# Patient Record
Sex: Female | Born: 1955
Health system: Southern US, Community
[De-identification: ages and names within clinical notes are randomized; demographics above are authoritative.]

## PROBLEM LIST (undated history)

## (undated) DIAGNOSIS — Z87442 Personal history of urinary calculi: Secondary | ICD-10-CM

## (undated) DIAGNOSIS — M3500 Sicca syndrome, unspecified: Secondary | ICD-10-CM

## (undated) DIAGNOSIS — E063 Autoimmune thyroiditis: Secondary | ICD-10-CM

## (undated) DIAGNOSIS — N200 Calculus of kidney: Secondary | ICD-10-CM

## (undated) DIAGNOSIS — M797 Fibromyalgia: Secondary | ICD-10-CM

## (undated) DIAGNOSIS — M069 Rheumatoid arthritis, unspecified: Secondary | ICD-10-CM

## (undated) DIAGNOSIS — IMO0002 Reserved for concepts with insufficient information to code with codable children: Secondary | ICD-10-CM

## (undated) DIAGNOSIS — G43909 Migraine, unspecified, not intractable, without status migrainosus: Secondary | ICD-10-CM

## (undated) DIAGNOSIS — U071 COVID-19: Secondary | ICD-10-CM

## (undated) DIAGNOSIS — E039 Hypothyroidism, unspecified: Secondary | ICD-10-CM

## (undated) DIAGNOSIS — K219 Gastro-esophageal reflux disease without esophagitis: Secondary | ICD-10-CM

## (undated) DIAGNOSIS — M329 Systemic lupus erythematosus, unspecified: Secondary | ICD-10-CM

## (undated) DIAGNOSIS — I729 Aneurysm of unspecified site: Secondary | ICD-10-CM

## (undated) DIAGNOSIS — R002 Palpitations: Secondary | ICD-10-CM

## (undated) DIAGNOSIS — R630 Anorexia: Secondary | ICD-10-CM

## (undated) DIAGNOSIS — G459 Transient cerebral ischemic attack, unspecified: Secondary | ICD-10-CM

## (undated) HISTORY — DX: Anorexia: R63.0

## (undated) HISTORY — DX: Aneurysm of unspecified site: I72.9

## (undated) HISTORY — PX: CHOLECYSTECTOMY: SHX55

## (undated) HISTORY — DX: Sjogren syndrome, unspecified: M35.00

## (undated) HISTORY — PX: ABDOMINAL HYSTERECTOMY: SHX81

## (undated) HISTORY — DX: COVID-19: U07.1

## (undated) HISTORY — DX: Fibromyalgia: M79.7

## (undated) HISTORY — DX: Reserved for concepts with insufficient information to code with codable children: IMO0002

## (undated) HISTORY — DX: Migraine, unspecified, not intractable, without status migrainosus: G43.909

## (undated) HISTORY — DX: Hypothyroidism, unspecified: E03.9

## (undated) HISTORY — DX: Autoimmune thyroiditis: E06.3

## (undated) HISTORY — DX: Rheumatoid arthritis, unspecified: M06.9

## (undated) HISTORY — DX: Systemic lupus erythematosus, unspecified: M32.9

## (undated) HISTORY — PX: OTHER SURGICAL HISTORY: SHX169

## (undated) HISTORY — DX: Calculus of kidney: N20.0

---

## 1988-06-03 HISTORY — PX: CERVICAL FUSION: SHX112

## 1999-09-04 ENCOUNTER — Encounter: Payer: Self-pay | Admitting: Family Medicine

## 1999-09-04 ENCOUNTER — Encounter: Admission: RE | Admit: 1999-09-04 | Discharge: 1999-09-04 | Payer: Self-pay | Admitting: Family Medicine

## 2000-05-02 ENCOUNTER — Encounter (INDEPENDENT_AMBULATORY_CARE_PROVIDER_SITE_OTHER): Payer: Self-pay

## 2000-05-02 ENCOUNTER — Other Ambulatory Visit: Admission: RE | Admit: 2000-05-02 | Discharge: 2000-05-02 | Payer: Self-pay | Admitting: Obstetrics & Gynecology

## 2000-09-24 ENCOUNTER — Encounter: Admission: RE | Admit: 2000-09-24 | Discharge: 2000-09-24 | Payer: Self-pay | Admitting: Family Medicine

## 2000-09-24 ENCOUNTER — Encounter: Payer: Self-pay | Admitting: Family Medicine

## 2000-11-05 ENCOUNTER — Other Ambulatory Visit: Admission: RE | Admit: 2000-11-05 | Discharge: 2000-11-05 | Payer: Self-pay | Admitting: Obstetrics & Gynecology

## 2000-11-05 ENCOUNTER — Encounter (INDEPENDENT_AMBULATORY_CARE_PROVIDER_SITE_OTHER): Payer: Self-pay

## 2000-12-24 ENCOUNTER — Encounter (INDEPENDENT_AMBULATORY_CARE_PROVIDER_SITE_OTHER): Payer: Self-pay

## 2000-12-24 ENCOUNTER — Observation Stay (HOSPITAL_COMMUNITY): Admission: RE | Admit: 2000-12-24 | Discharge: 2000-12-25 | Payer: Self-pay | Admitting: Obstetrics & Gynecology

## 2001-08-14 ENCOUNTER — Encounter: Admission: RE | Admit: 2001-08-14 | Discharge: 2001-08-14 | Payer: Self-pay | Admitting: Family Medicine

## 2001-08-14 ENCOUNTER — Encounter: Payer: Self-pay | Admitting: Family Medicine

## 2001-09-14 ENCOUNTER — Encounter: Admission: RE | Admit: 2001-09-14 | Discharge: 2001-10-28 | Payer: Self-pay | Admitting: Orthopedic Surgery

## 2002-03-30 ENCOUNTER — Encounter: Admission: RE | Admit: 2002-03-30 | Discharge: 2002-03-30 | Payer: Self-pay | Admitting: Neurology

## 2002-03-30 ENCOUNTER — Encounter: Payer: Self-pay | Admitting: Neurology

## 2002-06-03 HISTORY — PX: CERVICAL FUSION: SHX112

## 2002-06-03 HISTORY — PX: LUMBAR MICRODISCECTOMY: SHX99

## 2002-11-16 ENCOUNTER — Encounter: Payer: Self-pay | Admitting: Specialist

## 2002-11-18 ENCOUNTER — Encounter: Payer: Self-pay | Admitting: Specialist

## 2002-11-18 ENCOUNTER — Ambulatory Visit (HOSPITAL_COMMUNITY): Admission: RE | Admit: 2002-11-18 | Discharge: 2002-11-19 | Payer: Self-pay | Admitting: Specialist

## 2003-01-25 ENCOUNTER — Encounter: Payer: Self-pay | Admitting: Specialist

## 2003-01-25 ENCOUNTER — Ambulatory Visit (HOSPITAL_COMMUNITY): Admission: RE | Admit: 2003-01-25 | Discharge: 2003-01-26 | Payer: Self-pay | Admitting: Specialist

## 2003-08-16 ENCOUNTER — Encounter: Admission: RE | Admit: 2003-08-16 | Discharge: 2003-08-16 | Payer: Self-pay | Admitting: Specialist

## 2003-08-21 ENCOUNTER — Encounter: Admission: RE | Admit: 2003-08-21 | Discharge: 2003-08-21 | Payer: Self-pay | Admitting: Specialist

## 2003-08-29 ENCOUNTER — Encounter: Admission: RE | Admit: 2003-08-29 | Discharge: 2003-08-29 | Payer: Self-pay | Admitting: Rheumatology

## 2003-09-15 ENCOUNTER — Encounter (INDEPENDENT_AMBULATORY_CARE_PROVIDER_SITE_OTHER): Payer: Self-pay | Admitting: *Deleted

## 2003-09-15 ENCOUNTER — Ambulatory Visit (HOSPITAL_COMMUNITY): Admission: RE | Admit: 2003-09-15 | Discharge: 2003-09-15 | Payer: Self-pay | Admitting: Endocrinology

## 2003-11-25 ENCOUNTER — Encounter: Admission: RE | Admit: 2003-11-25 | Discharge: 2003-11-25 | Payer: Self-pay | Admitting: Neurology

## 2004-04-03 ENCOUNTER — Encounter: Admission: RE | Admit: 2004-04-03 | Discharge: 2004-04-03 | Payer: Self-pay | Admitting: Endocrinology

## 2004-05-09 ENCOUNTER — Ambulatory Visit (HOSPITAL_COMMUNITY): Admission: RE | Admit: 2004-05-09 | Discharge: 2004-05-09 | Payer: Self-pay | Admitting: Endocrinology

## 2004-06-14 ENCOUNTER — Encounter: Admission: RE | Admit: 2004-06-14 | Discharge: 2004-06-14 | Payer: Self-pay | Admitting: Otolaryngology

## 2004-07-09 ENCOUNTER — Ambulatory Visit (HOSPITAL_COMMUNITY): Admission: RE | Admit: 2004-07-09 | Discharge: 2004-07-09 | Payer: Self-pay | Admitting: *Deleted

## 2004-07-09 ENCOUNTER — Encounter: Payer: Self-pay | Admitting: Family Medicine

## 2004-07-16 ENCOUNTER — Encounter: Admission: RE | Admit: 2004-07-16 | Discharge: 2004-10-14 | Payer: Self-pay | Admitting: Family Medicine

## 2004-10-10 ENCOUNTER — Ambulatory Visit (HOSPITAL_COMMUNITY): Admission: RE | Admit: 2004-10-10 | Discharge: 2004-10-10 | Payer: Self-pay | Admitting: Neurology

## 2004-11-28 ENCOUNTER — Encounter: Admission: RE | Admit: 2004-11-28 | Discharge: 2004-11-28 | Payer: Self-pay | Admitting: Family Medicine

## 2005-02-11 ENCOUNTER — Encounter: Admission: RE | Admit: 2005-02-11 | Discharge: 2005-02-11 | Payer: Self-pay | Admitting: *Deleted

## 2005-11-11 ENCOUNTER — Encounter: Admission: RE | Admit: 2005-11-11 | Discharge: 2005-11-11 | Payer: Self-pay | Admitting: Family Medicine

## 2006-12-18 ENCOUNTER — Encounter (INDEPENDENT_AMBULATORY_CARE_PROVIDER_SITE_OTHER): Payer: Self-pay | Admitting: Family Medicine

## 2006-12-26 ENCOUNTER — Telehealth (INDEPENDENT_AMBULATORY_CARE_PROVIDER_SITE_OTHER): Payer: Self-pay | Admitting: *Deleted

## 2007-09-17 ENCOUNTER — Encounter: Admission: RE | Admit: 2007-09-17 | Discharge: 2007-09-17 | Payer: Self-pay | Admitting: Endocrinology

## 2008-05-02 ENCOUNTER — Ambulatory Visit: Payer: Self-pay | Admitting: Family Medicine

## 2008-05-02 DIAGNOSIS — R630 Anorexia: Secondary | ICD-10-CM

## 2008-05-02 DIAGNOSIS — R1310 Dysphagia, unspecified: Secondary | ICD-10-CM | POA: Insufficient documentation

## 2008-05-02 DIAGNOSIS — E063 Autoimmune thyroiditis: Secondary | ICD-10-CM | POA: Insufficient documentation

## 2008-05-02 DIAGNOSIS — E039 Hypothyroidism, unspecified: Secondary | ICD-10-CM

## 2008-05-02 DIAGNOSIS — R109 Unspecified abdominal pain: Secondary | ICD-10-CM | POA: Insufficient documentation

## 2008-05-02 LAB — CONVERTED CEMR LAB
Albumin: 4.2 g/dL (ref 3.5–5.2)
Alkaline Phosphatase: 46 units/L (ref 39–117)
Bilirubin, Direct: 0.2 mg/dL (ref 0.0–0.3)
Glucose, Bld: 93 mg/dL (ref 70–99)
Potassium: 3.8 meq/L (ref 3.5–5.1)
TSH: 0.55 microintl units/mL (ref 0.35–5.50)
Total Protein: 7.3 g/dL (ref 6.0–8.3)

## 2008-05-03 ENCOUNTER — Encounter (INDEPENDENT_AMBULATORY_CARE_PROVIDER_SITE_OTHER): Payer: Self-pay | Admitting: *Deleted

## 2008-05-04 ENCOUNTER — Encounter (INDEPENDENT_AMBULATORY_CARE_PROVIDER_SITE_OTHER): Payer: Self-pay | Admitting: *Deleted

## 2008-12-22 ENCOUNTER — Encounter: Admission: RE | Admit: 2008-12-22 | Discharge: 2008-12-22 | Payer: Self-pay | Admitting: Neurology

## 2009-09-06 ENCOUNTER — Inpatient Hospital Stay (HOSPITAL_COMMUNITY): Admission: EM | Admit: 2009-09-06 | Discharge: 2009-09-19 | Payer: Self-pay | Admitting: Emergency Medicine

## 2009-10-06 ENCOUNTER — Encounter: Payer: Self-pay | Admitting: Family Medicine

## 2009-12-01 ENCOUNTER — Encounter: Admission: RE | Admit: 2009-12-01 | Discharge: 2009-12-01 | Payer: Self-pay | Admitting: Neurology

## 2010-06-24 ENCOUNTER — Encounter: Payer: Self-pay | Admitting: *Deleted

## 2010-07-03 NOTE — Letter (Signed)
Summary: [facility]   Imported By: Lanelle Bal 11/04/2009 08:57:07  _____________________________________________________________________  External Attachment:    Type:   Image     Comment:   External Document

## 2010-08-21 LAB — BASIC METABOLIC PANEL
BUN: 12 mg/dL (ref 6–23)
BUN: 14 mg/dL (ref 6–23)
BUN: 16 mg/dL (ref 6–23)
CO2: 28 mEq/L (ref 19–32)
CO2: 29 mEq/L (ref 19–32)
Chloride: 100 mEq/L (ref 96–112)
Creatinine, Ser: 0.55 mg/dL (ref 0.4–1.2)
Creatinine, Ser: 0.58 mg/dL (ref 0.4–1.2)
Creatinine, Ser: 0.62 mg/dL (ref 0.4–1.2)
GFR calc Af Amer: >60 mL/min (ref >60)
GFR calc Af Amer: >60 mL/min (ref >60)
GFR calc non Af Amer: >60 mL/min (ref >60)
GFR calc non Af Amer: >60 mL/min (ref >60)
Glucose, Bld: 103 mg/dL — ABNORMAL HIGH (ref 70–99)
Glucose, Bld: 105 mg/dL — ABNORMAL HIGH (ref 70–99)
Potassium: 3.8 mEq/L (ref 3.5–5.1)
Sodium: 136 mEq/L (ref 135–145)

## 2010-08-21 LAB — GLUCOSE, CAPILLARY
Glucose-Capillary: 110 mg/dL — ABNORMAL HIGH (ref 70–99)
Glucose-Capillary: 110 mg/dL — ABNORMAL HIGH (ref 70–99)
Glucose-Capillary: 116 mg/dL — ABNORMAL HIGH (ref 70–99)
Glucose-Capillary: 120 mg/dL — ABNORMAL HIGH (ref 70–99)
Glucose-Capillary: 121 mg/dL — ABNORMAL HIGH (ref 70–99)

## 2010-08-21 LAB — PHOSPHORUS
Phosphorus: 4.1 mg/dL (ref 2.3–4.6)
Phosphorus: 4.2 mg/dL (ref 2.3–4.6)

## 2010-08-21 LAB — COMPREHENSIVE METABOLIC PANEL
AST: 92 U/L — ABNORMAL HIGH (ref 0–37)
Albumin: 2.5 g/dL — ABNORMAL LOW (ref 3.5–5.2)
GFR calc Af Amer: >60 mL/min (ref >60)
GFR calc non Af Amer: >60 mL/min (ref >60)
Sodium: 135 mEq/L (ref 135–145)

## 2010-08-21 LAB — MAGNESIUM
Magnesium: 2.2 mg/dL (ref 1.5–2.5)
Magnesium: 2.3 mg/dL (ref 1.5–2.5)

## 2010-08-21 LAB — PREALBUMIN: Prealbumin: 14.7 mg/dL — ABNORMAL LOW (ref 18.0–45.0)

## 2010-08-22 LAB — GLUCOSE, CAPILLARY
Glucose-Capillary: 103 mg/dL — ABNORMAL HIGH (ref 70–99)
Glucose-Capillary: 120 mg/dL — ABNORMAL HIGH (ref 70–99)
Glucose-Capillary: 121 mg/dL — ABNORMAL HIGH (ref 70–99)
Glucose-Capillary: 123 mg/dL — ABNORMAL HIGH (ref 70–99)
Glucose-Capillary: 127 mg/dL — ABNORMAL HIGH (ref 70–99)
Glucose-Capillary: 131 mg/dL — ABNORMAL HIGH (ref 70–99)
Glucose-Capillary: 133 mg/dL — ABNORMAL HIGH (ref 70–99)
Glucose-Capillary: 135 mg/dL — ABNORMAL HIGH (ref 70–99)
Glucose-Capillary: 136 mg/dL — ABNORMAL HIGH (ref 70–99)
Glucose-Capillary: 147 mg/dL — ABNORMAL HIGH (ref 70–99)
Glucose-Capillary: 148 mg/dL — ABNORMAL HIGH (ref 70–99)
Glucose-Capillary: 153 mg/dL — ABNORMAL HIGH (ref 70–99)
Glucose-Capillary: 159 mg/dL — ABNORMAL HIGH (ref 70–99)
Glucose-Capillary: 167 mg/dL — ABNORMAL HIGH (ref 70–99)
Glucose-Capillary: 171 mg/dL — ABNORMAL HIGH (ref 70–99)
Glucose-Capillary: 174 mg/dL — ABNORMAL HIGH (ref 70–99)

## 2010-08-22 LAB — URINALYSIS, ROUTINE W REFLEX MICROSCOPIC
Glucose, UA: 100 mg/dL — AB
Specific Gravity, Urine: 1.027 (ref 1.005–1.030)
Urobilinogen, UA: 0.2 mg/dL (ref 0.0–1.0)
pH: 6 (ref 5.0–8.0)

## 2010-08-22 LAB — CBC
HCT: 32.7 % — ABNORMAL LOW (ref 36.0–46.0)
MCHC: 35 g/dL (ref 30.0–36.0)
MCV: 97.6 fL (ref 78.0–100.0)
Platelets: 153 10*3/uL (ref 150–400)
Platelets: 177 10*3/uL (ref 150–400)
Platelets: 202 10*3/uL (ref 150–400)
RDW: 12.8 % (ref 11.5–15.5)
RDW: 13 % (ref 11.5–15.5)
WBC: 10.5 10*3/uL (ref 4.0–10.5)

## 2010-08-22 LAB — TRIGLYCERIDES: Triglycerides: 83 mg/dL (ref <150)

## 2010-08-22 LAB — BASIC METABOLIC PANEL
CO2: 29 mEq/L (ref 19–32)
Calcium: 8.4 mg/dL (ref 8.4–10.5)
Calcium: 8.6 mg/dL (ref 8.4–10.5)
Chloride: 108 mEq/L (ref 96–112)
Creatinine, Ser: 0.64 mg/dL (ref 0.4–1.2)
Creatinine, Ser: 0.77 mg/dL (ref 0.4–1.2)
GFR calc Af Amer: >60 mL/min (ref >60)
GFR calc Af Amer: >60 mL/min (ref >60)
GFR calc non Af Amer: >60 mL/min (ref >60)
GFR calc non Af Amer: >60 mL/min (ref >60)
GFR calc non Af Amer: >60 mL/min (ref >60)
Glucose, Bld: 158 mg/dL — ABNORMAL HIGH (ref 70–99)
Potassium: 3.1 mEq/L — ABNORMAL LOW (ref 3.5–5.1)
Sodium: 140 mEq/L (ref 135–145)
Sodium: 143 mEq/L (ref 135–145)

## 2010-08-22 LAB — COMPREHENSIVE METABOLIC PANEL
ALT: 11 U/L (ref 0–35)
ALT: 30 U/L (ref 0–35)
Albumin: 2.4 g/dL — ABNORMAL LOW (ref 3.5–5.2)
Albumin: 2.6 g/dL — ABNORMAL LOW (ref 3.5–5.2)
Alkaline Phosphatase: 31 U/L — ABNORMAL LOW (ref 39–117)
Alkaline Phosphatase: 41 U/L (ref 39–117)
BUN: 3 mg/dL — ABNORMAL LOW (ref 6–23)
BUN: 9 mg/dL (ref 6–23)
CO2: 29 mEq/L (ref 19–32)
Calcium: 8.2 mg/dL — ABNORMAL LOW (ref 8.4–10.5)
Calcium: 8.4 mg/dL (ref 8.4–10.5)
GFR calc Af Amer: >60 mL/min (ref >60)
GFR calc non Af Amer: >60 mL/min (ref >60)
Glucose, Bld: 112 mg/dL — ABNORMAL HIGH (ref 70–99)
Glucose, Bld: 127 mg/dL — ABNORMAL HIGH (ref 70–99)
Potassium: 3.1 mEq/L — ABNORMAL LOW (ref 3.5–5.1)
Potassium: 3.2 mEq/L — ABNORMAL LOW (ref 3.5–5.1)
Sodium: 134 mEq/L — ABNORMAL LOW (ref 135–145)
Sodium: 139 mEq/L (ref 135–145)
Sodium: 145 mEq/L (ref 135–145)
Total Protein: 5 g/dL — ABNORMAL LOW (ref 6.0–8.3)
Total Protein: 5.2 g/dL — ABNORMAL LOW (ref 6.0–8.3)
Total Protein: 5.9 g/dL — ABNORMAL LOW (ref 6.0–8.3)

## 2010-08-22 LAB — DIFFERENTIAL
Basophils Relative: 0 % (ref 0–1)
Basophils Relative: 0 % (ref 0–1)
Eosinophils Absolute: 0 10*3/uL (ref 0.0–0.7)
Lymphs Abs: 0.6 10*3/uL — ABNORMAL LOW (ref 0.7–4.0)
Lymphs Abs: 1 10*3/uL (ref 0.7–4.0)
Monocytes Absolute: 0.7 10*3/uL (ref 0.1–1.0)
Monocytes Relative: 8 % (ref 3–12)
Neutro Abs: 5.8 10*3/uL (ref 1.7–7.7)
Neutro Abs: 9.7 10*3/uL — ABNORMAL HIGH (ref 1.7–7.7)
Neutrophils Relative %: 74 % (ref 43–77)
Neutrophils Relative %: 92 % — ABNORMAL HIGH (ref 43–77)

## 2010-08-22 LAB — POCT I-STAT, CHEM 8
BUN: 11 mg/dL (ref 6–23)
Chloride: 105 mEq/L (ref 96–112)
Creatinine, Ser: 0.7 mg/dL (ref 0.4–1.2)
Glucose, Bld: 143 mg/dL — ABNORMAL HIGH (ref 70–99)
Potassium: 3.8 mEq/L (ref 3.5–5.1)
Sodium: 139 mEq/L (ref 135–145)

## 2010-08-22 LAB — CHOLESTEROL, TOTAL: Cholesterol: 105 mg/dL (ref 0–200)

## 2010-08-22 LAB — PHOSPHORUS
Phosphorus: 1.4 mg/dL — ABNORMAL LOW (ref 2.3–4.6)
Phosphorus: 3.7 mg/dL (ref 2.3–4.6)
Phosphorus: 3.9 mg/dL (ref 2.3–4.6)

## 2010-08-22 LAB — PREALBUMIN: Prealbumin: 7.9 mg/dL — ABNORMAL LOW (ref 18.0–45.0)

## 2010-08-22 LAB — URINE MICROSCOPIC-ADD ON

## 2010-08-22 LAB — MAGNESIUM: Magnesium: 2.4 mg/dL (ref 1.5–2.5)

## 2010-08-22 LAB — TSH: TSH: 2.533 u[IU]/mL (ref 0.350–4.500)

## 2010-08-22 LAB — HEMOGLOBIN A1C
Hgb A1c MFr Bld: 5.9 % (ref 4.6–6.1)
Mean Plasma Glucose: 123 mg/dL

## 2010-10-19 NOTE — Op Note (Signed)
Becky Boyd, Becky Boyd                           ACCOUNT NO.:  [id number]   MEDICAL RECORD NO.:  [id number]                   PATIENT TYPE:  OIB   LOCATION:  2550                                 FACILITY:  [facility]   PHYSICIAN:  Kerrin Champagne, M.D.                DATE OF BIRTH:  Jul 24, 1955   DATE OF PROCEDURE:  01/25/2003  DATE OF DISCHARGE:                                 OPERATIVE REPORT   PREOPERATIVE DIAGNOSIS:  Herniated nucleus pulposus C4-5 above previous C5-6-  C6-7 fusion.   POSTOPERATIVE DIAGNOSIS:  C4-5 herniated nucleus pulposus with degenerative  disk changes above the previous two level solid fusion C5 to C7.   OPERATION PERFORMED:  Anterior cervical diskectomy and fusion at C4-5  utilizing right iliac crest bone graft harvested through a separate  incision.  Internal fixation at the C4-5 level using a 22 mm Depuy locking  plate with 13 mm screws.   SURGEON:  Kerrin Champagne, M.D.   ASSISTANT:  Ronnell Guadalajara, M.D.   ANESTHESIA:  General orotracheal anesthesia supplemented with local  infiltration with Marcaine 0.5% with 1:200,000 epinephrine.   ANESTHESIA:  1. Judie Petit, M.D.  2. Zenon Mayo, MD.   ESTIMATED BLOOD LOSS:  15mL.   DRAINS:  TLS drain, left neck __________.   INDICATIONS FOR PROCEDURE:  The patient is a 55 year old female who has been  followed now for well over a year and a half following injury to both her  neck and lower back.  She has undergone conservative management for both  areas utilizing pain management, has had persistent discomfort and  disfunction since the time of her initial injuries in the early part of  2003.  The patient has undergone conservative management with therapy and  epidural  steroids, selective nerve root blocks in regard to her neck.  MRI  studies have demonstrated disk protrusion at C4-5 above the previous C4-5,  C5-6 fusion levels.  Because of persistent pain and discomfort, findings of  central  disk protrusion with canal narrowing at this segment, it is felt  that surgical intervention is the best method to try to diminish her  discomfort and improve function.   INTRAOPERATIVE FINDINGS:  The patient was found to have central protrusion  of the disk at the C4-5 level.  The procedure proceeded uneventfully.   DESCRIPTION OF PROCEDURE:  After adequate general anesthesia, the patient in  beach chair position, the arms at the sides, well padded.  TED hose on both  lower extremities to prevent DVT, standard preoperative antibiotics.  Bump  under the right buttocks,  all pressure points well padded.  The neck in  slight extension with a [place] horseshoe padded.  Five pounds cervical  halter traction.  The patient had standard prep with DuraPrep solution, the  anterior neck and right iliac crest draped in the usual manner.  Iodine and  Vi-drapes were  used over the right iliac crest and anterior neck region.  The incision left neck in the old previous incision scar through the skin  and subcutaneous layers approximately 3.5 to 4 cm in length through the skin  and subcutaneous layers down to the platysma layer.  This then incised in  line with the skin incision and spread.  Blunt dissection then used to  develop the interval between the trachea and esophagus medially and the  carotid sheath laterally.  Internal jugular vein noted as well as carotid  artery vessels noted.  These were carefully protected.  The longus colli  muscle identified laterally and then incised using a 15 blade scalpel.  Key  elevator then used to elevate the periosteum and prevertebral fascial layers  over the anterior aspect of the cervical spine at the expected C4-5 level  above the previous two-level fusion.  Bleeders controlled using bipolar  electrocautery.  The disk space felt to be present and a spinal needle  inserted with the sheath intact only allowing a centimeter needle to  protrude and be placed  within the disk space.  Intraoperative lateral  radiograph demonstrated the needle present at the C4-5 level.  Hand held  Clowards and [employer] then used for careful retraction.  The spinal needle  then removed under direct observation using loop magnification and head  light.  15 blade scalpel used to incise the disk and excise a portion of the  disk over the anterior aspect of the cervical spine at the C4-5 level for  its continued identification.  Key elevator was then used to elevate the  longus colli muscle on both sides and a McCullough retractor inserted  beneath the medial border of the longus colli muscle.  A 14 mm screw post  inserted into the vertebral body of both C4 and C5.  Distraction obtained  across this disk space.  A 15 blade scalpel used to incise the disk space  and the disk was then excised using pituitary rongeurs.  Curettage performed  of the end plates removing the cartilaginous end plates over the inferior  aspect of C4, superior aspect of C5.  Disk was felt to be degenerated and  noted to be protruding posteriorly.  Operating room microscope draped  sterilely, brought into the field.  Under direct observation, the posterior  aspect of the disk was then excised using pituitary rongeurs as well as  microcurets.  The 1 and 2 mm Kerrisons used to remove posterior lip  osteophytes over the posterior aspect of the C4-5 level decompressing the  spinal canal quite nicely.  Posterior longitudinal ligament was left intact  as disk was noted to be protruding beneath the annular fibers but not  beneath the posterior longitudinal ligament.  The foramina were then  decompressed using a 2 mm Kerrison excising disk material on the right side  found to be present and extruding into the foramen on the right side.  This  was relieved.  Irrigation was then performed.  A high speed bur was used to remove the anterior lip osteophytes and also further debride the end plates,  both the  inferior aspects of C4 and superior aspects of C5.  The height of  the intervertebral disk space measured using a sounder, 7 mm was chosen.  Separate incision of the right iliac crest was used for obtaining bone  graft.  Incision approximately 2 inches in length through the skin and  subcutaneous layers after infiltration of Marcaine carried directly to  the  anterolateral aspect of the iliac crest superficially.  An incision carried  down to the bone and then subperiosteal dissection carried medial and  lateral.  A retractor used to retract medial structures and also lateral  structures and a dual oscillating saw set at 7mm then used to make the cut  into the iliac crest.  This was divided across the space using a quarter  inch osteotome.  Bone wax applied to bleeding cancellous bone surfaces to  obtain hemostasis.  Irrigation performed.  Bone graft carefully tapered to  dimensions of the intervertebral disk space.  The depth of the  intervertebral disk space was measured at 15 mm using a Cloward depth gauge.  The depth of the bone graft was at 12 mm.  Graft carefully keyed anteriorly  and then impacted into place with the distractor in place.  The distraction  then removed.  Screw posts removed.  Bone wax applied to the bleeding screw  post holes.  Five pound cervical halter traction released.  A 22 mm DePuy  locking plate was then placed over the anterior aspect of the cervical spine  following careful debridement of the anterior lip osteophytes and smoothing  the anterior cervical spine in this region to accept plate.  Soft tissue  retraction performed using hand held Cloward's to ensure that soft tissue  was carefully protected both medial and lateral.  Plate then carefully  pinned to the anterior aspect of the cervical spine using the temporary  retaining pins.  Drill holes then placed using a 14 mm drill and 13 mm  screws placed, first the right superior one, then the right lower one,  then  the two left screws both superiorly and inferiorly.  Intraoperative lateral  radiograph demonstrated placement of screws in good position and alignment.  Bone graft without evidence of retropulsion.  The radiographs noted to be  slightly oblique as the facets were overlapping bilaterally.  Screws were  felt to be in excellent position and alignment with no evidence of  impingement on the spinal canal.  Irrigation was then performed.   Careful inspection of the esophagus demonstrated no abnormalities.  A very  minimal amount of oozing from soft tissue from previous scar and previous  surgeries.  A 7 French TLS drain was then placed in the depth of the  incision exiting inferior to the anterior incision scar.  This was sewn in  place with a 4-0 nylon suture.  Carefully, the platysma layer reapproximated  with interrupted 3-0 Vicryl sutures in inverted fashion.  Deep subcutaneous layers approximated with interrupted 3-0 Vicryl suture and skin closed with  a running subcu stitch of 4-0 Vicryl.  Tincture of benzoin and Steri-Strips  applied.  4 x 4's fixed to the skin with Hypafix tape, TLS drain charged to  a red top tube.  Right iliac crest bone graft harvest site was then closed  using irrigation first then bone wax applied to bleeding cancellous bone  surfaces, Gelfoam.  The fascial layers overlying the iliac crest  reapproximated with interrupted #1 Vicryl sutures, more superficial layers  with interrupted 2-0 and 3-0 Vicryl suture and the skin closed with running  subcutaneous stitch of 4-0 Vicryl.  Tincture of benzoin and Steri-Strips  applied to the area and 4 x 4 fixed to the skin with Hypafix tape.  Philadelphia collar then applied to the neck.  The patient was then reacted  and extubated and returned to the recovery room in satisfactory condition.  Sponge and instrument counts were correct.                                                Kerrin Champagne, M.D.    Myra Rude   D:  01/25/2003  T:  01/25/2003  Job:  [id number]

## 2010-10-19 NOTE — Op Note (Signed)
Becky Boyd, Becky Boyd                           ACCOUNT NO.:  [id number]   MEDICAL RECORD NO.:  [id number]                   PATIENT TYPE:  OIB   LOCATION:  2869                                 FACILITY:  [facility]   PHYSICIAN:  Kerrin Champagne, M.D.                DATE OF BIRTH:  02-07-1956   DATE OF PROCEDURE:  11/18/2002  DATE OF DISCHARGE:                                 OPERATIVE REPORT   PREOPERATIVE DIAGNOSIS:  Central disk herniation at L5-S1 with extension to  the left side.   POSTOPERATIVE DIAGNOSIS:  Central disk herniation at L5-S1 with extension to  the left side.   PROCEDURE:  Bilateral L5-S1 microdiskectomy utilizing the operating room  microscope.   SURGEON:  Kerrin Champagne, M.D.   ASSISTANT:  Wende Neighbors, P.A.   ANESTHESIA:  GOT by Dr. Sondra Come.   ESTIMATED BLOOD LOSS:  Less than 10 mL.   DRAINS:  None.   BRIEF CLINICAL HISTORY:  The patient is a 55 year old female who sustained  an injury to her back well over a year ago and she reports persistent back  pain since that time with radiation into her left lower extremity.  The  patient has undergone selective nerve root blocks and conservative  management and apparently with worsening back pain.  She has been in pain  management.  An MRI shows a very small disk protrusion at L5-S1 without  significant nerve root compression. The most recent study indicates a slight  increase in protrusion with left S1 nerve root compression.  She is brought  to the operating room to undergo L5-S1 microdiskectomy on the left side with  possible diskectomy on the right side.   INTRAOPERATIVE FINDINGS:  Disk protrusion central and leftward L5-S1  affecting primarily the left S1 nerve root.   DESCRIPTION OF PROCEDURE:  After adequate general anesthesia the patient was  placed in the knee/chest position on an Andrews frame, standard preoperative  antibiotics, standard prep with Duraprep solution, draped in the usual  manner and  an iodine Vi-Drape was used.  A spinal needle was placed at the  expected L5-S1 level.  The upper spine needle was noted at the upper end of  S1 and the lower end of L5.  An incision was made varied towards the upper  spinal needle in the midline approximately an inch and a half in length  through the skin and subcutaneous layer down to the lumbodorsal fascia. This  was incised on both sides of the spinous processes of L5 and S1 and placed  on the spinous process of S1.  An intraoperative lateral radiograph  demonstrated the clamp on the spinous process of S1.  A Cobb was then used  to elevate the paralumbar muscles off the posterior aspect of the  interlaminar space at the L5-S1 level.  A McCullough retractor was then  inserted on the right side.  A small Leksell was used to remove a small  portion of the inferior aspect of the lamina on the right side and left side  at L5.  A 3 mm Kerrison was then used to carefully detach the inferior  attachment of the ligamentum flavum at the L5 level.  This was done  bilaterally and on the medial aspect of the facet and the lamina was  similarly detached off the superior aspect of the lamina of S1 using 3 mm  Kerrison.  A foraminotomy was performed over the S1 nerve root on both  sides.  The thecal sac was mobilized on the right side and then on the left  side.  Bleeders were controlled using bipolar electrocautery.   The disk was incised on the right side as it shows a very large portion of  the disk protrusion to be present here.  This was incised on the right side  initially longitudinally and in a sagittal direction using a 15 blade  scalpel.  A straight up biting down biting pituitary was used to excise the  disk on the right side.  The left side was examined and found to still be  protruding here as well.  An incision was made on this side longitudinally  and a sagittal plane and then disk material removed from here.  A  subligamentous disk  protrusion was noted to be present central and leftward.  This was excised using the Person nerve hook and pituitary rongeurs used to  debride the disk of loose fragment of disk material present felt to be at  risk of further rupture.  Following debridement of the disk an irrigation  was performed.  Careful exploration using a high step neuro probe  demonstrated no evidence of neural foraminal narrowing of either S1 nerve  root or L5 nerve root. The posterior aspect of the disk space demonstrated  no further compression of the anterior aspect of the thecal sac.  Following  further irrigation and hemostasis using Gelfoam Thrombin soaked excess  Gelfoam was removed.  It was removed from the spinal canal in its entirely  on both the right and left side.  Re-exploration of the right disk after  excision on the left side demonstrated a small amount of disk material  remaining and this was removed.  Following this the thecal sac was allowed  to return to its normal position. A small portion of Gelfoam was placed over  the posterior aspect of the laminotomy defect on the right side and left  side.  The paralumbar muscles were allowed to fall back into place. The  lumbodorsal fascia was approximated in the midline with interrupted 0 Vicryl  sutures.  The deep subcutaneous layer was approximated with interrupted 0  Vicryl sutures and the more superficial layers with interrupted 2-0 Vicryl  suture and the skin closed with a running subcutaneous stitch of 4-0 Vicryl.  Tincture of Benzoin and Steri-Strips were applied.  A __________ dressing  was applied to the skin.  The patient was then returned to the supine  position, reactivated, extubated and returned to the recovery room in  satisfactory condition.   INTRAOPERATIVE FINDINGS:  1. Central and left sided disk protrusion at L5 and S1.  2. Left S1 nerve root compression.  Kerrin Champagne, M.D.     JEN/MEDQ  D:  11/18/2002  T:  11/20/2002  Job:  [id number]

## 2010-10-19 NOTE — Op Note (Signed)
[facility]  Patient:    Becky Boyd, Becky Boyd                          MRN: [id number] Proc. Date: 12/24/00 Adm. Date:  12/24/00 Attending:  Genia Del, M.D.                           Operative Report  DATE OF BIRTH:                [birth year]  PREOPERATIVE DIAGNOSES:       1. Menometrorrhagia associated with uterine                                  myoma, refractory to medical treatment.                               2. Chronic right pelvic pain.  POSTOPERATIVE DIAGNOSES:      1. Menometrorrhagia associated with uterine                                  myoma, refractory to medical treatment.                               2. Chronic right pelvic pain.                               3. Mild pelvic endometriosis.  OPERATION:                    Laparoscopy-assisted vaginal hysterectomy plus                               right salpingo-oophorectomy.  SURGEON:                      Genia Del, M.D.  ASSISTANT:                    Cordelia Pen A. Rosalio Macadamia, M.D.  ANESTHESIOLOGIST:             Ellison Hughs., M.D.  DESCRIPTION OF PROCEDURE:     Under general anesthesia with endotracheal intubation, the patient is in the lithotomy position.  He is prepped with Betadine in the abdominal, suprapubic, vulva, and vaginal areas.  The bladder catheter is inserted and the patient is draped as usual.  The vaginal exam reveals an anteverted uterus, corresponding to about [redacted] weeks gestation, mobile.  No adnexal mass.  The speculum is inserted.  The tenaculum and uterine cannula is applied.  We then removed the speculum.  Abdominally, an infraumbilical incision was made with the scalpel over 10 mm under the umbilicus.  We then entered the Veress needle while raising the abdominal wall.  Security tests are done and the pneumoperitoneum is created with 2.5 L of CO2, reaching a pressure of 15 mmHg.  We removed the Veress needle.  We then inserted the  trocars with the laparoscope.  We inspected the abdominopelvic cavity.  The uterus is enlarged, but regular in shape.  Only intramural myomas are present.  The size corresponds to about [redacted] weeks gestation.  Both tubes present evidence of previous tubal sterilization.  The right ovary is normal in shape and appearance.  The left ovary is normal in shape and appearance with a very small ________ cyst, less than 2 cm.  Pelvic endometriosis is present with one lesion seen on the right of anterior ______.  This measured about 1-2 cm and is deep with some retraction of the peritoneum.  A second incision was made into the right iliac area and left iliac area with the scalpel over 5 mm.  The 5 mm trocars are inserted on each side under direct vision.  We then inserted an atraumatic clamp in the tripolar.  We visualized the ureters on each side which are in normal anatomic location.  We started on the left side, keeping the ovary on that side.  We cauterized and cut extensively the round ligament, the tube, the utero-ovarian ligament, and go down close to the uterus on the broad ligament.  We stopped before the uterine artery.  We then switched the instruments and go on the right side where we cauterized and cut the round ligament, then the infundibulopelvic ligament, and then reached the broad ligaments, and go down close to the uterus, stopping before the uterine artery on that side as well.  We then opened the anterior peritoneum and reclined the bladder downwards. Hemostasis is adequate.  We therefore removed the instruments, leave in the trocars in place, and cover.  We changed the patients position to proceed with vaginal time.  Vaginally, the weighted speculum was inserted.  The cervix was grasped with two [place] tenaculums.  We then infiltrate with Xylocaine 1% with epinephrine all around the cervix.  We made a circular incision with the scalpel around the cervix at the junction of the  vagina.  We then pushed the vagina down to cervix with the 4 x 4 gauze anteriorly and posteriorly. The posterior peritoneum is opened and the long weighted speculum is inserted. Anteriorly, the peritoneum was already open.  We clamped with curved Heaney and cut with Mayo scissors and sutured with Vicryl 0.  The cardinal ligaments on each side with the uterosacral ligaments.  We then proceeded upward on each side, clamping, cutting, and suturing the uterine arteries.  We continued until we reached the point where the hysterectomy had stopped laparoscopically.  We removed the uterus with the right ovary and tube in one piece.  It is sent to pathology.  The pedicles are verified and hemostasis is good.  We then proceeded with a locked running suture with Vicryl 0 on the posterior vaginal wall to complete hemostasis.  Suspension is then achieved by a suture with Vicryl 0, taking the anterior vaginal vault with the peritoneum, the uterosacral ligament and the peritoneum with posterior vaginal vault on the left side and same way on the right side.  We then closed with X-stitches with Vicryl 0 the vaginal vault from anterior to posterior, and the two uterosacral ligaments are attached together in the midline.  We then secured our suspensory sutures on each side.  Hemostasis is adequate.  We removed the vaginal instruments and go back with laparoscopic time to confirm good hemostasis.  We recreated the peritoneum, inserted the Nezhat and irrigated and suctioned the abdominopelvic cavity.  Hemostasis was completed on the right side with the bipolar on a very small bleeder at the level of the peritoneum. Hemostasis is good.  We ruptured the small left ovarian cyst.  Only clear fluid is present at this level.  No endometriosis is seen.  We therefore removed the instruments, evacuated the CO2.  We closed the two iliac incisions with Monocryl 4-0 at the skin.  We closed the infraumbilical incision  with Vicryl 0 at the aponeurosis, and then closed the skin with Monocryl 4-0. Steri-Strips are then applied.  The count of instruments and gauzes is complete x 2.  The estimated blood loss  was 200 cc.  No complication occurred and the patient was transferred to the recovery room in good condition. DD:  12/24/00 TD:  12/25/00 Job: [id number]

## 2010-10-25 ENCOUNTER — Ambulatory Visit (INDEPENDENT_AMBULATORY_CARE_PROVIDER_SITE_OTHER): Payer: PRIVATE HEALTH INSURANCE | Admitting: Family Medicine

## 2010-10-25 VITALS — BP 100/70 | HR 76 | Temp 97.8°F | Wt 129.0 lb

## 2010-10-25 DIAGNOSIS — G459 Transient cerebral ischemic attack, unspecified: Secondary | ICD-10-CM | POA: Insufficient documentation

## 2010-10-25 NOTE — Patient Instructions (Signed)
We'll notify you of your lab results and your imaging appts Schedule an appt w/ Dr Vela Prose for next week PROMISE that if you have any other symptoms- i don't care how insignificant they seem- you will go to the ER Hang in there!

## 2010-10-25 NOTE — Progress Notes (Signed)
  Subjective:    Patient ID: Becky Boyd, female    DOB: Feb 10, 1956, 55 y.o.   MRN: [id number]  HPI Last night pt developed pain in her L shoulder while in the car.  Pain travelled up into L neck, ear, jaw, tongue and was unable to speak.  Pt reports she felt her face was distorted.  Episode lasted 10-15 seconds before easing off and resolved.  Pain in neck lasted 2 hrs.  Also had pain in L rib in mid-axillary line that was present for a few hrs and completely resolved.  Today has had a 'few twinges of neck pain' but 'nothing like last night'.  On Baclofen per neuro, on estrogen per GYN (Becky Boyd).  Not a smoker.  Sees Dr Becky Boyd.  Will also have perioral cyanosis, blurry vision, dizziness (vertigo) w/ turning head/position changes.   Review of Systems For ROS see HPI     Objective:   Physical Exam  Constitutional: She is oriented to person, place, and time. She appears well-developed and well-nourished. No distress.  HENT:  Head: Normocephalic and atraumatic.  Eyes: Conjunctivae and EOM are normal. Pupils are equal, round, and reactive to light.  Neck: Normal range of motion. Neck supple.  Cardiovascular: Normal rate, regular rhythm, normal heart sounds and intact distal pulses.   Pulmonary/Chest: Effort normal and breath sounds normal. No respiratory distress. She has no wheezes.  Abdominal: Soft. Bowel sounds are normal. She exhibits no distension. There is no tenderness. There is no rebound.  Neurological: She is alert and oriented to person, place, and time. She has normal reflexes. No cranial nerve deficit. Coordination normal.       Strength 4/5 on L, 5/5 on R          Assessment & Plan:

## 2010-10-26 ENCOUNTER — Encounter: Payer: Self-pay | Admitting: *Deleted

## 2010-10-26 ENCOUNTER — Ambulatory Visit
Admission: RE | Admit: 2010-10-26 | Discharge: 2010-10-26 | Disposition: A | Discharge status: Home / Self Care | Payer: PRIVATE HEALTH INSURANCE | Source: Ambulatory Visit | Attending: Family Medicine | Admitting: Family Medicine

## 2010-10-26 ENCOUNTER — Telehealth: Payer: Self-pay

## 2010-10-26 DIAGNOSIS — G459 Transient cerebral ischemic attack, unspecified: Secondary | ICD-10-CM

## 2010-10-26 LAB — CBC WITH DIFFERENTIAL/PLATELET
Basophils Absolute: 0 10*3/uL (ref 0.0–0.1)
Eosinophils Absolute: 0.3 10*3/uL (ref 0.0–0.7)
Lymphocytes Relative: 26.7 % (ref 12.0–46.0)
MCHC: 33.8 g/dL (ref 30.0–36.0)
Monocytes Relative: 3.1 % (ref 3.0–12.0)
Neutrophils Relative %: 65.2 % (ref 43.0–77.0)
RBC: 3.65 Mil/uL — ABNORMAL LOW (ref 3.87–5.11)
RDW: 13.4 % (ref 11.5–14.6)

## 2010-10-26 MED ORDER — GADOBENATE DIMEGLUMINE 529 MG/ML IV SOLN
12.0000 mL | Freq: Once | INTRAVENOUS | Status: AC | PRN
  Administered 2010-10-26: 12 mL via INTRAVENOUS

## 2010-10-26 NOTE — Telephone Encounter (Signed)
Pt is scheduled for 3:00 pm today would like clarification on MR Angiogram of neck w/o contrast says this test is usually done w/ contrast just wanted to clarify since pt would be getting contrast for other test anyway.

## 2010-10-26 NOTE — Telephone Encounter (Signed)
Test already done without contrast as per order in system if any further testing is needed it will need to be repeated.

## 2010-10-26 NOTE — Telephone Encounter (Signed)
Spoke w/ radiologist yesterday and explained that i was looking to r/o CVA/TIA and ordered what he told me.  If i heard him incorrectly and it should be performed w/ contrast i can change the order.  But that is what the study is for.

## 2010-10-31 LAB — BASIC METABOLIC PANEL
CO2: 28 mEq/L (ref 19–32)
Calcium: 9.6 mg/dL (ref 8.4–10.5)
Potassium: 4.2 mEq/L (ref 3.5–5.1)
Sodium: 146 mEq/L — ABNORMAL HIGH (ref 135–145)

## 2010-11-04 ENCOUNTER — Encounter: Payer: Self-pay | Admitting: Family Medicine

## 2010-11-04 NOTE — Assessment & Plan Note (Addendum)
Pt's sxs sound consistent w/ TIA.  Stressed importance of seeking medical attention if she has similar sxs.  Will proceed w/ MRI/MRA as pt's neurologist is not available and she will follow up w/ him when he returns next week.  EKG shows no arrhythmia.  Neuro exam still shows small deficits in strength and sensation on L.  Reviewed supportive care and red flags that should prompt return.  Pt expressed understanding and is in agreement w/ plan.  Total time spent w/ pt, contacting neuro office, and speaking w/ radiology regarding which tests to order, >45 minutes

## 2010-11-06 ENCOUNTER — Ambulatory Visit (HOSPITAL_COMMUNITY): Payer: PRIVATE HEALTH INSURANCE | Attending: Family Medicine | Admitting: Radiology

## 2010-11-06 DIAGNOSIS — R4701 Aphasia: Secondary | ICD-10-CM | POA: Insufficient documentation

## 2010-11-06 DIAGNOSIS — R209 Unspecified disturbances of skin sensation: Secondary | ICD-10-CM | POA: Insufficient documentation

## 2010-11-06 DIAGNOSIS — I059 Rheumatic mitral valve disease, unspecified: Secondary | ICD-10-CM | POA: Insufficient documentation

## 2010-11-06 DIAGNOSIS — G459 Transient cerebral ischemic attack, unspecified: Secondary | ICD-10-CM | POA: Insufficient documentation

## 2010-11-06 DIAGNOSIS — R42 Dizziness and giddiness: Secondary | ICD-10-CM | POA: Insufficient documentation

## 2010-11-06 DIAGNOSIS — I079 Rheumatic tricuspid valve disease, unspecified: Secondary | ICD-10-CM | POA: Insufficient documentation

## 2010-11-07 ENCOUNTER — Encounter: Payer: Self-pay | Admitting: *Deleted

## 2010-11-08 ENCOUNTER — Ambulatory Visit (INDEPENDENT_AMBULATORY_CARE_PROVIDER_SITE_OTHER): Payer: PRIVATE HEALTH INSURANCE | Admitting: Family Medicine

## 2010-11-08 DIAGNOSIS — M2550 Pain in unspecified joint: Secondary | ICD-10-CM | POA: Insufficient documentation

## 2010-11-08 DIAGNOSIS — Z9882 Breast implant status: Secondary | ICD-10-CM

## 2010-11-08 DIAGNOSIS — R5381 Other malaise: Secondary | ICD-10-CM

## 2010-11-08 DIAGNOSIS — Z978 Presence of other specified devices: Secondary | ICD-10-CM

## 2010-11-08 DIAGNOSIS — R5383 Other fatigue: Secondary | ICD-10-CM

## 2010-11-08 NOTE — Assessment & Plan Note (Signed)
See joint pains above.

## 2010-11-08 NOTE — Patient Instructions (Signed)
Please schedule your complete physical in the next 2-3 months We'll notify you of your lab results I'm so glad you did not have a TIA Call with any questions or concerns Hang in there!

## 2010-11-08 NOTE — Assessment & Plan Note (Addendum)
Pt has seen neuro and rheum w/ multiple workups and no results.  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  One site recommends checking a heavy metal panel to assess exposure.  Will order but cautioned pt that i am not sure if this will provide any answers.  Pt aware.  Will follow.  Total time spent w/ pt, 28 minutes.

## 2010-11-08 NOTE — Progress Notes (Signed)
  Subjective:    Patient ID: Becky Boyd, female    DOB: [birth year], 55 y.o.   MRN: [id number]  HPI TIA- pt followed up w/ neuro and was told that she did not have an event.  This pleased pt but family felt he was dismissive.  Pt aware that tests all came back and the results are good but feels 'there's just something wrong w/ me.  You must think i'm crazy b/c all these tests say i'm fine but i don't feel fine.  i hurt all over, i'm exhausted- this is not normal'.  Has seen neuro, rheum- no answers.  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  Not sure what to do next.   Review of Systems For ROS see HPI     Objective:   Physical Exam  Constitutional: She is oriented to person, place, and time. She appears well-developed and well-nourished. No distress.  HENT:  Head: Normocephalic and atraumatic.  Eyes: Conjunctivae and EOM are normal. Pupils are equal, round, and reactive to light.  Neurological: She is alert and oriented to person, place, and time. No cranial nerve deficit. Coordination normal.  Skin: Skin is warm and dry.  Psychiatric: She has a normal mood and affect. Her behavior is normal. Thought content normal.       Upset when talking about her sxs and lack of answers- mood and affect appropriate          Assessment & Plan:

## 2010-11-10 LAB — HEAVY METALS, BLOOD
Lead: <2 ug/dL (ref <10)
Mercury, B: <4 mcg/L (ref <=10)

## 2010-11-12 ENCOUNTER — Encounter: Payer: Self-pay | Admitting: *Deleted

## 2010-12-14 NOTE — Progress Notes (Signed)
Was done at outside lab and reviewed by Dr. Beverely Low on 11/09/10.

## 2011-01-25 ENCOUNTER — Other Ambulatory Visit: Payer: Self-pay | Admitting: Family Medicine

## 2011-01-25 ENCOUNTER — Encounter: Payer: Self-pay | Admitting: Family Medicine

## 2011-01-25 ENCOUNTER — Ambulatory Visit (INDEPENDENT_AMBULATORY_CARE_PROVIDER_SITE_OTHER): Payer: PRIVATE HEALTH INSURANCE | Admitting: Family Medicine

## 2011-01-25 DIAGNOSIS — M2550 Pain in unspecified joint: Secondary | ICD-10-CM

## 2011-01-25 DIAGNOSIS — R413 Other amnesia: Secondary | ICD-10-CM | POA: Insufficient documentation

## 2011-01-25 DIAGNOSIS — Z0000 Encounter for general adult medical examination without abnormal findings: Secondary | ICD-10-CM

## 2011-01-25 DIAGNOSIS — R5381 Other malaise: Secondary | ICD-10-CM

## 2011-01-25 DIAGNOSIS — R5383 Other fatigue: Secondary | ICD-10-CM

## 2011-01-25 DIAGNOSIS — E039 Hypothyroidism, unspecified: Secondary | ICD-10-CM

## 2011-01-25 DIAGNOSIS — R319 Hematuria, unspecified: Secondary | ICD-10-CM

## 2011-01-25 LAB — CBC WITH DIFFERENTIAL/PLATELET
Basophils Relative: 0 % (ref 0–1)
Eosinophils Absolute: 0.1 10*3/uL (ref 0.0–0.7)
HCT: 37.2 % (ref 36.0–46.0)
Hemoglobin: 12.2 g/dL (ref 12.0–15.0)
MCH: 31.4 pg (ref 26.0–34.0)
MCHC: 32.8 g/dL (ref 30.0–36.0)
Monocytes Absolute: 0.4 10*3/uL (ref 0.1–1.0)
Monocytes Relative: 7 % (ref 3–12)

## 2011-01-25 LAB — BASIC METABOLIC PANEL
BUN: 14 mg/dL (ref 6–23)
Calcium: 9.9 mg/dL (ref 8.4–10.5)
Creat: 0.9 mg/dL (ref 0.50–1.10)
Glucose, Bld: 97 mg/dL (ref 70–99)

## 2011-01-25 LAB — POCT URINALYSIS DIPSTICK
Bilirubin, UA: NEGATIVE
Ketones, UA: NEGATIVE
Nitrite, UA: NEGATIVE
Protein, UA: NEGATIVE
pH, UA: 6

## 2011-01-25 LAB — HEPATIC FUNCTION PANEL
AST: 24 U/L (ref 0–37)
Albumin: 4 g/dL (ref 3.5–5.2)
Total Bilirubin: 0.4 mg/dL (ref 0.3–1.2)

## 2011-01-25 LAB — LIPID PANEL
Cholesterol: 183 mg/dL (ref 0–200)
HDL: 58 mg/dL (ref >39)
Total CHOL/HDL Ratio: 3.2 Ratio

## 2011-01-25 NOTE — Patient Instructions (Signed)
We'll notify you of your lab results Someone will call you with your Neuro appt Please call Dr Otelia Sergeant and have someone evaluate your shoulder and back pain Have Dr Dierdre Forth explain your labs to you- Sjogren's is 10x upper limit and you're having Raynaud's (blue lips and fingers) HANG IN THERE!!!

## 2011-01-25 NOTE — Progress Notes (Signed)
  Subjective:    Patient ID: Becky Boyd, female    DOB: 15-Feb-1956, 55 y.o.   MRN: [id number]  HPI CPE- GYN [place], UTD on colonoscopy  Joint pain- has labs from rheum showing mildly elevated ESR, CRP, CCP but Sjogren's is 10x upper limit of normal.  Wants me to review labs and give my opinion.  Fatigue- 'tired like i'm 55 yrs old and there's no sense to it'.  Ongoing problem.  Feels this is contributing to poor memory.  R back pain, shoulder pain, hip pain- has seen Dr Otelia Sergeant previously.  Stopped taking NSAIDs after labs in May.    Raynauds- bilaterally, will also have bluish discoloration of lips.  Review of Systems Patient reports no vision/ hearing changes, adenopathy,fever, weight change,  persistant/recurrent hoarseness , swallowing issues, chest pain, palpitations, edema, persistant/recurrent cough, hemoptysis, dyspnea (rest/exertional/paroxysmal nocturnal), gastrointestinal bleeding (melena, rectal bleeding), abdominal pain, significant heartburn, bowel changes, GU symptoms (dysuria, hematuria, incontinence), Gyn symptoms (abnormal  bleeding, pain),  syncope, focal weakness, abnormal bruising or bleeding, anxiety, or depression.     Objective:   Physical Exam  General Appearance:    Alert, cooperative, no distress, appears stated age  Head:    Normocephalic, without obvious abnormality, atraumatic  Eyes:    PERRL, conjunctiva/corneas clear, EOM's intact, fundi    benign, both eyes  Ears:    Normal TM's and external ear canals, both ears  Nose:   Nares normal, septum midline, mucosa normal, no drainage    or sinus tenderness  Throat:   Lips, mucosa, and tongue normal; teeth and gums normal  Neck:   Supple, symmetrical, trachea midline, no adenopathy;    Thyroid: no enlargement/tenderness/nodules  Back:     Symmetric, no curvature, ROM normal, no CVA tenderness  Lungs:     Clear to auscultation bilaterally, respirations unlabored  Chest Wall:    No tenderness or deformity   Heart:    Regular rate and rhythm, S1 and S2 normal, no murmur, rub   or gallop  Breast Exam:    Deferred to GYN  Abdomen:     Soft, non-tender, bowel sounds active all four quadrants,    no masses, no organomegaly  Genitalia:    Deferred to GYN  Rectal:    Extremities:   Extremities normal, atraumatic, no cyanosis or edema.  + TTP over multiple trigger points  Pulses:   2+ and symmetric all extremities  Skin:   Skin color, texture, turgor normal, no rashes or lesions  Lymph nodes:   Cervical, supraclavicular, and axillary nodes normal  Neurologic:   CNII-XII intact, normal strength, sensation and reflexes    throughout          Assessment & Plan:

## 2011-01-26 LAB — TSH: TSH: 0.969 u[IU]/mL (ref 0.350–4.500)

## 2011-01-27 LAB — VITAMIN D 1,25 DIHYDROXY
Vitamin D 1, 25 (OH)2 Total: 57 pg/mL (ref 18–72)
Vitamin D2 1, 25 (OH)2: <8 pg/mL

## 2011-01-28 ENCOUNTER — Telehealth: Payer: Self-pay

## 2011-01-28 MED ORDER — CIPROFLOXACIN HCL 500 MG PO TABS: 500.0000 mg | ORAL_TABLET | Freq: Two times a day (BID) | ORAL | Status: AC

## 2011-01-28 NOTE — Telephone Encounter (Signed)
Pt aware and verbalized understanding. Rx sent and labs mailed, per pt request

## 2011-01-28 NOTE — Telephone Encounter (Signed)
Left message for pt to call back  °

## 2011-01-29 ENCOUNTER — Ambulatory Visit: Payer: PRIVATE HEALTH INSURANCE | Admitting: Neurology

## 2011-01-29 LAB — URINE CULTURE

## 2011-01-31 ENCOUNTER — Encounter: Payer: Self-pay | Admitting: Family Medicine

## 2011-01-31 NOTE — Assessment & Plan Note (Signed)
Check labs.  Adjust meds prn  

## 2011-01-31 NOTE — Assessment & Plan Note (Signed)
Despite pt's multiple complaints her PE was normal.  UTD on health maintenance.  Check labs.  Anticipatory guidance provided.

## 2011-01-31 NOTE — Assessment & Plan Note (Signed)
Pt has had recent imaging studies that show nonspecific changes in white matter.  Has neurologist.  Will defer tx to him.

## 2011-01-31 NOTE — Assessment & Plan Note (Signed)
Check labs to r/o metabolic cause.  Pt may have underlying rheumatologic process that is contributing given recent lab results.  She is to call rheum and discuss.  Will follow and assist as able.

## 2011-01-31 NOTE — Assessment & Plan Note (Signed)
Pt may have underlying rheumatologic process.  Is to f/u w/ Rheum.  Also has orthopedic doctor and is due for f/u w/ him as well.  Encouraged her to take NSAIDs prn for pain relief.

## 2011-02-07 ENCOUNTER — Ambulatory Visit: Payer: PRIVATE HEALTH INSURANCE | Admitting: Neurology

## 2011-02-12 ENCOUNTER — Ambulatory Visit: Payer: PRIVATE HEALTH INSURANCE | Admitting: Neurology

## 2011-02-13 ENCOUNTER — Other Ambulatory Visit (INDEPENDENT_AMBULATORY_CARE_PROVIDER_SITE_OTHER): Payer: PRIVATE HEALTH INSURANCE

## 2011-02-13 ENCOUNTER — Other Ambulatory Visit: Payer: PRIVATE HEALTH INSURANCE

## 2011-02-13 DIAGNOSIS — R319 Hematuria, unspecified: Secondary | ICD-10-CM

## 2011-02-13 LAB — POCT URINALYSIS DIPSTICK
Bilirubin, UA: NEGATIVE
Leukocytes, UA: NEGATIVE
Nitrite, UA: NEGATIVE
pH, UA: 5

## 2011-02-13 NOTE — Progress Notes (Signed)
Labs only

## 2011-02-15 ENCOUNTER — Telehealth: Payer: Self-pay | Admitting: *Deleted

## 2011-02-15 NOTE — Telephone Encounter (Signed)
Pt just wanted to say thanks to Dr Beverely Low for her concern about her labs from Dr Phillips Hay. Pt states that she spoke with physician about labs and he readdress them with her and said to thank Dr Beverely Low for her concern and that she is a good physician. Pt just wanted to let you know that she has since been Dx in the mild stages of Lupus and Sjogrens and again wanted to thank you.

## 2011-02-16 NOTE — Telephone Encounter (Signed)
Appreciate the kind words.  Glad she has found some answers

## 2011-02-28 ENCOUNTER — Ambulatory Visit: Payer: PRIVATE HEALTH INSURANCE | Admitting: Neurology

## 2011-03-07 ENCOUNTER — Ambulatory Visit (INDEPENDENT_AMBULATORY_CARE_PROVIDER_SITE_OTHER): Payer: PRIVATE HEALTH INSURANCE | Admitting: Neurology

## 2011-03-07 ENCOUNTER — Encounter: Payer: Self-pay | Admitting: Neurology

## 2011-03-07 DIAGNOSIS — R413 Other amnesia: Secondary | ICD-10-CM

## 2011-03-07 NOTE — Patient Instructions (Signed)
Your MRI has been scheduled for Monday, October 15th at 5:00pm.  Please arrive to [facility], First floor admitting by 4:45pm.  [phone]  We will call you with your appointment for the memory loss testing at [facility] [address]. [place], [place]  [phone]

## 2011-03-07 NOTE — Progress Notes (Signed)
Dear Dr. Beverely Low,  Thank you for having me see Becky Boyd in consultation today at [facility] for her problem with memory.  As you may recall, she is a 55 y.o. year old female with a history of possible Sjogren's disease and/or lupus with a multiyear complaint of difficulty with memory.  She says that she has noted that her memory problems are getting worse.  She will frequently forget conversations she had the day before.  Her daughter says the frequently repeats stories.  She has to use a calendar to remember appointments.  She frequently puts things away and can't find them.  She will repeat the same questions after a couple of hours.  She has had word finding difficulties as well, such that this causes pauses in sentences.  She has gotten lost on occasion in her car in familiar places.  She denies changes in her bladder or bowel habits.  She denies changes in her gait.  She denies hallucinations or delusions.  She does get intermittently irritable but denies depression.  She also has had a tremor for several months.  It is mainly right sided.  She also complains of "cramping" of one of the toes of her foot.  She also had a spell in June 2012 when she had the onset of pain in the left side of her neck and shoulder as well as difficulty speaking that lasted minutes.  An MRI brain revealed non specific white matter disease and MRA head revealed mild irregularity of the bilateral internal carotid arteries.  There was equivocal narrowing of the bilateral proximal ICAs as well on MRA neck.  Two previous MRAs of the head have shown a possible 2-16mm sessile left cavernous carotid aneurysm as well as right ICA infundibulum at the origin of the PCOM.  Interestingly she has also been diagnosed with Lupus and Sjogren's based on serology although it appears from rheumatology's note that this is by no means certain.  She has recently been placed on hydroxychloroquine.  Past Medical History  Diagnosis  Date  . Migraines   . Anorexia   . Dysphagia   . Hashimoto thyroiditis   . Hypothyroidism   . Lupus possible diagnosis  . Fibromyalgia   . Sjogren's disease possible diagnosis  . Aneurysm  equivocal evidence of aneurysm on MRI    Past Surgical History:  Bilateral L5-S1 microdiscectomy as well as fusion of C5-C7 followed by fusion and instrumentation of C4-C5.  Social History:  No tobacco, no EtOH.  Family History:  Previous history of dementia in mother, although also a question of Parkinson's in her mother.   ROS:  13 systems were reviewed and are notable for diffuse body pain, "in her bones".  All other review of systems are unremarkable.   Examination:  Filed Vitals:   03/07/11 1537  BP: 110/70  Pulse: 76  Weight: 131 lb (59.421 kg)     In general, well appearing woman in no acute distress.  Cardiovascular: The patient has a regular rate and rhythm and no carotid bruits.  Fundoscopy:  Disks are flat. Vessel caliber within normal limits.  Mental status:   MMSE 29/30 with 1 point lost for 3 word recall.  Cranial Nerves: Pupils are equally round and reactive to light. Visual fields full to confrontation. Extraocular movements are intact without nystagmus. Saccades are normal.  Facial sensation and muscles of mastication are intact. Muscles of facial expression are symmetric. Hearing intact to bilateral finger rub. Tongue protrusion, uvula, palate midline.  Shoulder shrug intact.  No significant axial rigidity.  Motor:  The patient has normal tone.  She has an obvious bilateral right sided predominant resting tremor, that is coarse and also has a postural component. With no pronator drift.  She has is bradykinetic bilaterally with finger tapping, worse on the right.  5/5 muscle strength bilaterally.  Brisk reflexes throughout, but toes down.  Coordination:  Normal finger to nose, except for terminal tremor.  No dysdiadokinesia.  Sensation is intact to temperature and  vibration.  Gait reveals reduced arm swing.  Station is normal.  Romberg is negative.  MRI Brain was reviewed and it reveals some moderate non-specific white matter changes.  No stigmata of a parkinson's plus syndrome.  Also temporal lobes seem relatively intact.   Impression: I am concerned that given the syndrome of resting tremor with bradykinesia and subtle memory difficulties that we are seeing Ms. Nichelson at the initial stages of an unknown neurodegenerative process.  I am suspicious for one of the Parkinson's plus syndromes such as Lewy Body Dementia.  Given her history of Hashimoto's thyroiditis I guess it is possible this represents a steroid responsive auto-immune encephalopathy but I think this is much less likely given the onset of Hashimoto's encephalopathy is usually much quicker.  We must also consider her provisional diagnosis of Lupus and Sjogren's supported by markers of auto-immunity tested by rheumatology.  Given that it is possible this represents Lupus cerebritis but again this would be an unusual presentation.   Recommendations:  I am going to get memory testing to better quantify her memory dysfunction.  When I see her back I will consider functional brain imaging as well particularly if her memory function is confirmed on more extensive testing.  We will see the patient back in about 6 weeks after her memory testing.  Thank you for having [place] see Becky Boyd in consultation.  Feel free to contact me with any questions.  Lupita Raider Modesto Charon, MD [facility], [place] [address] [place], [place] [address] Phone: [phone] Fax: [phone].

## 2011-03-11 ENCOUNTER — Telehealth: Payer: Self-pay

## 2011-03-11 NOTE — Telephone Encounter (Signed)
Message copied by Lelon Huh on Mon Mar 11, 2011  3:31 PM ------      Message from: Milas Gain      Created: Thu Mar 07, 2011  5:27 PM       please cancel Ms. Feild' MRI brain.  My mistake, she just had one in June.  Please let patient know that one will be sufficient.

## 2011-03-11 NOTE — Telephone Encounter (Signed)
Informed pt that she does not need the MRI we scheduled and it was cancelled.

## 2011-03-18 ENCOUNTER — Other Ambulatory Visit (HOSPITAL_COMMUNITY): Payer: PRIVATE HEALTH INSURANCE

## 2011-03-22 ENCOUNTER — Ambulatory Visit (INDEPENDENT_AMBULATORY_CARE_PROVIDER_SITE_OTHER): Payer: PRIVATE HEALTH INSURANCE | Admitting: Family Medicine

## 2011-03-22 ENCOUNTER — Encounter: Payer: Self-pay | Admitting: Family Medicine

## 2011-03-22 VITALS — BP 110/68 | HR 80 | Temp 98.1°F | Ht 62.75 in | Wt 132.4 lb

## 2011-03-22 DIAGNOSIS — M549 Dorsalgia, unspecified: Secondary | ICD-10-CM

## 2011-03-22 MED ORDER — HYDROCODONE-ACETAMINOPHEN 5-500 MG PO TABS: 1.0000 | ORAL_TABLET | ORAL | Status: DC | PRN

## 2011-03-22 NOTE — Progress Notes (Signed)
  Subjective:    Patient ID: Becky Boyd, female    DOB: 11-21-1955, 55 y.o.   MRN: [id number]  HPI Back pain- 'hurts like a son of a bitch'.  sxs started 4 months ago intermittently.  10 days ago developed pain in R shoulder blade.  Unable to lie down, unable to get comfortable.  Most tender over the muscles and soft tissues.  Will take Vicodin prn for severe pain- would temporarily ease pain.  Also has baclofen available w/out relief.  Using heating pad.  No increased activity, injury.   Review of Systems For ROS see HPI     Objective:   Physical Exam  Vitals reviewed. Constitutional: She is oriented to person, place, and time. She appears well-developed and well-nourished.       Obviously uncomfortable  HENT:  Head: Normocephalic and atraumatic.  Musculoskeletal: She exhibits tenderness (over multiple trigger points in neck, back, upper arms, legs).  Lymphadenopathy:    She has no cervical adenopathy.  Neurological: She is alert and oriented to person, place, and time. She has normal reflexes. No cranial nerve deficit. Coordination normal.          Assessment & Plan:

## 2011-03-22 NOTE — Patient Instructions (Signed)
This is all soft tissue/trigger point pain Call Dr Dierdre Forth and see if his office does trigger point injxns- this would most effective pain relief Take the Vicodin as needed- don't take any additional tylenol You can continue the Aleve (up to 4 tabs daily) Use the Baclofen as needed Continue the heating pad Hang in there!!!

## 2011-04-06 NOTE — Assessment & Plan Note (Signed)
Pt's pain seems to be soft tissue in nature and not bony or joint related.  Multiple trigger points are painful to palpation.  ? Fibro.  Would benefit from trigger point injxns although I don't do these.  Encouraged her to contact Rheum or Neuro for possible tx.  Reviewed supportive care and red flags that should prompt return.  Pt expressed understanding and is in agreement w/ plan.

## 2011-04-24 ENCOUNTER — Ambulatory Visit: Payer: PRIVATE HEALTH INSURANCE | Admitting: Neurology

## 2011-05-24 ENCOUNTER — Encounter: Payer: Self-pay | Admitting: Neurology

## 2011-05-24 ENCOUNTER — Ambulatory Visit (INDEPENDENT_AMBULATORY_CARE_PROVIDER_SITE_OTHER): Payer: PRIVATE HEALTH INSURANCE | Admitting: Neurology

## 2011-05-24 VITALS — BP 110/70 | HR 88 | Wt 129.0 lb

## 2011-05-24 DIAGNOSIS — R413 Other amnesia: Secondary | ICD-10-CM

## 2011-05-24 MED ORDER — CITALOPRAM HYDROBROMIDE 10 MG PO TABS: 10.0000 mg | ORAL_TABLET | Freq: Every day | ORAL | Status: DC

## 2011-05-26 NOTE — Progress Notes (Signed)
Dear Dr. Beverely Low,  I saw  Becky Boyd back in [facility] for her problem with memory loss.  As you may recall, she is a 55 y.o. year old female with a history of Hashimoto's thyroiditis and possible Sjogren's or lupus who presents with at least 1 year history of memory loss.  At her first visit, I was worried for an underlying neurodegenerative condition, given the postural tremor with some resting components and mild bradykinesia. I ordered NP testing that revealed largely normal cognition.  However, there was concern that her mood and anxiety may be playing a role in her cognition.  She does say that she has been more worried about her husband Becky Boyd -- who I also see - over the last year or so.  She denies other stresses other than money.  She doesn't endorse depression.  Medical history, social history, and family history were reviewed and have not changed since the last clinic visit.  Current Outpatient Prescriptions on File Prior to Visit  Medication Sig Dispense Refill  . baclofen (LIORESAL) 10 MG tablet Take 10 mg by mouth 3 (three) times daily.        Marland Kitchen estradiol (ESTRACE) 1 MG tablet Take 1 mg by mouth daily.        Marland Kitchen HYDROcodone-acetaminophen (VICODIN) 5-500 MG per tablet Take 1 tablet by mouth every 4 (four) hours as needed.  30 tablet  0  . hydroxychloroquine (PLAQUENIL) 200 MG tablet Take 200 mg by mouth daily.        Marland Kitchen levothyroxine (SYNTHROID, LEVOTHROID) 75 MCG tablet Take 75 mcg by mouth daily.          No Known Allergies  ROS:  13 systems were reviewed and are notable for fatigue and joint pain..  All other review of systems are unremarkable.  Impression:  Subjective memory problems, that are worse than her testing reflects.  I suspect that anxiety is playing somewhat of a role.  I have offered her citalopram to see if this helps her anxiety which may improve her memory.  As for my initial concern about her having an underlying neurodegenerative condition, we will  continue to follow her clinically.   We will see the patient back in 3 months.  I spent more 50% of this 25 minute appointment counseling the patient.   Lupita Raider Modesto Charon, MD [facility], [place]

## 2011-06-18 LAB — HM DEXA SCAN: HM DEXA SCAN: NORMAL

## 2011-08-02 ENCOUNTER — Telehealth: Payer: Self-pay | Admitting: Family Medicine

## 2011-08-02 MED ORDER — AZITHROMYCIN 250 MG PO TABS: ORAL_TABLET | ORAL | Status: AC

## 2011-08-02 NOTE — Telephone Encounter (Signed)
Please advise 

## 2011-08-02 NOTE — Telephone Encounter (Signed)
Please call in Zpack for pt based on exposure to whooping cough

## 2011-08-02 NOTE — Telephone Encounter (Signed)
Spoke to pt to advise results/instructions. Pt understood. Sent rx for zpack to [facility].

## 2011-08-02 NOTE — Telephone Encounter (Signed)
Patient called the office asking to speak with Dr. Beverely Low. She stated that last week her daughter was diagnosed with whopping cough and is currently taking antibiotics. She, daughter, got a it from a child that she was taking care off for more than 4 hrs a day and who was also diagnosed with it. Mrs.Mowrer mentioned that she was around her daughter last week (before she knew about it) and is now worried that she might have it. Wants to know if she can be tested for it. Best number to reach Mrs. Sermons is [phone].JB

## 2011-08-22 ENCOUNTER — Ambulatory Visit: Payer: PRIVATE HEALTH INSURANCE | Admitting: Neurology

## 2011-09-03 ENCOUNTER — Ambulatory Visit: Payer: PRIVATE HEALTH INSURANCE | Admitting: Neurology

## 2011-11-21 ENCOUNTER — Ambulatory Visit
Admission: RE | Admit: 2011-11-21 | Discharge: 2011-11-21 | Disposition: A | Discharge status: Home / Self Care | Payer: PRIVATE HEALTH INSURANCE | Source: Ambulatory Visit | Attending: Neurology | Admitting: Neurology

## 2011-11-21 ENCOUNTER — Other Ambulatory Visit: Payer: Self-pay | Admitting: Neurology

## 2011-11-21 DIAGNOSIS — M545 Low back pain: Secondary | ICD-10-CM

## 2012-03-15 ENCOUNTER — Emergency Department (HOSPITAL_BASED_OUTPATIENT_CLINIC_OR_DEPARTMENT_OTHER)
Admission: EM | Admit: 2012-03-15 | Discharge: 2012-03-15 | Disposition: A | Discharge status: Home / Self Care | Payer: No Typology Code available for payment source | Attending: Emergency Medicine | Admitting: Emergency Medicine

## 2012-03-15 ENCOUNTER — Encounter (HOSPITAL_BASED_OUTPATIENT_CLINIC_OR_DEPARTMENT_OTHER): Payer: Self-pay | Admitting: *Deleted

## 2012-03-15 DIAGNOSIS — T2000XA Burn of unspecified degree of head, face, and neck, unspecified site, initial encounter: Secondary | ICD-10-CM

## 2012-03-15 DIAGNOSIS — X131XXA Other contact with steam and other hot vapors, initial encounter: Secondary | ICD-10-CM | POA: Insufficient documentation

## 2012-03-15 DIAGNOSIS — E039 Hypothyroidism, unspecified: Secondary | ICD-10-CM | POA: Insufficient documentation

## 2012-03-15 DIAGNOSIS — T2010XA Burn of first degree of head, face, and neck, unspecified site, initial encounter: Secondary | ICD-10-CM | POA: Insufficient documentation

## 2012-03-15 DIAGNOSIS — Y93G3 Activity, cooking and baking: Secondary | ICD-10-CM | POA: Insufficient documentation

## 2012-03-15 DIAGNOSIS — X12XXXA Contact with other hot fluids, initial encounter: Secondary | ICD-10-CM | POA: Insufficient documentation

## 2012-03-15 DIAGNOSIS — T23219A Burn of second degree of unspecified thumb (nail), initial encounter: Secondary | ICD-10-CM | POA: Insufficient documentation

## 2012-03-15 DIAGNOSIS — IMO0001 Reserved for inherently not codable concepts without codable children: Secondary | ICD-10-CM | POA: Insufficient documentation

## 2012-03-15 MED ORDER — OXYCODONE-ACETAMINOPHEN 5-325 MG PO TABS: 1.0000 | ORAL_TABLET | Freq: Four times a day (QID) | ORAL | Status: DC | PRN

## 2012-03-15 MED ORDER — SODIUM CHLORIDE 0.9 % IV SOLN
INTRAVENOUS | Status: DC
  Administered 2012-03-15: 15:00:00 via INTRAVENOUS

## 2012-03-15 MED ORDER — BACITRACIN ZINC 500 UNIT/GM EX OINT
1.0000 "application " | TOPICAL_OINTMENT | Freq: Once | CUTANEOUS | Status: AC
  Administered 2012-03-15: 1 via TOPICAL
  Filled 2012-03-15: qty 15

## 2012-03-15 MED ORDER — LIDOCAINE 4 % EX CREA: TOPICAL_CREAM | Freq: Once | CUTANEOUS | Status: DC

## 2012-03-15 MED ORDER — LIDOCAINE 4 % EX CREA
TOPICAL_CREAM | CUTANEOUS | Status: AC
  Filled 2012-03-15: qty 5

## 2012-03-15 MED ORDER — MORPHINE SULFATE 4 MG/ML IJ SOLN: 4.0000 mg | Freq: Once | INTRAMUSCULAR | Status: DC

## 2012-03-15 MED ORDER — MORPHINE SULFATE 4 MG/ML IJ SOLN
4.0000 mg | INTRAMUSCULAR | Status: AC | PRN
  Administered 2012-03-15 (×3): 4 mg via INTRAVENOUS
  Filled 2012-03-15 (×3): qty 1

## 2012-03-15 MED ORDER — KETOROLAC TROMETHAMINE 30 MG/ML IJ SOLN
INTRAMUSCULAR | Status: AC
  Administered 2012-03-15: 30 mg
  Filled 2012-03-15: qty 1
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
## 2012-03-16 ENCOUNTER — Emergency Department (HOSPITAL_BASED_OUTPATIENT_CLINIC_OR_DEPARTMENT_OTHER)
Admission: EM | Admit: 2012-03-16 | Discharge: 2012-03-16 | Disposition: A | Discharge status: Home / Self Care | Payer: No Typology Code available for payment source

## 2012-06-17 LAB — HM PAP SMEAR: HM PAP: NORMAL

## 2012-09-29 ENCOUNTER — Encounter: Payer: Self-pay | Admitting: Lab

## 2012-09-30 ENCOUNTER — Ambulatory Visit (INDEPENDENT_AMBULATORY_CARE_PROVIDER_SITE_OTHER): Payer: Medicare Other | Admitting: Family Medicine

## 2012-09-30 ENCOUNTER — Encounter: Payer: Self-pay | Admitting: Family Medicine

## 2012-09-30 VITALS — BP 120/80 | HR 74 | Temp 98.2°F | Ht 62.5 in | Wt 131.8 lb

## 2012-09-30 DIAGNOSIS — M533 Sacrococcygeal disorders, not elsewhere classified: Secondary | ICD-10-CM

## 2012-09-30 DIAGNOSIS — M545 Low back pain: Secondary | ICD-10-CM

## 2012-09-30 MED ORDER — PREDNISONE 10 MG PO TABS: ORAL_TABLET | ORAL | Status: DC

## 2012-09-30 NOTE — Assessment & Plan Note (Signed)
New.  Given pt's hx of rheumatologic issues question whether she has sacroiliitis.  Start pred taper.  Pt to call rheum for evaluation.  If rheum not convinced sxs are rheumatologic, will refer to ortho.  Reviewed supportive care and red flags that should prompt return.  Pt expressed understanding and is in agreement w/ plan.

## 2012-09-30 NOTE — Progress Notes (Signed)
  Subjective:    Patient ID: Becky Boyd, female    DOB: [birth year], 57 y.o.   MRN: [id number]  HPI Chronic back pain- deteriorated.  Pt reports that in last few weeks she has had much more severe pain and physical limitations.  Unable to bend forward to lift toilet seat.  Difficulty getting out of bed.  Hx of surgery on L5/S1.  Pain will start right over tailbone and radiate into buttocks and upper thighs bilaterally, R>L.  No leg weakness or numbness.  Has not seen rheum recently.  Hx of kidney stones.  No hematuria.  Still on 5 mg prednisone daily.  On methotrexate.  Taking mobic daily w/ some relief.   Review of Systems For ROS see HPI     Objective:   Physical Exam  Vitals reviewed. Constitutional: She is oriented to person, place, and time. She appears well-developed and well-nourished. No distress.  Musculoskeletal:  + SLR on R, negative on L + TTP over SI joints Pain w/ forward flexion>extension  Neurological: She is alert and oriented to person, place, and time. She has normal reflexes. Coordination normal.          Assessment & Plan:

## 2012-09-30 NOTE — Patient Instructions (Addendum)
Call Dr Dierdre Forth and get an appt Start the increased Prednisone dose to help w/ pain and inflammation (take w/ food) If Dr Dierdre Forth feels that the pain is not rheumatologic (sacroileitis), we'll get you set up w/ ortho Call with any questions or concerns Hang in there!

## 2013-05-11 ENCOUNTER — Other Ambulatory Visit: Payer: Self-pay | Admitting: Neurology

## 2013-05-11 DIAGNOSIS — R413 Other amnesia: Secondary | ICD-10-CM

## 2013-05-24 ENCOUNTER — Other Ambulatory Visit: Payer: Medicare Other

## 2013-06-04 ENCOUNTER — Other Ambulatory Visit: Payer: Medicare Other

## 2013-06-04 ENCOUNTER — Inpatient Hospital Stay: Admission: RE | Admit: 2013-06-04 | Payer: Medicare Other | Source: Ambulatory Visit

## 2013-06-15 ENCOUNTER — Ambulatory Visit
Admission: RE | Admit: 2013-06-15 | Discharge: 2013-06-15 | Disposition: A | Discharge status: Home / Self Care | Payer: Medicare Other | Source: Ambulatory Visit | Attending: Neurology | Admitting: Neurology

## 2013-06-15 DIAGNOSIS — R413 Other amnesia: Secondary | ICD-10-CM

## 2013-06-15 DIAGNOSIS — R519 Headache, unspecified: Secondary | ICD-10-CM

## 2013-06-15 DIAGNOSIS — R51 Headache: Principal | ICD-10-CM

## 2013-06-17 ENCOUNTER — Encounter: Payer: Self-pay | Admitting: Family Medicine

## 2013-06-17 ENCOUNTER — Ambulatory Visit (INDEPENDENT_AMBULATORY_CARE_PROVIDER_SITE_OTHER): Payer: Medicare Other | Admitting: Family Medicine

## 2013-06-17 VITALS — BP 116/74 | HR 75 | Temp 98.2°F | Resp 16 | Wt 133.4 lb

## 2013-06-17 DIAGNOSIS — M3500 Sicca syndrome, unspecified: Secondary | ICD-10-CM | POA: Insufficient documentation

## 2013-06-17 DIAGNOSIS — H538 Other visual disturbances: Secondary | ICD-10-CM

## 2013-06-17 DIAGNOSIS — R9089 Other abnormal findings on diagnostic imaging of central nervous system: Secondary | ICD-10-CM

## 2013-06-17 DIAGNOSIS — R350 Frequency of micturition: Secondary | ICD-10-CM

## 2013-06-17 DIAGNOSIS — R9082 White matter disease, unspecified: Secondary | ICD-10-CM | POA: Insufficient documentation

## 2013-06-17 DIAGNOSIS — E039 Hypothyroidism, unspecified: Secondary | ICD-10-CM

## 2013-06-17 DIAGNOSIS — R319 Hematuria, unspecified: Secondary | ICD-10-CM

## 2013-06-17 DIAGNOSIS — R930 Abnormal findings on diagnostic imaging of skull and head, not elsewhere classified: Secondary | ICD-10-CM

## 2013-06-17 DIAGNOSIS — R413 Other amnesia: Secondary | ICD-10-CM

## 2013-06-17 LAB — POCT URINALYSIS DIPSTICK
Bilirubin, UA: NEGATIVE
GLUCOSE UA: NEGATIVE
Ketones, UA: NEGATIVE
Leukocytes, UA: NEGATIVE
NITRITE UA: NEGATIVE
PH UA: 5
Protein, UA: NEGATIVE
Spec Grav, UA: >=1.03
UROBILINOGEN UA: 0.2

## 2013-06-17 MED ORDER — CEPHALEXIN 500 MG PO CAPS
500.0000 mg | ORAL_CAPSULE | Freq: Two times a day (BID) | ORAL | Status: AC
Start: 2013-06-17 — End: 2013-06-27

## 2013-06-17 NOTE — Patient Instructions (Signed)
Schedule your complete physical in 6 months We'll notify you of your lab results and make any changes if needed We'll call you with your neuro appt Start the keflex for the UTI Drink plenty of fluids Hang in there!!!

## 2013-06-17 NOTE — Progress Notes (Signed)
   Subjective:    Patient ID: Becky Boyd, female    DOB: [birth year], 58 y.o.   MRN: [id number]  HPI Abnormal MRI- pt has multiple areas on MRI that are now concerning for possible MS.  # of white matter spots has increased in last 2 yrs.  Pt denies weakness, numbness, falls.  Will have intermittent tremors of R hand.  + blurry vision.  Currently following w/ her HA specialist.  Husband would like her to have a 2nd opinion  Hypothyroid- following w/ Dr Talmage Nap  Lupus like syndrome- following w/ Dr Dierdre Forth.  On Methotrexate and Prednisone.  Has tested + for ankylosing spondylitis and Sjogren's.  Flank pain- pt w/ hx of kidney stones.  Feels like she may have another.  Has pain meds available.  Following w/ urology.   Review of Systems For ROS see HPI     Objective:   Physical Exam  Vitals reviewed. Constitutional: She is oriented to person, place, and time. She appears well-developed and well-nourished. No distress.  HENT:  Head: Normocephalic and atraumatic.  Eyes: Conjunctivae and EOM are normal. Pupils are equal, round, and reactive to light.  Neck: Normal range of motion. Neck supple. No thyromegaly present.  Cardiovascular: Normal rate, regular rhythm, normal heart sounds and intact distal pulses.   No murmur heard. Pulmonary/Chest: Effort normal and breath sounds normal. No respiratory distress.  Abdominal: Soft. She exhibits no distension. There is tenderness (R CVA tenderness).  Musculoskeletal: She exhibits no edema.  Lymphadenopathy:    She has no cervical adenopathy.  Neurological: She is alert and oriented to person, place, and time.  Skin: Skin is warm and dry.  Psychiatric: She has a normal mood and affect. Her behavior is normal.          Assessment & Plan:

## 2013-06-17 NOTE — Progress Notes (Signed)
Pre visit review using our clinic review tool, if applicable. No additional management support is needed unless otherwise documented below in the visit note. 

## 2013-06-18 ENCOUNTER — Encounter: Payer: Self-pay | Admitting: General Practice

## 2013-06-18 ENCOUNTER — Other Ambulatory Visit: Payer: Self-pay | Admitting: General Practice

## 2013-06-18 DIAGNOSIS — E785 Hyperlipidemia, unspecified: Secondary | ICD-10-CM

## 2013-06-18 LAB — LDL CHOLESTEROL, DIRECT: Direct LDL: 123.9 mg/dL

## 2013-06-18 LAB — LIPID PANEL
Cholesterol: 232 mg/dL — ABNORMAL HIGH (ref 0–200)
HDL: 66.4 mg/dL (ref >39.00)
Total CHOL/HDL Ratio: 3
Triglycerides: 261 mg/dL — ABNORMAL HIGH (ref 0.0–149.0)
VLDL: 52.2 mg/dL — ABNORMAL HIGH (ref 0.0–40.0)

## 2013-06-18 LAB — HEPATIC FUNCTION PANEL
ALK PHOS: 66 U/L (ref 39–117)
ALT: 17 U/L (ref 0–35)
AST: 20 U/L (ref 0–37)
Albumin: 3.9 g/dL (ref 3.5–5.2)
BILIRUBIN DIRECT: 0 mg/dL (ref 0.0–0.3)
TOTAL PROTEIN: 7.2 g/dL (ref 6.0–8.3)
Total Bilirubin: 0.5 mg/dL (ref 0.3–1.2)

## 2013-06-18 LAB — BASIC METABOLIC PANEL
BUN: 17 mg/dL (ref 6–23)
CO2: 27 mEq/L (ref 19–32)
CREATININE: 0.9 mg/dL (ref 0.4–1.2)
Calcium: 9.4 mg/dL (ref 8.4–10.5)
Chloride: 103 mEq/L (ref 96–112)
GFR: 66.3 mL/min (ref >60.00)
Glucose, Bld: 79 mg/dL (ref 70–99)
Potassium: 3.5 mEq/L (ref 3.5–5.1)
Sodium: 139 mEq/L (ref 135–145)

## 2013-06-18 LAB — TSH: TSH: 1.71 u[IU]/mL (ref 0.35–5.50)

## 2013-06-18 MED ORDER — ATORVASTATIN CALCIUM 20 MG PO TABS: 20.0000 mg | ORAL_TABLET | Freq: Every day | ORAL | Status: DC

## 2013-06-19 LAB — URINE CULTURE
Colony Count: NO GROWTH
Organism ID, Bacteria: NO GROWTH

## 2013-06-20 NOTE — Assessment & Plan Note (Signed)
Following w/ Dr Talmage Nap

## 2013-06-20 NOTE — Assessment & Plan Note (Signed)
Following w/ rheum.  On chronic prednisone.

## 2013-06-20 NOTE — Assessment & Plan Note (Signed)
Husband reports this is worsening.  Pt did not bring this up nor care to discuss.  Refer to neuro for complete evaluation and tx.

## 2013-06-20 NOTE — Assessment & Plan Note (Signed)
New.  Check labs to r/o metabolic abnormalities or hyperlipidemia causing visual changes.  Abnormal MRI is suspicious for MS which could also cause blurry vision.  Refer to neuro.  Will follow.

## 2013-06-20 NOTE — Assessment & Plan Note (Signed)
New.  Suspicious for MS.  Will refer to neuro for complete evaluation and tx.  Pt expressed understanding and is in agreement w/ plan.

## 2013-06-22 ENCOUNTER — Other Ambulatory Visit: Payer: Self-pay | Admitting: Neurology

## 2013-06-22 DIAGNOSIS — R251 Tremor, unspecified: Secondary | ICD-10-CM

## 2013-06-25 ENCOUNTER — Encounter: Payer: Self-pay | Admitting: Family Medicine

## 2013-07-01 ENCOUNTER — Ambulatory Visit
Admission: RE | Admit: 2013-07-01 | Discharge: 2013-07-01 | Disposition: A | Discharge status: Home / Self Care | Payer: Medicare Other | Source: Ambulatory Visit | Attending: Neurology | Admitting: Neurology

## 2013-07-01 DIAGNOSIS — R251 Tremor, unspecified: Secondary | ICD-10-CM

## 2013-07-01 MED ORDER — GADOBENATE DIMEGLUMINE 529 MG/ML IV SOLN
12.0000 mL | Freq: Once | INTRAVENOUS | Status: AC | PRN
  Administered 2013-07-01: 12 mL via INTRAVENOUS

## 2013-07-19 ENCOUNTER — Encounter: Payer: Self-pay | Admitting: Neurology

## 2013-07-19 ENCOUNTER — Ambulatory Visit (INDEPENDENT_AMBULATORY_CARE_PROVIDER_SITE_OTHER): Payer: Medicare Other | Admitting: Neurology

## 2013-07-19 VITALS — BP 110/70 | HR 70 | Temp 97.6°F | Resp 16 | Ht 63.0 in | Wt 135.3 lb

## 2013-07-19 DIAGNOSIS — R930 Abnormal findings on diagnostic imaging of skull and head, not elsewhere classified: Secondary | ICD-10-CM

## 2013-07-19 DIAGNOSIS — F458 Other somatoform disorders: Secondary | ICD-10-CM

## 2013-07-19 DIAGNOSIS — F444 Conversion disorder with motor symptom or deficit: Secondary | ICD-10-CM

## 2013-07-19 DIAGNOSIS — R9089 Other abnormal findings on diagnostic imaging of central nervous system: Secondary | ICD-10-CM

## 2013-07-19 NOTE — Patient Instructions (Signed)
I agree with Dr.Lewit that further testing such as a spinal tap should be performed.  Possibilities do include multiple sclerosis or related to Sjogren's or non-specific.

## 2013-07-19 NOTE — Progress Notes (Addendum)
NEUROLOGY CONSULTATION NOTE  Becky Boyd MRN: [id number] DOB: 1956-03-06  Referring provider: Dr. Beverely Low Primary care provider: Dr. Beverely Low  Reason for consult:  Second opinion regarding MRI findings and possibility of MS.  HISTORY OF PRESENT ILLNESS: Becky Boyd is a 58 year old right-handed woman with history of migraines, hypothyroidism, fibromyalgia, memory problems, kidney stones, Sjogrens, and chronic back pain who presents for evaluation of possible MS.  Records and images were personally reviewed where available.    She has had several issues over the years. She saw Dr. Modesto Charon in 2012 for memory problems.  At that time, she reported forgetting recent conversations, will often repeat stories, misplaces items, repeat questions and needs calendar to remember appointments.  She also noted word-finding difficulties and on occasion would get disoriented while driving in familiar places.  She had also complained of tremor of the right hand, which is chronic and stable.  On exam, a right greater than left resting tremor was noted, and she exhibited some bradykinesia.  Gait revealed reduced arm swing but no shuffling or unsteadiness.  She underwent neuropsych testing in December 2012 which did not reveal any cognitive impairment and her deficits were suspected to be related to anxiety and stress.  She had an MRI and MRA of the head performed on 10/26/10, after developing left sided neck and shoulder pain with difficulty speaking, which revealed nonspecific white matter disease and mild irregular bilateral internal carotid arteries.  There was also narrowing of the proximal ICA bilaterally, found on MRA of the neck.  She had a repeat MRI and MRA of the brain performed on 06/15/13 to reassess prior white matter findings as well as new pulsatile tinnitus.  MRI of brain without contrast revealed progressed signal hyperintensities, mostly perpendicular to the ventricles.  MRI of brain with contrast  performed on 07/01/13 did not reveal any abnormal enhancement.  MRA of the head did not reveal any significant stenosis and no aneurysms.  Carotid doppler did not reveal any significant stenosis.  She sees Dr. Clarisse Gouge from the [facility] for chronic pain.  He recommended an LP for fluid analysis.  She reportedly had an LP performed in 2006 for abnormal MRI, which did not reveal any CSF evidence for MS.  She denies prior episodes of hemi-sensory loss, unilateral weakness, gait instability or transient visual loss or eye pain.  She did endorse longstanding history of some episodes of blurred vision associated with positive visual symptoms such as lights or floaters.  The most prominent episode lasted 20 minutes. She has longstanding history of migraines and Sjogren's disease.  There is no known family history of neurological disease.  06/17/13 Direct LDL 123, TSH 1.71.  PAST MEDICAL HISTORY: Past Medical History  Diagnosis Date  . Migraines   . Anorexia   . Dysphagia   . Hashimoto thyroiditis   . Hypothyroidism   . Lupus   . Fibromyalgia   . Sjogren's disease   . Aneurysm     PAST SURGICAL HISTORY: Past Surgical History  Procedure Laterality Date  . Cholecystectomy    . Abdominal hysterectomy    . Kidney stone    . Cervical rods    . L5-s1 surgery    . Cervical fusion  2004    C4-C5 with Instrumentation  . Cervical fusion  2004    C4-C5  . Lumbar microdiscectomy  2004    Bilateral L5-S1    MEDICATIONS: Current Outpatient Prescriptions on File Prior to Visit  Medication Sig Dispense Refill  . atorvastatin (LIPITOR) 20 MG tablet Take 1 tablet (20 mg total) by mouth daily.  30 tablet  6  . baclofen (LIORESAL) 10 MG tablet Take 10 mg by mouth 3 (three) times daily.        Marland Kitchen estradiol (ESTRACE) 1 MG tablet Take 1 mg by mouth daily.        Marland Kitchen HYDROcodone-acetaminophen (NORCO/VICODIN) 5-325 MG per tablet Take 1 tablet by mouth every 6 (six) hours as needed for  moderate pain.      Marland Kitchen levothyroxine (SYNTHROID, LEVOTHROID) 75 MCG tablet Take 75 mcg by mouth daily.        . meloxicam (MOBIC) 15 MG tablet Take 15 mg by mouth daily as needed for pain.      . methotrexate 2.5 MG tablet Take 2.5 mg by mouth once a week. Take 8 tablets once per week (at the same time).      . predniSONE (DELTASONE) 5 MG tablet Take 5 mg by mouth daily.       No current facility-administered medications on file prior to visit.    ALLERGIES: Allergies  Allergen Reactions  . Plaquenil [Hydroxychloroquine Sulfate]     Skin feels like bee stings    FAMILY HISTORY: Family History  Problem Relation Age of Onset  . Diabetes Sister   . Breast cancer Paternal Aunt     SOCIAL HISTORY: History   Social History  . Marital Status: Married    Spouse Name: N/A    Number of Children: N/A  . Years of Education: N/A   Occupational History  . Not on file.   Social History Main Topics  . Smoking status: Never Smoker   . Smokeless tobacco: Never Used  . Alcohol Use: Not on file  . Drug Use: Not on file  . Sexual Activity: Not on file   Other Topics Concern  . Not on file   Social History Narrative  . No narrative on file    REVIEW OF SYSTEMS: Constitutional: No fevers, chills, or sweats, no generalized fatigue, change in appetite Eyes: No visual changes, double vision, eye pain Ear, nose and throat: No hearing loss, ear pain, nasal congestion, sore throat Cardiovascular: No chest pain, palpitations Respiratory:  No shortness of breath at rest or with exertion, wheezes GastrointestinaI: No nausea, vomiting, diarrhea, abdominal pain, fecal incontinence Genitourinary:  No dysuria, urinary retention or frequency Musculoskeletal:  No neck pain, back pain Integumentary: No rash, pruritus, skin lesions Neurological: as above Psychiatric: No depression, insomnia, anxiety Endocrine: No palpitations, fatigue, diaphoresis, mood swings, change in appetite, change in  weight, increased thirst Hematologic/Lymphatic:  No anemia, purpura, petechiae. Allergic/Immunologic: no itchy/runny eyes, nasal congestion, recent allergic reactions, rashes  PHYSICAL EXAM: Filed Vitals:   07/19/13 1017  BP: 110/70  Pulse: 70  Temp: 97.6 F (36.4 C)  Resp: 16   General: No acute distress Head:  Normocephalic/atraumatic Neck: supple, no paraspinal tenderness, full range of motion Back: No paraspinal tenderness Heart: regular rate and rhythm Lungs: Clear to auscultation bilaterally. Vascular: No carotid bruits. Neurological Exam: Mental status: alert and oriented to person, place, and time, speech fluent and not dysarthric, language intact. Cranial nerves: CN I: not tested CN II: pupils equal, round and reactive to light, visual fields intact, fundi unremarkable. CN III, IV, VI:  full range of motion, no nystagmus, no ptosis CN V: facial sensation intact CN VII: upper and lower face symmetric CN VIII: hearing intact CN IX, X: gag intact,  uvula midline CN XI: sternocleidomastoid and trapezius muscles intact CN XII: tongue midline Bulk & Tone: normal, no fasciculations. Motor: 5/5 throughout.  No rigidity.  No bradykinesia. Sensation: pinprick and vibration intact. Deep Tendon Reflexes: 2+ throughout, toes equivocal Finger to nose testing: right postural non-physiologic tremor noted.  No dysmetria Heel to shin: no dysmetria Gait: normal stance and stride.  Able to turn, walk on toes, heels and in tandem. Romberg negative.  IMPRESSION: 1.  Abnormal MRI of brain.  Findings are progressed compared to prior study from two years ago.  Also, some of the lesions are perpendicular to the lateral ventricle and corpus callosum, which is traditionally found in multiple sclerosis.  However, there does not appear to be any clinical history of attacks or flare-ups (except for the episodes of visual disturbance, which is less likely optic neuritis and may be a migraine aura).   Findings may also be due to vasculitis such as Sjogren's disease as well.  Given the progression of white matter hyperintensities, as well as the pattern of the signal abnormalities, I would agree with Dr. Clarisse Gouge to pursue further workup such as a lumbar puncture.  2.  Tremor.  It was reported by Dr. Modesto Charon that Ms. Ahsan may exhibit findings of possible Parkinson's disease.  On my evaluation, the tremor is mixed, of varying amplitude and does not worsen when preoccupied as would be expected with physiologic tremor (in fact, it improves when distracted).  I don't appreciate other symptoms that would correlate with Parkinson's disease.  I think this is a non-physiologic or psychogenic tremor.  45 minutes spent with patient, over 50% spent reviewing the scans with the patient, discussing differentials and counseling.  Thank you for allowing me to take part in the care of this patient.  Shon Millet, DO  CC:  Neena Rhymes, MD

## 2013-08-10 ENCOUNTER — Other Ambulatory Visit: Payer: Medicare Other

## 2013-08-30 ENCOUNTER — Telehealth: Payer: Self-pay

## 2013-08-30 NOTE — Telephone Encounter (Addendum)
Patient called because she was informed by her daughter that she should.  Patient states that for the past 6 weeks she has been experiencing intermittent chest pain.  She describes the pain as "hard cramps" that radiates to her neck, throat, jaw and "even my tongue."  Accompanying symptoms include sob and anxiety, due to not knowing what's going on.  She denies arm pain, diaphoresis, n/v, or abdominal pain.  States that at the onset of pain, pain ranges from 5-8 out of 10 on the 0-10 pain scale. Pain is relieved with rest and time. Patient states that she does not take any medication to relieve pain.  She denies pain at the time of phone call and has not had any symptoms today.  Patient was advised to go to Urgent Care due to the description of her symptoms.  Patient stated that she did not want to do Urgent Care at this time as she was not experiencing any symptoms.  She shared at this time that her last episode of chest pain was 4 days ago.  However, she agreed to go to Urgent Care or the ED if the symptoms return.    Appointment scheduled with Dr. Beverely Low for 08/31/13 @ 0945 am.

## 2013-08-31 ENCOUNTER — Ambulatory Visit (INDEPENDENT_AMBULATORY_CARE_PROVIDER_SITE_OTHER): Payer: Medicare Other | Admitting: Family Medicine

## 2013-08-31 ENCOUNTER — Encounter: Payer: Self-pay | Admitting: Family Medicine

## 2013-08-31 VITALS — BP 110/74 | HR 93 | Temp 98.2°F | Resp 16 | Wt 134.1 lb

## 2013-08-31 DIAGNOSIS — R0982 Postnasal drip: Secondary | ICD-10-CM | POA: Insufficient documentation

## 2013-08-31 DIAGNOSIS — R079 Chest pain, unspecified: Secondary | ICD-10-CM | POA: Insufficient documentation

## 2013-08-31 MED ORDER — OMEPRAZOLE 40 MG PO CPDR: 40.0000 mg | DELAYED_RELEASE_CAPSULE | Freq: Every day | ORAL | Status: DC

## 2013-08-31 NOTE — Progress Notes (Signed)
   Subjective:    Patient ID: Becky Boyd, female    DOB: Oct 31, 1955, 58 y.o.   MRN: [id number]  HPI Chest pain- intermittent, upper substernal, radiating up into jaw bilaterally and into ear.  Pain described as a severe cramp.  sxs started ~6 weeks ago and has had 5 episodes.  Typically last minutes but 1 episode lasted 60-90 minutes.  Has sensation of being not able to take deep breath, 'it hurts'.  Took vitals during long episode and BP, pulse, and O2 were normal.  No nausea, diaphoresis.  Episodes not related to exertion.  No new meds or changes.  Completely asymptomatic today.  Cough- pt reports that she keeps cough drops in her purse, by her bed, everywhere.  'not a big deal'.  Not productive.  Denies PND.  Hx of seasonal allergies.  Not taking meds.   Review of Systems For ROS see HPI     Objective:   Physical Exam  Constitutional: She is oriented to person, place, and time. She appears well-developed and well-nourished. No distress.  HENT:  Head: Normocephalic and atraumatic.  Right Ear: Tympanic membrane normal.  Left Ear: Tympanic membrane normal.  Nose: Mucosal edema and rhinorrhea present. Right sinus exhibits no maxillary sinus tenderness and no frontal sinus tenderness. Left sinus exhibits no maxillary sinus tenderness and no frontal sinus tenderness.  Mouth/Throat: Mucous membranes are normal. Posterior oropharyngeal erythema (w/ PND) present.  Eyes: Conjunctivae and EOM are normal. Pupils are equal, round, and reactive to light.  Neck: Normal range of motion. Neck supple.  Cardiovascular: Normal rate, regular rhythm, normal heart sounds and intact distal pulses.   Pulmonary/Chest: Effort normal and breath sounds normal. No respiratory distress. She has no wheezes. She has no rales. She exhibits no tenderness.  Lymphadenopathy:    She has no cervical adenopathy.  Neurological: She is alert and oriented to person, place, and time.  Skin: Skin is warm and dry. No erythema.    Psychiatric: She has a normal mood and affect. Her behavior is normal.          Assessment & Plan:

## 2013-08-31 NOTE — Patient Instructions (Signed)
Follow up as needed If you have another episode, please call or go to the ER Start the Omeprazole daily for possible reflux Start Claritin or Zyrtec daily to decrease your postnasal drip and improve your cough We'll call you with your cardiology appt to rule out bad things Call with any questions or concerns Hang in there!!!

## 2013-08-31 NOTE — Assessment & Plan Note (Signed)
New.  Suspect this is cause of pt's ongoing cough.  Start daily OTC antihistamine.  If no improvement, will get CXR.  Pt expressed understanding and is in agreement w/ plan.

## 2013-08-31 NOTE — Progress Notes (Signed)
Pre visit review using our clinic review tool, if applicable. No additional management support is needed unless otherwise documented below in the visit note. 

## 2013-08-31 NOTE — Assessment & Plan Note (Signed)
New.  Atypical.  Difficult b/c pt is completely asymptomatic today.  Differential includes esophageal spasm, musculoskeletal issue, pulmonary, cardiac, or autoimmune as pt has been told she has lupus (although this has been debated).  EKG shows mild T wave changes in V1-2.  Start PPI to r/o esophageal spasm/reflux and refer to cards.  Reviewed supportive care and red flags that should prompt return.  Pt expressed understanding and is in agreement w/ plan.

## 2013-09-10 ENCOUNTER — Other Ambulatory Visit: Payer: Self-pay | Admitting: Neurology

## 2013-09-10 DIAGNOSIS — G43909 Migraine, unspecified, not intractable, without status migrainosus: Secondary | ICD-10-CM

## 2013-09-14 ENCOUNTER — Ambulatory Visit
Admission: RE | Admit: 2013-09-14 | Discharge: 2013-09-14 | Disposition: A | Discharge status: Home / Self Care | Payer: Medicare Other | Source: Ambulatory Visit | Attending: Neurology | Admitting: Neurology

## 2013-09-14 VITALS — BP 121/76 | HR 58

## 2013-09-14 DIAGNOSIS — M545 Low back pain, unspecified: Secondary | ICD-10-CM

## 2013-09-14 DIAGNOSIS — G43909 Migraine, unspecified, not intractable, without status migrainosus: Secondary | ICD-10-CM

## 2013-09-14 DIAGNOSIS — R9089 Other abnormal findings on diagnostic imaging of central nervous system: Secondary | ICD-10-CM

## 2013-09-14 DIAGNOSIS — R413 Other amnesia: Secondary | ICD-10-CM

## 2013-09-14 DIAGNOSIS — H538 Other visual disturbances: Secondary | ICD-10-CM

## 2013-09-14 LAB — GRAM STAIN
GRAM STAIN: NONE SEEN
Gram Stain: NONE SEEN

## 2013-09-14 MED ORDER — DIAZEPAM 5 MG PO TABS
10.0000 mg | ORAL_TABLET | Freq: Once | ORAL | Status: AC
  Administered 2013-09-14: 10 mg via ORAL

## 2013-09-14 NOTE — Progress Notes (Signed)
One tiger-topped tube of blood drawn from left hand for LP labs; site unremarkable.  First two attempts left and right AC spaces with tiny bruises.

## 2013-09-14 NOTE — Discharge Instructions (Signed)

## 2013-09-15 LAB — INDIA INK CSF: India Ink CSF: NONE SEEN

## 2013-09-16 LAB — CSF PANEL 1
Glucose, CSF: 56 mg/dL (ref 43–76)
RBC COUNT CSF: 0 uL
SYPHILIS VDRL QUANT CSF: NONREACTIVE
Total Protein, CSF: 43 mg/dL (ref 15–45)
Tube #: 3
WBC, CSF: 1 cu mm (ref 0–5)

## 2013-09-16 LAB — ANGIOTENSIN CONVERTING ENZYME, CSF: ACE, CSF: 8 U/L (ref <=15)

## 2013-09-19 LAB — MULTIPLE SCLEROSIS PANEL 2
ALBUMIN CSF MSPROF: 17 mg/dL (ref <35)
Albumin Index: 4.8 (ref <9.0)
Albumin: 3560 mg/dL — ABNORMAL LOW (ref 3700–5410)
IGG TOTAL: 944 mg/dL (ref 694–1618)
IGM TOTAL: 67 mg/dL (ref 48–271)
IgA CSF: 0.17 mg/dL (ref 0.15–0.60)
IgA MSPROF: <0.001 mg/dL (ref <0.010)
IgA Total: 117 mg/dL (ref 81–463)
IgG Total CSF: 2.4 mg/dL (ref 0.5–6.1)
IgG-Index: 0.53 (ref <0.70)

## 2013-10-01 ENCOUNTER — Ambulatory Visit: Payer: Medicare Other | Admitting: Cardiology

## 2013-10-09 ENCOUNTER — Emergency Department (HOSPITAL_BASED_OUTPATIENT_CLINIC_OR_DEPARTMENT_OTHER): Payer: Medicare Other

## 2013-10-09 ENCOUNTER — Emergency Department (HOSPITAL_BASED_OUTPATIENT_CLINIC_OR_DEPARTMENT_OTHER)
Admission: EM | Admit: 2013-10-09 | Discharge: 2013-10-10 | Disposition: A | Discharge status: Home / Self Care | Payer: Medicare Other | Attending: Emergency Medicine | Admitting: Emergency Medicine

## 2013-10-09 ENCOUNTER — Encounter (HOSPITAL_BASED_OUTPATIENT_CLINIC_OR_DEPARTMENT_OTHER): Payer: Self-pay | Admitting: Emergency Medicine

## 2013-10-09 DIAGNOSIS — E063 Autoimmune thyroiditis: Secondary | ICD-10-CM | POA: Insufficient documentation

## 2013-10-09 DIAGNOSIS — Y9389 Activity, other specified: Secondary | ICD-10-CM | POA: Insufficient documentation

## 2013-10-09 DIAGNOSIS — IMO0001 Reserved for inherently not codable concepts without codable children: Secondary | ICD-10-CM | POA: Insufficient documentation

## 2013-10-09 DIAGNOSIS — S79929A Unspecified injury of unspecified thigh, initial encounter: Principal | ICD-10-CM

## 2013-10-09 DIAGNOSIS — W010XXA Fall on same level from slipping, tripping and stumbling without subsequent striking against object, initial encounter: Secondary | ICD-10-CM | POA: Insufficient documentation

## 2013-10-09 DIAGNOSIS — Z791 Long term (current) use of non-steroidal anti-inflammatories (NSAID): Secondary | ICD-10-CM | POA: Insufficient documentation

## 2013-10-09 DIAGNOSIS — IMO0002 Reserved for concepts with insufficient information to code with codable children: Secondary | ICD-10-CM | POA: Insufficient documentation

## 2013-10-09 DIAGNOSIS — M7918 Myalgia, other site: Secondary | ICD-10-CM

## 2013-10-09 DIAGNOSIS — Y9289 Other specified places as the place of occurrence of the external cause: Secondary | ICD-10-CM | POA: Insufficient documentation

## 2013-10-09 DIAGNOSIS — S4980XA Other specified injuries of shoulder and upper arm, unspecified arm, initial encounter: Secondary | ICD-10-CM | POA: Insufficient documentation

## 2013-10-09 DIAGNOSIS — W19XXXA Unspecified fall, initial encounter: Secondary | ICD-10-CM

## 2013-10-09 DIAGNOSIS — G43909 Migraine, unspecified, not intractable, without status migrainosus: Secondary | ICD-10-CM | POA: Insufficient documentation

## 2013-10-09 DIAGNOSIS — M329 Systemic lupus erythematosus, unspecified: Secondary | ICD-10-CM | POA: Insufficient documentation

## 2013-10-09 DIAGNOSIS — S79919A Unspecified injury of unspecified hip, initial encounter: Secondary | ICD-10-CM | POA: Insufficient documentation

## 2013-10-09 DIAGNOSIS — Z8679 Personal history of other diseases of the circulatory system: Secondary | ICD-10-CM | POA: Insufficient documentation

## 2013-10-09 DIAGNOSIS — S46909A Unspecified injury of unspecified muscle, fascia and tendon at shoulder and upper arm level, unspecified arm, initial encounter: Secondary | ICD-10-CM | POA: Insufficient documentation

## 2013-10-09 DIAGNOSIS — E039 Hypothyroidism, unspecified: Secondary | ICD-10-CM | POA: Insufficient documentation

## 2013-10-09 DIAGNOSIS — Z79899 Other long term (current) drug therapy: Secondary | ICD-10-CM | POA: Insufficient documentation

## 2013-10-09 MED ORDER — HYDROCODONE-ACETAMINOPHEN 5-325 MG PO TABS
1.0000 | ORAL_TABLET | Freq: Once | ORAL | Status: AC
  Administered 2013-10-09: 1 via ORAL
  Filled 2013-10-09: qty 1

## 2013-10-09 NOTE — ED Provider Notes (Signed)
CSN: [id number]     Arrival date & time 10/09/13  2039 History   First MD Initiated Contact with Patient 10/09/13 2224     Chief Complaint  Patient presents with  . Fall     (Consider location/radiation/quality/duration/timing/severity/associated sxs/prior Treatment) Patient is a 58 y.o. female presenting with fall. The history is provided by the patient. No language interpreter was used.  Fall This is a new problem. The current episode started in the past 7 days. Pertinent negatives include no abdominal pain, chest pain, chills, fever or headaches. Associated symptoms comments: She has right sided pain since slipping on steps and falling 3 days ago. She landed on her right hip and on outstretched right arm. She denies head injury. There was no dizziness or chest pain prior to the fall. She has been ambulatory since the fall and reports she came in tonight because she did not feel she was getting better. No nausea, vomiting..    Past Medical History  Diagnosis Date  . Migraines   . Anorexia   . Dysphagia   . Hashimoto thyroiditis   . Hypothyroidism   . Lupus   . Fibromyalgia   . Sjogren's disease   . Aneurysm    Past Surgical History  Procedure Laterality Date  . Cholecystectomy    . Abdominal hysterectomy    . Kidney stone    . Cervical rods    . L5-s1 surgery    . Cervical fusion  2004    C4-C5 with Instrumentation  . Cervical fusion  2004    C4-C5  . Lumbar microdiscectomy  2004    Bilateral L5-S1   Family History  Problem Relation Age of Onset  . Diabetes Sister   . Breast cancer Paternal Aunt    History  Substance Use Topics  . Smoking status: Never Smoker   . Smokeless tobacco: Never Used  . Alcohol Use: Not on file   OB History   Grav Para Term Preterm Abortions TAB SAB Ect Mult Living                 Review of Systems  Constitutional: Negative for fever and chills.  Respiratory: Negative.  Negative for shortness of breath.   Cardiovascular: Negative.   Negative for chest pain.  Gastrointestinal: Negative.  Negative for abdominal pain.  Musculoskeletal:       See HPI.  Skin: Negative.  Negative for wound.  Neurological: Negative.  Negative for headaches.      Allergies  Plaquenil  Home Medications   Prior to Admission medications   Medication Sig Start Date End Date Taking? Authorizing Provider  atorvastatin (LIPITOR) 20 MG tablet Take 1 tablet (20 mg total) by mouth daily. 06/18/13   Sheliah Hatch, MD  baclofen (LIORESAL) 10 MG tablet Take 10 mg by mouth 3 (three) times daily.      Historical Provider, MD  estradiol (ESTRACE) 1 MG tablet Take 1 mg by mouth daily.      Historical Provider, MD  HYDROcodone-acetaminophen (NORCO/VICODIN) 5-325 MG per tablet Take 1 tablet by mouth every 6 (six) hours as needed for moderate pain.    Historical Provider, MD  levothyroxine (SYNTHROID, LEVOTHROID) 75 MCG tablet Take 75 mcg by mouth daily.      Historical Provider, MD  meloxicam (MOBIC) 15 MG tablet Take 15 mg by mouth daily as needed for pain.    Historical Provider, MD  methotrexate 2.5 MG tablet Take 2.5 mg by mouth once a week. Take 8  tablets once per week (at the same time).    Historical Provider, MD  omeprazole (PRILOSEC) 40 MG capsule Take 1 capsule (40 mg total) by mouth daily. 08/31/13   Sheliah Hatch, MD  predniSONE (DELTASONE) 5 MG tablet Take 5 mg by mouth daily.    Historical Provider, MD   BP 110/72  Pulse 81  Temp(Src) 98.3 F (36.8 C) (Oral)  Resp 18  SpO2 97% Physical Exam  Constitutional: She appears well-developed and well-nourished. No distress.  HENT:  Head: Atraumatic.  Neck: Normal range of motion.  Cardiovascular: Normal rate.   No murmur heard. Pulmonary/Chest: Effort normal. She has no wheezes. She has no rales. She exhibits no tenderness.  Abdominal: There is no tenderness. There is no rebound and no guarding.  Musculoskeletal: Normal range of motion.  Right hip: tender laterally without  palpable bony deformity. Lower lumbar tenderness on right without swelling or discoloration. No midline spinal tenderness, including cervical spine. Right shoulder has full ROM, with painful abduction. No swelling, deformities. No point clavicular or scapular tenderness. Grips 5/5.     ED Course  Procedures (including critical care time) Labs Review Labs Reviewed - No data to display  Imaging Review Dg Lumbar Spine Complete  10/09/2013   CLINICAL DATA:  Fall.  EXAM: LUMBAR SPINE - COMPLETE 4+ VIEW  COMPARISON:  11/21/2011  FINDINGS: No acute fracture or traumatic subluxation. Grade 1 L4-5 anterolisthesis associated with mild facet degenerative changes. Facet spurring also present at L5-S1. Cholecystectomy changes.  IMPRESSION: 1. No acute osseous injury. 2. Lower lumbar facet osteoarthritis with chronic, mild L4-5 anterolisthesis.   Electronically Signed   By: Tiburcio Pea M.D.   On: 10/09/2013 23:47   Dg Shoulder Right  10/09/2013   CLINICAL DATA:  Fall with shoulder pain  EXAM: RIGHT SHOULDER - 2+ VIEW  COMPARISON:  None.  FINDINGS: No acute fracture. Located acromioclavicular and glenohumeral joints. Amorphous density associated with the rotator cuff is most consistent with calcific tendinitis. No significant joint narrowing.  IMPRESSION: No acute osseous injury.  Calcific tendinitis of the rotator cuff.   Electronically Signed   By: Tiburcio Pea M.D.   On: 10/09/2013 23:36   Dg Hip Complete Right  10/09/2013   CLINICAL DATA:  Fall.  EXAM: RIGHT HIP - COMPLETE 2+ VIEW  COMPARISON:  11/11/2005 right hip radiograph  FINDINGS: There is no evidence of hip fracture or dislocation. There is no evidence of arthropathy or other focal bone abnormality.  IMPRESSION: Negative.   Electronically Signed   By: Tiburcio Pea M.D.   On: 10/09/2013 23:37     EKG Interpretation None      MDM   Final diagnoses:  None    1. Fall 2. Musculoskeletal pain  X-rays negative for fracture injuries. She  is getting mild relief with pain medication. She appears stable, NAD    Edrie Ehrich A Tayvion Lauder, PA-C 10/10/13 0000

## 2013-10-09 NOTE — ED Notes (Signed)
Pt reports she fell down 2 - 3 steps landing on cement floor and down has right neck shoulder arm and hip pain. Ambulated with steady gait

## 2013-10-10 MED ORDER — HYDROCODONE-ACETAMINOPHEN 5-325 MG PO TABS: 1.0000 | ORAL_TABLET | Freq: Once | ORAL | Status: DC

## 2013-10-10 NOTE — Discharge Instructions (Signed)
Cryotherapy °Cryotherapy means treatment with cold. Ice or gel packs can be used to reduce both pain and swelling. Ice is the most helpful within the first 24 to 48 hours after an injury or flareup from overusing a muscle or joint. Sprains, strains, spasms, burning pain, shooting pain, and aches can all be eased with ice. Ice can also be used when recovering from surgery. Ice is effective, has very few side effects, and is safe for most people to use. °PRECAUTIONS  °Ice is not a safe treatment option for people with: °· Raynaud's phenomenon. This is a condition affecting small blood vessels in the extremities. Exposure to cold may cause your problems to return. °· Cold hypersensitivity. There are many forms of cold hypersensitivity, including: °· Cold urticaria. Red, itchy hives appear on the skin when the tissues begin to warm after being iced. °· Cold erythema. This is a red, itchy rash caused by exposure to cold. °· Cold hemoglobinuria. Red blood cells break down when the tissues begin to warm after being iced. The hemoglobin that carry oxygen are passed into the urine because they cannot combine with blood proteins fast enough. °· Numbness or altered sensitivity in the area being iced. °If you have any of the following conditions, do not use ice until you have discussed cryotherapy with your caregiver: °· Heart conditions, such as arrhythmia, angina, or chronic heart disease. °· High blood pressure. °· Healing wounds or open skin in the area being iced. °· Current infections. °· Rheumatoid arthritis. °· Poor circulation. °· Diabetes. °Ice slows the blood flow in the region it is applied. This is beneficial when trying to stop inflamed tissues from spreading irritating chemicals to surrounding tissues. However, if you expose your skin to cold temperatures for too long or without the proper protection, you can damage your skin or nerves. Watch for signs of skin damage due to cold. °HOME CARE INSTRUCTIONS °Follow  these tips to use ice and cold packs safely. °· Place a dry or damp towel between the ice and skin. A damp towel will cool the skin more quickly, so you may need to shorten the time that the ice is used. °· For a more rapid response, add gentle compression to the ice. °· Ice for no more than 10 to 20 minutes at a time. The bonier the area you are icing, the less time it will take to get the benefits of ice. °· Check your skin after 5 minutes to make sure there are no signs of a poor response to cold or skin damage. °· Rest 20 minutes or more in between uses. °· Once your skin is numb, you can end your treatment. You can test numbness by very lightly touching your skin. The touch should be so light that you do not see the skin dimple from the pressure of your fingertip. When using ice, most people will feel these normal sensations in this order: cold, burning, aching, and numbness. °· Do not use ice on someone who cannot communicate their responses to pain, such as small children or people with dementia. °HOW TO MAKE AN ICE PACK °Ice packs are the most common way to use ice therapy. Other methods include ice massage, ice baths, and cryo-sprays. Muscle creams that cause a cold, tingly feeling do not offer the same benefits that ice offers and should not be used as a substitute unless recommended by your caregiver. °To make an ice pack, do one of the following: °· Place crushed ice or   a bag of frozen vegetables in a sealable plastic bag. Squeeze out the excess air. Place this bag inside another plastic bag. Slide the bag into a pillowcase or place a damp towel between your skin and the bag.  Mix 3 parts water with 1 part rubbing alcohol. Freeze the mixture in a sealable plastic bag. When you remove the mixture from the freezer, it will be slushy. Squeeze out the excess air. Place this bag inside another plastic bag. Slide the bag into a pillowcase or place a damp towel between your skin and the bag. SEEK MEDICAL  CARE IF:  You develop white spots on your skin. This may give the skin a blotchy (mottled) appearance.  Your skin turns blue or pale.  Your skin becomes waxy or hard.  Your swelling gets worse. MAKE SURE YOU:   Understand these instructions.  Will watch your condition.  Will get help right away if you are not doing well or get worse. Document Released: 01/14/2011 Document Revised: 08/12/2011 Document Reviewed: 01/14/2011 [facility] Patient Information 2014 [place], [place]. Myalgia, Adult Myalgia is the medical term for muscle pain. It is a symptom of many things. Nearly everyone at some time in their life has this. The most common cause for muscle pain is overuse or straining and more so when you are not in shape. Injuries and muscle bruises cause myalgias. Muscle pain without a history of injury can also be caused by a virus. It frequently comes along with the flu. Myalgia not caused by muscle strain can be present in a large number of infectious diseases. Some autoimmune diseases like lupus and fibromyalgia can cause muscle pain. Myalgia may be mild, or severe. SYMPTOMS  The symptoms of myalgia are simply muscle pain. Most of the time this is short lived and the pain goes away without treatment. DIAGNOSIS  Myalgia is diagnosed by your caregiver by taking your history. This means you tell him when the problems began, what they are, and what has been happening. If this has not been a long term problem, your caregiver may want to watch for a while to see what will happen. If it has been long term, they may want to do additional testing. TREATMENT  The treatment depends on what the underlying cause of the muscle pain is. Often anti-inflammatory medications will help. HOME CARE INSTRUCTIONS  If the pain in your muscles came from overuse, slow down your activities until the problems go away.  Myalgia from overuse of a muscle can be treated with alternating hot and cold packs on the muscle  affected or with cold for the first couple days. If either heat or cold seems to make things worse, stop their use.  Apply ice to the sore area for 15-20 minutes, 03-04 times per day, while awake for the first 2 days of muscle soreness, or as directed. Put the ice in a plastic bag and place a towel between the bag of ice and your skin.  Only take over-the-counter or prescription medicines for pain, discomfort, or fever as directed by your caregiver.  Regular gentle exercise may help if you are not active.  Stretching before strenuous exercise can help lower the risk of myalgia. It is normal when beginning an exercise regimen to feel some muscle pain after exercising. Muscles that have not been used frequently will be sore at first. If the pain is extreme, this may mean injury to a muscle. SEEK MEDICAL CARE IF:  You have an increase in muscle pain  that is not relieved with medication.  You begin to run a temperature.  You develop nausea and vomiting.  You develop a stiff and painful neck.  You develop a rash.  You develop muscle pain after a tick bite.  You have continued muscle pain while working out even after you are in good condition. SEEK IMMEDIATE MEDICAL CARE IF: Any of your problems are getting worse and medications are not helping. MAKE SURE YOU:   Understand these instructions.  Will watch your condition.  Will get help right away if you are not doing well or get worse. Document Released: 04/11/2006 Document Revised: 08/12/2011 Document Reviewed: 07/01/2006 [facility] Patient Information 2014 [place], [place].

## 2013-10-10 NOTE — ED Provider Notes (Signed)
Medical screening examination/treatment/procedure(s) were performed by non-physician practitioner and as supervising physician I was immediately available for consultation/collaboration.   EKG Interpretation None        Quantavia Frith B. Bernette Mayers, MD 10/10/13 [phone]

## 2013-10-11 MED ORDER — HYDROCODONE-ACETAMINOPHEN 5-325 MG PO TABS: 1.0000 | ORAL_TABLET | Freq: Four times a day (QID) | ORAL | Status: DC | PRN

## 2013-10-11 NOTE — ED Notes (Signed)
Patient called to state that she received a prescription from [place] on 10/09/13 for vicodin for pain.  States [facility] would not fill prescription due to incorrect directions. Confirmed with [facility] pharmacy that they kept the old prescription.  Chart reviewed by Dr. Loretha Stapler.  Rewrote prescription.  Patient came in and picked up there prescription.

## 2013-10-11 NOTE — ED Provider Notes (Signed)
Initial [facility] prescription printed with inaccurate frequency.  I have printed a new prescription.  Pharmacy has kept old prescription.    Candyce Churn III, MD 10/11/13 [phone]

## 2013-10-21 ENCOUNTER — Encounter: Payer: Self-pay | Admitting: Family Medicine

## 2013-10-21 ENCOUNTER — Ambulatory Visit (INDEPENDENT_AMBULATORY_CARE_PROVIDER_SITE_OTHER): Payer: Medicare Other | Admitting: Family Medicine

## 2013-10-21 VITALS — BP 126/78 | HR 95 | Temp 98.2°F | Resp 16 | Wt 138.0 lb

## 2013-10-21 DIAGNOSIS — M25519 Pain in unspecified shoulder: Secondary | ICD-10-CM

## 2013-10-21 NOTE — Progress Notes (Signed)
Pre visit review using our clinic review tool, if applicable. No additional management support is needed unless otherwise documented below in the visit note. 

## 2013-10-21 NOTE — Patient Instructions (Signed)
Follow up as needed We'll call you with your ortho appt Continue the Hydrocodone, Robaxin and Meloxicam Alternate heat/ice for pain relief Call with any questions or concerns Happy [facility] Day!!!

## 2013-10-21 NOTE — Progress Notes (Signed)
   Subjective:    Patient ID: Becky Boyd, female    DOB: 03-31-1956, 58 y.o.   MRN: [id number]  HPI R Shoulder pain- 'i don't know if it's my neck, my shoulder, or my back'.   2 weeks ago pt fell and jammed arm into ground- had xrays at that time and no evidence of fx.  Has hx of 2 ruptured discs in neck which manifested as shoulder pain.  Unable to raise arm or pull it across body.  Pain radiates down top/front of arm and also down the back.  Pt has hydrocodone, robaxin and mobic w/ some relief.   Review of Systems For ROS see HPI     Objective:   Physical Exam  Vitals reviewed. Constitutional: She is oriented to person, place, and time. She appears well-developed and well-nourished. No distress.  Neck: Normal range of motion. Neck supple.  Musculoskeletal: She exhibits no edema.  R shoulder w/ very limited ROM due to pain + impingement signs Decreased internal and external rotation Decreased forward flexion and abduction  Neurological: She is alert and oriented to person, place, and time. She has normal reflexes. No cranial nerve deficit.  Skin: Skin is warm and dry.          Assessment & Plan:

## 2013-10-26 NOTE — Assessment & Plan Note (Signed)
New.  Pt w/ pain and limited ROM at R shoulder.  Doesn't appear to be related to her neck.  Pt w/ pain meds, NSAIDs, and muscle relaxers already available.  Refer to ortho for evaluation and tx.  Pt expressed understanding and is in agreement w/ plan.

## 2013-12-25 ENCOUNTER — Encounter (HOSPITAL_BASED_OUTPATIENT_CLINIC_OR_DEPARTMENT_OTHER): Payer: Self-pay | Admitting: Emergency Medicine

## 2013-12-25 ENCOUNTER — Emergency Department (HOSPITAL_BASED_OUTPATIENT_CLINIC_OR_DEPARTMENT_OTHER)
Admission: EM | Admit: 2013-12-25 | Discharge: 2013-12-25 | Disposition: A | Discharge status: Home / Self Care | Payer: Medicare Other | Attending: Emergency Medicine | Admitting: Emergency Medicine

## 2013-12-25 DIAGNOSIS — L509 Urticaria, unspecified: Secondary | ICD-10-CM | POA: Diagnosis not present

## 2013-12-25 DIAGNOSIS — Z79899 Other long term (current) drug therapy: Secondary | ICD-10-CM | POA: Diagnosis not present

## 2013-12-25 DIAGNOSIS — Z8739 Personal history of other diseases of the musculoskeletal system and connective tissue: Secondary | ICD-10-CM | POA: Diagnosis not present

## 2013-12-25 DIAGNOSIS — Z8679 Personal history of other diseases of the circulatory system: Secondary | ICD-10-CM | POA: Diagnosis not present

## 2013-12-25 DIAGNOSIS — G43909 Migraine, unspecified, not intractable, without status migrainosus: Secondary | ICD-10-CM | POA: Diagnosis not present

## 2013-12-25 DIAGNOSIS — R21 Rash and other nonspecific skin eruption: Secondary | ICD-10-CM | POA: Insufficient documentation

## 2013-12-25 DIAGNOSIS — Z791 Long term (current) use of non-steroidal anti-inflammatories (NSAID): Secondary | ICD-10-CM | POA: Diagnosis not present

## 2013-12-25 DIAGNOSIS — E039 Hypothyroidism, unspecified: Secondary | ICD-10-CM | POA: Insufficient documentation

## 2013-12-25 MED ORDER — EPINEPHRINE HCL 1 MG/ML IJ SOLN
INTRAMUSCULAR | Status: AC
  Administered 2013-12-25: 0.3 mg
  Filled 2013-12-25: qty 1

## 2013-12-25 MED ORDER — DIPHENHYDRAMINE HCL 25 MG PO TABS: 25.0000 mg | ORAL_TABLET | Freq: Four times a day (QID) | ORAL | Status: DC

## 2013-12-25 MED ORDER — PREDNISONE 50 MG PO TABS
60.0000 mg | ORAL_TABLET | Freq: Once | ORAL | Status: AC
  Administered 2013-12-25: 60 mg via ORAL
  Filled 2013-12-25 (×2): qty 1

## 2013-12-25 MED ORDER — PREDNISONE 20 MG PO TABS: 60.0000 mg | ORAL_TABLET | Freq: Every day | ORAL | Status: DC

## 2013-12-25 MED ORDER — HYDROXYZINE HCL 25 MG PO TABS
25.0000 mg | ORAL_TABLET | Freq: Once | ORAL | Status: AC
  Administered 2013-12-25: 25 mg via ORAL
  Filled 2013-12-25: qty 1

## 2013-12-25 MED ORDER — FAMOTIDINE 20 MG PO TABS: 20.0000 mg | ORAL_TABLET | Freq: Two times a day (BID) | ORAL | Status: DC

## 2013-12-25 MED ORDER — PREDNISONE 10 MG PO TABS
ORAL_TABLET | ORAL | Status: AC
  Filled 2013-12-25: qty 1

## 2013-12-25 MED ORDER — EPINEPHRINE 0.3 MG/0.3ML IJ SOAJ
0.3000 mg | Freq: Once | INTRAMUSCULAR | Status: DC
  Filled 2013-12-25: qty 0.6

## 2013-12-25 NOTE — ED Notes (Signed)
Pt reports hives that started Wednesday-reports that she just got back from the beach today.  No respiratory distress.

## 2013-12-25 NOTE — ED Provider Notes (Signed)
Medical screening examination/treatment/procedure(s) were performed by non-physician practitioner and as supervising physician I was immediately available for consultation/collaboration.     Geoffery Lyons, MD 12/25/13 2144

## 2013-12-25 NOTE — ED Notes (Signed)
Pt reports rash on extremities chest and back that is reddened and raised and itching is very pronounced. Pt states benadryl, zyrtec, cortizone cream and benadryl cream were ineffective for relief of icthing

## 2013-12-25 NOTE — ED Provider Notes (Signed)
CSN: [id number]     Arrival date & time 12/25/13  1845 History   First MD Initiated Contact with Patient 12/25/13 1859     Chief Complaint  Patient presents with  . Urticaria     (Consider location/radiation/quality/duration/timing/severity/associated sxs/prior Treatment) Patient is a 58 y.o. female presenting with urticaria. The history is provided by the patient. No language interpreter was used.  Urticaria This is a new problem. The current episode started in the past 7 days. The problem occurs constantly. Associated symptoms include a rash. Pertinent negatives include no fever. Associated symptoms comments: Generalized itching and rash that started while at the beach 3 days ago. She has taken Benadryl, Zyrtec without relief. No SOB or difficulty swallowing..    Past Medical History  Diagnosis Date  . Migraines   . Anorexia   . Dysphagia   . Hashimoto thyroiditis   . Hypothyroidism   . Lupus   . Fibromyalgia   . Sjogren's disease   . Aneurysm    Past Surgical History  Procedure Laterality Date  . Cholecystectomy    . Abdominal hysterectomy    . Kidney stone    . Cervical rods    . L5-s1 surgery    . Cervical fusion  2004    C4-C5 with Instrumentation  . Cervical fusion  2004    C4-C5  . Lumbar microdiscectomy  2004    Bilateral L5-S1   Family History  Problem Relation Age of Onset  . Diabetes Sister   . Breast cancer Paternal Aunt    History  Substance Use Topics  . Smoking status: Never Smoker   . Smokeless tobacco: Never Used  . Alcohol Use: Not on file   OB History   Grav Para Term Preterm Abortions TAB SAB Ect Mult Living                 Review of Systems  Constitutional: Negative for fever.  Respiratory: Negative for shortness of breath.   Skin: Positive for rash.      Allergies  Plaquenil  Home Medications   Prior to Admission medications   Medication Sig Start Date End Date Taking? Authorizing Provider  atorvastatin (LIPITOR) 20 MG  tablet Take 1 tablet (20 mg total) by mouth daily. 06/18/13   Sheliah Hatch, MD  baclofen (LIORESAL) 10 MG tablet Take 10 mg by mouth 3 (three) times daily.      Historical Provider, MD  HYDROcodone-acetaminophen (NORCO/VICODIN) 5-325 MG per tablet Take 1 tablet by mouth every 6 (six) hours as needed for moderate pain.    Historical Provider, MD  HYDROcodone-acetaminophen (NORCO/VICODIN) 5-325 MG per tablet Take 1-2 tablets by mouth every 6 (six) hours as needed. 10/11/13   Candyce Churn III, MD  levothyroxine (SYNTHROID, LEVOTHROID) 75 MCG tablet Take 75 mcg by mouth daily.      Historical Provider, MD  meloxicam (MOBIC) 15 MG tablet Take 15 mg by mouth daily as needed for pain.    Historical Provider, MD  methotrexate 2.5 MG tablet Take 2.5 mg by mouth once a week. Take 8 tablets once per week (at the same time).    Historical Provider, MD  predniSONE (DELTASONE) 5 MG tablet Take 5 mg by mouth daily.    Historical Provider, MD   BP 121/82  Pulse 91  Temp(Src) 98.2 F (36.8 C) (Oral)  Resp 18  Ht 5\' 3"  (1.6 m)  Wt 135 lb (61.236 kg)  BMI 23.92 kg/m2  SpO2 100% Physical Exam  Constitutional: She is oriented to person, place, and time. She appears well-developed and well-nourished. No distress.  HENT:  Head: Normocephalic.  Pulmonary/Chest: Effort normal.  Neurological: She is alert and oriented to person, place, and time.  Skin: Skin is warm and dry.  Maculopapular rash in generalized distribution with severe itching.   Psychiatric: She has a normal mood and affect.    ED Course  Procedures (including critical care time) Labs Review Labs Reviewed - No data to display  Imaging Review No results found.   EKG Interpretation None      MDM   Final diagnoses:  None    1. Urticaria  Symptoms improved with Epi, benadryl and Prednisone. Will continue medications (except Epi) for three additional days. Rash is in the distribution of sun exposed areas leaving the  possibility the her reaction is to the sunscreen used just prior to onset of rash. Discussed this with patient and husband.     Arnoldo Hooker, PA-C 12/25/13 2029

## 2013-12-25 NOTE — Discharge Instructions (Signed)

## 2014-02-24 ENCOUNTER — Other Ambulatory Visit: Payer: Self-pay | Admitting: Family Medicine

## 2014-02-24 NOTE — Telephone Encounter (Signed)
Med filled.  

## 2014-02-28 ENCOUNTER — Other Ambulatory Visit: Payer: Self-pay | Admitting: Family Medicine

## 2014-02-28 NOTE — Telephone Encounter (Signed)
Med filled.  

## 2014-03-18 ENCOUNTER — Other Ambulatory Visit: Payer: Self-pay

## 2014-04-01 ENCOUNTER — Encounter: Payer: Self-pay | Admitting: Medical

## 2014-04-01 ENCOUNTER — Ambulatory Visit (INDEPENDENT_AMBULATORY_CARE_PROVIDER_SITE_OTHER): Payer: Medicare Other | Admitting: Medical

## 2014-04-01 VITALS — BP 118/80 | HR 88 | Temp 98.4°F | Ht 62.0 in | Wt 140.6 lb

## 2014-04-01 DIAGNOSIS — Z23 Encounter for immunization: Secondary | ICD-10-CM

## 2014-04-01 DIAGNOSIS — E2839 Other primary ovarian failure: Secondary | ICD-10-CM

## 2014-04-01 DIAGNOSIS — Z1231 Encounter for screening mammogram for malignant neoplasm of breast: Secondary | ICD-10-CM

## 2014-04-01 DIAGNOSIS — E785 Hyperlipidemia, unspecified: Secondary | ICD-10-CM

## 2014-04-01 MED ORDER — TETANUS-DIPHTH-ACELL PERTUSSIS 5-2.5-18.5 LF-MCG/0.5 IM SUSP: 0.5000 mL | Freq: Once | INTRAMUSCULAR | Status: DC

## 2014-04-01 MED ORDER — ZOSTER VACCINE LIVE 19400 UNT/0.65ML ~~LOC~~ SOLR: 0.6500 mL | Freq: Once | SUBCUTANEOUS | Status: DC

## 2014-04-01 NOTE — Patient Instructions (Addendum)
Preventive Care for Adults A healthy lifestyle and preventive care can promote health and wellness. Preventive health guidelines for women include the following key practices. A routine yearly physical is a good way to check with your health care provider about your health and preventive screening. It is a chance to share any concerns and updates on your health and to receive a thorough exam. Visit your dentist for a routine exam and preventive care every 6 months. Brush your teeth twice a day and floss once a day. Good oral hygiene prevents tooth decay and gum disease. The frequency of eye exams is based on your age, health, family medical history, use of contact lenses, and other factors. Follow your health care provider's recommendations for frequency of eye exams. Eat a healthy diet. Foods like vegetables, fruits, whole grains, low-fat dairy products, and lean protein foods contain the nutrients you need without too many calories. Decrease your intake of foods high in solid fats, added sugars, and salt. Eat the right amount of calories for you.Get information about a proper diet from your health care provider, if necessary. Regular physical exercise is one of the most important things you can do for your health. Most adults should get at least 150 minutes of moderate-intensity exercise (any activity that increases your heart rate and causes you to sweat) each week. In addition, most adults need muscle-strengthening exercises on 2 or more days a week. Maintain a healthy weight. The body mass index (BMI) is a screening tool to identify possible weight problems. It provides an estimate of body fat based on height and weight. Your health care provider can find your BMI and can help you achieve or maintain a healthy weight.For adults 20 years and older: A BMI below 18.5 is considered underweight. A BMI of 18.5 to 24.9 is normal. A BMI of 25 to 29.9 is considered overweight. A BMI of 30 and above is  considered obese. Maintain normal blood lipids and cholesterol levels by exercising and minimizing your intake of saturated fat. Eat a balanced diet with plenty of fruit and vegetables. Blood tests for lipids and cholesterol should begin at age 90 and be repeated every 5 years. If your lipid or cholesterol levels are high, you are over 50, or you are at high risk for heart disease, you may need your cholesterol levels checked more frequently.Ongoing high lipid and cholesterol levels should be treated with medicines if diet and exercise are not working. If you smoke, find out from your health care provider how to quit. If you do not use tobacco, do not start. Lung cancer screening is recommended for adults aged 69-80 years who are at high risk for developing lung cancer because of a history of smoking. A yearly low-dose CT scan of the lungs is recommended for people who have at least a 30-pack-year history of smoking and are a current smoker or have quit within the past 15 years. A pack year of smoking is smoking an average of 1 pack of cigarettes a day for 1 year (for example: 1 pack a day for 30 years or 2 packs a day for 15 years). Yearly screening should continue until the smoker has stopped smoking for at least 15 years. Yearly screening should be stopped for people who develop a health problem that would prevent them from having lung cancer treatment. If you are pregnant, do not drink alcohol. If you are breastfeeding, be very cautious about drinking alcohol. If you are not pregnant and choose to  drink alcohol, do not have more than 1 drink per day. One drink is considered to be 12 ounces (355 mL) of beer, 5 ounces (148 mL) of wine, or 1.5 ounces (44 mL) of liquor. Avoid use of street drugs. Do not share needles with anyone. Ask for help if you need support or instructions about stopping the use of drugs. High blood pressure causes heart disease and increases the risk of stroke. Your blood pressure  should be checked at least every 1 to 2 years. Ongoing high blood pressure should be treated with medicines if weight loss and exercise do not work. If you are 92-8 years old, ask your health care provider if you should take aspirin to prevent strokes. Diabetes screening involves taking a blood sample to check your fasting blood sugar level. This should be done once every 3 years, after age 37, if you are within normal weight and without risk factors for diabetes. Testing should be considered at a younger age or be carried out more frequently if you are overweight and have at least 1 risk factor for diabetes. Breast cancer screening is essential preventive care for women. You should practice "breast self-awareness." This means understanding the normal appearance and feel of your breasts and may include breast self-examination. Any changes detected, no matter how small, should be reported to a health care provider. Women in their 37s and 30s should have a clinical breast exam (CBE) by a health care provider as part of a regular health exam every 1 to 3 years. After age 7, women should have a CBE every year. Starting at age 75, women should consider having a mammogram (breast X-ray test) every year. Women who have a family history of breast cancer should talk to their health care provider about genetic screening. Women at a high risk of breast cancer should talk to their health care providers about having an MRI and a mammogram every year. Breast cancer gene (BRCA)-related cancer risk assessment is recommended for women who have family members with BRCA-related cancers. BRCA-related cancers include breast, ovarian, tubal, and peritoneal cancers. Having family members with these cancers may be associated with an increased risk for harmful changes (mutations) in the breast cancer genes BRCA1 and BRCA2. Results of the assessment will determine the need for genetic counseling and BRCA1 and BRCA2 testing. Routine  pelvic exams to screen for cancer are no longer recommended for nonpregnant women who are considered low risk for cancer of the pelvic organs (ovaries, uterus, and vagina) and who do not have symptoms. Ask your health care provider if a screening pelvic exam is right for you. If you have had past treatment for cervical cancer or a condition that could lead to cancer, you need Pap tests and screening for cancer for at least 20 years after your treatment. If Pap tests have been discontinued, your risk factors (such as having a new sexual partner) need to be reassessed to determine if screening should be resumed. Some women have medical problems that increase the chance of getting cervical cancer. In these cases, your health care provider may recommend more frequent screening and Pap tests. The HPV test is an additional test that may be used for cervical cancer screening. The HPV test looks for the virus that can cause the cell changes on the cervix. The cells collected during the Pap test can be tested for HPV. The HPV test could be used to screen women aged 14 years and older, and should be used in women  of any age who have unclear Pap test results. After the age of 70, women should have HPV testing at the same frequency as a Pap test. Colorectal cancer can be detected and often prevented. Most routine colorectal cancer screening begins at the age of 37 years and continues through age 87 years. However, your health care provider may recommend screening at an earlier age if you have risk factors for colon cancer. On a yearly basis, your health care provider may provide home test kits to check for hidden blood in the stool. Use of a small camera at the end of a tube, to directly examine the colon (sigmoidoscopy or colonoscopy), can detect the earliest forms of colorectal cancer. Talk to your health care provider about this at age 77, when routine screening begins. Direct exam of the colon should be repeated every  5-10 years through age 42 years, unless early forms of pre-cancerous polyps or small growths are found. People who are at an increased risk for hepatitis B should be screened for this virus. You are considered at high risk for hepatitis B if: You were born in a country where hepatitis B occurs often. Talk with your health care provider about which countries are considered high risk. Your parents were born in a high-risk country and you have not received a shot to protect against hepatitis B (hepatitis B vaccine). You have HIV or AIDS. You use needles to inject street drugs. You live with, or have sex with, someone who has hepatitis B. You get hemodialysis treatment. You take certain medicines for conditions like cancer, organ transplantation, and autoimmune conditions. Hepatitis C blood testing is recommended for all people born from [birth year] through 1965 and any individual with known risks for hepatitis C. Practice safe sex. Use condoms and avoid high-risk sexual practices to reduce the spread of sexually transmitted infections (STIs). STIs include gonorrhea, chlamydia, syphilis, trichomonas, herpes, HPV, and human immunodeficiency virus (HIV). Herpes, HIV, and HPV are viral illnesses that have no cure. They can result in disability, cancer, and death. You should be screened for sexually transmitted illnesses (STIs) including gonorrhea and chlamydia if: You are sexually active and are younger than 24 years. You are older than 24 years and your health care provider tells you that you are at risk for this type of infection. Your sexual activity has changed since you were last screened and you are at an increased risk for chlamydia or gonorrhea. Ask your health care provider if you are at risk. If you are at risk of being infected with HIV, it is recommended that you take a prescription medicine daily to prevent HIV infection. This is called preexposure prophylaxis (PrEP). You are considered at risk  if: You are a heterosexual woman, are sexually active, and are at increased risk for HIV infection. You take drugs by injection. You are sexually active with a partner who has HIV. Talk with your health care provider about whether you are at high risk of being infected with HIV. If you choose to begin PrEP, you should first be tested for HIV. You should then be tested every 3 months for as long as you are taking PrEP. Osteoporosis is a disease in which the bones lose minerals and strength with aging. This can result in serious bone fractures or breaks. The risk of osteoporosis can be identified using a bone density scan. Women ages 58 years and over and women at risk for fractures or osteoporosis should discuss screening with their health care  providers. Ask your health care provider whether you should take a calcium supplement or vitamin D to reduce the rate of osteoporosis. Menopause can be associated with physical symptoms and risks. Hormone replacement therapy is available to decrease symptoms and risks. You should talk to your health care provider about whether hormone replacement therapy is right for you. Use sunscreen. Apply sunscreen liberally and repeatedly throughout the day. You should seek shade when your shadow is shorter than you. Protect yourself by wearing long sleeves, pants, a wide-brimmed hat, and sunglasses year round, whenever you are outdoors. Once a month, do a whole body skin exam, using a mirror to look at the skin on your back. Tell your health care provider of new moles, moles that have irregular borders, moles that are larger than a pencil eraser, or moles that have changed in shape or color. Stay current with required vaccines (immunizations). Influenza vaccine. All adults should be immunized every year. Tetanus, diphtheria, and acellular pertussis (Td, Tdap) vaccine. Pregnant women should receive 1 dose of Tdap vaccine during each pregnancy. The dose should be obtained  regardless of the length of time since the last dose. Immunization is preferred during the 27th-36th week of gestation. An adult who has not previously received Tdap or who does not know her vaccine status should receive 1 dose of Tdap. This initial dose should be followed by tetanus and diphtheria toxoids (Td) booster doses every 10 years. Adults with an unknown or incomplete history of completing a 3-dose immunization series with Td-containing vaccines should begin or complete a primary immunization series including a Tdap dose. Adults should receive a Td booster every 10 years. Varicella vaccine. An adult without evidence of immunity to varicella should receive 2 doses or a second dose if she has previously received 1 dose. Pregnant females who do not have evidence of immunity should receive the first dose after pregnancy. This first dose should be obtained before leaving the health care facility. The second dose should be obtained 4-8 weeks after the first dose. Human papillomavirus (HPV) vaccine. Females aged 13-26 years who have not received the vaccine previously should obtain the 3-dose series. The vaccine is not recommended for use in pregnant females. However, pregnancy testing is not needed before receiving a dose. If a female is found to be pregnant after receiving a dose, no treatment is needed. In that case, the remaining doses should be delayed until after the pregnancy. Immunization is recommended for any person with an immunocompromised condition through the age of 45 years if she did not get any or all doses earlier. During the 3-dose series, the second dose should be obtained 4-8 weeks after the first dose. The third dose should be obtained 24 weeks after the first dose and 16 weeks after the second dose. Zoster vaccine. One dose is recommended for adults aged 99 years or older unless certain conditions are present. Measles, mumps, and rubella (MMR) vaccine. Adults born before [birth year] generally  are considered immune to measles and mumps. Adults born in [birth year] or later should have 1 or more doses of MMR vaccine unless there is a contraindication to the vaccine or there is laboratory evidence of immunity to each of the three diseases. A routine second dose of MMR vaccine should be obtained at least 28 days after the first dose for students attending postsecondary schools, health care workers, or international travelers. People who received inactivated measles vaccine or an unknown type of measles vaccine during 1963-1967 should receive 2 doses of  MMR vaccine. People who received inactivated mumps vaccine or an unknown type of mumps vaccine before 1979 and are at high risk for mumps infection should consider immunization with 2 doses of MMR vaccine. For females of childbearing age, rubella immunity should be determined. If there is no evidence of immunity, females who are not pregnant should be vaccinated. If there is no evidence of immunity, females who are pregnant should delay immunization until after pregnancy. Unvaccinated health care workers born before [birth year] who lack laboratory evidence of measles, mumps, or rubella immunity or laboratory confirmation of disease should consider measles and mumps immunization with 2 doses of MMR vaccine or rubella immunization with 1 dose of MMR vaccine. Pneumococcal 13-valent conjugate (PCV13) vaccine. When indicated, a person who is uncertain of her immunization history and has no record of immunization should receive the PCV13 vaccine. An adult aged 38 years or older who has certain medical conditions and has not been previously immunized should receive 1 dose of PCV13 vaccine. This PCV13 should be followed with a dose of pneumococcal polysaccharide (PPSV23) vaccine. The PPSV23 vaccine dose should be obtained at least 8 weeks after the dose of PCV13 vaccine. An adult aged 1 years or older who has certain medical conditions and previously received 1 or more doses of  PPSV23 vaccine should receive 1 dose of PCV13. The PCV13 vaccine dose should be obtained 1 or more years after the last PPSV23 vaccine dose. Pneumococcal polysaccharide (PPSV23) vaccine. When PCV13 is also indicated, PCV13 should be obtained first. All adults aged 83 years and older should be immunized. An adult younger than age 32 years who has certain medical conditions should be immunized. Any person who resides in a nursing home or long-term care facility should be immunized. An adult smoker should be immunized. People with an immunocompromised condition and certain other conditions should receive both PCV13 and PPSV23 vaccines. People with human immunodeficiency virus (HIV) infection should be immunized as soon as possible after diagnosis. Immunization during chemotherapy or radiation therapy should be avoided. Routine use of PPSV23 vaccine is not recommended for American Indians, [place]atives, or people younger than 65 years unless there are medical conditions that require PPSV23 vaccine. When indicated, people who have unknown immunization and have no record of immunization should receive PPSV23 vaccine. One-time revaccination 5 years after the first dose of PPSV23 is recommended for people aged 19-64 years who have chronic kidney failure, nephrotic syndrome, asplenia, or immunocompromised conditions. People who received 1-2 doses of PPSV23 before age 60 years should receive another dose of PPSV23 vaccine at age 70 years or later if at least 5 years have passed since the previous dose. Doses of PPSV23 are not needed for people immunized with PPSV23 at or after age 44 years. Meningococcal vaccine. Adults with asplenia or persistent complement component deficiencies should receive 2 doses of quadrivalent meningococcal conjugate (MenACWY-D) vaccine. The doses should be obtained at least 2 months apart. Microbiologists working with certain meningococcal bacteria, [place] recruits, people at risk during an  outbreak, and people who travel to or live in countries with a high rate of meningitis should be immunized. A first-year college student up through age 81 years who is living in a residence hall should receive a dose if she did not receive a dose on or after her 16th birthday. Adults who have certain high-risk conditions should receive one or more doses of vaccine. Hepatitis A vaccine. Adults who wish to be protected from this disease, have certain high-risk conditions, work  with hepatitis A-infected animals, work in hepatitis A research labs, or travel to or work in countries with a high rate of hepatitis A should be immunized. Adults who were previously unvaccinated and who anticipate close contact with an international adoptee during the first 60 days after arrival in the [place] from a country with a high rate of hepatitis A should be immunized. Hepatitis B vaccine. Adults who wish to be protected from this disease, have certain high-risk conditions, may be exposed to blood or other infectious body fluids, are household contacts or sex partners of hepatitis B positive people, are clients or workers in certain care facilities, or travel to or work in countries with a high rate of hepatitis B should be immunized. Haemophilus influenzae type b (Hib) vaccine. A previously unvaccinated person with asplenia or sickle cell disease or having a scheduled splenectomy should receive 1 dose of Hib vaccine. Regardless of previous immunization, a recipient of a hematopoietic stem cell transplant should receive a 3-dose series 6-12 months after her successful transplant. Hib vaccine is not recommended for adults with HIV infection. Preventive Services / Frequency Ages 32 to 30 years Blood pressure check.** / Every 1 to 2 years. Lipid and cholesterol check.** / Every 5 years beginning at age 35. Clinical breast exam.** / Every 3 years for women in their 68s and 77s. BRCA-related cancer risk assessment.** / For  women who have family members with a BRCA-related cancer (breast, ovarian, tubal, or peritoneal cancers). Pap test.** / Every 2 years from ages 80 through 6. Every 3 years starting at age 9 through age 40 or 77 with a history of 3 consecutive normal Pap tests. HPV screening.** / Every 3 years from ages 21 through ages 74 to 12 with a history of 3 consecutive normal Pap tests. Hepatitis C blood test.** / For any individual with known risks for hepatitis C. Skin self-exam. / Monthly. Influenza vaccine. / Every year. Tetanus, diphtheria, and acellular pertussis (Tdap, Td) vaccine.** / Consult your health care provider. Pregnant women should receive 1 dose of Tdap vaccine during each pregnancy. 1 dose of Td every 10 years. Varicella vaccine.** / Consult your health care provider. Pregnant females who do not have evidence of immunity should receive the first dose after pregnancy. HPV vaccine. / 3 doses over 6 months, if 61 and younger. The vaccine is not recommended for use in pregnant females. However, pregnancy testing is not needed before receiving a dose. Measles, mumps, rubella (MMR) vaccine.** / You need at least 1 dose of MMR if you were born in 1957 or later. You may also need a 2nd dose. For females of childbearing age, rubella immunity should be determined. If there is no evidence of immunity, females who are not pregnant should be vaccinated. If there is no evidence of immunity, females who are pregnant should delay immunization until after pregnancy. Pneumococcal 13-valent conjugate (PCV13) vaccine.** / Consult your health care provider. Pneumococcal polysaccharide (PPSV23) vaccine.** / 1 to 2 doses if you smoke cigarettes or if you have certain conditions. Meningococcal vaccine.** / 1 dose if you are age 32 to 74 years and a Market researcher living in a residence hall, or have one of several medical conditions, you need to get vaccinated against meningococcal disease. You may also  need additional booster doses. Hepatitis A vaccine.** / Consult your health care provider. Hepatitis B vaccine.** / Consult your health care provider. Haemophilus influenzae type b (Hib) vaccine.** / Consult your health care provider. Ages 61  to 64 years Blood pressure check.** / Every 1 to 2 years. Lipid and cholesterol check.** / Every 5 years beginning at age 45 years. Lung cancer screening. / Every year if you are aged 16-80 years and have a 30-pack-year history of smoking and currently smoke or have quit within the past 15 years. Yearly screening is stopped once you have quit smoking for at least 15 years or develop a health problem that would prevent you from having lung cancer treatment. Clinical breast exam.** / Every year after age 49 years. BRCA-related cancer risk assessment.** / For women who have family members with a BRCA-related cancer (breast, ovarian, tubal, or peritoneal cancers). Mammogram.** / Every year beginning at age 76 years and continuing for as long as you are in good health. Consult with your health care provider. Pap test.** / Every 3 years starting at age 94 years through age 61 or 80 years with a history of 3 consecutive normal Pap tests. HPV screening.** / Every 3 years from ages 6 years through ages 20 to 43 years with a history of 3 consecutive normal Pap tests. Fecal occult blood test (FOBT) of stool. / Every year beginning at age 44 years and continuing until age 91 years. You may not need to do this test if you get a colonoscopy every 10 years. Flexible sigmoidoscopy or colonoscopy.** / Every 5 years for a flexible sigmoidoscopy or every 10 years for a colonoscopy beginning at age 39 years and continuing until age 13 years. Hepatitis C blood test.** / For all people born from [birth year] through 1967 and any individual with known risks for hepatitis C. Skin self-exam. / Monthly. Influenza vaccine. / Every year. Tetanus, diphtheria, and acellular pertussis (Tdap/Td)  vaccine.** / Consult your health care provider. Pregnant women should receive 1 dose of Tdap vaccine during each pregnancy. 1 dose of Td every 10 years. Varicella vaccine.** / Consult your health care provider. Pregnant females who do not have evidence of immunity should receive the first dose after pregnancy. Zoster vaccine.** / 1 dose for adults aged 76 years or older. Measles, mumps, rubella (MMR) vaccine.** / You need at least 1 dose of MMR if you were born in 1957 or later. You may also need a 2nd dose. For females of childbearing age, rubella immunity should be determined. If there is no evidence of immunity, females who are not pregnant should be vaccinated. If there is no evidence of immunity, females who are pregnant should delay immunization until after pregnancy. Pneumococcal 13-valent conjugate (PCV13) vaccine.** / Consult your health care provider. Pneumococcal polysaccharide (PPSV23) vaccine.** / 1 to 2 doses if you smoke cigarettes or if you have certain conditions. Meningococcal vaccine.** / Consult your health care provider. Hepatitis A vaccine.** / Consult your health care provider. Hepatitis B vaccine.** / Consult your health care provider. Haemophilus influenzae type b (Hib) vaccine.** / Consult your health care provider. Ages 52 years and over Blood pressure check.** / Every 1 to 2 years. Lipid and cholesterol check.** / Every 5 years beginning at age 82 years. Lung cancer screening. / Every year if you are aged 38-80 years and have a 30-pack-year history of smoking and currently smoke or have quit within the past 15 years. Yearly screening is stopped once you have quit smoking for at least 15 years or develop a health problem that would prevent you from having lung cancer treatment. Clinical breast exam.** / Every year after age 13 years. BRCA-related cancer risk assessment.** / For women who  have family members with a BRCA-related cancer (breast, ovarian, tubal, or peritoneal  cancers). Mammogram.** / Every year beginning at age 61 years and continuing for as long as you are in good health. Consult with your health care provider. Pap test.** / Every 3 years starting at age 54 years through age 26 or 42 years with 3 consecutive normal Pap tests. Testing can be stopped between 65 and 70 years with 3 consecutive normal Pap tests and no abnormal Pap or HPV tests in the past 10 years. HPV screening.** / Every 3 years from ages 43 years through ages 76 or 78 years with a history of 3 consecutive normal Pap tests. Testing can be stopped between 65 and 70 years with 3 consecutive normal Pap tests and no abnormal Pap or HPV tests in the past 10 years. Fecal occult blood test (FOBT) of stool. / Every year beginning at age 78 years and continuing until age 56 years. You may not need to do this test if you get a colonoscopy every 10 years. Flexible sigmoidoscopy or colonoscopy.** / Every 5 years for a flexible sigmoidoscopy or every 10 years for a colonoscopy beginning at age 36 years and continuing until age 39 years. Hepatitis C blood test.** / For all people born from [birth year] through 1967 and any individual with known risks for hepatitis C. Osteoporosis screening.** / A one-time screening for women ages 57 years and over and women at risk for fractures or osteoporosis. Skin self-exam. / Monthly. Influenza vaccine. / Every year. Tetanus, diphtheria, and acellular pertussis (Tdap/Td) vaccine.** / 1 dose of Td every 10 years. Varicella vaccine.** / Consult your health care provider. Zoster vaccine.** / 1 dose for adults aged 27 years or older. Pneumococcal 13-valent conjugate (PCV13) vaccine.** / Consult your health care provider. Pneumococcal polysaccharide (PPSV23) vaccine.** / 1 dose for all adults aged 81 years and older. Meningococcal vaccine.** / Consult your health care provider. Hepatitis A vaccine.** / Consult your health care provider. Hepatitis B vaccine.** / Consult your  health care provider. Haemophilus influenzae type b (Hib) vaccine.** / Consult your health care provider. ** Family history and personal history of risk and conditions may change your health care provider's recommendations. Document Released: 07/16/2001 Document Revised: 10/04/2013 Document Reviewed: 10/15/2010 [facility] Patient Information 2015 [place], [place]. This information is not intended to replace advice given to you by your health care provider. Make sure you discuss any questions you have with your health care provider.   Bring your child to the dentist every 6 months for professional cleanings and exams.  If your child is involved in contact sports, make sure he or she wears a properly fitted mouth guard.  Ask your dentist about dental sealants. This is a coating painted onto the molars to prevent decay.   Encourage a healthy diet. Help your child limit sugary drinks and foods.  Make sure your child has an early orthodontic evaluation. Your child should have an evaluation by about age 37. Many orthodontic problems can be treated early, preventing the need for full fixed braces in the future.  Talk to your adolescent about the risks of oral piercings and smoking. Help your child avoid these risks.  Contact your dental caregiver if your child has pain or other problems in the mouth. ADULTS  Brush at least 2 times per day and after meals if possible. Brush for at least 2 minutes.  Use a fluoride toothpaste or drink fluoridated water.  Floss daily.  Keep retainers, dentures, or other  mouth devices clean and sanitized. Soak or brush them as directed.  Eat a healthy diet. Limit sugary drinks and foods.  See your dentist every 6 months.  Follow up with your dentist as directed.  Always see your dentist at the first sign of tooth or gum pain.  Avoid smoking.  Limit alcohol. ELDERLY OR THOSE WITH A CHRONIC HEALTH CONDITION Follow the adult guidelines above, and in  addition:  Manage chronic conditions, such as diabetes. Certain conditions can increase your risk of gum disease.  If you experience dry mouth from medicines, ask your caregiver about treatment options. Try drinking more water and chewing sugarless gum. Avoid alcohol.  Visit your dentist prior to cancer treatment to take care of any problems. SEEK IMMEDIATE DENTAL CARE IF:  You develop pain, bleeding, or soreness in the gum, tooth, jaw, or mouth area.  A permanent tooth becomes loose or separated from the gum socket.  You experience a blow or injury to the mouth or jaw area. Document Released: 09/14/2010 Document Revised: 08/12/2011 Document Reviewed: 09/14/2010 [facility] Patient Information 2015 [place], [place]. This information is not intended to replace advice given to you by your health care provider. Make sure you discuss any questions you have with your health care provider.  Follow up as regularly scheduled with pcp. We will contact you on mammogram and dexascan. You have copies of zostavax and tdap order. Do to time constraints mini-mental declined. Can do testing later. You can come in for fasting lipid panel in 1-2 weeks. Call and be put on lab schedule 2 days in advance.

## 2014-04-01 NOTE — Progress Notes (Signed)
Subjective:    Patient ID: Becky Boyd, female    DOB: 17-Dec-1955, 58 y.o.   MRN: [id number]  HPI  Pt in for awv and physical.  Pt had not had mammogram done possible before 2005. She can't remember when that was done.   Pt last papsmear was yr before last.Hysterectomy year before last. Saw gyn for that.  Pt states last colonsocpy in 2010. Due in in 2020.  In 2013 had last dexascan. She may have outstanding order.  Pt never smoked.  Pt last lipid panel was January 2015. Her levels were high. Pt is on lipitor. Sometimes eats fried foods. Does not eat out a lot. Pt does not exercise much. Pt is on atorvastatin 20 mg q day. No diffuse myalgias reported.  Pt does take flu-vaccine. Given today.  Pt  late on tetanus vaccine(printed and given to pt)  Pt late vaccine on zostavax.(printed and she will check with rheumatolgist regarding this vaccine and methotrexate)  Ekg done in 2015.           Review of Systems    see medicare smartset Objective:   Physical Exam See medicare smart set       Assessment & Plan:   Subjective:    Becky Boyd is a 58 y.o. female who presents for Medicare Annual/Subsequent preventive examination.  Preventive Screening-Counseling & Management  Tobacco History  Smoking status  . Never Smoker   Smokeless tobacco  . Never Used     Problems Prior to Visit 1. No problems other than hyperlipidemia.  Current Problems (verified) Patient Active Problem List   Diagnosis Date Noted  . Pain in joint, shoulder region 10/21/2013  . Chest pain 08/31/2013  . Post-nasal drip 08/31/2013  . Abnormal brain MRI 06/17/2013  . Sjogren's syndrome 06/17/2013  . Blurry vision 06/17/2013  . Back pain, lumbosacral 09/30/2012  . Trigger point with back pain 03/22/2011  . General medical examination 01/25/2011  . Memory loss 01/25/2011  . Joint pain 11/08/2010  . Fatigue 11/08/2010  . HYPOTHYROIDISM 05/02/2008  . HASHIMOTO'S THYROIDITIS  05/02/2008  . ANOREXIA 05/02/2008  . DYSPHAGIA UNSPECIFIED 05/02/2008  . ABDOMINAL PAIN 05/02/2008    Medications Prior to Visit Current Outpatient Prescriptions on File Prior to Visit  Medication Sig Dispense Refill  . atorvastatin (LIPITOR) 20 MG tablet TAKE 1 TABLET BY MOUTH DAILY.  30 tablet  6  . baclofen (LIORESAL) 10 MG tablet Take 10 mg by mouth 3 (three) times daily.        Marland Kitchen HYDROcodone-acetaminophen (NORCO/VICODIN) 5-325 MG per tablet Take 1-2 tablets by mouth every 6 (six) hours as needed.  20 tablet  0  . levothyroxine (SYNTHROID, LEVOTHROID) 75 MCG tablet Take 75 mcg by mouth daily.        . meloxicam (MOBIC) 15 MG tablet Take 15 mg by mouth daily as needed for pain.      . methotrexate 2.5 MG tablet Take 2.5 mg by mouth once a week. Take 8 tablets once per week (at the same time).      . predniSONE (DELTASONE) 20 MG tablet Take 3 tablets (60 mg total) by mouth daily with breakfast.  9 tablet  0  . predniSONE (DELTASONE) 5 MG tablet Take 5 mg by mouth daily.      . diphenhydrAMINE (BENADRYL) 25 MG tablet Take 1 tablet (25 mg total) by mouth every 6 (six) hours.  20 tablet  0  . famotidine (PEPCID) 20 MG tablet Take 1 tablet (20 mg  total) by mouth 2 (two) times daily.  6 tablet  0  . HYDROcodone-acetaminophen (NORCO/VICODIN) 5-325 MG per tablet Take 1 tablet by mouth every 6 (six) hours as needed for moderate pain.       No current facility-administered medications on file prior to visit.    Current Medications (verified) Current Outpatient Prescriptions  Medication Sig Dispense Refill  . atorvastatin (LIPITOR) 20 MG tablet TAKE 1 TABLET BY MOUTH DAILY.  30 tablet  6  . baclofen (LIORESAL) 10 MG tablet Take 10 mg by mouth 3 (three) times daily.        Marland Kitchen HYDROcodone-acetaminophen (NORCO/VICODIN) 5-325 MG per tablet Take 1-2 tablets by mouth every 6 (six) hours as needed.  20 tablet  0  . levothyroxine (SYNTHROID, LEVOTHROID) 75 MCG tablet Take 75 mcg by mouth daily.          . meloxicam (MOBIC) 15 MG tablet Take 15 mg by mouth daily as needed for pain.      . methotrexate 2.5 MG tablet Take 2.5 mg by mouth once a week. Take 8 tablets once per week (at the same time).      . predniSONE (DELTASONE) 20 MG tablet Take 3 tablets (60 mg total) by mouth daily with breakfast.  9 tablet  0  . predniSONE (DELTASONE) 5 MG tablet Take 5 mg by mouth daily.      . diphenhydrAMINE (BENADRYL) 25 MG tablet Take 1 tablet (25 mg total) by mouth every 6 (six) hours.  20 tablet  0  . famotidine (PEPCID) 20 MG tablet Take 1 tablet (20 mg total) by mouth 2 (two) times daily.  6 tablet  0  . HYDROcodone-acetaminophen (NORCO/VICODIN) 5-325 MG per tablet Take 1 tablet by mouth every 6 (six) hours as needed for moderate pain.       No current facility-administered medications for this visit.     Allergies (verified) Plaquenil   PAST HISTORY  Family History Family History  Problem Relation Age of Onset  . Diabetes Sister   . Breast cancer Paternal Aunt     Social History History  Substance Use Topics  . Smoking status: Never Smoker   . Smokeless tobacco: Never Used  . Alcohol Use: Not on file     Are there smokers in your home (other than you)? No  Risk Factors Current exercise habits: Pt does not exercise.  Dietary issues discussed: none  Cardiac risk factors: hyperlipidemia, non  smoker, no htn, no stroke, no MI. no diabetic. Marland Kitchen  Depression Screen (Note: if answer to either of the following is "Yes", a more complete depression screening is indicated)   Over the past two weeks, have you felt down, depressed or hopeless? No  Over the past two weeks, have you felt little interest or pleasure in doing things? No  Have you lost interest or pleasure in daily life? No  Do you often feel hopeless? No  Do you cry easily over simple problems? No  Activities of Daily Living In your present state of health, do you have any difficulty performing the following activities?:   Driving? No Managing money?  No Feeding yourself? No Getting from bed to chair?No problems. Climbing a flight of stairs? No Preparing food and eating?: No Bathing or showering? No Getting dressed: No Getting to the toilet? No Using the toilet:No Moving around from place to place: No In the past year have you fallen or had a near fall?:Yes   Are you sexually active?  Rare.  Do you have more than one partner?  No  Hearing Difficulties: No Do you often ask people to speak up or repeat themselves? No Do you experience ringing or noises in your ears? Occasional rare ringing. Do you have difficulty understanding soft or whispered voices? No   Do you feel that you have a problem with memory? Yes. But on more in depth discussion not seem to have dementia. Due to time constraints did not due mini-mental. Pt will monitor herself and may do testing in near future. Note how she did on cognitive testing  Do you often misplace items? Yes. Sometimes daily.  Do you feel safe at home?  Yes  Cognitive Testing  Alert? Yes  Normal Appearance?Yes  Oriented to person? Yes  Place? Yes   Time? Yes  Recall of three objects?  Yes  Can perform simple calculations? Yes  Displays appropriate judgment?Yes  Can read the correct time from a watch face?Yes   Advanced Directives have been discussed with the patient? Yes  List the Names of Other Physician/Practitioners you currently use: 1.  Dr. Talmage Nap enocrinologist. Dr. Dierdre Forth rheumatolgist. Dr. Clarisse Gouge- Neurologist.  Indicate any recent Medical Services you may have received from other than [facility] providers in the past year (date may be approximate).  Immunization History  Administered Date(s) Administered  . Influenza,inj,Quad PF,36+ Mos 04/22/2013    Screening Tests Health Maintenance  Topic Date Due  . Tetanus/tdap  12/23/1972  . Mammogram  12/24/2003  . Zostavax  12/23/2013  . Influenza Vaccine  01/01/2014  . Pap Smear  06/18/2015  .  Colonoscopy  06/17/2018    All answers were reviewed with the patient and necessary referrals were made:  Esperanza Richters, PA-C   04/01/2014   History reviewed:  She  has a past medical history of Migraines; Anorexia; Dysphagia; Hashimoto thyroiditis; Hypothyroidism; Lupus; Fibromyalgia; Sjogren's disease; and Aneurysm. She  does not have any pertinent problems on file. She  has past surgical history that includes Cholecystectomy; Abdominal hysterectomy; kidney stone; cervical rods; l5-s1 surgery; Cervical fusion (2004); Cervical fusion (2004); and Lumbar microdiscectomy (2004). Her family history includes Breast cancer in her paternal aunt; Diabetes in her sister. She  reports that she has never smoked. She has never used smokeless tobacco. Her alcohol and drug histories are not on file. She has a current medication list which includes the following prescription(s): atorvastatin, baclofen, hydrocodone-acetaminophen, levothyroxine, meloxicam, methotrexate, prednisone, prednisone, diphenhydramine, famotidine, and hydrocodone-acetaminophen. Current Outpatient Prescriptions on File Prior to Visit  Medication Sig Dispense Refill  . atorvastatin (LIPITOR) 20 MG tablet TAKE 1 TABLET BY MOUTH DAILY.  30 tablet  6  . baclofen (LIORESAL) 10 MG tablet Take 10 mg by mouth 3 (three) times daily.        Marland Kitchen HYDROcodone-acetaminophen (NORCO/VICODIN) 5-325 MG per tablet Take 1-2 tablets by mouth every 6 (six) hours as needed.  20 tablet  0  . levothyroxine (SYNTHROID, LEVOTHROID) 75 MCG tablet Take 75 mcg by mouth daily.        . meloxicam (MOBIC) 15 MG tablet Take 15 mg by mouth daily as needed for pain.      . methotrexate 2.5 MG tablet Take 2.5 mg by mouth once a week. Take 8 tablets once per week (at the same time).      . predniSONE (DELTASONE) 20 MG tablet Take 3 tablets (60 mg total) by mouth daily with breakfast.  9 tablet  0  . predniSONE (DELTASONE) 5 MG tablet Take 5 mg by mouth  daily.      .  diphenhydrAMINE (BENADRYL) 25 MG tablet Take 1 tablet (25 mg total) by mouth every 6 (six) hours.  20 tablet  0  . famotidine (PEPCID) 20 MG tablet Take 1 tablet (20 mg total) by mouth 2 (two) times daily.  6 tablet  0  . HYDROcodone-acetaminophen (NORCO/VICODIN) 5-325 MG per tablet Take 1 tablet by mouth every 6 (six) hours as needed for moderate pain.       No current facility-administered medications on file prior to visit.   She is allergic to plaquenil.  Review of Systems A comprehensive review of systems was negative.    Objective:    Vision by Snellen chart: right eye:20/30, left eye:20/30  Body mass index is 25.71 kg/(m^2). BP 118/80  Pulse 88  Temp(Src) 98.4 F (36.9 C) (Oral)  Ht 5\' 2"  (1.575 m)  Wt 140 lb 9.6 oz (63.776 kg)  BMI 25.71 kg/m2  SpO2 99%  General   Mental Status- Alert.  Orientation-Oriented x3. Build and Nutrition Well Nourished and Well Developed.  Skin General: Normal.  Color- Normal color. Moisture- Dry.Temperature warm. Lesions: No suspicious lesions  Head, Eyes, Ears, Nose, Thoat Ears-Normal. Auditory Canal-Bilateral-Normal. Tympanic Membrane- Bilateral-Normal. Eyes Fundi- Bilateral-Normal. Pupil- Bilateral- Direct reaction to light normal. Nose & Sinuses- Normal. Nostril- Bilateral-Normal.  Neck Neck- No Bruits or Masses. Thyroid- Normal. No thyromegaly or nodules.   Chest and Lung Exam  Percussion: Quality and Intensity:-Percussion normal. Percussion of chest reveals- No Dullness. Palpation of the chest reveals- Non-tender. Auscultation: Breath sounds-Normal. Adventitious  Sounds:No adventitious    Cardiovascular Inspection: No Heaves. Auscultation: Heart Sounds- Normal sinus rhythm without murmur or gallop, S1 WNL and S2 WNL.  Abdomen Inspection:- Inspection Normal. Inspection of abdomen reveals- No Hernias. Palpation/Percussion: Palpation and Percussion of the abdomen reveal- Non Tender and No Palpable masses. Liver:  Other Characteristics- No Hepatmegaly Spleen:Other Characteristics- No Splenomegaly. Auscultation: Auscultation of the abdomen reveals-Bowel sounds normal and No Abdominal bruits.     Neurologic Mental Status- Normal Cranial Nerves- Normal Bilaterally, Motor- Normal. Strength:5/5 normal muscle strength- All Muscles. General Assessment of Reflexes- Right Knee- 2+. Left Knee- 2+. Coordination- Normal. Gait- Normal. Meningeal Signs- None.  Musculoskeletal Global Assessment General- Joints show full range of motion without obvious deformity and Normal muscle mass. Strength 5/5 in upper and lower extremities.  Lymphatic General lymphatics Description-No Generalized lymphadenopathy.       Assessment:     This is a routine wellness  examination for this patient . I reviewed all health maintenance protocols including mammography, colonoscopy, bone density Needed referrals were placed. Age and diagnosis  appropriate screening labs were ordered. Her immunization history was reviewed and appropriate vaccinations were ordered. Her current medications and allergies were reviewed and needed refills of her chronic medications were ordered. The plan for yearly health maintenance was discussed all orders and referrals were made as appropriate.    Plan:    During the course of the visit the patient was educated and counseled about appropriate screening and preventive services including:    Influenza vaccine  Screening mammography  Bone densitometry screening Zostavax, and tdap order printed and given to pt. Future lipid panel to be done. Diet review for nutrition referral? Yes __x__  Not Indicated ____   Patient Instructions (the written plan) was given to the patient.  Medicare Attestation I have personally reviewed: The patient's medical and social history Their use of alcohol, tobacco or illicit drugs Their current medications and supplements The patient's functional  ability  including ADLs,fall risks, home safety risks, cognitive, and hearing and visual impairment Diet and physical activities Evidence for depression or mood disorders  The patient's weight, height, BMI, and visual acuity have been recorded in the chart.  I have made referrals, counseling, and provided education to the patient based on review of the above and I have provided the patient with a written personalized care plan for preventive services.    Note regarding billing purposes pt tells me she has had medicare for years so i will charge [phone].   Esperanza Richters, PA-C   04/01/2014

## 2014-04-01 NOTE — Progress Notes (Signed)
Pre visit review using our clinic review tool, if applicable. No additional management support is needed unless otherwise documented below in the visit note. 

## 2014-04-11 ENCOUNTER — Telehealth: Payer: Self-pay

## 2014-04-11 NOTE — Telephone Encounter (Signed)
Left a message for call back.  

## 2014-04-11 NOTE — Telephone Encounter (Signed)
Office notes from Dr. Dierdre Forth states patient has been having episodes of dyspnea and jaw pain.  He asked that patient call her PCP ASAP for he was concerned that it could be patient's heart.  Called patient to triage her.  Pt states that she has occasional jaw pain and shortness of breath.  Accompanying symptoms includes chest tightness and neck pain that radiates to jaw and sometimes to her ear.  She described the sensation as "lock jaw similar to what happens when you take a shot of tequila" or "cramp with electricity."  Symptoms last a few minutes and are relieved with baby ASA .  Last episode was last night.  She denied symptoms at the time of call.    Advice: Appointment tomorrow (04/12/14) with Dr. Beverely Low at 11 am.  She was also advised to go to ER if symptoms worsen or fail to improve.  Pt stated understanding and agreed.

## 2014-04-11 NOTE — Telephone Encounter (Signed)
Agree w/ advice given- particularly if she has a repeat episode she needs to go to ER

## 2014-04-12 ENCOUNTER — Ambulatory Visit: Payer: Medicare Other | Admitting: Family Medicine

## 2014-04-13 ENCOUNTER — Ambulatory Visit (INDEPENDENT_AMBULATORY_CARE_PROVIDER_SITE_OTHER): Payer: Medicare Other | Admitting: Family Medicine

## 2014-04-13 ENCOUNTER — Encounter: Payer: Self-pay | Admitting: Family Medicine

## 2014-04-13 VITALS — BP 122/80 | HR 81 | Temp 98.0°F | Resp 16 | Wt 142.4 lb

## 2014-04-13 DIAGNOSIS — R0789 Other chest pain: Secondary | ICD-10-CM

## 2014-04-13 DIAGNOSIS — R6884 Jaw pain: Secondary | ICD-10-CM

## 2014-04-13 DIAGNOSIS — R0602 Shortness of breath: Secondary | ICD-10-CM

## 2014-04-13 DIAGNOSIS — E039 Hypothyroidism, unspecified: Secondary | ICD-10-CM

## 2014-04-13 LAB — BASIC METABOLIC PANEL
BUN: 16 mg/dL (ref 6–23)
CHLORIDE: 107 meq/L (ref 96–112)
CO2: 24 mEq/L (ref 19–32)
Calcium: 9.6 mg/dL (ref 8.4–10.5)
Creatinine, Ser: 0.9 mg/dL (ref 0.4–1.2)
GFR: 65.29 mL/min (ref >60.00)
GLUCOSE: 99 mg/dL (ref 70–99)
POTASSIUM: 3.7 meq/L (ref 3.5–5.1)
Sodium: 142 mEq/L (ref 135–145)

## 2014-04-13 LAB — CBC WITH DIFFERENTIAL/PLATELET
Basophils Absolute: 0 10*3/uL (ref 0.0–0.1)
Basophils Relative: 0.5 % (ref 0.0–3.0)
Eosinophils Absolute: 0.2 10*3/uL (ref 0.0–0.7)
Eosinophils Relative: 3.6 % (ref 0.0–5.0)
HCT: 37.5 % (ref 36.0–46.0)
HEMOGLOBIN: 12.4 g/dL (ref 12.0–15.0)
LYMPHS ABS: 1.8 10*3/uL (ref 0.7–4.0)
Lymphocytes Relative: 27.8 % (ref 12.0–46.0)
MCHC: 33.2 g/dL (ref 30.0–36.0)
MCV: 97.7 fl (ref 78.0–100.0)
MONOS PCT: 5.7 % (ref 3.0–12.0)
Monocytes Absolute: 0.4 10*3/uL (ref 0.1–1.0)
NEUTROS ABS: 4 10*3/uL (ref 1.4–7.7)
Neutrophils Relative %: 62.4 % (ref 43.0–77.0)
Platelets: 206 10*3/uL (ref 150.0–400.0)
RBC: 3.83 Mil/uL — ABNORMAL LOW (ref 3.87–5.11)
RDW: 14.5 % (ref 11.5–15.5)
WBC: 6.4 10*3/uL (ref 4.0–10.5)

## 2014-04-13 LAB — TSH: TSH: 0.45 u[IU]/mL (ref 0.35–4.50)

## 2014-04-13 LAB — LIPID PANEL
CHOL/HDL RATIO: 3
Cholesterol: 167 mg/dL (ref 0–200)
HDL: 57.4 mg/dL (ref >39.00)
LDL CALC: 82 mg/dL (ref 0–99)
NONHDL: 109.6
TRIGLYCERIDES: 137 mg/dL (ref 0.0–149.0)
VLDL: 27.4 mg/dL (ref 0.0–40.0)

## 2014-04-13 LAB — HEPATIC FUNCTION PANEL
ALBUMIN: 3.5 g/dL (ref 3.5–5.2)
ALT: 29 U/L (ref 0–35)
AST: 22 U/L (ref 0–37)
Alkaline Phosphatase: 86 U/L (ref 39–117)
BILIRUBIN DIRECT: 0 mg/dL (ref 0.0–0.3)
Total Bilirubin: 0.4 mg/dL (ref 0.2–1.2)
Total Protein: 7.2 g/dL (ref 6.0–8.3)

## 2014-04-13 NOTE — Progress Notes (Signed)
Pre visit review using our clinic review tool, if applicable. No additional management support is needed unless otherwise documented below in the visit note. 

## 2014-04-13 NOTE — Assessment & Plan Note (Signed)
Recurrent problem for pt, described as a chest heaviness w/ associated jaw pain.  EKG is unchanged from previous but stressed that this is just a snapshot in time and that she really needs to see cardiology for complete evaluation including stress test.  Despite medical advice, pt is refusing cardiology appt.  Will get Troponin since last episode was 2 nights ago.  Encouraged daily ASA.  Check labs to risk stratify.  Will follow.

## 2014-04-13 NOTE — Patient Instructions (Signed)
If you continue to have symptoms, PLEASE reconsider seeing the cardiologist We'll notify you of your lab results and make any changes if needed Call with any questions or concerns- particularly if symptoms change/worsen Happy Holidays!!!

## 2014-04-13 NOTE — Assessment & Plan Note (Signed)
New.  Asymptomatic today.  Concern that this is possibly cardiac related.  Pt refusing cardiac w/u.  May be related to pt's fibromyalgia and rheumatologic issues but it is impossible to be sure.  Resolved w/ ASA.  Will follow.

## 2014-04-13 NOTE — Progress Notes (Signed)
   Subjective:    Patient ID: Becky Boyd, female    DOB: 1955-09-07, 58 y.o.   MRN: [id number]  HPI Jaw pain w/ SOB- 'awhile'.  Pt had similar sxs in March and EKG is unchanged.  Pt was referred to Cards at that time but she didn't go, 'b/c i'm fine.  No CP.  Pt will have pain radiating up her neck, 'it can be both sides', and then has sensation of 'jaw locking up'.  Will have associated chest heaviness, 'like you can't get a good deep breath'.  Episodes are intermittent- can happen a few days in a week and then not again for months.  No dizziness.  Episodes can last minutes to hrs.  No N/V/D.  'it's just uncomfortable'.  Last episode improved w/ baby ASA.  Pt isn't interested in seeing cardiology- 'they'll want to run a lot of tests, charge me out the ass, to tell me nothing's wrong.  One of [place] will get richer for it and it's not me'.  Last episode 2 days ago.   Review of Systems For ROS see HPI     Objective:   Physical Exam  Constitutional: She is oriented to person, place, and time. She appears well-developed and well-nourished. No distress.  HENT:  Head: Normocephalic and atraumatic.  Eyes: Conjunctivae and EOM are normal. Pupils are equal, round, and reactive to light.  Neck: Normal range of motion. Neck supple. No thyromegaly present.  No carotid bruits bilaterally  Cardiovascular: Normal rate, regular rhythm, normal heart sounds and intact distal pulses.   No murmur heard. Pulmonary/Chest: Effort normal and breath sounds normal. No respiratory distress.  Abdominal: Soft. She exhibits no distension. There is no tenderness.  Musculoskeletal: She exhibits no edema.  Lymphadenopathy:    She has no cervical adenopathy.  Neurological: She is alert and oriented to person, place, and time.  Skin: Skin is warm and dry.  Psychiatric: She has a normal mood and affect. Her behavior is normal.          Assessment & Plan:

## 2014-04-14 LAB — TROPONIN I: Troponin I: 0.01 ng/mL (ref <0.06)

## 2014-04-19 ENCOUNTER — Ambulatory Visit (HOSPITAL_COMMUNITY): Payer: Medicare Other

## 2014-04-19 ENCOUNTER — Other Ambulatory Visit (HOSPITAL_COMMUNITY): Payer: Medicare Other

## 2014-07-08 ENCOUNTER — Telehealth: Payer: Self-pay | Admitting: Family Medicine

## 2014-07-08 DIAGNOSIS — R6884 Jaw pain: Secondary | ICD-10-CM

## 2014-07-08 DIAGNOSIS — R079 Chest pain, unspecified: Secondary | ICD-10-CM

## 2014-07-08 NOTE — Telephone Encounter (Signed)
Caller name: Morrie Sheldon from [facility], Dr. Dierdre Forth Relation to pt: Call back number: [phone] Pharmacy:  Reason for call:   Patient was in office yesterday and told staff that she had been having chest pain with jaw pain for the past three months. Morrie Sheldon at [facility] thinks that patient would be a good candidate for medical stress testing and hoping that we can arrange referral to cardiology

## 2014-07-08 NOTE — Telephone Encounter (Signed)
Spoke with patient. She is agreeable to Cardiology referral.  Referral placed.

## 2014-07-08 NOTE — Telephone Encounter (Signed)
Called to follow up with patient.  Left a message for call back.   Pt last saw Dr. Beverely Low on 04/13/14 for similar symptoms.  During this appointment, it was strongly recommended that patient be seen by Cardiologist.  Pt refused despite medical advice.  Called Morrie Sheldon at [facility] to make her aware of the same.  She stated understanding and agreed with plan of following up with patient.

## 2014-08-15 ENCOUNTER — Ambulatory Visit (INDEPENDENT_AMBULATORY_CARE_PROVIDER_SITE_OTHER): Payer: Medicare Other | Admitting: Internal Medicine

## 2014-08-15 ENCOUNTER — Encounter: Payer: Self-pay | Admitting: Internal Medicine

## 2014-08-15 VITALS — BP 132/90 | HR 97 | Ht 62.0 in | Wt 147.4 lb

## 2014-08-15 DIAGNOSIS — R0789 Other chest pain: Secondary | ICD-10-CM

## 2014-08-15 NOTE — Progress Notes (Signed)
Cardiology Office Note   Date:  08/15/2014   ID:  Becky Boyd, DOB 06-25-1955, MRN [id number]  PCP:  Neena Rhymes, MD  Cardiologist:   Dietrich Pates, MD   Patient referred for evaluation of CP      History of Present Illness: Becky Boyd is a 59 y.o. female with a history of CP    She is followed by Dr Beverely Low  Has refused referrals in the past.    Patient is also seen by Dr Dierdre Forth in rheum  Hx of lupus    Sometimes feels like there is something sitting on chest  Can't get breath  Will get discomfort that  goes up neck to throat  Tight  Not associated with activity  Last spell was Saturday afternoon  Can occur every couple weeks or 2x per week  Can last min to hours.   Sometimes aspirin has helped neck pain   Has hot flashes. Patinet is not too active  Doesn't walk as much as she used to Now very little because of low back problems SOB with walking and talking  Tries to take big breath and cant  No wheezing    Can't lay flat.  Sleeps with 2 pillows  Chronic.   Trouble swallowing .   Dizzy Mld spells   No syncope     Has been on prednisone for 2 yr   Now on 5 per day      Current Outpatient Prescriptions  Medication Sig Dispense Refill  . Adalimumab (HUMIRA PEN Wolverine) Inject into the skin.    Marland Kitchen atorvastatin (LIPITOR) 20 MG tablet TAKE 1 TABLET BY MOUTH DAILY. 30 tablet 6  . baclofen (LIORESAL) 10 MG tablet Take 10 mg by mouth 3 (three) times daily.      Marland Kitchen HYDROcodone-acetaminophen (NORCO/VICODIN) 5-325 MG per tablet Take 1-2 tablets by mouth every 6 (six) hours as needed. 20 tablet 0  . levothyroxine (SYNTHROID, LEVOTHROID) 75 MCG tablet Take 75 mcg by mouth daily.      . meloxicam (MOBIC) 15 MG tablet Take 15 mg by mouth daily as needed for pain.    . methotrexate 2.5 MG tablet Take 2.5 mg by mouth once a week. Take 8 tablets once per week (at the same time).    . predniSONE (DELTASONE) 20 MG tablet Take 3 tablets (60 mg total) by mouth daily with breakfast. 9 tablet 0  .  Tdap (BOOSTRIX) 5-2.5-18.5 LF-MCG/0.5 injection Inject 0.5 mLs into the muscle once. 0.5 mL 0   No current facility-administered medications for this visit.    Allergies:   Plaquenil   Past Medical History  Diagnosis Date  . Migraines   . Anorexia   . Dysphagia   . Hashimoto thyroiditis   . Hypothyroidism   . Lupus   . Fibromyalgia   . Sjogren's disease   . Aneurysm     Past Surgical History  Procedure Laterality Date  . Cholecystectomy    . Abdominal hysterectomy    . Kidney stone    . Cervical rods    . L5-s1 surgery    . Cervical fusion  2004    C4-C5 with Instrumentation  . Cervical fusion  2004    C4-C5  . Lumbar microdiscectomy  2004    Bilateral L5-S1     Social History:  The patient  reports that she has never smoked. She has never used smokeless tobacco.   Family History:  The patient's family history includes Breast cancer in her  paternal aunt; Diabetes in her sister.    ROS:  Please see the history of present illness. All other systems are reviewed and  Negative to the above problem except as noted.    PHYSICAL EXAM: VS:  BP 132/90 mmHg  Pulse 97  Ht 5\' 2"  (1.575 m)  Wt 147 lb 6.4 oz (66.86 kg)  BMI 26.95 kg/m2  GEN: Well nourished, well developed, in no acute distress HEENT: normal Neck: no JVD, carotid bruits, or masses Cardiac: RRR; no murmurs, rubs, or gallops,no edema  Respiratory:  clear to auscultation bilaterally, normal work of breathing GI: soft, nontender, nondistended, + BS  No hepatomegaly  MS: no deformity Moving all extremities   Skin: warm and dry, no rash Neuro:  Strength and sensation are intact Psych: euthymic mood, full affect   EKG:  EKG is ordered today.  SR 97 bpm     Lipid Panel    Component Value Date/Time   CHOL 167 04/13/2014 1214   TRIG 137.0 04/13/2014 1214   HDL 57.40 04/13/2014 1214   CHOLHDL 3 04/13/2014 1214   VLDL 27.4 04/13/2014 1214   LDLCALC 82 04/13/2014 1214   LDLDIRECT 123.9 06/17/2013 1529       Wt Readings from Last 3 Encounters:  08/15/14 147 lb 6.4 oz (66.86 kg)  04/13/14 142 lb 6 oz (64.581 kg)  04/01/14 140 lb 9.6 oz (63.776 kg)      ASSESSMENT AND PLAN:  1  SOB  Concerning  I would set up for echo to evaluate LVEF  If normal would consider lexiscan myoview  If down would need to consider cath    2  HTN  BP mildly increased  Will need to be followed      Current medicines are reviewed at length with the patient today.  The patient does not have concerns regarding medicines.  The following changes have been made: No change in meds  Labs/ tests ordered today include:  Echo   No orders of the defined types were placed in this encounter.     Disposition:   FU is based on test results     Signed, 04/03/14, MD  08/15/2014 12:37 PM    [facility] [address] [place], [place], [place]  [place] Phone: [phone]; Fax: [phone]

## 2014-08-15 NOTE — Patient Instructions (Signed)

## 2014-08-18 ENCOUNTER — Other Ambulatory Visit (HOSPITAL_COMMUNITY): Payer: Medicare Other

## 2014-08-30 ENCOUNTER — Ambulatory Visit (HOSPITAL_COMMUNITY): Payer: Medicare Other | Attending: Cardiology | Admitting: Radiology

## 2014-08-30 DIAGNOSIS — R0789 Other chest pain: Secondary | ICD-10-CM

## 2014-08-30 NOTE — Progress Notes (Signed)
Echocardiogram performed.  

## 2014-09-05 ENCOUNTER — Telehealth: Payer: Self-pay | Admitting: Internal Medicine

## 2014-09-05 DIAGNOSIS — R0602 Shortness of breath: Secondary | ICD-10-CM

## 2014-09-05 NOTE — Telephone Encounter (Signed)
New Message ° ° ° ° ° ° °Pt calling to get echo results. Please call back and advise. °

## 2014-09-05 NOTE — Telephone Encounter (Signed)
Notes Recorded by Lendon Ka, RN on 09/05/2014 at 6:08 PM Patient made aware of echo result and that someone will call to schedule myoview. She is unable to walk on treadmill. I reviewed instructions with patient and will mail her a copy. Notes Recorded by Pricilla Riffle, MD on 09/03/2014 at 6:26 PM Echo shows normal LV and RV pumping function No signif valvular abnormalities Woould set up for myoview. Stress myoview if she thinks she can walk, otherwise lexiscan myoview Dx SOB  ORDER PLACED.  MESSAGE SENT TO PCC TO SCHEDULE.

## 2014-09-16 ENCOUNTER — Telehealth: Payer: Self-pay | Admitting: *Deleted

## 2014-09-16 NOTE — Telephone Encounter (Signed)
Stress test scheduled for 4/27--9:15 am Called patient to inform, left message for her to call back.

## 2014-09-20 NOTE — Telephone Encounter (Signed)
Spoke with patient. Informed of details of stress test. She thought she should cancel because according to her echo--there is nothing wrong with her heart. Reviewed normal echo results again with her and discussed that she is still SOB and so that is the reason for the nuclear test.  Pt verbalizes understanding.  Has not received mailed instructions yet, but reviewed again over the phone with her today.  She is appreciative for the call back and would like a copy of the echo mailed to her, which I will do.

## 2014-09-20 NOTE — Telephone Encounter (Signed)
New Message   Patient is returning phone call to nurse, please give a call back.  Thanks

## 2014-09-28 ENCOUNTER — Ambulatory Visit (HOSPITAL_COMMUNITY): Payer: Medicare Other | Attending: Internal Medicine | Admitting: Radiology

## 2014-09-28 DIAGNOSIS — R0609 Other forms of dyspnea: Secondary | ICD-10-CM | POA: Diagnosis not present

## 2014-09-28 DIAGNOSIS — R0602 Shortness of breath: Secondary | ICD-10-CM

## 2014-09-28 DIAGNOSIS — R079 Chest pain, unspecified: Secondary | ICD-10-CM | POA: Insufficient documentation

## 2014-09-28 MED ORDER — TECHNETIUM TC 99M SESTAMIBI GENERIC - CARDIOLITE
11.0000 | Freq: Once | INTRAVENOUS | Status: AC | PRN
  Administered 2014-09-28: 11 via INTRAVENOUS

## 2014-09-28 MED ORDER — REGADENOSON 0.4 MG/5ML IV SOLN
0.4000 mg | Freq: Once | INTRAVENOUS | Status: AC
  Administered 2014-09-28: 0.4 mg via INTRAVENOUS

## 2014-09-28 MED ORDER — TECHNETIUM TC 99M SESTAMIBI GENERIC - CARDIOLITE
33.0000 | Freq: Once | INTRAVENOUS | Status: AC | PRN
  Administered 2014-09-28: 33 via INTRAVENOUS

## 2014-09-28 NOTE — Progress Notes (Signed)
[facility] SITE 3 NUCLEAR MED [address] [place], [place] [address] [phone]    Cardiology Nuclear Med Study  Becky Boyd is a 59 y.o. female     MRN : [id number]     DOB: 1955-06-18  Procedure Date: 09/28/2014  Nuclear Med Background Indication for Stress Test:  Evaluation for Ischemia History:  No known CAD Cardiac Risk Factors: N/A  Symptoms:  Chest Pain, DOE and SOB   Nuclear Pre-Procedure Caffeine/Decaff Intake:  None NPO After: 7:00pm   Lungs:  clear O2 Sat: 98% on room air. IV 0.9% NS with Angio Cath:  22g  IV Site: R Hand  IV Started by:  Bonnita Levan, RN  Chest Size (in):  36 Cup Size: B  Height: 5\' 2"  (1.575 m)  Weight:  146 lb (66.225 kg)  BMI:  Body mass index is 26.7 kg/(m^2). Tech Comments:  N/A    Nuclear Med Study 1 or 2 day study: 1 day  Stress Test Type:  Lexiscan  Reading MD: N/A  Order Authorizing Provider:  , MD  Resting Radionuclide: Technetium 97m Sestamibi  Resting Radionuclide Dose: 11.0 mCi   Stress Radionuclide:  Technetium 3m Sestamibi  Stress Radionuclide Dose: 33.0 mCi           Stress Protocol Rest HR: 73 Stress HR: 114  Rest BP: 126/85 Stress BP: 121/91  Exercise Time (min): n/a METS: n/a   Predicted Max HR: 160 bpm % Max HR: 71.25 bpm Rate Pressure Product: 84m   Dose of Adenosine (mg):  n/a Dose of Lexiscan: 0.4 mg  Dose of Atropine (mg): n/a Dose of Dobutamine: n/a mcg/kg/min (at max HR)  Stress Test Technologist: [id number], BS-ES  Nuclear Technologist:  Nelson Chimes, CNMT     Rest Procedure:  Myocardial perfusion imaging was performed at rest 45 minutes following the intravenous administration of Technetium 39m Sestamibi. Rest ECG: Normal sinus rhythm. Normal EKG.  Stress Procedure:  The patient received IV Lexiscan 0.4 mg over 15-seconds.  Technetium 25m Sestamibi injected at 30-seconds.  Quantitative spect images were obtained after a 45 minute delay.  During the infusion of Lexiscan the  patient complained of lightheaded/dizziness, increased HR, SOB, weakness, nausea and tingling in hands and feet.  These symptoms slowly began to resolve in recovery.  Stress ECG: No significant ST segment change suggestive of ischemia.  QPS Raw Data Images:  Normal; no motion artifact; normal heart/lung ratio. Stress Images:  Mild decreased activity in the lateral wall near the apex Rest Images:  Mild decreased activity in the lateral wall near the apex Subtraction (SDS):  No evidence of ischemia. Transient Ischemic Dilatation (Normal <1.22):  0.98 Lung/Heart Ratio (Normal <0.45):  0.46  Quantitative Gated Spect Images QGS EDV:  63 ml QGS ESV:  20 ml  Impression Exercise Capacity:  Lexiscan with no exercise. BP Response:  Normal blood pressure response. Clinical Symptoms:  Shortness of breath and nausea ECG Impression:  No significant ST segment change suggestive of ischemia. Comparison with Prior Nuclear Study: No images to compare  Overall Impression:  There is no significant abnormality. The quantitative analysis raises the question of slight decreased activity in the lateral wall near the apex. There is no obvious finding on the visual images. Wall motion is normal. I doubt that there is any significant abnormality. There is no definite scar or ischemia. This is a low risk scan.  LV Ejection Fraction: 69%.  LV Wall Motion:  There is excellent wall motion  Willa Rough, MD

## 2014-11-01 ENCOUNTER — Ambulatory Visit
Admission: RE | Admit: 2014-11-01 | Discharge: 2014-11-01 | Disposition: A | Discharge status: Home / Self Care | Payer: Medicare Other | Source: Ambulatory Visit | Attending: Internal Medicine | Admitting: Internal Medicine

## 2014-11-01 ENCOUNTER — Other Ambulatory Visit: Payer: Self-pay | Admitting: Internal Medicine

## 2014-11-01 DIAGNOSIS — R079 Chest pain, unspecified: Secondary | ICD-10-CM

## 2014-11-01 DIAGNOSIS — R0602 Shortness of breath: Secondary | ICD-10-CM

## 2014-11-01 MED ORDER — IOHEXOL 350 MG/ML SOLN
80.0000 mL | Freq: Once | INTRAVENOUS | Status: AC | PRN
Start: 2014-11-01 — End: 2014-11-01
  Administered 2014-11-01: 80 mL via INTRAVENOUS

## 2014-11-09 ENCOUNTER — Ambulatory Visit (HOSPITAL_BASED_OUTPATIENT_CLINIC_OR_DEPARTMENT_OTHER)
Admission: RE | Admit: 2014-11-09 | Discharge: 2014-11-09 | Disposition: A | Discharge status: Home / Self Care | Payer: Medicare Other | Source: Ambulatory Visit | Attending: Family Medicine | Admitting: Family Medicine

## 2014-11-09 ENCOUNTER — Ambulatory Visit (INDEPENDENT_AMBULATORY_CARE_PROVIDER_SITE_OTHER): Payer: Medicare Other | Admitting: Family Medicine

## 2014-11-09 ENCOUNTER — Encounter: Payer: Self-pay | Admitting: Family Medicine

## 2014-11-09 VITALS — BP 124/80 | HR 93 | Temp 98.1°F | Resp 16 | Wt 146.1 lb

## 2014-11-09 DIAGNOSIS — M4316 Spondylolisthesis, lumbar region: Secondary | ICD-10-CM | POA: Diagnosis not present

## 2014-11-09 DIAGNOSIS — R0602 Shortness of breath: Secondary | ICD-10-CM | POA: Diagnosis not present

## 2014-11-09 DIAGNOSIS — M5416 Radiculopathy, lumbar region: Secondary | ICD-10-CM | POA: Insufficient documentation

## 2014-11-09 MED ORDER — PREDNISONE 10 MG PO TABS: ORAL_TABLET | ORAL | Status: DC

## 2014-11-09 NOTE — Progress Notes (Signed)
Pre visit review using our clinic review tool, if applicable. No additional management support is needed unless otherwise documented below in the visit note. 

## 2014-11-09 NOTE — Progress Notes (Signed)
   Subjective:    Patient ID: Becky Boyd, female    DOB: [birth year], 59 y.o.   MRN: [id number]  HPI Leg pain- pt got sandwiched between 2 large dogs on Sunday, twisted awkwardly and fell into side of chair.  Pain was instant and severe.  Pain was initially in L buttock but now across lower back and into L hip.  No weakness or numbness of leg.  No bowel or bladder incontinence.  Taking muscle relaxer, mobic, and 3 tylenol and will lie down.  If pt attempts to walk or stand, pain returns instantly.  Currently on 5mg  prednisone.  Does not have ortho.  SOB- sxs started over 6 months ago.  Pt had normal cardiac w/u after sharing sxs w/ Rheum.  Had normal CT ang.  sxs have been getting progressively worse.  Pt states she is unable to walk or exercise as she wants to.  Reports sensation of air trapping at level of midsternum and that she can't get a full breath.  Has not seen pulmonary.   Review of Systems For ROS see HPI     Objective:   Physical Exam  Constitutional: She is oriented to person, place, and time. She appears well-developed and well-nourished. She appears distressed (obviously uncomfortable).  HENT:  Head: Normocephalic and atraumatic.  Cardiovascular: Normal rate, regular rhythm, normal heart sounds and intact distal pulses.   Pulmonary/Chest: Effort normal and breath sounds normal. No respiratory distress. She has no wheezes. She has no rales.  Musculoskeletal: She exhibits tenderness (TTP over lumbar spine, L paraspinal muscle, L glute, L greater trochanteric bursa). She exhibits no edema.  Neurological: She is alert and oriented to person, place, and time.  Hyper-reflexic on L + SLR on L at 45 degrees  Skin: Skin is warm and dry. No erythema.  Vitals reviewed.         Assessment & Plan:

## 2014-11-09 NOTE — Patient Instructions (Signed)
Follow up as needed Go downstairs and get your xray done prior to leaving HOLD your current prednisone and START the taper as directed- take w/ food Continue your muscle relaxer Do NOT take the mobic while on the higher dose of prednisone HEAT! We'll call you with your pulmonary appt for the shortness of breath Call with any questions or concerns- particularly if pain is not improving after 7-10 days Hang in there!!!

## 2014-11-10 ENCOUNTER — Other Ambulatory Visit: Payer: Self-pay | Admitting: Family Medicine

## 2014-11-10 DIAGNOSIS — M5136 Other intervertebral disc degeneration, lumbar region: Secondary | ICD-10-CM

## 2014-11-10 DIAGNOSIS — M51369 Other intervertebral disc degeneration, lumbar region without mention of lumbar back pain or lower extremity pain: Secondary | ICD-10-CM

## 2014-11-10 DIAGNOSIS — M5416 Radiculopathy, lumbar region: Secondary | ICD-10-CM

## 2014-11-10 NOTE — Assessment & Plan Note (Signed)
New.  Pt had recent normal cardiac workup.  Continues to struggle w/ shortness of breath w/ minimal exertion.  Asymptomatic today in office.  Based on >6 month duration, will refer to pulmonary for complete evaluation and tx.  Pt expressed understanding and is in agreement w/ plan.

## 2014-11-10 NOTE — Assessment & Plan Note (Signed)
New.  Pt w/ severe pain after a twisting fall on Sunday.  Will get xrays to assess.  Start increased dose of prednisone and taper to relieve inflammation.  Continue pain meds, muscle relaxers prn.  Reviewed supportive care and red flags that should prompt return.  Pt expressed understanding and is in agreement w/ plan.

## 2014-12-19 ENCOUNTER — Other Ambulatory Visit: Payer: Self-pay | Admitting: Family Medicine

## 2014-12-20 NOTE — Telephone Encounter (Signed)
No refills without a cholesterol follow up.

## 2015-01-27 ENCOUNTER — Other Ambulatory Visit: Payer: Self-pay | Admitting: Family Medicine

## 2015-01-27 NOTE — Telephone Encounter (Signed)
Med filled, pt needs a cholesterol follow up.

## 2015-02-20 LAB — BASIC METABOLIC PANEL
BUN: 18 mg/dL (ref 4–21)
CREATININE: 0.9 mg/dL (ref <=1.1)
Glucose: 107 mg/dL
Potassium: 4 mmol/L (ref 3.4–5.3)
Sodium: 145 mmol/L (ref 137–147)

## 2015-02-20 LAB — CBC AND DIFFERENTIAL
HCT: 38 % (ref 36–46)
Hemoglobin: 12.7 g/dL (ref 12.0–16.0)
PLATELETS: 265 10*3/uL (ref 150–399)
WBC: 5.2 10*3/mL

## 2015-02-20 LAB — HEPATIC FUNCTION PANEL
ALT: 20 U/L (ref 7–35)
AST: 22 U/L (ref 13–35)
Alkaline Phosphatase: 107 U/L (ref 25–125)
Bilirubin, Total: 0.4 mg/dL

## 2015-03-02 ENCOUNTER — Ambulatory Visit (INDEPENDENT_AMBULATORY_CARE_PROVIDER_SITE_OTHER): Payer: Medicare Other | Admitting: Family Medicine

## 2015-03-02 ENCOUNTER — Encounter: Payer: Self-pay | Admitting: Family Medicine

## 2015-03-02 VITALS — BP 124/80 | HR 98 | Temp 98.6°F | Resp 16 | Wt 148.2 lb

## 2015-03-02 DIAGNOSIS — R319 Hematuria, unspecified: Secondary | ICD-10-CM

## 2015-03-02 LAB — POCT URINALYSIS DIPSTICK
BILIRUBIN UA: NEGATIVE
Glucose, UA: NEGATIVE
Ketones, UA: NEGATIVE
NITRITE UA: NEGATIVE
PH UA: 6
Protein, UA: NEGATIVE
Urobilinogen, UA: 0.2

## 2015-03-02 MED ORDER — CIPROFLOXACIN HCL 500 MG PO TABS: 500.0000 mg | ORAL_TABLET | Freq: Two times a day (BID) | ORAL | Status: DC

## 2015-03-02 NOTE — Patient Instructions (Signed)
Follow up as needed Start the Cipro twice daily- take w/ food Drink plenty of fluids We'll call you with your CT scan to assess for stone Call with any questions or concerns Hang in there!!!

## 2015-03-02 NOTE — Progress Notes (Signed)
   Subjective:    Patient ID: Becky Boyd, female    DOB: [birth year], 59 y.o.   MRN: [id number]  HPI Hematuria- pt had blood in urine at Rheumatology office and was sent here to repeat.  Denies dysuria.  'i'm assuming I have a kidney stone'.  Hx of similar.  Pt is reports bilateral kidney pain.  Pt has been 'peeing red blood' or 'real dark like the color of pepsi'.  Pt reports but sxs have been present for 'a couple of months'.  + urinary frequency.  + urgency.  Denies incomplete emptying.  No fevers.  Vomited x1 last week.  Uro- [facility] (last seen 6 yrs ago).   Review of Systems For ROS see HPI     Objective:   Physical Exam  Constitutional: She is oriented to person, place, and time. She appears well-developed and well-nourished. No distress.  HENT:  Head: Normocephalic and atraumatic.  Abdominal: Soft. She exhibits no distension. There is tenderness (L CVA tenderness, no suprapubic tenderness).  Musculoskeletal: She exhibits no edema.  Neurological: She is alert and oriented to person, place, and time.  Skin: Skin is warm and dry.  Psychiatric: She has a normal mood and affect. Her behavior is normal. Thought content normal.  Vitals reviewed.         Assessment & Plan:

## 2015-03-02 NOTE — Assessment & Plan Note (Signed)
New to provider, ongoing for pt.  She reports 'months' of sxs.  Admonished her for waiting this long with gross hematuria- pt acknowledges that this was not a good idea.  + frequency, urgency, hematuria.  Hx of renal stones requiring lithotripsy and surgical removal in the past.  Has not seen uro in over 6 yrs.  Start Cipro for probable UTI but check renal stone CT to assess for possible stone/hydronephrosis.  Reviewed supportive care and red flags that should prompt return.  Pt expressed understanding and is in agreement w/ plan.

## 2015-03-02 NOTE — Progress Notes (Signed)
Pre visit review using our clinic review tool, if applicable. No additional management support is needed unless otherwise documented below in the visit note. 

## 2015-03-03 ENCOUNTER — Encounter: Payer: Self-pay | Admitting: General Practice

## 2015-03-03 ENCOUNTER — Ambulatory Visit (HOSPITAL_BASED_OUTPATIENT_CLINIC_OR_DEPARTMENT_OTHER)
Admission: RE | Admit: 2015-03-03 | Discharge: 2015-03-03 | Disposition: A | Discharge status: Home / Self Care | Payer: Medicare Other | Source: Ambulatory Visit | Attending: Family Medicine | Admitting: Family Medicine

## 2015-03-03 DIAGNOSIS — N200 Calculus of kidney: Secondary | ICD-10-CM | POA: Diagnosis not present

## 2015-03-03 DIAGNOSIS — R109 Unspecified abdominal pain: Secondary | ICD-10-CM | POA: Insufficient documentation

## 2015-03-03 DIAGNOSIS — R319 Hematuria, unspecified: Secondary | ICD-10-CM

## 2015-03-03 LAB — URINE CULTURE
COLONY COUNT: NO GROWTH
Organism ID, Bacteria: NO GROWTH

## 2015-03-06 NOTE — Addendum Note (Signed)
Addended by: Neldon Labella on: 03/06/2015 05:05 PM   Modules accepted: Orders

## 2015-03-10 ENCOUNTER — Other Ambulatory Visit: Payer: Self-pay | Admitting: Family Medicine

## 2015-03-10 NOTE — Telephone Encounter (Signed)
Rx sent to the pharmacy by e-script.  Pt needs an appt for follow-up on cholesterol.//AB/CMA

## 2015-03-31 ENCOUNTER — Other Ambulatory Visit: Payer: Self-pay | Admitting: Endocrinology

## 2015-03-31 DIAGNOSIS — R1310 Dysphagia, unspecified: Secondary | ICD-10-CM

## 2015-04-13 ENCOUNTER — Ambulatory Visit
Admission: RE | Admit: 2015-04-13 | Discharge: 2015-04-13 | Disposition: A | Discharge status: Home / Self Care | Payer: Medicare Other | Source: Ambulatory Visit | Attending: Endocrinology | Admitting: Endocrinology

## 2015-04-13 DIAGNOSIS — R1310 Dysphagia, unspecified: Secondary | ICD-10-CM

## 2015-04-26 ENCOUNTER — Other Ambulatory Visit: Payer: Self-pay | Admitting: Family Medicine

## 2015-06-02 ENCOUNTER — Telehealth: Payer: Self-pay | Admitting: Family Medicine

## 2015-06-02 ENCOUNTER — Other Ambulatory Visit: Payer: Self-pay | Admitting: Family Medicine

## 2015-06-02 MED ORDER — ATORVASTATIN CALCIUM 20 MG PO TABS: 20.0000 mg | ORAL_TABLET | Freq: Every day | ORAL | Status: DC

## 2015-06-02 NOTE — Telephone Encounter (Signed)
Relation to JQ:ZESP Call back number:[phone] Pharmacy: [facility] # [address] [place] - [address] [phone] (Phone) [phone] (Fax)         Reason for call:  Pharmacy unable to print atorvastatin (LIPITOR) 20 MG tablet script requesting office to e scribe again.

## 2015-06-02 NOTE — Telephone Encounter (Signed)
Medication filled to pharmacy as requested.   

## 2015-08-01 ENCOUNTER — Telehealth: Payer: Self-pay | Admitting: Family Medicine

## 2015-08-01 NOTE — Telephone Encounter (Signed)
LM for pt to call and schedule flu shot or update records. °

## 2015-09-07 ENCOUNTER — Ambulatory Visit (INDEPENDENT_AMBULATORY_CARE_PROVIDER_SITE_OTHER): Payer: Medicare Other | Admitting: Family Medicine

## 2015-09-07 ENCOUNTER — Encounter: Payer: Self-pay | Admitting: Family Medicine

## 2015-09-07 VITALS — BP 132/82 | HR 90 | Temp 98.2°F | Resp 17 | Ht 63.0 in | Wt 142.4 lb

## 2015-09-07 DIAGNOSIS — M545 Low back pain, unspecified: Secondary | ICD-10-CM

## 2015-09-07 DIAGNOSIS — M5489 Other dorsalgia: Secondary | ICD-10-CM

## 2015-09-07 DIAGNOSIS — J0100 Acute maxillary sinusitis, unspecified: Secondary | ICD-10-CM | POA: Diagnosis not present

## 2015-09-07 MED ORDER — GUAIFENESIN-CODEINE 100-10 MG/5ML PO SYRP: 10.0000 mL | ORAL_SOLUTION | Freq: Three times a day (TID) | ORAL | Status: DC | PRN

## 2015-09-07 MED ORDER — BENZONATATE 200 MG PO CAPS: 200.0000 mg | ORAL_CAPSULE | Freq: Two times a day (BID) | ORAL | Status: DC | PRN

## 2015-09-07 MED ORDER — AMOXICILLIN 875 MG PO TABS: 875.0000 mg | ORAL_TABLET | Freq: Two times a day (BID) | ORAL | Status: DC

## 2015-09-07 NOTE — Patient Instructions (Addendum)
Follow up as needed Start the Amoxicillin twice daily Drink plenty of fluids Codeine cough syrup as needed- add Delsym as needed Use the cough pills as needed REST! Call with any questions or concerns- particularly if not improving We'll call you with your ortho appt Thanks for sticking with [place]! Hang in there!!!

## 2015-09-07 NOTE — Progress Notes (Signed)
Pre visit review using our clinic review tool, if applicable. No additional management support is needed unless otherwise documented below in the visit note. 

## 2015-09-07 NOTE — Assessment & Plan Note (Signed)
Ongoing issue for pt.  Referral placed to Dr Jillyn Hidden at pt's request

## 2015-09-07 NOTE — Assessment & Plan Note (Signed)
New.  Pt's sxs and PE consistent w/ infxn.  Start abx.  Cough syrup prn.  Reviewed supportive care and red flags that should prompt return.  Pt expressed understanding and is in agreement w/ plan.  

## 2015-09-07 NOTE — Progress Notes (Signed)
   Subjective:    Patient ID: Becky Boyd, female    DOB: November 04, 1955, 60 y.o.   MRN: [id number]  HPI URI- sxs started ~2 weeks ago w/ cough.  Pt reports she felt better on Saturday but sxs returned and worsened.  + hoarseness x2 days.  + sore throat.  SOB when coughing.  Cough is productive of white sputum.  + nasal congestion.  + N/V x1 last week.  Dizziness during coughing spells.  R ear pain.  + sinus pressure, pressure w/ leaning forward.  Using Robitussin w/ some relief.  Ibuprofen for back pain.  No recent sick contacts.  LBP- pt has hx of back surgery.  Reports ongoing pain.  Would like re-evaluation   Review of Systems For ROS see HPI     Objective:   Physical Exam  Constitutional: She appears well-developed and well-nourished. No distress.  HENT:  Head: Normocephalic and atraumatic.  Right Ear: Tympanic membrane normal.  Left Ear: Tympanic membrane normal.  Nose: Mucosal edema and rhinorrhea present. Right sinus exhibits maxillary sinus tenderness and frontal sinus tenderness. Left sinus exhibits maxillary sinus tenderness and frontal sinus tenderness.  Mouth/Throat: Uvula is midline and mucous membranes are normal. Posterior oropharyngeal erythema present. No oropharyngeal exudate.  Eyes: Conjunctivae and EOM are normal. Pupils are equal, round, and reactive to light.  Neck: Normal range of motion. Neck supple.  Cardiovascular: Normal rate, regular rhythm and normal heart sounds.   Pulmonary/Chest: Effort normal and breath sounds normal. No respiratory distress. She has no wheezes.  Lymphadenopathy:    She has no cervical adenopathy.  Vitals reviewed.         Assessment & Plan:

## 2015-10-31 ENCOUNTER — Telehealth: Payer: Self-pay

## 2015-10-31 NOTE — Telephone Encounter (Signed)
Patient is on the list for [employer] 2017 and may be a good candidate for an AWV in 2017.  

## 2015-11-01 NOTE — Telephone Encounter (Signed)
Please contact patient to set up AVW and let Stefannie know once they are scheduled.  °

## 2015-11-03 NOTE — Telephone Encounter (Signed)
LM for pt to return my call to schedule appt.

## 2015-11-13 NOTE — Telephone Encounter (Signed)
2nd attempt to reach pt to schedule AWV, LM for pt to return call.

## 2015-11-28 NOTE — Telephone Encounter (Signed)
Pt not responding to attempts to contact.

## 2016-01-01 ENCOUNTER — Encounter: Payer: Self-pay | Admitting: Family Medicine

## 2016-02-12 ENCOUNTER — Telehealth: Payer: Self-pay | Admitting: Family Medicine

## 2016-02-12 NOTE — Telephone Encounter (Signed)
Spoke with pt she was advised by myself and Dr. Beverely Low to go to ED NOW! Pt declined was very argumentative despite numerous attempts to get pt to be evaluated. Pt ended conversation with the statement "I will give it serius consideration"

## 2016-02-12 NOTE — Telephone Encounter (Signed)
If pt unable to close eye and having facial droop (inability to speak out of R side of mouth) she MUST go to the ER for evaluation as this may be a stroke rather than Bell's Palsy

## 2016-02-12 NOTE — Telephone Encounter (Signed)
[facility] [place] [facility] TELEPHONE ADVICE RECORD [facility]  Patient Name: Becky Boyd  DOB: 1956-03-13    Initial Comment Caller states c/o unable to taste food, talking out of the left side of her mouth, can't blink right eye and has to hold right eye closed to sleep and pain behind right ear. She is concerned she may have Bell's Palsy.   Nurse Assessment  Nurse: Laural Benes, RN, Dondra Spry Date/Time Lamount Cohen Time): 02/12/2016 10:27:52 AM  Confirm and document reason for call. If symptomatic, describe symptoms. You must click the next button to save text entered. ---[name] felt drool one month ago; (has Sjorgern's Disease) c/o unable to taste food, woke up with headache and talking out of the left side of her mouth, can't blink right eye and has to hold right eye closed to sleep and pain behind right ear.  Has the patient traveled out of the country within the last 30 days? ---No  Does the patient have any new or worsening symptoms? ---Yes  Will a triage be completed? ---Yes  Related visit to physician within the last 2 weeks? ---No  Does the PT have any chronic conditions? (i.e. diabetes, asthma, etc.) ---No  Is this a behavioral health or substance abuse call? ---No     Guidelines    Guideline Title Affirmed Question Affirmed Notes  Neurologic Deficit Bell's palsy suspected (i.e., weakness on only one side of the face, developing over hours to days, no other symptoms)    Final Disposition User   Go to ED Now (or PCP triage) Laural Benes, RN, Dondra Spry    Comments  NOTE triage outcome ED did not want to go to ED wants appt with Dr. Beverely Low 02-13-2016 145am appt w/Dr. Beverely Low r/o bells palsy   Referrals  REFERRED TO PCP OFFICE   Disagree/Comply: Disagree  Disagree/Comply Reason: Disagree with instructions

## 2016-02-12 NOTE — Telephone Encounter (Signed)
Please advise, pt refused ED and made appt with you tomorrow.

## 2016-02-13 ENCOUNTER — Ambulatory Visit
Admission: RE | Admit: 2016-02-13 | Discharge: 2016-02-13 | Disposition: A | Discharge status: Home / Self Care | Payer: Medicare Other | Source: Ambulatory Visit | Attending: Family Medicine | Admitting: Family Medicine

## 2016-02-13 ENCOUNTER — Ambulatory Visit (INDEPENDENT_AMBULATORY_CARE_PROVIDER_SITE_OTHER): Payer: Medicare Other | Admitting: Family Medicine

## 2016-02-13 ENCOUNTER — Encounter: Payer: Self-pay | Admitting: Family Medicine

## 2016-02-13 DIAGNOSIS — G510 Bell's palsy: Secondary | ICD-10-CM

## 2016-02-13 LAB — BASIC METABOLIC PANEL
BUN: 14 mg/dL (ref 6–23)
CALCIUM: 9.2 mg/dL (ref 8.4–10.5)
CO2: 30 meq/L (ref 19–32)
CREATININE: 0.9 mg/dL (ref 0.40–1.20)
Chloride: 107 mEq/L (ref 96–112)
GFR: 67.4 mL/min (ref >60.00)
GLUCOSE: 96 mg/dL (ref 70–99)
Potassium: 3.5 mEq/L (ref 3.5–5.1)
Sodium: 141 mEq/L (ref 135–145)

## 2016-02-13 MED ORDER — PREDNISONE 20 MG PO TABS: 60.0000 mg | ORAL_TABLET | Freq: Every day | ORAL | 0 refills | Status: DC

## 2016-02-13 MED ORDER — GADOBENATE DIMEGLUMINE 529 MG/ML IV SOLN
13.0000 mL | Freq: Once | INTRAVENOUS | Status: AC | PRN
  Administered 2016-02-13: 13 mL via INTRAVENOUS

## 2016-02-13 MED ORDER — VALACYCLOVIR HCL 1 G PO TABS: 1000.0000 mg | ORAL_TABLET | Freq: Three times a day (TID) | ORAL | 0 refills | Status: DC

## 2016-02-13 NOTE — Assessment & Plan Note (Signed)
New.  Pt's sxs and PE are consistent w/ Bells Palsy but given her complicated hx- Sjogren's, nonspecific white matter changes on previous MRI- it is necessary to get imaging to r/o other possible causes of facial nerve palsy.  Called radiology to ask which imaging would be most appropriate and MRI w/o and w/ contrast w/ IAC protocol was advised.  Orders entered.  Will start pt on Prednisone and Valtrex.  Pt to call her neurologist and advise him of the current situation.  Reviewed supportive care and red flags that should prompt return.  Pt expressed understanding and is in agreement w/ plan.

## 2016-02-13 NOTE — Progress Notes (Signed)
   Subjective:    Patient ID: Becky Boyd, female    DOB: 07-28-1955, 60 y.o.   MRN: [id number]  HPI Facial droop- sxs started 3 days ago.  Drooling out of R side of mouth.  R eye will not close completely- has to close w/ tissues at night.  + blurry vision.  Can't taste food, can't drink through a straw.  Since sxs started, sxs have remained the same- not better or worse.  Inside of L cheek feels numb.  No numbness or weakness of arms/legs.  No difficult w/ word finding or speaking   Review of Systems For ROS see HPI     Objective:   Physical Exam  Constitutional: She is oriented to person, place, and time. She appears well-developed and well-nourished. No distress.  HENT:  Head: Normocephalic and atraumatic.  Right Ear: External ear normal.  Left Ear: External ear normal.  TMs WNL bilaterally  Eyes: Conjunctivae and EOM are normal. Pupils are equal, round, and reactive to light. Right eye exhibits no discharge. Left eye exhibits no discharge.  Neurological: She is alert and oriented to person, place, and time. She has normal reflexes. A cranial nerve deficit is present. Coordination normal.  R hand tremor Minimal R forehead movement, able to raise eyebrow on L Unable to fully close R eye R facial droop- talking out of L side of mouth which is pulling upward No weakness of upper or lower extremities bilaterally  Skin: Skin is warm and dry. No rash noted. No erythema.  Psychiatric: She has a normal mood and affect. Her behavior is normal. Thought content normal.  Vitals reviewed.         Assessment & Plan:

## 2016-02-13 NOTE — Patient Instructions (Signed)
We will notify you of your MRI results Start the Prednisone- 3 tabs at the same time x7 days (take w/ food) Start the Valtrex- 1 tab 3x/day (at each meal) x7 days Use artificial tears to prevent your eye from drying Please call Dr Vela Prose and notify him of your situation Call with any questions or concerns Hang in there!!!

## 2016-02-13 NOTE — Progress Notes (Signed)
Pre visit review using our clinic review tool, if applicable. No additional management support is needed unless otherwise documented below in the visit note. 

## 2016-03-02 ENCOUNTER — Other Ambulatory Visit: Payer: Self-pay | Admitting: Family Medicine

## 2016-05-01 ENCOUNTER — Telehealth: Payer: Self-pay | Admitting: Family Medicine

## 2016-05-01 NOTE — Telephone Encounter (Signed)
Becky Boyd w/[facility] calling asking if atorvastation could be for a 90 day supply, caller states that pt would benefit if it was.

## 2016-05-01 NOTE — Telephone Encounter (Signed)
Called and left a message with pharmacy to inform that we cannot fill this for #90 as the pat has not been seen for cholesterol in over a year. Pt needs a cholesterol follow up.

## 2016-05-08 ENCOUNTER — Other Ambulatory Visit: Payer: Self-pay | Admitting: Neurology

## 2016-05-08 DIAGNOSIS — M545 Low back pain: Secondary | ICD-10-CM

## 2016-05-22 ENCOUNTER — Other Ambulatory Visit: Payer: Medicare Other

## 2016-05-29 ENCOUNTER — Ambulatory Visit
Admission: RE | Admit: 2016-05-29 | Discharge: 2016-05-29 | Disposition: A | Discharge status: Home / Self Care | Payer: Medicare Other | Source: Ambulatory Visit | Attending: Neurology | Admitting: Neurology

## 2016-05-29 DIAGNOSIS — M545 Low back pain: Secondary | ICD-10-CM

## 2016-05-29 MED ORDER — GADOBENATE DIMEGLUMINE 529 MG/ML IV SOLN
13.0000 mL | Freq: Once | INTRAVENOUS | Status: AC | PRN
  Administered 2016-05-29: 13 mL via INTRAVENOUS

## 2016-06-04 IMAGING — RF DG ESOPHAGUS
15 of 19 series · 19 of 24 positions shown · non-contrast
Comparison: Barium swallow of 06/14/2004

CLINICAL DATA: Dysphagia

EXAM:
ESOPHOGRAM/BARIUM SWALLOW
TECHNIQUE: Single contrast examination was performed using  thin barium.
FLUOROSCOPY TIME:  Radiation Exposure Index (as provided by the
fluoroscopic device): 41 deciGy per square cm
If the device does not provide the exposure index:
Fluoroscopy Time:  1 minutes 24 seconds
Number of Acquired Images:

[Series 1: run · 3 of 8 slices shown (1 of 15)]
[im 1/8]
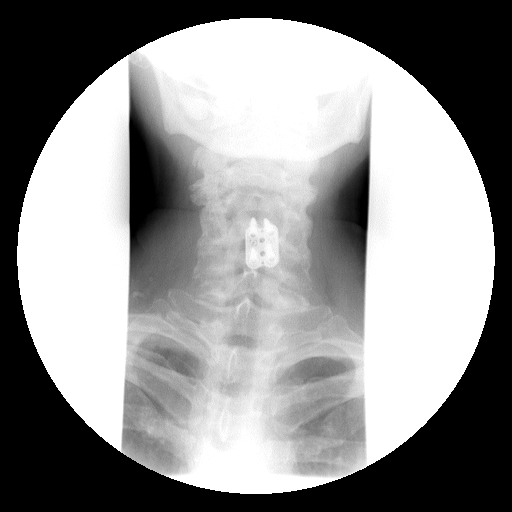
[im 3/8]
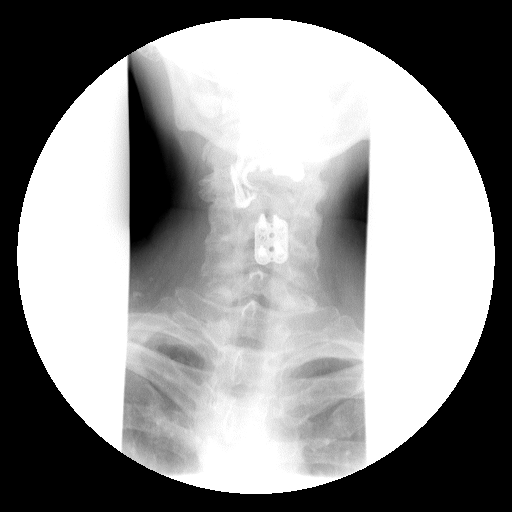
[im 8/8]
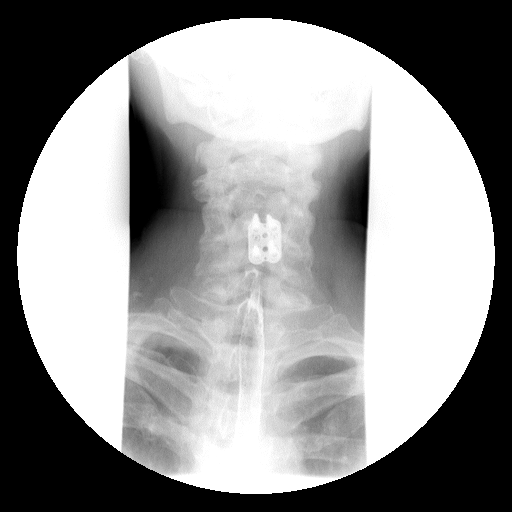

[Series 2: run · 3 of 6 slices shown (2 of 15)]
[im 1/6]
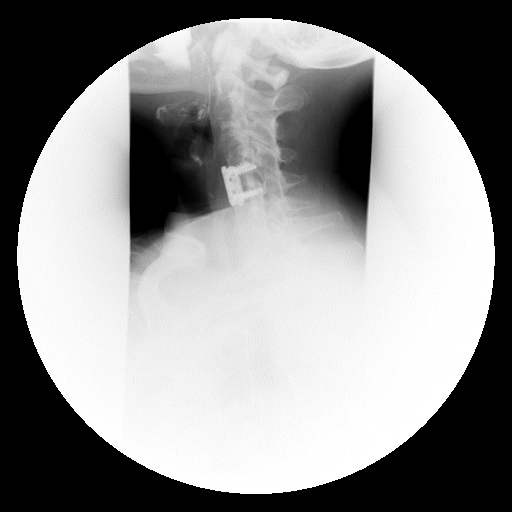
[im 3/6]
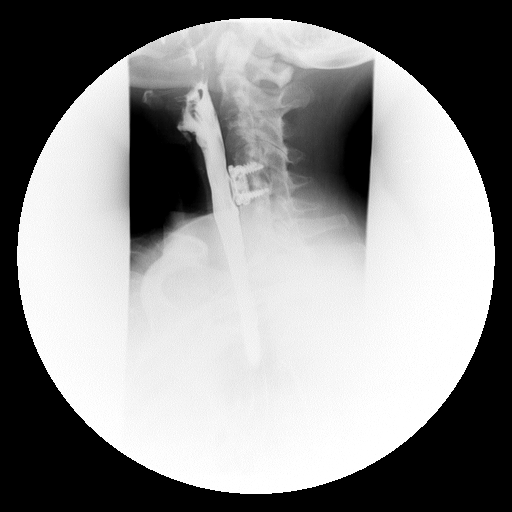
[im 6/6]
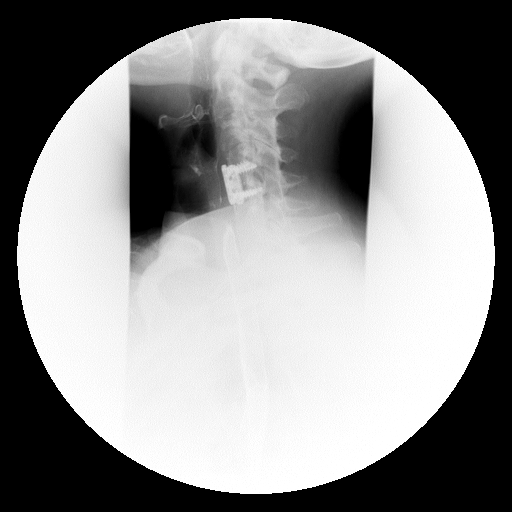

[Series 4: run · 1 of 1 slices shown (3 of 15)]
[im 1/1]
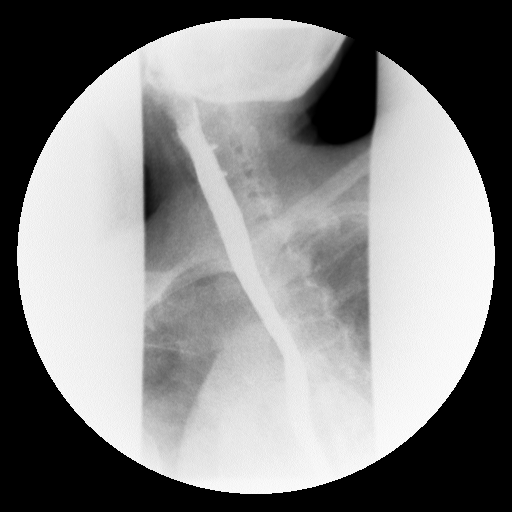

[Series 5: run · 1 of 1 slices shown (4 of 15)]
[im 1/1]
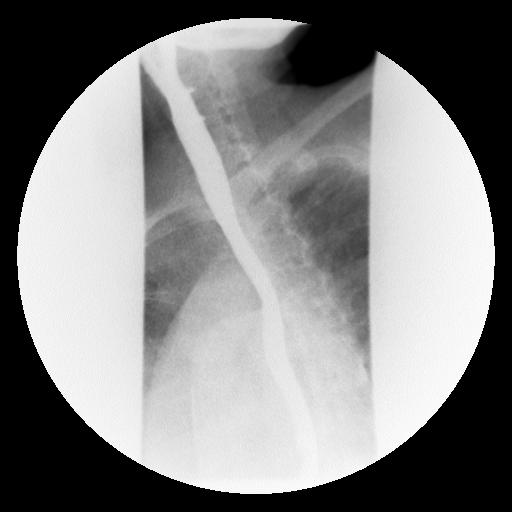

[Series 6: run · 1 of 1 slices shown (5 of 15)]
[im 1/1]
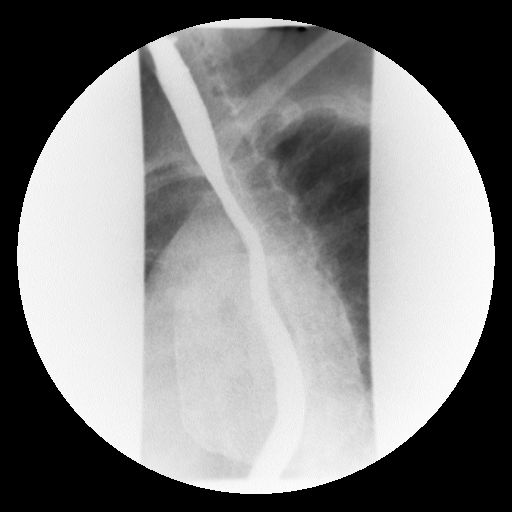

[Series 8: run · 1 of 1 slices shown (6 of 15)]
[im 1/1]
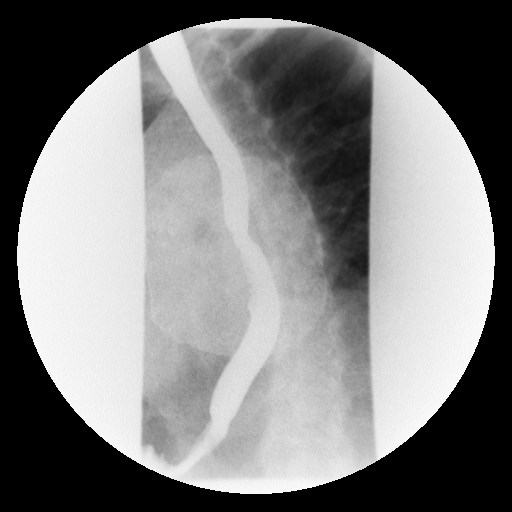

[Series 9: run · 1 of 1 slices shown (7 of 15)]
[im 1/1]
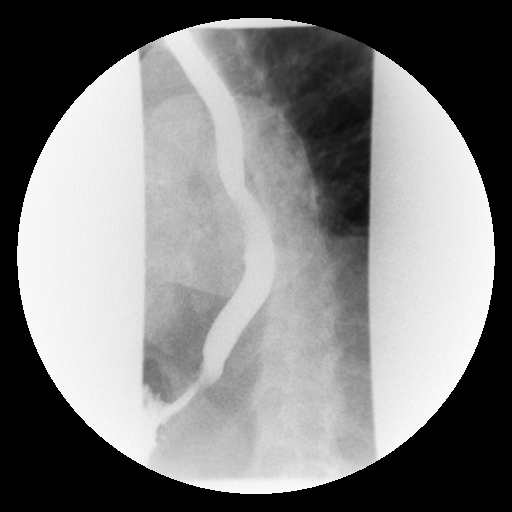

[Series 10: run · 1 of 1 slices shown (8 of 15)]
[im 1/1]
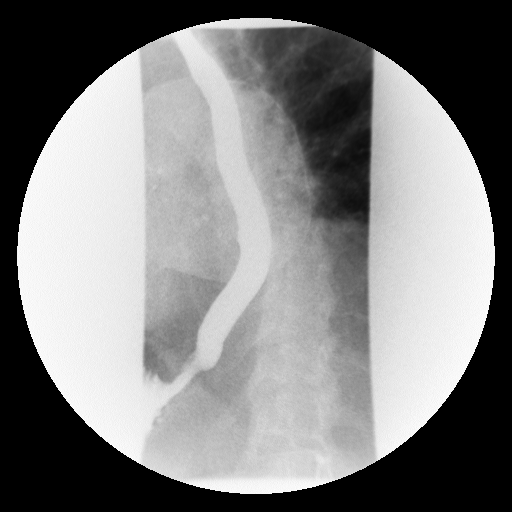

[Series 11: run · 1 of 1 slices shown (9 of 15)]
[im 1/1]
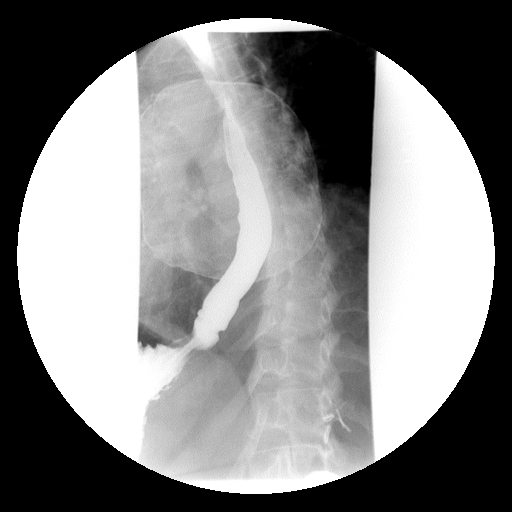

[Series 13: run · 1 of 1 slices shown (10 of 15)]
[im 1/1]
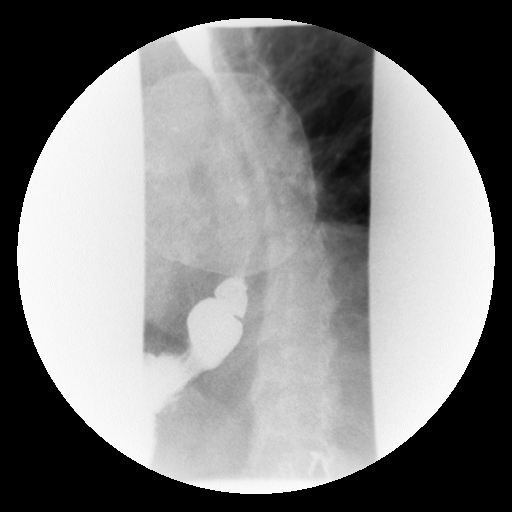

[Series 14: run · 1 of 1 slices shown (11 of 15)]
[im 1/1]
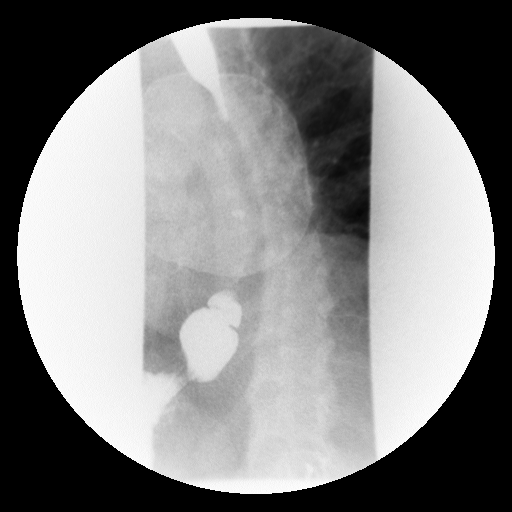

[Series 15: run · 1 of 1 slices shown (12 of 15)]
[im 1/1]
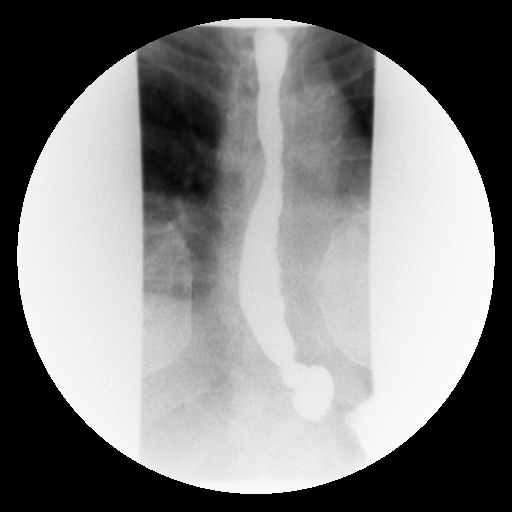

[Series 16: run · 1 of 1 slices shown (13 of 15)]
[im 1/1]
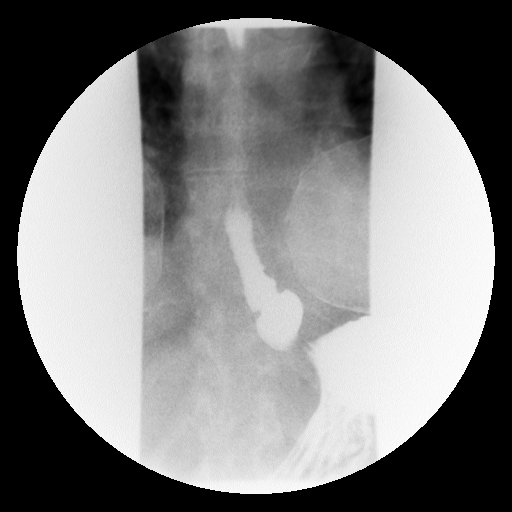

[Series 18: run · 1 of 1 slices shown (14 of 15)]
[im 1/1]
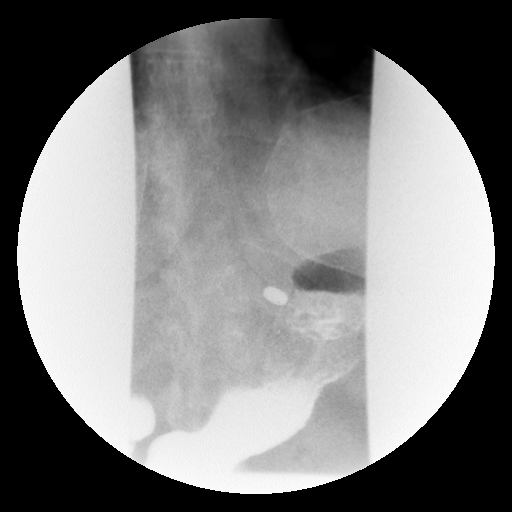

[Series 19: run · 1 of 1 slices shown (15 of 15)]
[im 1/1]
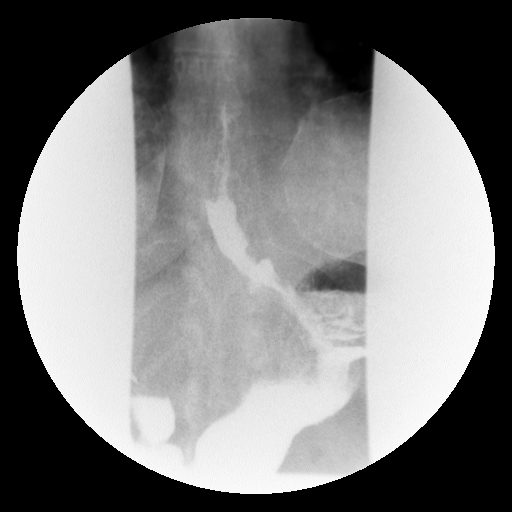

[19 of 24 positions shown; findings below may reference images not displayed]

FINDINGS: Initially rapid sequence spot films of the cervical esophagus were
performed. The swallowing mechanism is unremarkable. There are mild
tertiary contractions in the mid and distal esophagus. A small
hiatal hernia is present. Only faint gastroesophageal reflux could
be elicited with the water siphon maneuver. A barium pill was given
which temporarily delayed just above the hiatal hernia, but did pass
intact into the stomach after additional barium and water were
given.
IMPRESSION: 1. Small hiatal hernia with faint gastroesophageal reflux.
2. Barium pill lodges temporarily just above the hiatal hernia but
does pass into the stomach intact.
3. Mild tertiary contractions in the mid and distal esophagus.

## 2016-11-27 ENCOUNTER — Other Ambulatory Visit: Payer: Self-pay | Admitting: Family Medicine

## 2017-01-27 ENCOUNTER — Encounter: Payer: Self-pay | Admitting: Family Medicine

## 2017-01-27 ENCOUNTER — Telehealth: Payer: Self-pay | Admitting: *Deleted

## 2017-01-27 ENCOUNTER — Encounter: Payer: Self-pay | Admitting: General Practice

## 2017-01-27 ENCOUNTER — Ambulatory Visit (INDEPENDENT_AMBULATORY_CARE_PROVIDER_SITE_OTHER): Payer: Medicare Other | Admitting: Family Medicine

## 2017-01-27 VITALS — BP 130/80 | HR 76 | Temp 98.4°F | Resp 16 | Ht 63.0 in | Wt 148.0 lb

## 2017-01-27 DIAGNOSIS — E785 Hyperlipidemia, unspecified: Secondary | ICD-10-CM | POA: Diagnosis not present

## 2017-01-27 DIAGNOSIS — M542 Cervicalgia: Secondary | ICD-10-CM | POA: Diagnosis not present

## 2017-01-27 DIAGNOSIS — R3915 Urgency of urination: Secondary | ICD-10-CM | POA: Diagnosis not present

## 2017-01-27 DIAGNOSIS — Z23 Encounter for immunization: Secondary | ICD-10-CM

## 2017-01-27 DIAGNOSIS — R8299 Other abnormal findings in urine: Secondary | ICD-10-CM | POA: Diagnosis not present

## 2017-01-27 DIAGNOSIS — R82998 Other abnormal findings in urine: Secondary | ICD-10-CM

## 2017-01-27 LAB — POCT URINALYSIS DIPSTICK
Bilirubin, UA: NEGATIVE
RBC UA: NEGATIVE
Spec Grav, UA: 1.02 (ref 1.010–1.025)
Urobilinogen, UA: 0.2 E.U./dL
pH, UA: 5 (ref 5.0–8.0)

## 2017-01-27 LAB — LIPID PANEL
CHOLESTEROL: 167 mg/dL (ref 0–200)
HDL: 62.6 mg/dL (ref >39.00)
LDL Cholesterol: 82 mg/dL (ref 0–99)
NONHDL: 103.93
Total CHOL/HDL Ratio: 3
Triglycerides: 108 mg/dL (ref 0.0–149.0)
VLDL: 21.6 mg/dL (ref 0.0–40.0)

## 2017-01-27 LAB — HEPATIC FUNCTION PANEL
ALBUMIN: 4.2 g/dL (ref 3.5–5.2)
ALK PHOS: 95 U/L (ref 39–117)
ALT: 17 U/L (ref 0–35)
AST: 21 U/L (ref 0–37)
BILIRUBIN DIRECT: 0.1 mg/dL (ref 0.0–0.3)
TOTAL PROTEIN: 6.8 g/dL (ref 6.0–8.3)
Total Bilirubin: 0.6 mg/dL (ref 0.2–1.2)

## 2017-01-27 LAB — BASIC METABOLIC PANEL
BUN: 16 mg/dL (ref 6–23)
CALCIUM: 9.3 mg/dL (ref 8.4–10.5)
CHLORIDE: 107 meq/L (ref 96–112)
CO2: 26 meq/L (ref 19–32)
CREATININE: 0.87 mg/dL (ref 0.40–1.20)
GFR: 69.87 mL/min (ref >60.00)
GLUCOSE: 103 mg/dL — AB (ref 70–99)
Potassium: 4 mEq/L (ref 3.5–5.1)
SODIUM: 140 meq/L (ref 135–145)

## 2017-01-27 LAB — TSH: TSH: 0.35 u[IU]/mL (ref 0.35–4.50)

## 2017-01-27 MED ORDER — CEPHALEXIN 500 MG PO CAPS: 500.0000 mg | ORAL_CAPSULE | Freq: Two times a day (BID) | ORAL | 0 refills | Status: AC

## 2017-01-27 NOTE — Assessment & Plan Note (Signed)
Chronic problem.  Tolerating statin w/o difficulty.  Check labs.  Adjust meds prn  

## 2017-01-27 NOTE — Progress Notes (Signed)
Pre visit review using our clinic review tool, if applicable. No additional management support is needed unless otherwise documented below in the visit note. 

## 2017-01-27 NOTE — Telephone Encounter (Signed)
Left message for patient to call back.  Lab was not able to test CBC due to blood clotting before they could test it. Left patient a detailed message that we needed to redraw that tube - this can be done at whichever office is closest to her.

## 2017-01-27 NOTE — Patient Instructions (Signed)
Schedule your complete physical in 3-4 months and your Medicare Wellness Visit at the same time [facility] notify you of your lab results and make any changes if needed Start the Keflex as needed for UTI Drink plenty of fluids Call Dr Otelia Sergeant about the neck and head pain- that is an irritated nerve (which could be due to the hardware) When you are due for your Baclofen refill- let me know!!! Call with any questions or concerns Hang in there!!!

## 2017-01-27 NOTE — Progress Notes (Signed)
   Subjective:    Patient ID: Becky Boyd, female    DOB: September 06, 1955, 61 y.o.   MRN: [id number]  HPI Urinary frequency- pt reports 'constant' need to urinate.  sxs started 1-2 weeks ago.  Pt has hx of kidney stones.  No dysuria.  Tender spot on back of head- pt has area on R occiput that is intermittently TTP.  Pain will radiate up into head and down into neck.  sxs started a couple of months ago.  Nothing improves or worsens pain.  Hyperlipidemia- chronic problem.  On Lipitor 20mg  daily.  Has not been compliant w/ follow ups.  Well overdue for labs.  Denies abd pain, N/V.   Review of Systems For ROS see HPI     Objective:   Physical Exam  Constitutional: She is oriented to person, place, and time. She appears well-developed and well-nourished. No distress.  HENT:  Head: Normocephalic and atraumatic.  Eyes: Pupils are equal, round, and reactive to light. Conjunctivae and EOM are normal.  Neck: Normal range of motion. Neck supple. No thyromegaly present.  Cardiovascular: Normal rate, regular rhythm, normal heart sounds and intact distal pulses.   No murmur heard. Pulmonary/Chest: Effort normal and breath sounds normal. No respiratory distress.  Abdominal: Soft. She exhibits no distension. There is tenderness (suprapubic tenderness).  Musculoskeletal: She exhibits tenderness (TTP over R occiput at origin of trapezius). She exhibits no edema.  Lymphadenopathy:    She has no cervical adenopathy.  Neurological: She is alert and oriented to person, place, and time.  Skin: Skin is warm and dry.  Psychiatric: She has a normal mood and affect. Her behavior is normal.  Vitals reviewed.         Assessment & Plan:  Urinary urgency- pt's sxs and UA consistent w/ infxn.  Start abx while we await urine culture.  Pt expressed understanding and is in agreement w/ plan.   Neck pain- pt is having pain over R occiput/origin of Trap.  Suspect this is due to nerve irritation due to previous  surgeries.  Has pain meds, NSAIDs, and muscle relaxers available to use.  Since these are not effective, I encouraged her to call Dr for evaluation and tx.  Pt expressed understanding and is in agreement w/ plan.

## 2017-01-29 LAB — URINE CULTURE

## 2017-01-29 NOTE — Progress Notes (Signed)
Called pt and lmovm to return call.

## 2017-04-07 ENCOUNTER — Encounter: Payer: Self-pay | Admitting: Family Medicine

## 2017-04-07 MED ORDER — BACLOFEN 10 MG PO TABS: 10.0000 mg | ORAL_TABLET | Freq: Three times a day (TID) | ORAL | 3 refills | Status: DC

## 2017-05-09 ENCOUNTER — Other Ambulatory Visit: Payer: Self-pay | Admitting: Family Medicine

## 2017-05-28 ENCOUNTER — Encounter: Payer: Medicare Other | Admitting: Family Medicine

## 2017-06-04 NOTE — Progress Notes (Deleted)
Subjective:   Becky Boyd is a 62 y.o. female who presents for Medicare Annual (Subsequent) preventive examination.  Review of Systems:  No ROS.  Medicare Wellness Visit. Additional risk factors are reflected in the social history.    Sleep patterns: Home Safety/Smoke Alarms: Feels safe in home. Smoke alarms in place.  Living environment; residence and Firearm Safety:  Seat Belt Safety/Bike Helmet: Wears seat belt.   Female:   Pap-2014       Mammo-       Dexa scan-??06/18/2011         CCS-??Colonoscopy 2010?     Objective:     Vitals: There were no vitals taken for this visit.  There is no height or weight on file to calculate BMI.  No flowsheet data found.  Tobacco Social History   Tobacco Use  Smoking Status Never Smoker  Smokeless Tobacco Never Used     Counseling given: Not Answered     Past Medical History:  Diagnosis Date  . Aneurysm (HCC)   . Anorexia   . Dysphagia   . Fibromyalgia   . Hashimoto thyroiditis   . Hypothyroidism   . Lupus   . Migraines   . Sjogren's disease [facility])    Past Surgical History:  Procedure Laterality Date  . ABDOMINAL HYSTERECTOMY    . CERVICAL FUSION  2004   C4-C5 with Instrumentation  . CERVICAL FUSION  2004   C4-C5  . cervical rods    . CHOLECYSTECTOMY    . kidney stone    . l5-s1 surgery    . LUMBAR MICRODISCECTOMY  2004   Bilateral L5-S1   Family History  Problem Relation Age of Onset  . Diabetes Sister   . Breast cancer Paternal Aunt    Social History   Socioeconomic History  . Marital status: Married    Spouse name: Not on file  . Number of children: Not on file  . Years of education: Not on file  . Highest education level: Not on file  Social Needs  . Financial resource strain: Not on file  . Food insecurity - worry: Not on file  . Food insecurity - inability: Not on file  . Transportation needs - medical: Not on file  . Transportation needs - non-medical: Not on file  Occupational History    . Not on file  Tobacco Use  . Smoking status: Never Smoker  . Smokeless tobacco: Never Used  Substance and Sexual Activity  . Alcohol use: Not on file  . Drug use: Not on file  . Sexual activity: Not on file  Other Topics Concern  . Not on file  Social History Narrative  . Not on file    Outpatient Encounter Medications as of 06/05/2017  Medication Sig  . atorvastatin (LIPITOR) 20 MG tablet TAKE 1 TABLET BY MOUTH DAILY  . baclofen (LIORESAL) 10 MG tablet Take 1 tablet (10 mg total) 3 (three) times daily by mouth.  . DULoxetine (CYMBALTA) 60 MG capsule   . HYDROcodone-acetaminophen (NORCO/VICODIN) 5-325 MG per tablet Take 1-2 tablets by mouth every 6 (six) hours as needed.  Marland Kitchen levothyroxine (SYNTHROID, LEVOTHROID) 75 MCG tablet Take 75 mcg by mouth daily.    . meloxicam (MOBIC) 15 MG tablet Take 15 mg by mouth daily as needed for pain.  . predniSONE (DELTASONE) 5 MG tablet Take 5 mg by mouth daily with breakfast.  . sulfaSALAzine (AZULFIDINE) 500 MG tablet    No facility-administered encounter medications on file as of  06/05/2017.     Activities of Daily Living No flowsheet data found.  Patient Care Team: Sheliah Hatch, MD as PCP - General    Assessment:   This is a routine wellness examination for [place].  Exercise Activities and Dietary recommendations   Diet (meal preparation, eat out, water intake, caffeinated beverages, dairy products, fruits and vegetables):   Breakfast: Lunch:  Dinner:      Goals    None      Fall Risk Fall Risk  02/13/2016 04/01/2014 04/01/2014  Falls in the past year? No Yes Yes  Number falls in past yr: - 1 1  Comment - foot slipped on step. -  Injury with Fall? - No -  Comment - last 2-3 years can't remember falling. -  Risk for fall due to : - - Other (Comment)    Depression Screen PHQ 2/9 Scores 01/27/2017 02/13/2016 04/01/2014 04/01/2014  PHQ - 2 Score 0 0 0 0  PHQ- 9 Score 0 - - -     Cognitive Function         Immunization History  Administered Date(s) Administered  . Influenza, Seasonal, Injecte, Preservative Fre 07/04/2016  . Influenza,inj,Quad PF,6+ Mos 04/22/2013, 04/01/2014, 01/27/2017     Screening Tests Health Maintenance  Topic Date Due  . Hepatitis C Screening  12-30-1955  . HIV Screening  12/23/1968  . TETANUS/TDAP  12/23/1972  . MAMMOGRAM  12/24/2003  . COLONOSCOPY  06/17/2018  . INFLUENZA VACCINE  Completed        Plan:     I have personally reviewed and noted the following in the patient's chart:   . Medical and social history . Use of alcohol, tobacco or illicit drugs  . Current medications and supplements . Functional ability and status . Nutritional status . Physical activity . Advanced directives . List of other physicians . Hospitalizations, surgeries, and ER visits in previous 12 months . Vitals . Screenings to include cognitive, depression, and falls . Referrals and appointments  In addition, I have reviewed and discussed with patient certain preventive protocols, quality metrics, and best practice recommendations. A written personalized care plan for preventive services as well as general preventive health recommendations were provided to patient.     Alysia Penna, RN  06/04/2017

## 2017-06-05 ENCOUNTER — Encounter: Payer: Medicare Other | Admitting: Family Medicine

## 2017-06-05 ENCOUNTER — Ambulatory Visit: Payer: Medicare Other

## 2017-07-15 ENCOUNTER — Encounter: Payer: Self-pay | Admitting: Family Medicine

## 2017-07-16 ENCOUNTER — Other Ambulatory Visit: Payer: Self-pay | Admitting: Family Medicine

## 2017-07-16 DIAGNOSIS — G43809 Other migraine, not intractable, without status migrainosus: Secondary | ICD-10-CM

## 2017-10-07 NOTE — Progress Notes (Addendum)
Subjective:   Becky Boyd is a 62 y.o. female who presents for Medicare Annual (Subsequent) preventive examination.  Review of Systems:  No ROS.  Medicare Wellness Visit. Additional risk factors are reflected in the social history.  Cardiac Risk Factors include: family history of premature cardiovascular disease   Sleep patterns: Sleeps 5 hours, interrupted frequently.   Home Safety/Smoke Alarms: Feels safe in home. Smoke alarms in place.  Living environment; residence and Firearm Safety: Lives with husband in 2.5 story.  Seat Belt Safety/Bike Helmet: Wears seat belt.   Female:   Pap-2014       Mammo-Pt reports > 5 years. Declines further testing.        Dexa scan-Pt reports > 5 years. Pt reports normal.         CCS-Colonoscopy 07/09/2004, normal. Recall 10 years. Cologuard ordered.      Objective:     Vitals: BP 126/78 (BP Location: Left Arm, Patient Position: Sitting, Cuff Size: Normal)   Temp 98.1 F (36.7 C) (Temporal)   Resp 18   Ht 5\' 3"  (1.6 m)   Wt 149 lb 4 oz (67.7 kg)   SpO2 96%   BMI 26.44 kg/m   Body mass index is 26.44 kg/m.  Advanced Directives 10/08/2017  Does Patient Have a Medical Advance Directive? Yes  Type of 12/08/2017 of [place];Living will  Copy of Healthcare Power of Attorney in Chart? No - copy requested    Tobacco Social History   Tobacco Use  Smoking Status Never Smoker  Smokeless Tobacco Never Used     Counseling given: Not Answered    Past Medical History:  Diagnosis Date  . Aneurysm (HCC)   . Anorexia   . Dysphagia   . Fibromyalgia   . Hashimoto thyroiditis   . Hypothyroidism   . Lupus (HCC)   . Migraines   . Sjogren's disease [facility])    Past Surgical History:  Procedure Laterality Date  . ABDOMINAL HYSTERECTOMY    . CERVICAL FUSION  2004   C4-C5 with Instrumentation  . CERVICAL FUSION  2004   C4-C5  . cervical rods    . CHOLECYSTECTOMY    . kidney stone    . l5-s1 surgery    . LUMBAR  MICRODISCECTOMY  2004   Bilateral L5-S1   Family History  Problem Relation Age of Onset  . Diabetes Sister   . Breast cancer Paternal Aunt   . COPD Mother   . Heart disease Father    Social History   Socioeconomic History  . Marital status: Married    Spouse name: Not on file  . Number of children: Not on file  . Years of education: Not on file  . Highest education level: Not on file  Occupational History  . Not on file  Social Needs  . Financial resource strain: Not on file  . Food insecurity:    Worry: Not on file    Inability: Not on file  . Transportation needs:    Medical: Not on file    Non-medical: Not on file  Tobacco Use  . Smoking status: Never Smoker  . Smokeless tobacco: Never Used  Substance and Sexual Activity  . Alcohol use: Not Currently  . Drug use: Not Currently  . Sexual activity: Not on file  Lifestyle  . Physical activity:    Days per week: Not on file    Minutes per session: Not on file  . Stress: Not on file  Relationships  .  Social connections:    Talks on phone: Not on file    Gets together: Not on file    Attends religious service: Not on file    Active member of club or organization: Not on file    Attends meetings of clubs or organizations: Not on file    Relationship status: Not on file  Other Topics Concern  . Not on file  Social History Narrative  . Not on file    Outpatient Encounter Medications as of 10/08/2017  Medication Sig  . atorvastatin (LIPITOR) 20 MG tablet TAKE 1 TABLET BY MOUTH DAILY  . baclofen (LIORESAL) 10 MG tablet Take 1 tablet (10 mg total) 3 (three) times daily by mouth.  . DULoxetine (CYMBALTA) 60 MG capsule   . HYDROcodone-acetaminophen (NORCO/VICODIN) 5-325 MG per tablet Take 1-2 tablets by mouth every 6 (six) hours as needed.  Marland Kitchen levothyroxine (SYNTHROID, LEVOTHROID) 75 MCG tablet Take 75 mcg by mouth daily.    . meloxicam (MOBIC) 15 MG tablet Take 15 mg by mouth daily as needed for pain (takes daily).     . predniSONE (DELTASONE) 5 MG tablet Take 5 mg by mouth daily with breakfast.  . PREVIDENT 5000 DRY MOUTH 1.1 % GEL dental gel   . sulfaSALAzine (AZULFIDINE) 500 MG tablet 1,000 mg 2 (two) times daily.    No facility-administered encounter medications on file as of 10/08/2017.     Activities of Daily Living In your present state of health, do you have any difficulty performing the following activities: 10/08/2017  Hearing? N  Vision? N  Difficulty concentrating or making decisions? Y  Walking or climbing stairs? N  Comment pain in right hip and back  Dressing or bathing? N  Doing errands, shopping? N  Preparing Food and eating ? N  Using the Toilet? N  In the past six months, have you accidently leaked urine? N  Do you have problems with loss of bowel control? N  Managing your Medications? N  Managing your Finances? N  Housekeeping or managing your Housekeeping? N  Some recent data might be hidden    Patient Care Team: Sheliah Hatch, MD as PCP - General Dierdre Forth Rhona Leavens, MD as Consulting Physician (Rheumatology) Anson Fret, MD as Consulting Physician (Neurology)    Assessment:   This is a routine wellness examination for Becky Boyd.  Exercise Activities and Dietary recommendations Current Exercise Habits: The patient does not participate in regular exercise at present(maintains household), Exercise limited by: orthopedic condition(s)   Diet (meal preparation, eat out, water intake, caffeinated beverages, dairy products, fruits and vegetables): Drinks water, Pepsi and juices.   Breakfast: cereal Lunch: skips Dinner: protein and vegetables.    Pt reports weight gain d/t not being able to walk d/t back and hip pain.  Suggested water aerobics, pt has fear of water.   Goals    . Increase physical activity     Increase activity by walking more once back/hip pain is resolved.        Fall Risk Fall Risk  10/08/2017 02/13/2016 04/01/2014 04/01/2014  Falls in the past  year? No No Yes Yes  Number falls in past yr: - - 1 1  Comment - - foot slipped on step. -  Injury with Fall? - - No -  Comment - - last 2-3 years can't remember falling. -  Risk for fall due to : - - - Other (Comment)    Depression Screen PHQ 2/9 Scores 10/08/2017 01/27/2017 02/13/2016 04/01/2014  PHQ - 2 Score 0 0 0 0  PHQ- 9 Score - 0 - -     Cognitive Function MMSE - Mini Mental State Exam 10/08/2017  Orientation to time 4  Orientation to Place 5  Registration 3  Attention/ Calculation 5  Recall 3  Language- name 2 objects 2  Language- repeat 1  Language- follow 3 step command 3  Language- read & follow direction 1  Write a sentence 1  Copy design 1  Total score 29        Immunization History  Administered Date(s) Administered  . Influenza, Seasonal, Injecte, Preservative Fre 07/04/2016  . Influenza,inj,Quad PF,6+ Mos 04/22/2013, 04/01/2014, 01/27/2017    Screening Tests Health Maintenance  Topic Date Due  . MAMMOGRAM  10/09/2018 (Originally 12/24/2003)  . TETANUS/TDAP  10/09/2018 (Originally 12/23/1972)  . Hepatitis C Screening  10/09/2018 (Originally 1955/12/08)  . HIV Screening  10/09/2018 (Originally 12/23/1968)  . INFLUENZA VACCINE  01/01/2018  . COLONOSCOPY  06/17/2018        Plan:    Bring a copy of your living will and/or healthcare power of attorney to your next office visit.  Continue doing brain stimulating activities (puzzles, reading, adult coloring books, staying active) to keep memory sharp.   I have personally reviewed and noted the following in the patient's chart:   . Medical and social history . Use of alcohol, tobacco or illicit drugs  . Current medications and supplements . Functional ability and status . Nutritional status . Physical activity . Advanced directives . List of other physicians . Hospitalizations, surgeries, and ER visits in previous 12 months . Vitals . Screenings to include cognitive, depression, and  falls . Referrals and appointments  In addition, I have reviewed and discussed with patient certain preventive protocols, quality metrics, and best practice recommendations. A written personalized care plan for preventive services as well as general preventive health recommendations were provided to patient.     Alysia Penna, RN  10/08/2017  Reviewed documentation provided by RN and agree w/ above.  Neena Rhymes, MD

## 2017-10-08 ENCOUNTER — Ambulatory Visit (INDEPENDENT_AMBULATORY_CARE_PROVIDER_SITE_OTHER): Payer: Medicare Other | Admitting: Family Medicine

## 2017-10-08 ENCOUNTER — Ambulatory Visit (INDEPENDENT_AMBULATORY_CARE_PROVIDER_SITE_OTHER): Payer: Medicare Other

## 2017-10-08 ENCOUNTER — Encounter: Payer: Self-pay | Admitting: Family Medicine

## 2017-10-08 ENCOUNTER — Other Ambulatory Visit: Payer: Self-pay

## 2017-10-08 VITALS — BP 126/78 | Temp 98.1°F | Resp 18 | Ht 63.0 in | Wt 149.2 lb

## 2017-10-08 VITALS — BP 126/78 | HR 79 | Temp 98.1°F | Resp 18 | Ht 63.0 in | Wt 149.2 lb

## 2017-10-08 DIAGNOSIS — M3500 Sicca syndrome, unspecified: Secondary | ICD-10-CM | POA: Diagnosis not present

## 2017-10-08 DIAGNOSIS — Z0000 Encounter for general adult medical examination without abnormal findings: Secondary | ICD-10-CM | POA: Diagnosis not present

## 2017-10-08 DIAGNOSIS — Z1211 Encounter for screening for malignant neoplasm of colon: Secondary | ICD-10-CM | POA: Diagnosis not present

## 2017-10-08 DIAGNOSIS — E785 Hyperlipidemia, unspecified: Secondary | ICD-10-CM

## 2017-10-08 LAB — CBC WITH DIFFERENTIAL/PLATELET
BASOS PCT: 0.6 % (ref 0.0–3.0)
Basophils Absolute: 0 10*3/uL (ref 0.0–0.1)
EOS PCT: 1.1 % (ref 0.0–5.0)
Eosinophils Absolute: 0.1 10*3/uL (ref 0.0–0.7)
HEMATOCRIT: 38 % (ref 36.0–46.0)
Hemoglobin: 12.5 g/dL (ref 12.0–15.0)
LYMPHS PCT: 13.2 % (ref 12.0–46.0)
Lymphs Abs: 0.9 10*3/uL (ref 0.7–4.0)
MCHC: 33 g/dL (ref 30.0–36.0)
MCV: 98.8 fl (ref 78.0–100.0)
MONOS PCT: 4 % (ref 3.0–12.0)
Monocytes Absolute: 0.3 10*3/uL (ref 0.1–1.0)
NEUTROS ABS: 5.7 10*3/uL (ref 1.4–7.7)
Neutrophils Relative %: 81.1 % — ABNORMAL HIGH (ref 43.0–77.0)
PLATELETS: 239 10*3/uL (ref 150.0–400.0)
RBC: 3.85 Mil/uL — ABNORMAL LOW (ref 3.87–5.11)
RDW: 13.5 % (ref 11.5–15.5)
WBC: 7 10*3/uL (ref 4.0–10.5)

## 2017-10-08 LAB — LIPID PANEL
CHOL/HDL RATIO: 3
Cholesterol: 173 mg/dL (ref 0–200)
HDL: 62.2 mg/dL (ref >39.00)
LDL Cholesterol: 89 mg/dL (ref 0–99)
NonHDL: 110.42
Triglycerides: 107 mg/dL (ref 0.0–149.0)
VLDL: 21.4 mg/dL (ref 0.0–40.0)

## 2017-10-08 LAB — BASIC METABOLIC PANEL
BUN: 19 mg/dL (ref 6–23)
CALCIUM: 9.6 mg/dL (ref 8.4–10.5)
CO2: 25 mEq/L (ref 19–32)
Chloride: 104 mEq/L (ref 96–112)
Creatinine, Ser: 0.87 mg/dL (ref 0.40–1.20)
GFR: 69.72 mL/min (ref >60.00)
Glucose, Bld: 93 mg/dL (ref 70–99)
POTASSIUM: 3.7 meq/L (ref 3.5–5.1)
SODIUM: 140 meq/L (ref 135–145)

## 2017-10-08 LAB — TSH: TSH: 1.76 u[IU]/mL (ref 0.35–4.50)

## 2017-10-08 LAB — HEPATIC FUNCTION PANEL
ALT: 13 U/L (ref 0–35)
AST: 18 U/L (ref 0–37)
Albumin: 4.4 g/dL (ref 3.5–5.2)
Alkaline Phosphatase: 98 U/L (ref 39–117)
BILIRUBIN DIRECT: 0.1 mg/dL (ref 0.0–0.3)
BILIRUBIN TOTAL: 0.5 mg/dL (ref 0.2–1.2)
Total Protein: 7.2 g/dL (ref 6.0–8.3)

## 2017-10-08 NOTE — Patient Instructions (Addendum)

## 2017-10-08 NOTE — Assessment & Plan Note (Signed)
Chronic problem.  Tolerating statin w/o difficulty.  Check labs.  Adjust meds prn  

## 2017-10-08 NOTE — Assessment & Plan Note (Signed)
Following w/ Dr Beekman 

## 2017-10-08 NOTE — Progress Notes (Signed)
   Subjective:    Patient ID: Becky Boyd, female    DOB: 09-01-1955, 62 y.o.   MRN: [id number]  HPI CPE- due for colon cancer screen (cologuard ordered).  Due for mammo- pt declines (pt reports due to implants she has been told that breasts are 'too dense to see anything'- even w/ MRI)  UTD on immunizations.  appt w/ Dr Lucia Gaskins upcoming.  Review of Systems Patient reports no vision/ hearing changes, adenopathy,fever, weight change,  persistant/recurrent hoarseness, chest pain, palpitations, edema, persistant/recurrent cough, hemoptysis, dyspnea (rest/exertional/paroxysmal nocturnal), gastrointestinal bleeding (melena, rectal bleeding), abdominal pain, significant heartburn, bowel changes, GU symptoms (dysuria, hematuria, incontinence), Gyn symptoms (abnormal  bleeding, pain),  syncope, focal weakness, memory loss, numbness & tingling, skin/hair/nail changes, abnormal bruising or bleeding, anxiety, or depression.   + dysphagia- pt refuses referral    Objective:   Physical Exam General Appearance:    Alert, cooperative, no distress, appears stated age  Head:    Normocephalic, without obvious abnormality, atraumatic  Eyes:    PERRL, conjunctiva/corneas clear, EOM's intact, fundi    benign, both eyes  Ears:    Normal TM's and external ear canals, both ears  Nose:   Nares normal, septum midline, mucosa normal, no drainage    or sinus tenderness  Throat:   Lips, mucosa, and tongue normal; teeth and gums normal  Neck:   Supple, symmetrical, trachea midline, no adenopathy;    Thyroid: no enlargement/tenderness/nodules  Back:     Symmetric, no curvature, ROM normal, no CVA tenderness  Lungs:     Clear to auscultation bilaterally, respirations unlabored  Chest Wall:    No tenderness or deformity   Heart:    Regular rate and rhythm, S1 and S2 normal, no murmur, rub   or gallop  Breast Exam:    Deferred at pt's request  Abdomen:     Soft, non-tender, bowel sounds active all four quadrants,    no  masses, no organomegaly  Genitalia:    Deferred at pt's request  Rectal:    Extremities:   Extremities normal, atraumatic, no cyanosis or edema  Pulses:   2+ and symmetric all extremities  Skin:   Skin color, texture, turgor normal, no rashes or lesions  Lymph nodes:   Cervical, supraclavicular, and axillary nodes normal  Neurologic:   CNII-XII intact, normal strength, sensation and reflexes    throughout          Assessment & Plan:

## 2017-10-08 NOTE — Assessment & Plan Note (Signed)
Pt's PE WNL.  Due for colon cancer screen- declines colonoscopy, will complete cologuard.  Refusing mammo.  Check labs.  Anticipatory guidance provided.

## 2017-10-08 NOTE — Patient Instructions (Signed)
Follow up in 6 months to recheck cholesterol We'll notify you of your lab results and make any changes if needed Keep up the good work on healthy diet and regular exercise- you can do it!!! Complete the cologuard as directed Call with any questions or concerns Happy Mother's Day!!!

## 2017-10-09 ENCOUNTER — Other Ambulatory Visit: Payer: Self-pay | Admitting: Family Medicine

## 2017-10-09 ENCOUNTER — Encounter: Payer: Self-pay | Admitting: General Practice

## 2017-10-15 ENCOUNTER — Ambulatory Visit: Payer: Medicare Other | Admitting: Neurology

## 2017-10-15 ENCOUNTER — Encounter: Payer: Self-pay | Admitting: Neurology

## 2017-10-15 VITALS — BP 114/79 | HR 83 | Ht 63.0 in | Wt 148.0 lb

## 2017-10-15 DIAGNOSIS — G939 Disorder of brain, unspecified: Secondary | ICD-10-CM | POA: Diagnosis not present

## 2017-10-15 NOTE — Progress Notes (Signed)
[facility]    Provider:  Dr Lucia Gaskins Referring Provider: Sheliah Hatch, MD Primary Care Physician:  Sheliah Hatch, MD  CC:  Headaches  HPI:  Becky Boyd is a 62 y.o. female here as a referral from Dr. Beverely Low for headaches. But she says she is not sure why she is here. She used to see Dr. Vela Prose for headaches which are improved. Every few years he would send her for MRI of her brain bc of white matter changes, we reviewed the images of her brain together and discussed chronic white matter changes. . She has dizziness. She has pain in her legs. Her lower back hurts, she has 12-13/10 in pain, it hurts across the lower back and into the lower right buttocks. Pain is worsening. The calves in her legs really hurt. If she walk far she has to stop pr bend over and then continue. Her memory is poor but neuropsych testing in the past was unremarkable. She can't afford physical therapy. She has a lot of pain. However she declines any treatment or any testing, she says her headaches are not that bad or that often, she sees her doctor for pain and just wanted to ask me some questions. I encouraged her to return if symptoms worsen. She says she has a lot of chronic problems but nothing acute or new or needing attention per patient.   Reviewed notes, labs and imaging from outside physicians, which showed:    Reviewed MRi images and agree with the following: Progressive, moderate cerebral white matter and brainstem signal changes from 2015, nonspecific. This may reflect demyelinating disease, chronic small vessel ischemia, or vasculitis among others.   TSH, cbc, cmp normal.   LP MS panel was negative for MS 2015  Review of Systems: Patient complains of symptoms per HPI as well as the following symptoms: back pain, joint pain, headaches. Pertinent negatives and positives per HPI. All others negative.   Social History   Socioeconomic History  . Marital status: Married   Spouse name: Not on file  . Number of children: 2  . Years of education: Not on file  . Highest education level: High school graduate  Occupational History  . Not on file  Social Needs  . Financial resource strain: Not on file  . Food insecurity:    Worry: Not on file    Inability: Not on file  . Transportation needs:    Medical: Not on file    Non-medical: Not on file  Tobacco Use  . Smoking status: Never Smoker  . Smokeless tobacco: Never Used  Substance and Sexual Activity  . Alcohol use: Never    Frequency: Never  . Drug use: Never  . Sexual activity: Not on file  Lifestyle  . Physical activity:    Days per week: Not on file    Minutes per session: Not on file  . Stress: Not on file  Relationships  . Social connections:    Talks on phone: Not on file    Gets together: Not on file    Attends religious service: Not on file    Active member of club or organization: Not on file    Attends meetings of clubs or organizations: Not on file    Relationship status: Not on file  . Intimate partner violence:    Fear of current or ex partner: Not on file    Emotionally abused: Not on file    Physically abused: Not on file  Forced sexual activity: Not on file  Other Topics Concern  . Not on file  Social History Narrative   Lives at home with her husband   Right handed   Caffeine: occasional pepsi or mtn dew    Family History  Problem Relation Age of Onset  . Diabetes Sister   . Breast cancer Paternal Aunt   . COPD Mother   . Heart disease Father   . COPD Brother   . Aneurysm Paternal Aunt   . Diabetes Other     Past Medical History:  Diagnosis Date  . Aneurysm (HCC)   . Anorexia   . Dysphagia   . Fibromyalgia   . Fibromyalgia   . Hashimoto thyroiditis   . Hypothyroidism   . Kidney stones   . Lupus (HCC)   . Migraines   . Rheumatoid arthritis (HCC)   . Sjogren's disease [facility])     Past Surgical History:  Procedure Laterality Date  . ABDOMINAL  HYSTERECTOMY    . CERVICAL FUSION  2004   C4-C5 with Instrumentation  . CERVICAL FUSION  1990   C3-C4  . cervical rods    . CHOLECYSTECTOMY    . kidney stone    . l5-s1 surgery    . LUMBAR MICRODISCECTOMY  2004   Bilateral L5-S1    Current Outpatient Medications  Medication Sig Dispense Refill  . atorvastatin (LIPITOR) 20 MG tablet TAKE ONE TABLET BY MOUTH ONE TIME DAILY  90 tablet 1  . baclofen (LIORESAL) 10 MG tablet Take 1 tablet (10 mg total) 3 (three) times daily by mouth. 90 each 3  . DULoxetine (CYMBALTA) 60 MG capsule Take 60 mg by mouth daily.     Marland Kitchen HYDROcodone-acetaminophen (NORCO/VICODIN) 5-325 MG per tablet Take 1-2 tablets by mouth every 6 (six) hours as needed. (Patient taking differently: Take 1-2 tablets by mouth every 6 (six) hours as needed (generalized pain). ) 20 tablet 0  . levothyroxine (SYNTHROID, LEVOTHROID) 75 MCG tablet Take 75 mcg by mouth daily.      . meloxicam (MOBIC) 15 MG tablet Take 15 mg by mouth daily as needed for pain (takes daily).     . predniSONE (DELTASONE) 5 MG tablet Take 5 mg by mouth daily with breakfast.    . PREVIDENT 5000 DRY MOUTH 1.1 % GEL dental gel     . sulfaSALAzine (AZULFIDINE) 500 MG tablet 1,000 mg 2 (two) times daily.      No current facility-administered medications for this visit.     Allergies as of 10/15/2017 - Review Complete 10/15/2017  Allergen Reaction Noted  . Plaquenil [hydroxychloroquine sulfate]  09/30/2012    Vitals: BP 114/79 (BP Location: Right Arm, Patient Position: Sitting)   Pulse 83   Ht 5\' 3"  (1.6 m)   Wt 148 lb (67.1 kg)   BMI 26.22 kg/m  Last Weight:  Wt Readings from Last 1 Encounters:  10/15/17 148 lb (67.1 kg)   Last Height:   Ht Readings from Last 1 Encounters:  10/15/17 5\' 3"  (1.6 m)    Physical exam: Exam: Gen: NAD, conversant               CV: RRR, no MRG. No Carotid Bruits. No peripheral edema, warm, nontender Eyes: Conjunctivae clear without exudates or  hemorrhage  Neuro: Detailed Neurologic Exam  Speech:    Speech is normal; fluent and spontaneous with normal comprehension.  Cognition:    The patient is oriented to person, place, and time;  recent and remote memory intact;     language fluent;     normal attention, concentration,     fund of knowledge Cranial Nerves:    The pupils are equal, round, and reactive to light. Attempted fundoscopic exam could not visualize due to small pupils. . Visual fields are full to finger confrontation. Extraocular movements are intact. Trigeminal sensation is intact and the muscles of mastication are normal. The face is symmetric. The palate elevates in the midline. Hearing intact. Voice is normal. Shoulder shrug is normal. The tongue has normal motion without fasciculations.   Coordination:    Normal finger to nose   Gait:    Not ataxic  Motor Observation:    No asymmetry, no atrophy, and no involuntary movements noted. Tone:    Normal muscle tone.    Posture:    Posture is normal. normal erect    Strength:    Strength is symmetrical  in the upper and lower limbs.      Sensation: intact to LT     Reflex Exam:  DTR's:    Deep tendon reflexes in the upper and lower extremities are symmetrical bilaterally.   Toes:    The toes are equiv bilaterally.   Clonus:    Clonus is absent.      Assessment/Plan:  Patient is here or headaches which she says are improved. We reviewed the last mri of her brain which showed moderate cerebral white matter and brain stem changes, discussed causes, recommended repeating MRI brain and patient declines. Provided education and documentation for these changes. She had extensive workup and LP and MS was never diagnosed. She needs close management by pcp for vascular risk factors. Given her microvascular disease, asa 72 for stroke prevention.   Discussed: To prevent or relieve headaches, try the following: Cool Compress. Lie down and place a cool  compress on your head.  Avoid headache triggers. If certain foods or odors seem to have triggered your migraines in the past, avoid them. A headache diary might help you identify triggers.  Include physical activity in your daily routine. Try a daily walk or other moderate aerobic exercise.  Manage stress. Find healthy ways to cope with the stressors, such as delegating tasks on your to-do list.  Practice relaxation techniques. Try deep breathing, yoga, massage and visualization.  Eat regularly. Eating regularly scheduled meals and maintaining a healthy diet might help prevent headaches. Also, drink plenty of fluids.  Follow a regular sleep schedule. Sleep deprivation might contribute to headaches Consider biofeedback. With this mind-body technique, you learn to control certain bodily functions - such as muscle tension, heart rate and blood pressure - to prevent headaches or reduce headache pain.    Proceed to emergency room if you experience new or worsening symptoms or symptoms do not resolve, if you have new neurologic symptoms or if headache is severe, or for any concerning symptom.   Cc: Dr. Lindon Romp, MD  [facility] [address] [place], [place] [address]  Phone [phone] Fax [phone]

## 2017-10-17 ENCOUNTER — Encounter: Payer: Self-pay | Admitting: Neurology

## 2017-11-03 ENCOUNTER — Encounter: Payer: Self-pay | Admitting: Neurology

## 2017-11-21 ENCOUNTER — Ambulatory Visit: Payer: Self-pay | Admitting: Family Medicine

## 2017-11-21 ENCOUNTER — Encounter: Payer: Self-pay | Admitting: Family Medicine

## 2017-11-21 ENCOUNTER — Other Ambulatory Visit: Payer: Self-pay

## 2017-11-21 ENCOUNTER — Ambulatory Visit (INDEPENDENT_AMBULATORY_CARE_PROVIDER_SITE_OTHER): Payer: Medicare Other | Admitting: Family Medicine

## 2017-11-21 VITALS — BP 118/80 | HR 93 | Temp 98.6°F | Resp 16 | Ht 63.0 in | Wt 149.4 lb

## 2017-11-21 DIAGNOSIS — S51851A Open bite of right forearm, initial encounter: Secondary | ICD-10-CM

## 2017-11-21 DIAGNOSIS — W5501XA Bitten by cat, initial encounter: Secondary | ICD-10-CM

## 2017-11-21 MED ORDER — AMOXICILLIN-POT CLAVULANATE 875-125 MG PO TABS: 1.0000 | ORAL_TABLET | Freq: Two times a day (BID) | ORAL | 0 refills | Status: DC

## 2017-11-21 NOTE — Progress Notes (Signed)
Subjective  CC:  Chief Complaint  Patient presents with  . Animal Bite    Patient stated that she was helping her cat up the steps and he spooked and accidentally bit her, she stated it was her fault    HPI: Becky Boyd is a 62 y.o. female who presents to the office today to address the problems listed above in the chief complaint.  Pt has elderly cat that can no longer go up and down the stairs; was carrying him and got bit on right forearm. Bite was last night: now with redness, mild swelling and soreness; no fevers or pus. No hand pain. Cat's vaccinations are up to date.   Assessment  1. Cat bite, initial encounter      Plan   Cat bite, infected, right forearm:  Wound care and augmenting bid x 7d, advil, tylenol for soreness. F/u if not improving.   Follow up: Return if symptoms worsen or fail to improve.   No orders of the defined types were placed in this encounter.  Meds ordered this encounter  Medications  . amoxicillin-clavulanate (AUGMENTIN) 875-125 MG tablet    Sig: Take 1 tablet by mouth 2 (two) times daily.    Dispense:  14 tablet    Refill:  0     I reviewed the patients updated PMH, FH, and SocHx.    Patient Active Problem List   Diagnosis Date Noted  . Hyperlipidemia 01/27/2017  . Bell's palsy 02/13/2016  . Maxillary sinusitis, acute 09/07/2015  . Hematuria 03/02/2015  . Left lumbar radiculopathy 11/09/2014  . Shortness of breath 11/09/2014  . Jaw pain 04/13/2014  . Pain in joint, shoulder region 10/21/2013  . Chest pain 08/31/2013  . Post-nasal drip 08/31/2013  . Abnormal brain MRI 06/17/2013  . Sjogren's syndrome (HCC) 06/17/2013  . Blurry vision 06/17/2013  . Back pain, lumbosacral 09/30/2012  . Trigger point with back pain 03/22/2011  . General medical examination 01/25/2011  . Memory loss 01/25/2011  . Joint pain 11/08/2010  . Fatigue 11/08/2010  . HYPOTHYROIDISM 05/02/2008  . HASHIMOTO'S THYROIDITIS 05/02/2008  . ANOREXIA 05/02/2008  .  DYSPHAGIA UNSPECIFIED 05/02/2008  . ABDOMINAL PAIN 05/02/2008   Current Meds  Medication Sig  . atorvastatin (LIPITOR) 20 MG tablet TAKE ONE TABLET BY MOUTH ONE TIME DAILY   . baclofen (LIORESAL) 10 MG tablet Take 1 tablet (10 mg total) 3 (three) times daily by mouth.  . DULoxetine (CYMBALTA) 60 MG capsule Take 60 mg by mouth daily.   Marland Kitchen HYDROcodone-acetaminophen (NORCO/VICODIN) 5-325 MG per tablet Take 1-2 tablets by mouth every 6 (six) hours as needed. (Patient taking differently: Take 1-2 tablets by mouth every 6 (six) hours as needed (generalized pain). )  . levothyroxine (SYNTHROID, LEVOTHROID) 75 MCG tablet Take 75 mcg by mouth daily.    . meloxicam (MOBIC) 15 MG tablet Take 15 mg by mouth daily as needed for pain (takes daily).   . predniSONE (DELTASONE) 5 MG tablet Take 5 mg by mouth daily with breakfast.  . PREVIDENT 5000 DRY MOUTH 1.1 % GEL dental gel   . sulfaSALAzine (AZULFIDINE) 500 MG tablet 1,000 mg 2 (two) times daily.     Allergies: Patient is allergic to plaquenil [hydroxychloroquine sulfate]. Family History: Patient family history includes Aneurysm in her paternal aunt; Breast cancer in her paternal aunt; COPD in her brother and mother; Diabetes in her other and sister; Heart disease in her father. Social History:  Patient  reports that she has never smoked. She  has never used smokeless tobacco. She reports that she does not drink alcohol or use drugs.  Review of Systems: Constitutional: Negative for fever malaise or anorexia Cardiovascular: negative for chest pain Respiratory: negative for SOB or persistent cough Gastrointestinal: negative for abdominal pain  Objective  Vitals: BP 118/80   Pulse 93   Temp 98.6 F (37 C) (Oral)   Resp 16   Ht 5\' 3"  (1.6 m)   Wt 149 lb 6.4 oz (67.8 kg)   SpO2 96%   BMI 26.47 kg/m  General: no acute distress , A&Ox3 Skin:  Warm, no rashes Right forearm: cat bite, closed now w/o drainage, + erythema, ttp w/o fluctuance, w/o  heat, nl use of hand     Commons side effects, risks, benefits, and alternatives for medications and treatment plan prescribed today were discussed, and the patient expressed understanding of the given instructions. Patient is instructed to call or message via MyChart if he/she has any questions or concerns regarding our treatment plan. No barriers to understanding were identified. We discussed Red Flag symptoms and signs in detail. Patient expressed understanding regarding what to do in case of urgent or emergency type symptoms.   Medication list was reconciled, printed and provided to the patient in AVS. Patient instructions and summary information was reviewed with the patient as documented in the AVS. This note was prepared with assistance of Dragon voice recognition software. Occasional wrong-word or sound-a-like substitutions may have occurred due to the inherent limitations of voice recognition software

## 2017-11-21 NOTE — Telephone Encounter (Signed)
Pt calling to get appt for cat bite to the right wrist. Pt states she washed it and used hydrogen peroxide and neosporin. Site is "slightly" red with mild swelling. The bite occurred last night at 9:30 pm. Pt does not know when she had her last tetanus shot. Pt was carrying her pet cat upstairs and per the pt she did not have a good hold on the cat and the cat became afraid and then the cat bit her. The cat has difficulty walking up stairs.  Care advice given. Disposition is to see physician within 4 hours. Appt made for today with Dr Mardelle Matte (PCP has no availability) for today at 2:15 pm. Reason for Disposition . [1] Puncture wound (hole through the skin) from claws or teeth AND [2] cat  Answer Assessment - Initial Assessment Questions 1. ANIMAL: "What type of animal caused the bite?" "Is the injury from a bite or a claw?" If the animal is a dog or a cat, ask: "Was it a pet or a stray?" "Was it acting ill or behaving strangely?"     Cat-bite-pet-cat didn't want to be picked up and is afraid of heights and reacted 2. LOCATION: "Where is the bite located?"      Right wrist (the back of the wrist) 3. SIZE: "How big is the bite?" "What does it look like?"      1/2 to 3/4 inch- site looks slightly red and swollen 4. ONSET: "When did the bite happen?" (Minutes or hours ago)      Last night 9:30 pm  5. CIRCUMSTANCES: "Tell me how this happened."      40 year old cat with difficulty walking up stairs and pt lifted cat to carry upstars and cat bit her 6. TETANUS: "When was the last tetanus booster?" unknown 7. PREGNANCY: "Is there any chance you are pregnant?" "When was your last menstrual period?"     n/a  Protocols used: ANIMAL BITE-A-AH

## 2017-11-21 NOTE — Patient Instructions (Signed)
Please follow up if symptoms do not improve or as needed.   Animal Bite Animal bite wounds can get infected. It is important to get proper medical treatment. Ask your doctor if you need rabies treatment. Follow these instructions at home: Wound care  Follow instructions from your doctor about how to take care of your wound. Make sure you: ? Wash your hands with soap and water before you change your bandage (dressing). If you cannot use soap and water, use hand sanitizer. ? Change your bandage as told by your doctor. ? Leave stitches (sutures), skin glue, or skin tape (adhesive) strips in place. They may need to stay in place for 2 weeks or longer. If tape strips get loose and curl up, you may trim the loose edges. Do not remove tape strips completely unless your doctor says it is okay.  Check your wound every day for signs of infection. Watch for: ? Redness, swelling, or pain that gets worse. ? Fluid, blood, or pus. General instructions  Take or apply over-the-counter and prescription medicines only as told by your doctor.  If you were prescribed an antibiotic, take or apply it as told by your doctor. Do not stop using the antibiotic even if your condition improves.  Keep the injured area raised (elevated) above the level of your heart while you are sitting or lying down.  If directed, apply ice to the injured area. ? Put ice in a plastic bag. ? Place a towel between your skin and the bag. ? Leave the ice on for 20 minutes, 2-3 times per day.  Keep all follow-up visits as told by your doctor. This is important. Contact a doctor if:  You have redness, swelling, or pain that gets worse.  You have a general feeling of sickness (malaise).  You feel sick to your stomach (nauseous).  You throw up (vomit).  You have pain that does not get better. Get help right away if:  You have a red streak going away from your wound.  You have fluid, blood, or pus coming from your  wound.  You have a fever or chills.  You have trouble moving your injured area.  You have numbness or tingling anywhere on your body. This information is not intended to replace advice given to you by your health care provider. Make sure you discuss any questions you have with your health care provider. Document Released: 05/20/2005 Document Revised: 10/26/2015 Document Reviewed: 10/05/2014 Elsevier Interactive Patient Education  2018 [employer].

## 2017-11-21 NOTE — Telephone Encounter (Signed)
FYI: Patient has an appointment at 2:15pm today.

## 2017-12-24 LAB — COLOGUARD: Cologuard: NEGATIVE

## 2017-12-25 ENCOUNTER — Other Ambulatory Visit: Payer: Self-pay

## 2017-12-25 DIAGNOSIS — Z1211 Encounter for screening for malignant neoplasm of colon: Secondary | ICD-10-CM

## 2018-03-12 ENCOUNTER — Other Ambulatory Visit: Payer: Self-pay | Admitting: Family Medicine

## 2018-03-13 NOTE — Telephone Encounter (Signed)
Last Filled: 04/07/17 #90, 3 Last OV: 10/08/17 Next appt has not been scheduled yet.

## 2018-03-30 ENCOUNTER — Encounter: Payer: Self-pay | Admitting: Family Medicine

## 2018-04-12 ENCOUNTER — Other Ambulatory Visit: Payer: Self-pay | Admitting: Family Medicine

## 2018-06-30 DIAGNOSIS — Z79899 Other long term (current) drug therapy: Secondary | ICD-10-CM | POA: Diagnosis not present

## 2018-06-30 DIAGNOSIS — M0579 Rheumatoid arthritis with rheumatoid factor of multiple sites without organ or systems involvement: Secondary | ICD-10-CM | POA: Diagnosis not present

## 2018-06-30 DIAGNOSIS — M359 Systemic involvement of connective tissue, unspecified: Secondary | ICD-10-CM | POA: Diagnosis not present

## 2018-07-07 ENCOUNTER — Other Ambulatory Visit: Payer: Self-pay | Admitting: Family Medicine

## 2018-09-02 ENCOUNTER — Other Ambulatory Visit: Payer: Self-pay | Admitting: Family Medicine

## 2018-09-14 ENCOUNTER — Telehealth: Payer: Self-pay | Admitting: *Deleted

## 2018-09-14 NOTE — Telephone Encounter (Signed)
Noted.  She is likely COVID19 + given that her husband is.  We will continue to monitor her at home.

## 2018-09-14 NOTE — Telephone Encounter (Signed)
Spoke with patient about current symptoms.   Husband has been admitted to [facility].   Currently she has a cough, fever of 100.3,   States that she does not have any SOB.  She is having muscle aches, ears hurt, she is exhausted. She is having nausea and she is not able to eat. She is drinking water well and urinating regularly.   She is keeping a check on her oxygen levels and temperature.  Tylenol is currently keeping her fever at bay.

## 2018-09-16 NOTE — Telephone Encounter (Signed)
Spoke with son to let him know that I have attempted to contact his mom and she has not answered/called back.   -He is concerned that she is taking a lot of extra tylenol because her pain medications having tylenol in them - he's concerned that she is getting too much. -He is concerned about her other medications she is on because of the possibility of Covid.   -He has been advised that he should not be over at their house, but he is very worried about her being alone - he is aware that if he goes then he will have to quarantine as well, but he is willing to do that if he needs to take care of his mom. He does not want her to get bad and no one there to check on her.

## 2018-09-16 NOTE — Telephone Encounter (Signed)
If he is concerned about his mother, I would recommend he put on a mask, go to the home, see if he can assess her through a door or window.  If that is not possible, he will have to decide if he wants to go inside the house and be exposed.  If he is, it is a 14 day quarantine (longer if he develops sxs), but it is understandable to worry and want to care for mom.  If possible, please schedule pt for video visit when we are able to reach her

## 2018-09-16 NOTE — Telephone Encounter (Signed)
I have spoken with son and he is understanding. He is going to keep a check on her tonight and make sure he can get a hold of her. If they cannot, they will call for a welfare check.  He is going to let her know that we will be calling first thing in the morning to schedule a virtual visit.    He was very appreciative of the help.

## 2018-09-16 NOTE — Telephone Encounter (Signed)
Left message for patient to call so that we could check on current symptoms.

## 2018-09-16 NOTE — Telephone Encounter (Signed)
LM for patient to call.  Want to check in with her for symptoms and see how she's feeling.

## 2018-09-16 NOTE — Telephone Encounter (Addendum)
Pt son is calling last night pt  temp was 102.6 and this morning its 101.4.  Pt is taking tylenol which son some concerns about tylenol. Pt son would like a nurse to reach out to his mother and he does not want his name mention

## 2018-09-17 ENCOUNTER — Emergency Department (HOSPITAL_COMMUNITY): Payer: Medicare Other

## 2018-09-17 ENCOUNTER — Encounter (HOSPITAL_COMMUNITY): Payer: Self-pay

## 2018-09-17 ENCOUNTER — Encounter: Payer: Self-pay | Admitting: Family Medicine

## 2018-09-17 ENCOUNTER — Ambulatory Visit (INDEPENDENT_AMBULATORY_CARE_PROVIDER_SITE_OTHER): Payer: Medicare Other | Admitting: Family Medicine

## 2018-09-17 ENCOUNTER — Inpatient Hospital Stay (HOSPITAL_COMMUNITY)
Admission: EM | Admit: 2018-09-17 | Discharge: 2018-10-01 | DRG: 177 | Disposition: A | Discharge status: Home Health | Payer: Medicare Other | Attending: Internal Medicine | Admitting: Internal Medicine

## 2018-09-17 ENCOUNTER — Other Ambulatory Visit: Payer: Self-pay

## 2018-09-17 VITALS — HR 100 | Temp 101.1°F | Resp 44 | Ht 63.0 in

## 2018-09-17 DIAGNOSIS — E876 Hypokalemia: Secondary | ICD-10-CM

## 2018-09-17 DIAGNOSIS — J9621 Acute and chronic respiratory failure with hypoxia: Secondary | ICD-10-CM | POA: Diagnosis present

## 2018-09-17 DIAGNOSIS — R1310 Dysphagia, unspecified: Secondary | ICD-10-CM | POA: Diagnosis not present

## 2018-09-17 DIAGNOSIS — M329 Systemic lupus erythematosus, unspecified: Secondary | ICD-10-CM | POA: Diagnosis not present

## 2018-09-17 DIAGNOSIS — G92 Toxic encephalopathy: Secondary | ICD-10-CM | POA: Diagnosis not present

## 2018-09-17 DIAGNOSIS — E063 Autoimmune thyroiditis: Secondary | ICD-10-CM | POA: Diagnosis not present

## 2018-09-17 DIAGNOSIS — R0689 Other abnormalities of breathing: Secondary | ICD-10-CM | POA: Diagnosis not present

## 2018-09-17 DIAGNOSIS — J189 Pneumonia, unspecified organism: Secondary | ICD-10-CM

## 2018-09-17 DIAGNOSIS — R112 Nausea with vomiting, unspecified: Secondary | ICD-10-CM | POA: Diagnosis present

## 2018-09-17 DIAGNOSIS — J9811 Atelectasis: Secondary | ICD-10-CM | POA: Diagnosis not present

## 2018-09-17 DIAGNOSIS — M3500 Sicca syndrome, unspecified: Secondary | ICD-10-CM | POA: Diagnosis not present

## 2018-09-17 DIAGNOSIS — I5032 Chronic diastolic (congestive) heart failure: Secondary | ICD-10-CM | POA: Diagnosis present

## 2018-09-17 DIAGNOSIS — M069 Rheumatoid arthritis, unspecified: Secondary | ICD-10-CM | POA: Diagnosis not present

## 2018-09-17 DIAGNOSIS — J168 Pneumonia due to other specified infectious organisms: Secondary | ICD-10-CM | POA: Diagnosis not present

## 2018-09-17 DIAGNOSIS — Z90710 Acquired absence of both cervix and uterus: Secondary | ICD-10-CM

## 2018-09-17 DIAGNOSIS — R0600 Dyspnea, unspecified: Secondary | ICD-10-CM

## 2018-09-17 DIAGNOSIS — R531 Weakness: Secondary | ICD-10-CM | POA: Diagnosis not present

## 2018-09-17 DIAGNOSIS — Z8249 Family history of ischemic heart disease and other diseases of the circulatory system: Secondary | ICD-10-CM

## 2018-09-17 DIAGNOSIS — Z09 Encounter for follow-up examination after completed treatment for conditions other than malignant neoplasm: Secondary | ICD-10-CM | POA: Diagnosis not present

## 2018-09-17 DIAGNOSIS — Z79890 Hormone replacement therapy (postmenopausal): Secondary | ICD-10-CM

## 2018-09-17 DIAGNOSIS — D72810 Lymphocytopenia: Secondary | ICD-10-CM | POA: Diagnosis present

## 2018-09-17 DIAGNOSIS — J159 Unspecified bacterial pneumonia: Secondary | ICD-10-CM | POA: Diagnosis not present

## 2018-09-17 DIAGNOSIS — I5033 Acute on chronic diastolic (congestive) heart failure: Secondary | ICD-10-CM | POA: Diagnosis present

## 2018-09-17 DIAGNOSIS — T40605A Adverse effect of unspecified narcotics, initial encounter: Secondary | ICD-10-CM | POA: Diagnosis not present

## 2018-09-17 DIAGNOSIS — J9600 Acute respiratory failure, unspecified whether with hypoxia or hypercapnia: Secondary | ICD-10-CM

## 2018-09-17 DIAGNOSIS — E038 Other specified hypothyroidism: Secondary | ICD-10-CM | POA: Diagnosis not present

## 2018-09-17 DIAGNOSIS — R5081 Fever presenting with conditions classified elsewhere: Secondary | ICD-10-CM | POA: Diagnosis not present

## 2018-09-17 DIAGNOSIS — M797 Fibromyalgia: Secondary | ICD-10-CM | POA: Diagnosis not present

## 2018-09-17 DIAGNOSIS — J1289 Other viral pneumonia: Secondary | ICD-10-CM | POA: Diagnosis not present

## 2018-09-17 DIAGNOSIS — G43909 Migraine, unspecified, not intractable, without status migrainosus: Secondary | ICD-10-CM | POA: Diagnosis present

## 2018-09-17 DIAGNOSIS — R197 Diarrhea, unspecified: Secondary | ICD-10-CM | POA: Diagnosis not present

## 2018-09-17 DIAGNOSIS — J80 Acute respiratory distress syndrome: Secondary | ICD-10-CM

## 2018-09-17 DIAGNOSIS — E039 Hypothyroidism, unspecified: Secondary | ICD-10-CM | POA: Diagnosis not present

## 2018-09-17 DIAGNOSIS — J9601 Acute respiratory failure with hypoxia: Secondary | ICD-10-CM | POA: Diagnosis not present

## 2018-09-17 DIAGNOSIS — R070 Pain in throat: Secondary | ICD-10-CM | POA: Diagnosis not present

## 2018-09-17 DIAGNOSIS — R6889 Other general symptoms and signs: Secondary | ICD-10-CM

## 2018-09-17 DIAGNOSIS — R509 Fever, unspecified: Secondary | ICD-10-CM

## 2018-09-17 DIAGNOSIS — Z20822 Contact with and (suspected) exposure to covid-19: Secondary | ICD-10-CM

## 2018-09-17 DIAGNOSIS — Z7952 Long term (current) use of systemic steroids: Secondary | ICD-10-CM | POA: Diagnosis not present

## 2018-09-17 DIAGNOSIS — Z20828 Contact with and (suspected) exposure to other viral communicable diseases: Secondary | ICD-10-CM | POA: Diagnosis not present

## 2018-09-17 DIAGNOSIS — Z981 Arthrodesis status: Secondary | ICD-10-CM

## 2018-09-17 DIAGNOSIS — Z803 Family history of malignant neoplasm of breast: Secondary | ICD-10-CM

## 2018-09-17 DIAGNOSIS — Z833 Family history of diabetes mellitus: Secondary | ICD-10-CM

## 2018-09-17 DIAGNOSIS — Z8679 Personal history of other diseases of the circulatory system: Secondary | ICD-10-CM

## 2018-09-17 DIAGNOSIS — G9340 Encephalopathy, unspecified: Secondary | ICD-10-CM | POA: Diagnosis not present

## 2018-09-17 DIAGNOSIS — M05711 Rheumatoid arthritis with rheumatoid factor of right shoulder without organ or systems involvement: Secondary | ICD-10-CM | POA: Diagnosis not present

## 2018-09-17 DIAGNOSIS — R630 Anorexia: Secondary | ICD-10-CM | POA: Diagnosis present

## 2018-09-17 DIAGNOSIS — Z79899 Other long term (current) drug therapy: Secondary | ICD-10-CM

## 2018-09-17 DIAGNOSIS — Z79891 Long term (current) use of opiate analgesic: Secondary | ICD-10-CM | POA: Diagnosis not present

## 2018-09-17 DIAGNOSIS — Z9049 Acquired absence of other specified parts of digestive tract: Secondary | ICD-10-CM

## 2018-09-17 DIAGNOSIS — R05 Cough: Secondary | ICD-10-CM | POA: Diagnosis not present

## 2018-09-17 DIAGNOSIS — R0602 Shortness of breath: Secondary | ICD-10-CM | POA: Diagnosis not present

## 2018-09-17 DIAGNOSIS — Z825 Family history of asthma and other chronic lower respiratory diseases: Secondary | ICD-10-CM

## 2018-09-17 LAB — CBC WITH DIFFERENTIAL/PLATELET
Abs Immature Granulocytes: 0.07 10*3/uL (ref 0.00–0.07)
Basophils Absolute: 0 10*3/uL (ref 0.0–0.1)
Basophils Relative: 0 %
Eosinophils Absolute: 0 10*3/uL (ref 0.0–0.5)
Eosinophils Relative: 0 %
HCT: 40.8 % (ref 36.0–46.0)
Hemoglobin: 13.1 g/dL (ref 12.0–15.0)
Immature Granulocytes: 1 %
Lymphocytes Relative: 11 %
Lymphs Abs: 0.6 10*3/uL — ABNORMAL LOW (ref 0.7–4.0)
MCH: 31.8 pg (ref 26.0–34.0)
MCHC: 32.1 g/dL (ref 30.0–36.0)
MCV: 99 fL (ref 80.0–100.0)
Monocytes Absolute: 0.2 10*3/uL (ref 0.1–1.0)
Monocytes Relative: 5 %
Neutro Abs: 4.3 10*3/uL (ref 1.7–7.7)
Neutrophils Relative %: 83 %
Platelets: 168 10*3/uL (ref 150–400)
RBC: 4.12 MIL/uL (ref 3.87–5.11)
RDW: 13.1 % (ref 11.5–15.5)
WBC: 5.2 10*3/uL (ref 4.0–10.5)
nRBC: 0 % (ref 0.0–0.2)

## 2018-09-17 LAB — COMPREHENSIVE METABOLIC PANEL
ALT: 32 U/L (ref 0–44)
AST: 61 U/L — ABNORMAL HIGH (ref 15–41)
Albumin: 3.8 g/dL (ref 3.5–5.0)
Alkaline Phosphatase: 157 U/L — ABNORMAL HIGH (ref 38–126)
Anion gap: 12 (ref 5–15)
BUN: 14 mg/dL (ref 8–23)
CO2: 24 mmol/L (ref 22–32)
Calcium: 8.7 mg/dL — ABNORMAL LOW (ref 8.9–10.3)
Chloride: 100 mmol/L (ref 98–111)
Creatinine, Ser: 1.01 mg/dL — ABNORMAL HIGH (ref 0.44–1.00)
GFR calc Af Amer: >60 mL/min (ref >60)
GFR calc non Af Amer: 59 mL/min — ABNORMAL LOW (ref >60)
Glucose, Bld: 111 mg/dL — ABNORMAL HIGH (ref 70–99)
Potassium: 2.7 mmol/L — CL (ref 3.5–5.1)
Sodium: 136 mmol/L (ref 135–145)
Total Bilirubin: 0.7 mg/dL (ref 0.3–1.2)
Total Protein: 7.5 g/dL (ref 6.5–8.1)

## 2018-09-17 LAB — PROCALCITONIN: Procalcitonin: <0.1 ng/mL

## 2018-09-17 LAB — LACTATE DEHYDROGENASE: LDH: 392 U/L — ABNORMAL HIGH (ref 98–192)

## 2018-09-17 LAB — LACTIC ACID, PLASMA: Lactic Acid, Venous: 1.5 mmol/L (ref 0.5–1.9)

## 2018-09-17 LAB — FIBRINOGEN: Fibrinogen: 790 mg/dL — ABNORMAL HIGH (ref 210–475)

## 2018-09-17 LAB — SEDIMENTATION RATE: Sed Rate: 48 mm/hr — ABNORMAL HIGH (ref 0–22)

## 2018-09-17 LAB — C-REACTIVE PROTEIN: CRP: 14.5 mg/dL — ABNORMAL HIGH (ref <1.0)

## 2018-09-17 LAB — D-DIMER, QUANTITATIVE (NOT AT ARMC): D-Dimer, Quant: 1.18 ug/mL-FEU — ABNORMAL HIGH (ref 0.00–0.50)

## 2018-09-17 LAB — MAGNESIUM: Magnesium: 1.8 mg/dL (ref 1.7–2.4)

## 2018-09-17 LAB — ABO/RH: ABO/RH(D): O POS

## 2018-09-17 LAB — TRIGLYCERIDES: Triglycerides: 101 mg/dL (ref <150)

## 2018-09-17 LAB — FERRITIN: Ferritin: 593 ng/mL — ABNORMAL HIGH (ref 11–307)

## 2018-09-17 LAB — SARS CORONAVIRUS 2 BY RT PCR (HOSPITAL ORDER, PERFORMED IN ~~LOC~~ HOSPITAL LAB): SARS Coronavirus 2: POSITIVE — AB

## 2018-09-17 MED ORDER — HYDROCOD POLST-CPM POLST ER 10-8 MG/5ML PO SUER
5.0000 mL | Freq: Two times a day (BID) | ORAL | Status: DC
  Administered 2018-09-17 – 2018-09-23 (×13): 5 mL via ORAL
  Filled 2018-09-17 (×13): qty 5

## 2018-09-17 MED ORDER — ACETAMINOPHEN 325 MG PO TABS: 650.0000 mg | ORAL_TABLET | Freq: Four times a day (QID) | ORAL | Status: DC | PRN

## 2018-09-17 MED ORDER — ACETAMINOPHEN 500 MG PO TABS: 1000.0000 mg | ORAL_TABLET | Freq: Four times a day (QID) | ORAL | Status: DC | PRN

## 2018-09-17 MED ORDER — POTASSIUM CHLORIDE 10 MEQ/100ML IV SOLN
10.0000 meq | INTRAVENOUS | Status: AC
  Administered 2018-09-17: 17:00:00 10 meq via INTRAVENOUS
  Filled 2018-09-17 (×3): qty 100

## 2018-09-17 MED ORDER — SODIUM CHLORIDE 0.9 % IV SOLN
500.0000 mg | Freq: Once | INTRAVENOUS | Status: DC
  Filled 2018-09-17: qty 500

## 2018-09-17 MED ORDER — ENSURE ENLIVE PO LIQD
237.0000 mL | Freq: Two times a day (BID) | ORAL | Status: DC
  Administered 2018-09-18 – 2018-09-22 (×3): 237 mL via ORAL
  Filled 2018-09-17 (×13): qty 237

## 2018-09-17 MED ORDER — SODIUM CHLORIDE 0.9 % IV SOLN
1000.0000 mL | INTRAVENOUS | Status: DC
  Administered 2018-09-17: 16:00:00 1000 mL via INTRAVENOUS

## 2018-09-17 MED ORDER — ENOXAPARIN SODIUM 40 MG/0.4ML ~~LOC~~ SOLN
40.0000 mg | SUBCUTANEOUS | Status: DC
  Administered 2018-09-17 – 2018-09-30 (×14): 40 mg via SUBCUTANEOUS
  Filled 2018-09-17 (×14): qty 0.4

## 2018-09-17 MED ORDER — SULFASALAZINE 500 MG PO TABS
1000.0000 mg | ORAL_TABLET | Freq: Two times a day (BID) | ORAL | Status: DC
  Administered 2018-09-18 – 2018-09-26 (×13): 1000 mg via ORAL
  Filled 2018-09-17 (×33): qty 2

## 2018-09-17 MED ORDER — ONDANSETRON HCL 4 MG/2ML IJ SOLN
4.0000 mg | Freq: Four times a day (QID) | INTRAMUSCULAR | Status: DC | PRN
  Administered 2018-09-18 – 2018-09-19 (×2): 4 mg via INTRAVENOUS
  Filled 2018-09-17 (×2): qty 2

## 2018-09-17 MED ORDER — ZINC SULFATE 220 (50 ZN) MG PO CAPS
220.0000 mg | ORAL_CAPSULE | Freq: Every day | ORAL | Status: DC
  Administered 2018-09-18 – 2018-09-27 (×8): 220 mg via ORAL
  Filled 2018-09-17 (×11): qty 1

## 2018-09-17 MED ORDER — ACETAMINOPHEN 325 MG PO TABS
650.0000 mg | ORAL_TABLET | Freq: Four times a day (QID) | ORAL | Status: DC | PRN
  Administered 2018-09-18 – 2018-10-01 (×9): 650 mg via ORAL
  Filled 2018-09-17 (×10): qty 2

## 2018-09-17 MED ORDER — PREDNISONE 10 MG PO TABS
5.0000 mg | ORAL_TABLET | Freq: Every day | ORAL | Status: DC
  Administered 2018-09-18 – 2018-09-22 (×5): 5 mg via ORAL
  Filled 2018-09-17 (×3): qty 0.5
  Filled 2018-09-17 (×2): qty 1

## 2018-09-17 MED ORDER — BACLOFEN 10 MG PO TABS
10.0000 mg | ORAL_TABLET | Freq: Three times a day (TID) | ORAL | Status: DC
  Administered 2018-09-17 – 2018-09-23 (×18): 10 mg via ORAL
  Filled 2018-09-17 (×27): qty 1

## 2018-09-17 MED ORDER — POTASSIUM CHLORIDE 10 MEQ/100ML IV SOLN
10.0000 meq | INTRAVENOUS | Status: AC
  Administered 2018-09-17 (×4): 10 meq via INTRAVENOUS
  Filled 2018-09-17 (×2): qty 100

## 2018-09-17 MED ORDER — DULOXETINE HCL 60 MG PO CPEP
60.0000 mg | ORAL_CAPSULE | Freq: Every day | ORAL | Status: DC
  Administered 2018-09-17 – 2018-09-30 (×12): 60 mg via ORAL
  Filled 2018-09-17: qty 3
  Filled 2018-09-17 (×2): qty 1
  Filled 2018-09-17: qty 2
  Filled 2018-09-17 (×12): qty 1
  Filled 2018-09-17: qty 3

## 2018-09-17 MED ORDER — ONDANSETRON HCL 4 MG PO TABS
4.0000 mg | ORAL_TABLET | Freq: Four times a day (QID) | ORAL | Status: DC | PRN
  Administered 2018-09-17: 22:00:00 4 mg via ORAL
  Filled 2018-09-17: qty 1

## 2018-09-17 MED ORDER — SODIUM CHLORIDE 0.9 % IV SOLN
1.0000 g | Freq: Once | INTRAVENOUS | Status: DC
  Filled 2018-09-17: qty 10

## 2018-09-17 MED ORDER — POTASSIUM CHLORIDE IN NACL 40-0.9 MEQ/L-% IV SOLN
INTRAVENOUS | Status: DC
  Administered 2018-09-17 – 2018-09-18 (×2): 125 mL/h via INTRAVENOUS
  Filled 2018-09-17 (×3): qty 1000

## 2018-09-17 MED ORDER — GUAIFENESIN-DM 100-10 MG/5ML PO SYRP
10.0000 mL | ORAL_SOLUTION | ORAL | Status: DC | PRN
  Administered 2018-09-18 – 2018-09-28 (×9): 10 mL via ORAL
  Filled 2018-09-17 (×11): qty 10

## 2018-09-17 MED ORDER — ONDANSETRON HCL 4 MG/2ML IJ SOLN
4.0000 mg | Freq: Once | INTRAMUSCULAR | Status: AC
  Administered 2018-09-17: 16:00:00 4 mg via INTRAVENOUS
  Filled 2018-09-17: qty 2

## 2018-09-17 MED ORDER — VITAMIN C 500 MG PO TABS
500.0000 mg | ORAL_TABLET | Freq: Every day | ORAL | Status: DC
  Administered 2018-09-17 – 2018-09-27 (×9): 500 mg via ORAL
  Filled 2018-09-17 (×11): qty 1

## 2018-09-17 MED ORDER — HYDROCOD POLST-CPM POLST ER 10-8 MG/5ML PO SUER: 5.0000 mL | Freq: Two times a day (BID) | ORAL | Status: DC | PRN

## 2018-09-17 MED ORDER — LEVOTHYROXINE SODIUM 75 MCG PO TABS
75.0000 ug | ORAL_TABLET | Freq: Every day | ORAL | Status: DC
  Administered 2018-09-18 – 2018-09-23 (×6): 75 ug via ORAL
  Filled 2018-09-17 (×8): qty 1
  Filled 2018-09-17: qty 3
  Filled 2018-09-17: qty 1

## 2018-09-17 NOTE — Plan of Care (Signed)
  Problem: Education: Goal: Knowledge of risk factors and measures for prevention of condition will improve Outcome: Progressing   Problem: Coping: Goal: Psychosocial and spiritual needs will be supported Outcome: Progressing   Problem: Respiratory: Goal: Will maintain a patent airway Outcome: Progressing Goal: Complications related to the disease process, condition or treatment will be avoided or minimized Outcome: Progressing   

## 2018-09-17 NOTE — Telephone Encounter (Signed)
Patient has been contacted, visit with physician lead to EMS call.   Children have been notified.

## 2018-09-17 NOTE — ED Triage Notes (Signed)
Pt presents to the ED with shortness of breath, fever, cough, and N/V/D. Pt notes t-max 102 two days ago. She has been taking tylenol with last dose today at 12:30. Pt reports spouse tested positive for Covid on 4/7. Pt contacted primary care today and was referred to ED.

## 2018-09-17 NOTE — Telephone Encounter (Signed)
LM for patient to call to schedule a virtual visit.

## 2018-09-17 NOTE — ED Provider Notes (Signed)
[facility]-EMERGENCY DEPT Provider Note   CSN: [id number] Arrival date & time: 09/17/18  1516    History   Chief Complaint Chief Complaint  Patient presents with  . Shortness of Breath  . Fever  . Cough    HPI Becky Boyd is a 63 y.o. female presenting for evaluation of fever, shortness of breath, cough, body aches, diarrhea.  Patient states for the past 4 days, she has been having symptoms.  She reports fever, T-max 102.7.  Nonproductive cough.  Shortness of breath, mostly with exertion to the point where she has difficulty getting into her house.  Patient reports diarrhea, approximately 5 stools a day.  Reports nausea and vomiting, denies hematemesis.  Patient states her husband is currently admitted to the hospital, tested positive for COVID.  She was taking care of him at home, his symptoms began approximately 9 days ago.  Patient has a history of Sjogren's and RA, is on chronic prednisone and immunosuppression.  She denies chest pain, abdominal pain, or abnormal urination.  She has been taking Tylenol, last dose at noon, and cough medicine to help her sleep at night.  No other medications for her symptoms.  Social history obtained from chart review.  Reviewed the visit with her primary care doctor today.  Per primary care, they were concerned about her due to her elevated respiratory rate, which they documented at 44.     HPI  Past Medical History:  Diagnosis Date  . Aneurysm (HCC)   . Anorexia   . Dysphagia   . Fibromyalgia   . Fibromyalgia   . Hashimoto thyroiditis   . Hypothyroidism   . Kidney stones   . Lupus (HCC)   . Migraines   . Rheumatoid arthritis (HCC)   . Sjogren's disease [facility](HCC)     Patient Active Problem List   Diagnosis Date Noted  . Fever 09/17/2018  . Hyperlipidemia 01/27/2017  . Bell's palsy 02/13/2016  . Maxillary sinusitis, acute 09/07/2015  . Hematuria 03/02/2015  . Left lumbar radiculopathy 11/09/2014  . Shortness of  breath 11/09/2014  . Jaw pain 04/13/2014  . Pain in joint, shoulder region 10/21/2013  . Chest pain 08/31/2013  . Post-nasal drip 08/31/2013  . Abnormal brain MRI 06/17/2013  . Sjogren's syndrome (HCC) 06/17/2013  . Blurry vision 06/17/2013  . Back pain, lumbosacral 09/30/2012  . Trigger point with back pain 03/22/2011  . General medical examination 01/25/2011  . Memory loss 01/25/2011  . Joint pain 11/08/2010  . Fatigue 11/08/2010  . Hypothyroidism 05/02/2008  . HASHIMOTO'S THYROIDITIS 05/02/2008  . ANOREXIA 05/02/2008  . DYSPHAGIA UNSPECIFIED 05/02/2008  . ABDOMINAL PAIN 05/02/2008    Past Surgical History:  Procedure Laterality Date  . ABDOMINAL HYSTERECTOMY    . CERVICAL FUSION  2004   C4-C5 with Instrumentation  . CERVICAL FUSION  1990   C3-C4  . cervical rods    . CHOLECYSTECTOMY    . kidney stone    . l5-s1 surgery    . LUMBAR MICRODISCECTOMY  2004   Bilateral L5-S1     OB History   No obstetric history on file.      Home Medications    Prior to Admission medications   Medication Sig Start Date End Date Taking? Authorizing Provider  acetaminophen (TYLENOL) 325 MG tablet Take 650 mg by mouth every 6 (six) hours as needed for fever or headache.   Yes [provider]  atorvastatin (LIPITOR) 20 MG tablet TAKE ONE TABLET BY MOUTH ONE  TIME DAILY  Patient taking differently: Take 20 mg by mouth daily.  09/02/18  Yes Sheliah Hatch, MD  baclofen (LIORESAL) 10 MG tablet take 1 tablet by mouth 3 times daily 03/13/18  Yes Sheliah Hatch, MD  DULoxetine (CYMBALTA) 60 MG capsule Take 60 mg by mouth daily.  11/01/14  Yes [provider]  levothyroxine (SYNTHROID, LEVOTHROID) 75 MCG tablet Take 75 mcg by mouth daily.     Yes [provider]  meloxicam (MOBIC) 15 MG tablet Take 15 mg by mouth daily as needed for pain (takes daily).    Yes [provider]  predniSONE (DELTASONE) 5 MG tablet Take 5 mg by mouth daily with breakfast.    Yes [provider]  sulfaSALAzine (AZULFIDINE) 500 MG tablet 1,000 mg 2 (two) times daily.  01/06/17  Yes [provider]  HYDROcodone-acetaminophen (NORCO/VICODIN) 5-325 MG per tablet Take 1-2 tablets by mouth every 6 (six) hours as needed. Patient not taking: Reported on 09/17/2018 10/11/13   Blake Divine, MD    Family History Family History  Problem Relation Age of Onset  . Diabetes Sister   . Breast cancer Paternal Aunt   . COPD Mother   . Heart disease Father   . COPD Brother   . Aneurysm Paternal Aunt   . Diabetes Other     Social History Social History   Tobacco Use  . Smoking status: Never Smoker  . Smokeless tobacco: Never Used  Substance Use Topics  . Alcohol use: Never    Frequency: Never  . Drug use: Never     Allergies   Plaquenil [hydroxychloroquine sulfate]   Review of Systems Review of Systems  Constitutional: Positive for fever.  Respiratory: Positive for cough and shortness of breath.   Gastrointestinal: Positive for diarrhea and nausea.  Musculoskeletal: Positive for myalgias.  Neurological: Positive for weakness.  All other systems reviewed and are negative.    Physical Exam Updated Vital Signs BP 111/78   Pulse 78   Temp 98.4 F (36.9 C) (Oral)   Resp (!) 29   Ht 5\' 3"  (1.6 m)   Wt 64.4 kg   SpO2 98%   BMI 25.15 kg/m   Physical Exam Vitals signs and nursing note reviewed.  Constitutional:      Appearance: She is well-developed. She is ill-appearing.     Comments: Appears ill  HENT:     Head: Normocephalic and atraumatic.  Eyes:     Extraocular Movements: Extraocular movements intact.     Conjunctiva/sclera: Conjunctivae normal.     Pupils: Pupils are equal, round, and reactive to light.  Neck:     Musculoskeletal: Normal range of motion and neck supple.  Cardiovascular:     Rate and Rhythm: Normal rate and regular rhythm.     Pulses: Normal pulses.  Pulmonary:     Effort: No respiratory distress.      Comments: speaking in short sentences. No accessory muscles use. Dry cough noted during exam.  Patient ambulated around the room, sats remained between 93 and 95, however patient became tachypneic, respiratory rate 35-40.  Patient appeared to have difficulty walking due to weakness and shortness of breath. Abdominal:     General: There is no distension.     Palpations: Abdomen is soft. There is no mass.     Tenderness: There is no abdominal tenderness. There is no guarding or rebound.  Musculoskeletal: Normal range of motion.  Skin:    General: Skin is warm and dry.  Capillary Refill: Capillary refill takes less than 2 seconds.  Neurological:     Mental Status: She is alert and oriented to person, place, and time.      ED Treatments / Results  Labs (all labs ordered are listed, but only abnormal results are displayed) Labs Reviewed  CBC WITH DIFFERENTIAL/PLATELET - Abnormal; Notable for the following components:      Result Value   Lymphs Abs 0.6 (*)    All other components within normal limits  COMPREHENSIVE METABOLIC PANEL - Abnormal; Notable for the following components:   Potassium 2.7 (*)    Glucose, Bld 111 (*)    Creatinine, Ser 1.01 (*)    Calcium 8.7 (*)    AST 61 (*)    Alkaline Phosphatase 157 (*)    GFR calc non Af Amer 59 (*)    All other components within normal limits  LACTATE DEHYDROGENASE - Abnormal; Notable for the following components:   LDH 392 (*)    All other components within normal limits  C-REACTIVE PROTEIN - Abnormal; Notable for the following components:   CRP 14.5 (*)    All other components within normal limits  SARS CORONAVIRUS 2 (HOSPITAL ORDER, PERFORMED IN [facility])  CULTURE, BLOOD (ROUTINE X 2)  CULTURE, BLOOD (ROUTINE X 2)  LACTIC ACID, PLASMA  TRIGLYCERIDES  D-DIMER, QUANTITATIVE (NOT AT [facility])  PROCALCITONIN  FERRITIN  FIBRINOGEN    EKG None  Radiology Dg Chest Port 1 View  Result Date: 09/17/2018 CLINICAL  DATA:  Shortness of breath EXAM: PORTABLE CHEST 1 VIEW COMPARISON:  Chest radiograph and chest CT Nov 01, 2014 FINDINGS: There is focal airspace opacity in the left base. There is slight right base atelectasis. The lungs elsewhere are clear. Heart size and pulmonary vascularity are normal. No adenopathy. There is aortic atherosclerosis. There are calcified breast implants bilaterally. There is postoperative change in the lower cervical spine. IMPRESSION: Focal airspace opacity in the left base, consistent with pneumonia. Mild right base atelectasis. Stable cardiac silhouette. Calcified breast implants noted. Aortic Atherosclerosis (ICD10-I70.0). Followup PA and lateral chest radiographs recommended in 3-4 weeks following trial of antibiotic therapy to ensure resolution and exclude underlying malignancy. Electronically Signed   By: Bretta Bang III M.D.   On: 09/17/2018 16:15    Procedures .Critical Care Performed by: Alveria Apley, PA-C Authorized by: Alveria Apley, PA-C   Critical care provider statement:    Critical care time (minutes):  35   Critical care time was exclusive of:  Separately billable procedures and treating other patients and teaching time   Critical care was necessary to treat or prevent imminent or life-threatening deterioration of the following conditions:  Metabolic crisis   Critical care was time spent personally by me on the following activities:  Blood draw for specimens, development of treatment plan with patient or surrogate, examination of patient, evaluation of patient's response to treatment, obtaining history from patient or surrogate, ordering and review of laboratory studies, ordering and performing treatments and interventions, ordering and review of radiographic studies, pulse oximetry, review of old charts and re-evaluation of patient's condition   I assumed direction of critical care for this patient from another provider in my specialty: no   Comments:      Pt with low potassium at 2.7. multiple runs of IV potassium given. Pt admitted.    (including critical care time)  Medications Ordered in ED Medications  0.9 %  sodium chloride infusion (1,000 mLs Intravenous New Bag/Given 09/17/18 1615)  cefTRIAXone (ROCEPHIN) 1 g in sodium chloride 0.9 % 100 mL IVPB (has no administration in time range)  azithromycin (ZITHROMAX) 500 mg in sodium chloride 0.9 % 250 mL IVPB (has no administration in time range)  potassium chloride 10 mEq in 100 mL IVPB (has no administration in time range)  ondansetron (ZOFRAN) injection 4 mg (4 mg Intravenous Given 09/17/18 1617)     Initial Impression / Assessment and Plan / ED Course  I have reviewed the triage vital signs and the nursing notes.  Pertinent labs & imaging results that were available during my care of the patient were reviewed by me and considered in my medical decision making (see chart for details).        Patient presenting for evaluation of fever, cough, shortness of breath, body aches.  Symptoms concerning for COVID, especially considering this patient's husband is currently admitted with COVID.  Have higher concern for patient, considering her increased respiratory rate with ambulation and her immunosuppression.  While she is not requiring oxygen at this time, I am concerned that she may worsen suddenly, and she is living at home by herself.  Labs, x-ray, and EKG ordered.  Zofran for nausea.  Gentle fluids started for dehydration, no bolus 2/2 covid guidelines.   Labs show hypokalemia at 2.7, potassium started.  Creatinine mildly elevated at 1.01.  LDH and CRP elevated, consistent with COVID.  Test pending.  X-ray viewed interpreted by me, inserting for pneumonia.  Antibiotics started.  EKG without obvious U waves.  Will call for admission.  Discussed with Dr[facility] Butler Denmarkizwan [facility], patient to be admitted.  Becky HewLinda Frees was evaluated in Emergency Department on 09/17/2018 for the  symptoms described in the history of present illness. She was evaluated in the context of the global COVID-19 pandemic, which necessitated consideration that the patient might be at risk for infection with the SARS-CoV-2 virus that causes COVID-19. Institutional protocols and algorithms that pertain to the evaluation of patients at risk for COVID-19 are in a state of rapid change based on information released by regulatory bodies including the CDC and federal and state organizations. These policies and algorithms were followed during the patient's care in the ED.    Final Clinical Impressions(s) / ED Diagnoses   Final diagnoses:  Suspected Covid-19 Virus Infection  Pneumonia due to infectious organism, unspecified laterality, unspecified part of lung  Hypokalemia    ED Discharge Orders    None       Alveria ApleyCaccavale, Tamarick Kovalcik, PA-C 09/17/18 1713    Lorre NickAllen, Anthony, MD 09/17/18 2247

## 2018-09-17 NOTE — ED Notes (Signed)
ED TO INPATIENT HANDOFF REPORT  ED Nurse Name and Phone #:  Dandria Griego and Nelly Laurence Name/Age/Gender [name] 63 y.o. female Room/Bed: WA12/WA12  Code Status   Code Status: Full Code  Home/SNF/Other Home Patient oriented to: self, place, time and situation Is this baseline? Yes   Triage Complete: Triage complete  Chief Complaint High Risk Covid, Fever, SOB  Triage Note Pt presents to the ED with shortness of breath, fever, cough, and N/V/D. Pt notes t-max 102 two days ago. She has been taking tylenol with last dose today at 12:30. Pt reports spouse tested positive for Covid on 4/7. Pt contacted primary care today and was referred to ED.    Allergies Allergies  Allergen Reactions  . Plaquenil [Hydroxychloroquine Sulfate]     Skin feels like bee stings    Level of Care/Admitting Diagnosis ED Disposition    ED Disposition Condition Comment   Admit  Hospital Area: [facility] [[id number]]  Level of Care: Telemetry [5]  Admit to tele based on following criteria: Other see comments  Comments: Hypokalemia  Covid Evaluation: Confirmed COVID Positive  Isolation Risk Level: Low Risk  (Less than 4L [place] supplementation)  Diagnosis: COVID-19 virus infection [[id number]]  Admitting Physician: Calvert Cantor [[id number]]  Attending Physician: Calvert Cantor [[id number]]  Estimated length of stay: past midnight tomorrow  Certification:: I certify this patient will need inpatient services for at least 2 midnights  PT Class (Do Not Modify): Inpatient [101]  PT Acc Code (Do Not Modify): Private [1]       B Medical/Surgery History Past Medical History:  Diagnosis Date  . Aneurysm (HCC)   . Anorexia   . Dysphagia   . Fibromyalgia   . Fibromyalgia   . Hashimoto thyroiditis   . Hypothyroidism   . Kidney stones   . Lupus (HCC)   . Migraines   . Rheumatoid arthritis (HCC)   . Sjogren's disease [facility])    Past Surgical History:  Procedure Laterality Date  . ABDOMINAL  HYSTERECTOMY    . CERVICAL FUSION  2004   C4-C5 with Instrumentation  . CERVICAL FUSION  1990   C3-C4  . cervical rods    . CHOLECYSTECTOMY    . kidney stone    . l5-s1 surgery    . LUMBAR MICRODISCECTOMY  2004   Bilateral L5-S1     A IV Location/Drains/Wounds Patient Lines/Drains/Airways Status   Active Line/Drains/Airways    Name:   Placement date:   Placement time:   Site:   Days:   Peripheral IV 09/17/18 Right Antecubital   09/17/18    1540    Antecubital   less than 1   Peripheral IV 09/17/18 Left Antecubital   09/17/18    1632    Antecubital   less than 1          Intake/Output Last 24 hours No intake or output data in the 24 hours ending 09/17/18 1755  Labs/Imaging Results for orders placed or performed during the hospital encounter of 09/17/18 (from the past 48 hour(s))  SARS Coronavirus 2 [facility] order, Performed in [facility] hospital lab)     Status: Abnormal   Collection Time: 09/17/18  3:50 PM  Result Value Ref Range   SARS Coronavirus 2 POSITIVE (A) NEGATIVE    Comment: RESULT CALLED TO, READ BACK BY AND VERIFIED WITH: S.BINGHAM AT 1717 ON 09/17/18 BY N.THOMPSON (NOTE) If result is NEGATIVE SARS-CoV-2 target nucleic acids are NOT DETECTED. The SARS-CoV-2 RNA is  generally detectable in upper and lower  respiratory specimens during the acute phase of infection. The lowest  concentration of SARS-CoV-2 viral copies this assay can detect is 250  copies / mL. A negative result does not preclude SARS-CoV-2 infection  and should not be used as the sole basis for treatment or other  patient management decisions.  A negative result may occur with  improper specimen collection / handling, submission of specimen other  than nasopharyngeal swab, presence of viral mutation(s) within the  areas targeted by this assay, and inadequate number of viral copies  (<250 copies / mL). A negative result must be combined with clinical  observations, patient history, and  epidemiological information. If result is POSITIVE SARS-CoV-2 target nucleic acids are DETEC TED. The SARS-CoV-2 RNA is generally detectable in upper and lower  respiratory specimens during the acute phase of infection.  Positive  results are indicative of active infection with SARS-CoV-2.  Clinical  correlation with patient history and other diagnostic information is  necessary to determine patient infection status.  Positive results do  not rule out bacterial infection or co-infection with other viruses. If result is PRESUMPTIVE POSTIVE SARS-CoV-2 nucleic acids MAY BE PRESENT.   A presumptive positive result was obtained on the submitted specimen  and confirmed on repeat testing.  While 2019 novel coronavirus  (SARS-CoV-2) nucleic acids may be present in the submitted sample  additional confirmatory testing may be necessary for epidemiological  and / or clinical management purposes  to differentiate between  SARS-CoV-2 and other Sarbecovirus currently known to infect humans.  If clinically indicated additional testing with an alternate test  methodology (LAB7 [id number]) is advised. The SARS-CoV-2 RNA is generally  detectable in upper and lower respiratory specimens during the acute  phase of infection. The expected result is Negative. Fact Sheet for Patients:  BoilerBrush.com.cy Fact Sheet for Healthcare Providers: https://pope.com/ This test is not yet approved or cleared by the [place] FDA and has been authorized for detection and/or diagnosis of SARS-CoV-2 by FDA under an Emergency Use Authorization (EUA).  This EUA will remain in effect (meaning this test can be used) for the duration of the COVID-19 declaration under Section 564(b)(1) of the Act, 21 U.S.C. section 360bbb-3(b)(1), unless the authorization is terminated or revoked sooner. Performed at [facility], [address] [place], [place] [address]    Lactic acid, plasma     Status: None   Collection Time: 09/17/18  3:50 PM  Result Value Ref Range   Lactic Acid, Venous 1.5 0.5 - 1.9 mmol/L    Comment: Performed at [facility], [address], [place], [place] [address]  CBC WITH DIFFERENTIAL     Status: Abnormal   Collection Time: 09/17/18  3:50 PM  Result Value Ref Range   WBC 5.2 4.0 - 10.5 K/uL   RBC 4.12 3.87 - 5.11 MIL/uL   Hemoglobin 13.1 12.0 - 15.0 g/dL   HCT 86.5 78.4 - 69.6 %   MCV 99.0 80.0 - 100.0 fL   MCH 31.8 26.0 - 34.0 pg   MCHC 32.1 30.0 - 36.0 g/dL   RDW 29.5 28.4 - 13.2 %   Platelets 168 150 - 400 K/uL   nRBC 0.0 0.0 - 0.2 %   Neutrophils Relative % 83 %   Neutro Abs 4.3 1.7 - 7.7 K/uL   Lymphocytes Relative 11 %   Lymphs Abs 0.6 (L) 0.7 - 4.0 K/uL   Monocytes Relative 5 %   Monocytes  Absolute 0.2 0.1 - 1.0 K/uL   Eosinophils Relative 0 %   Eosinophils Absolute 0.0 0.0 - 0.5 K/uL   Basophils Relative 0 %   Basophils Absolute 0.0 0.0 - 0.1 K/uL   WBC Morphology MORPHOLOGY UNREMARKABLE    Immature Granulocytes 1 %   Abs Immature Granulocytes 0.07 0.00 - 0.07 K/uL    Comment: Performed at [facility], [address] [place], [place] [address]  Comprehensive metabolic panel     Status: Abnormal   Collection Time: 09/17/18  3:50 PM  Result Value Ref Range   Sodium 136 135 - 145 mmol/L   Potassium 2.7 (LL) 3.5 - 5.1 mmol/L    Comment: CRITICAL RESULT CALLED TO, READ BACK BY AND VERIFIED WITH: S.BINGHAM AT 1642 ON 09/17/18 BY N.THOMPSON    Chloride 100 98 - 111 mmol/L   CO2 24 22 - 32 mmol/L   Glucose, Bld 111 (H) 70 - 99 mg/dL   BUN 14 8 - 23 mg/dL   Creatinine, Ser 6.04 (H) 0.44 - 1.00 mg/dL   Calcium 8.7 (L) 8.9 - 10.3 mg/dL   Total Protein 7.5 6.5 - 8.1 g/dL   Albumin 3.8 3.5 - 5.0 g/dL   AST 61 (H) 15 - 41 U/L   ALT 32 0 - 44 U/L   Alkaline Phosphatase 157 (H) 38 - 126 U/L   Total Bilirubin 0.7 0.3 - 1.2 mg/dL   GFR calc non Af Amer 59 (L) >60 mL/min    GFR calc Af Amer >60 >60 mL/min   Anion gap 12 5 - 15    Comment: Performed at [facility], [address], [place], [place] [address]  D-dimer, quantitative     Status: Abnormal   Collection Time: 09/17/18  3:50 PM  Result Value Ref Range   D-Dimer, Quant 1.18 (H) 0.00 - 0.50 ug/mL-FEU    Comment: (NOTE) At the manufacturer cut-off of 0.50 ug/mL FEU, this assay has been documented to exclude PE with a sensitivity and negative predictive value of 97 to 99%.  At this time, this assay has not been approved by the FDA to exclude DVT/VTE. Results should be correlated with clinical presentation. Performed at [facility], [address] [place], [place] [address]   Procalcitonin     Status: None   Collection Time: 09/17/18  3:50 PM  Result Value Ref Range   Procalcitonin <0.10 ng/mL    Comment:        Interpretation: PCT (Procalcitonin) <= 0.5 ng/mL: Systemic infection (sepsis) is not likely. Local bacterial infection is possible. (NOTE)       Sepsis PCT Algorithm           Lower Respiratory Tract                                      Infection PCT Algorithm    ----------------------------     ----------------------------         PCT < 0.25 ng/mL                PCT < 0.10 ng/mL         Strongly encourage             Strongly discourage   discontinuation of antibiotics    initiation of antibiotics    ----------------------------     -----------------------------       PCT 0.25 -  0.50 ng/mL            PCT 0.10 - 0.25 ng/mL               OR       >80% decrease in PCT            Discourage initiation of                                            antibiotics      Encourage discontinuation           of antibiotics    ----------------------------     -----------------------------         PCT >= 0.50 ng/mL              PCT 0.26 - 0.50 ng/mL               AND        <80% decrease in PCT             Encourage initiation of                                              antibiotics       Encourage continuation           of antibiotics    ----------------------------     -----------------------------        PCT >= 0.50 ng/mL                  PCT > 0.50 ng/mL               AND         increase in PCT                  Strongly encourage                                      initiation of antibiotics    Strongly encourage escalation           of antibiotics                                     -----------------------------                                           PCT <= 0.25 ng/mL                                                 OR                                        > 80% decrease in PCT  Discontinue / Do not initiate                                             antibiotics Performed at [facility], [address], [place], [place] [address]   Lactate dehydrogenase     Status: Abnormal   Collection Time: 09/17/18  3:50 PM  Result Value Ref Range   LDH 392 (H) 98 - 192 U/L    Comment: Performed at [facility], [address], [place], [place] [address]  Ferritin     Status: Abnormal   Collection Time: 09/17/18  3:50 PM  Result Value Ref Range   Ferritin 593 (H) 11 - 307 ng/mL    Comment: Performed at [facility], [address], [place], [place] [address]  Triglycerides     Status: None   Collection Time: 09/17/18  3:50 PM  Result Value Ref Range   Triglycerides 101 <150 mg/dL    Comment: Performed at [facility], [address], [place], [place] [address]  Fibrinogen     Status: Abnormal   Collection Time: 09/17/18  3:50 PM  Result Value Ref Range   Fibrinogen 790 (H) 210 - 475 mg/dL    Comment: Performed at [facility], [address], [place], [place] [address]  C-reactive protein     Status: Abnormal   Collection Time: 09/17/18  3:50 PM  Result Value Ref Range   CRP 14.5 (H) <1.0 mg/dL     Comment: Performed at [facility], [address] [place], [place] [address]   Dg Chest Port 1 View  Result Date: 09/17/2018 CLINICAL DATA:  Shortness of breath EXAM: PORTABLE CHEST 1 VIEW COMPARISON:  Chest radiograph and chest CT Nov 01, 2014 FINDINGS: There is focal airspace opacity in the left base. There is slight right base atelectasis. The lungs elsewhere are clear. Heart size and pulmonary vascularity are normal. No adenopathy. There is aortic atherosclerosis. There are calcified breast implants bilaterally. There is postoperative change in the lower cervical spine. IMPRESSION: Focal airspace opacity in the left base, consistent with pneumonia. Mild right base atelectasis. Stable cardiac silhouette. Calcified breast implants noted. Aortic Atherosclerosis (ICD10-I70.0). Followup PA and lateral chest radiographs recommended in 3-4 weeks following trial of antibiotic therapy to ensure resolution and exclude underlying malignancy. Electronically Signed   By: Bretta Bang III M.D.   On: 09/17/2018 16:15    Pending Labs Unresulted Labs (From admission, onward)    Start     Ordered   09/24/18 0500  Creatinine, serum  (enoxaparin (LOVENOX)    CrCl >/= 30 ml/min)  Weekly,   R    Comments:  while on enoxaparin therapy    09/17/18 1744   09/18/18 0500  Comprehensive metabolic panel  Daily,   R     09/17/18 1744   09/17/18 1754  HIV antibody (Routine Testing)  Add-on,   R     09/17/18 1753   09/17/18 1754  Interleukin-6, Plasma  Add-on,   R     09/17/18 1753   09/17/18 1754  Magnesium  Add-on,   R     09/17/18 1753   09/17/18 1754  Sedimentation rate  Add-on,   R     09/17/18 1753   09/17/18 1753  Glucose 6 phosphate dehydrogenase  Add-on,  R     09/17/18 1753   09/17/18 1753  Hepatitis B surface antigen  Add-on,   R     09/17/18 1753   09/17/18 1735  ABO/Rh  Once,   R     09/17/18 1744   09/17/18 1550  Blood Culture (routine x 2)  BLOOD CULTURE X 2,   STAT     Question:  Patient immune status  Answer:  Immunocompromised   09/17/18 1551          Vitals/Pain Today's Vitals   09/17/18 1659 09/17/18 1700 09/17/18 1701 09/17/18 1730  BP:  111/78  119/80  Pulse:  78  75  Resp: 15 (!) 26 (!) 29 (!) 21  Temp:      TempSrc:      SpO2:  98%  96%  Weight:      Height:        Isolation Precautions Droplet and Contact precautions  Medications Medications  0.9 %  sodium chloride infusion (1,000 mLs Intravenous New Bag/Given 09/17/18 1615)  potassium chloride 10 mEq in 100 mL IVPB (10 mEq Intravenous New Bag/Given 09/17/18 1724)  enoxaparin (LOVENOX) injection 40 mg (has no administration in time range)  0.9 % NaCl with KCl 40 mEq / L  infusion (has no administration in time range)  guaiFENesin-dextromethorphan (ROBITUSSIN DM) 100-10 MG/5ML syrup 10 mL (has no administration in time range)  vitamin C (ASCORBIC ACID) tablet 500 mg (has no administration in time range)  zinc sulfate capsule 220 mg (has no administration in time range)  acetaminophen (TYLENOL) tablet 650 mg (has no administration in time range)  ondansetron (ZOFRAN) tablet 4 mg (has no administration in time range)    Or  ondansetron (ZOFRAN) injection 4 mg (has no administration in time range)  baclofen (LIORESAL) tablet 10 mg (has no administration in time range)  DULoxetine (CYMBALTA) DR capsule 60 mg (has no administration in time range)  levothyroxine (SYNTHROID) tablet 75 mcg (has no administration in time range)  predniSONE (DELTASONE) tablet 5 mg (has no administration in time range)  sulfaSALAzine (AZULFIDINE) tablet 1,000 mg (has no administration in time range)  acetaminophen (TYLENOL) tablet 1,000 mg (has no administration in time range)  potassium chloride 10 mEq in 100 mL IVPB (has no administration in time range)  chlorpheniramine-HYDROcodone (TUSSIONEX) 10-8 MG/5ML suspension 5 mL (has no administration in time range)  ondansetron (ZOFRAN) injection 4 mg (4 mg  Intravenous Given 09/17/18 1617)    Mobility walks Low fall risk   Focused Assessments     R Recommendations: See Admitting Provider Note  Report given to:   Additional Notes:

## 2018-09-17 NOTE — H&P (Addendum)
History and Physical    Becky Boyd  WUJ:[id number]  DOB: 1955-12-08  DOA: 09/17/2018 PCP: Midge Minium, MD   Patient coming from: home  Chief Complaint: vomiting/ diarrhea  HPI: Becky Boyd is a 63 y.o. female with medical history of rheumatoid arthritis, Sjogren's, Hashimoto's thyroiditis, fibromyalgia who presents to the hospital for fever cough, nausea, vomiting, diarrhea and fatigue.  Her symptoms started about 8 days ago and have progressed.  Her husband has COVID-19 and is currently hospitalized in [place].  Her last episode of vomiting was yesterday evening and her last episode of diarrhea was this morning.  No blood noted in vomitus or in stool.  Pressure has been as high as 102 degrees.  She has been taking Tylenol for this at home.  She has a cough which is mostly nonproductive and she is short of breath when she walks.  She went to see her PCP today and was recommended to come to the ED  ED Course: COVID 19 positive, calcium 2.7, creatinine 1.01, LDH 392, ferritin 593, CRP 14.5, lymphocytes 0.6, d-dimer 1.18, fibrinogen 790 X-ray shows right lower lobe atelectasis and left lower lobe infiltrate  Review of Systems:  All other systems reviewed and apart from HPI, are negative.  Past Medical History:  Diagnosis Date   Aneurysm ([place])    Anorexia    Dysphagia    Fibromyalgia    Fibromyalgia    Hashimoto thyroiditis    Hypothyroidism    Kidney stones    Lupus ([place])    Migraines    Rheumatoid arthritis ([place])    Sjogren's disease ([place])     Past Surgical History:  Procedure Laterality Date   ABDOMINAL HYSTERECTOMY     CERVICAL FUSION  2004   C4-C5 with Instrumentation   CERVICAL FUSION  1990   C3-C4   cervical rods     CHOLECYSTECTOMY     kidney stone     l5-s1 surgery     LUMBAR MICRODISCECTOMY  2004   Bilateral L5-S1    Social History:   reports that she has never smoked. She has never used smokeless tobacco. She reports that she  does not drink alcohol or use drugs.  Allergies  Allergen Reactions   Plaquenil [Hydroxychloroquine Sulfate]     Skin feels like bee stings    Family History  Problem Relation Age of Onset   Diabetes Sister    Breast cancer Paternal Aunt    COPD Mother    Heart disease Father    COPD Brother    Aneurysm Paternal Aunt    Diabetes Other      Prior to Admission medications   Medication Sig Start Date End Date Taking? Authorizing Provider  acetaminophen (TYLENOL) 325 MG tablet Take 650 mg by mouth every 6 (six) hours as needed for fever or headache.   Yes [provider]  atorvastatin (LIPITOR) 20 MG tablet TAKE ONE TABLET BY MOUTH ONE TIME DAILY  Patient taking differently: Take 20 mg by mouth daily.  09/02/18  Yes Midge Minium, MD  baclofen (LIORESAL) 10 MG tablet take 1 tablet by mouth 3 times daily 03/13/18  Yes Midge Minium, MD  DULoxetine (CYMBALTA) 60 MG capsule Take 60 mg by mouth daily.  11/01/14  Yes [provider]  levothyroxine (SYNTHROID, LEVOTHROID) 75 MCG tablet Take 75 mcg by mouth daily.     Yes [provider]  meloxicam (MOBIC) 15 MG tablet Take 15 mg by mouth daily as needed for  pain (takes daily).    Yes [provider]  predniSONE (DELTASONE) 5 MG tablet Take 5 mg by mouth daily with breakfast.   Yes [provider]  sulfaSALAzine (AZULFIDINE) 500 MG tablet 1,000 mg 2 (two) times daily.  01/06/17  Yes [provider]  HYDROcodone-acetaminophen (NORCO/VICODIN) 5-325 MG per tablet Take 1-2 tablets by mouth every 6 (six) hours as needed. Patient not taking: Reported on 09/17/2018 10/11/13   Serita Grit, MD    Physical Exam: Wt Readings from Last 3 Encounters:  09/17/18 64.4 kg  11/21/17 67.8 kg  10/15/17 67.1 kg   Vitals:   09/17/18 1658 09/17/18 1659 09/17/18 1700 09/17/18 1701  BP:   111/78   Pulse:   78   Resp: (!) 26 15 (!) 26 (!) 29  Temp:      TempSrc:      SpO2:   98%    Weight:      Height:          Constitutional:  Calm & comfortable Eyes: PERRLA, lids and conjunctivae normal ENT:  Mucous membranes are moist.  Pharynx clear of exudate   Normal dentition.  Neck: Supple, no masses  Respiratory: She has a cough Clear to auscultation bilaterally  Normal respiratory effort.  Cardiovascular: She is tachycardic S1 & S2 heard, regular rate and rhythm No Murmurs Abdomen:  Non distended Mild diffuse tenderness, No masses Bowel sounds normal Extremities:  No clubbing / cyanosis No pedal edema No joint deformity    Skin:  No rashes, lesions or ulcers Neurologic:  AAO x 3 CN 2-12 grossly intact Sensation intact Strength 5/5 in all 4 extremities Psychiatric:  Normal Mood and affect    Labs on Admission: I have personally reviewed following labs and imaging studies  CBC: Recent Labs  Lab 09/17/18 1550  WBC 5.2  NEUTROABS 4.3  HGB 13.1  HCT 40.8  MCV 99.0  PLT 950   Basic Metabolic Panel: Recent Labs  Lab 09/17/18 1550  NA 136  K 2.7*  CL 100  CO2 24  GLUCOSE 111*  BUN 14  CREATININE 1.01*  CALCIUM 8.7*   GFR: Estimated Creatinine Clearance: 50.8 mL/min (A) (by C-G formula based on SCr of 1.01 mg/dL (H)). Liver Function Tests: Recent Labs  Lab 09/17/18 1550  AST 61*  ALT 32  ALKPHOS 157*  BILITOT 0.7  PROT 7.5  ALBUMIN 3.8   No results for input(s): LIPASE, AMYLASE in the last 168 hours. No results for input(s): AMMONIA in the last 168 hours. Coagulation Profile: No results for input(s): INR, PROTIME in the last 168 hours. Cardiac Enzymes: No results for input(s): CKTOTAL, CKMB, CKMBINDEX, TROPONINI in the last 168 hours. BNP (last 3 results) No results for input(s): PROBNP in the last 8760 hours. HbA1C: No results for input(s): HGBA1C in the last 72 hours. CBG: No results for input(s): GLUCAP in the last 168 hours. Lipid Profile: Recent Labs    09/17/18 1550  TRIG 101   Thyroid Function Tests: No  results for input(s): TSH, T4TOTAL, FREET4, T3FREE, THYROIDAB in the last 72 hours. Anemia Panel: No results for input(s): VITAMINB12, FOLATE, FERRITIN, TIBC, IRON, RETICCTPCT in the last 72 hours. Urine analysis:    Component Value Date/Time   COLORURINE YELLOW 09/05/2009 2343   APPEARANCEUR CLOUDY (A) 09/05/2009 2343   LABSPEC 1.027 09/05/2009 2343   PHURINE 6.0 09/05/2009 2343   GLUCOSEU 100 (A) 09/05/2009 2343   HGBUR MODERATE (A) 09/05/2009 2343   BILIRUBINUR Negative 01/27/2017 1200  KETONESUR NEGATIVE 09/05/2009 2343   PROTEINUR + 01/27/2017 1200   PROTEINUR NEGATIVE 09/05/2009 2343   UROBILINOGEN 0.2 01/27/2017 1200   UROBILINOGEN 0.2 09/05/2009 2343   NITRITE + 01/27/2017 1200   NITRITE NEGATIVE 09/05/2009 2343   LEUKOCYTESUR Small (1+) (A) 01/27/2017 1200   Sepsis Labs: @LABRCNTIP (procalcitonin:4,lacticidven:4) )No results found for this or any previous visit (from the past 240 hour(s)).   Radiological Exams on Admission: Dg Chest Port 1 View  Result Date: 09/17/2018 CLINICAL DATA:  Shortness of breath EXAM: PORTABLE CHEST 1 VIEW COMPARISON:  Chest radiograph and chest CT Nov 01, 2014 FINDINGS: There is focal airspace opacity in the left base. There is slight right base atelectasis. The lungs elsewhere are clear. Heart size and pulmonary vascularity are normal. No adenopathy. There is aortic atherosclerosis. There are calcified breast implants bilaterally. There is postoperative change in the lower cervical spine. IMPRESSION: Focal airspace opacity in the left base, consistent with pneumonia. Mild right base atelectasis. Stable cardiac silhouette. Calcified breast implants noted. Aortic Atherosclerosis (ICD10-I70.0). Followup PA and lateral chest radiographs recommended in 3-4 weeks following trial of antibiotic therapy to ensure resolution and exclude underlying malignancy. Electronically Signed   By: Lowella Grip III M.D.   On: 09/17/2018 16:15    EKG: Independently  reviewed. NSR at 88 bpm  Assessment/Plan Principal Problem: COVID 19 infection - Fever/ nausea/ vomiting/ diarrhea/ elevated LDH and CRP, low lymphocytes -Next x-ray: Right lower lobe atelectasis and left lower lobe infiltrate -Hydrate with IV fluids - only ice chips for now as she is vomiting up liquids - Antitussives, vitamin C, zinc ordered -She is not yet hypoxic -Follow-up on interleukin-6, glucose-6-phosphate dehydrogenase, hepatitis B surface antigen, HIV, ESR  Active Problems:  Hypokalemia -Check magnesium - Hypokalemia is likely secondary to GI losses -Replace via IV    Hypothyroidism -Continue Synthroid  Rheumatoid arthritis and Sjogren's syndrome (HCC)  -Continue low-dose prednisone and sulfasalazine  Hold Lipitor due to elevated LFTs Hold meloxicam in setting of COVID 19   DVT prophylaxis: Heparin Code Status: Full code Family Communication:  Disposition Plan: Admit to telemetry due to hypokalemia Consults called: None Admission status: Inpatient   Debbe Odea MD [facility] Pager: [email or web address] Password [email or web address] [phone], please contact night-coverage   09/17/2018, 5:04 PM

## 2018-09-17 NOTE — Progress Notes (Signed)
Virtual Visit via Video   I connected with Becky Boyd on 09/17/18 at  8:00 AM EDT by a video enabled telemedicine application and verified that I am speaking with the correct person using two identifiers. Location patient: Home Location provider: [facility], Office Persons participating in the virtual visit: Pt and myself  I discussed the limitations of evaluation and management by telemedicine and the availability of in person appointments. The patient expressed understanding and agreed to proceed.  Subjective:   HPI:  COVID- pt's husband is confirmed + and has been hospitalized since 4/10.  Pt complains of vomiting, diarrhea, weakness, continued temp- 101.1, body aches.  Taking 2 Tylenol every 4-6 hrs for fever and body aches.  sxs started 1 week ago.  Pt is lying in bed, short of breath, + dry cough.  Pt reports she is able to drink water but not eat.  + HA, sore throat.  Took pt's respiratory rate while she was lying in bed- 44.  ROS: See pertinent positives and negatives per HPI.  Patient Active Problem List   Diagnosis Date Noted  . Hyperlipidemia 01/27/2017  . Bell's palsy 02/13/2016  . Maxillary sinusitis, acute 09/07/2015  . Hematuria 03/02/2015  . Left lumbar radiculopathy 11/09/2014  . Shortness of breath 11/09/2014  . Jaw pain 04/13/2014  . Pain in joint, shoulder region 10/21/2013  . Chest pain 08/31/2013  . Post-nasal drip 08/31/2013  . Abnormal brain MRI 06/17/2013  . Sjogren's syndrome (HCC) 06/17/2013  . Blurry vision 06/17/2013  . Back pain, lumbosacral 09/30/2012  . Trigger point with back pain 03/22/2011  . General medical examination 01/25/2011  . Memory loss 01/25/2011  . Joint pain 11/08/2010  . Fatigue 11/08/2010  . HYPOTHYROIDISM 05/02/2008  . HASHIMOTO'S THYROIDITIS 05/02/2008  . ANOREXIA 05/02/2008  . DYSPHAGIA UNSPECIFIED 05/02/2008  . ABDOMINAL PAIN 05/02/2008    Social History   Tobacco Use  . Smoking status: Never Smoker  .  Smokeless tobacco: Never Used  Substance Use Topics  . Alcohol use: Never    Frequency: Never    Current Outpatient Medications:  .  atorvastatin (LIPITOR) 20 MG tablet, TAKE ONE TABLET BY MOUTH ONE TIME DAILY , Disp: 90 tablet, Rfl: 0 .  baclofen (LIORESAL) 10 MG tablet, take 1 tablet by mouth 3 times daily, Disp: 90 tablet, Rfl: 2 .  DULoxetine (CYMBALTA) 60 MG capsule, Take 60 mg by mouth daily. , Disp: , Rfl:  .  levothyroxine (SYNTHROID, LEVOTHROID) 75 MCG tablet, Take 75 mcg by mouth daily.  , Disp: , Rfl:  .  meloxicam (MOBIC) 15 MG tablet, Take 15 mg by mouth daily as needed for pain (takes daily). , Disp: , Rfl:  .  predniSONE (DELTASONE) 5 MG tablet, Take 5 mg by mouth daily with breakfast., Disp: , Rfl:  .  PREVIDENT 5000 DRY MOUTH 1.1 % GEL dental gel, , Disp: , Rfl:  .  sulfaSALAzine (AZULFIDINE) 500 MG tablet, 1,000 mg 2 (two) times daily. , Disp: , Rfl:  .  HYDROcodone-acetaminophen (NORCO/VICODIN) 5-325 MG per tablet, Take 1-2 tablets by mouth every 6 (six) hours as needed. (Patient not taking: Reported on 09/17/2018), Disp: 20 tablet, Rfl: 0  Allergies  Allergen Reactions  . Plaquenil [Hydroxychloroquine Sulfate]     Skin feels like bee stings    Objective:   Pulse 100   Temp (!) 101.1 F (38.4 C)   Ht 5\' 3"  (1.6 m)   SpO2 93%   BMI 26.47 kg/m  AAOx3, obvious  SOB NCAT, EOMI No obvious CN deficits Pale Pt is not able to complete a sentence w/o pausing to catch her breath Dry, hacking cough Diarrhea x2 while on visit w/ pt   Assessment and Plan:   COVID- new.  Pt is clearly SOB and when counted, respiratory rate was 44.  At this time, it was decided to call 911 for pt as she is home alone while husband is hospitalized w/ COVID.  She is angry about this decision and does not want me to call.  Said a number of colorful words to me about the decision.  She appears to be dehydrated and I told her that with a RR of 44, she would not be able to sustain this and I am  fearful she would become unresponsive at home.  I remained on video w/ her until EMS arrived- total time on call 45 minutes.   Neena Rhymes, MD 09/17/2018

## 2018-09-17 NOTE — ED Notes (Signed)
Bed: AJ68 Expected date:  Expected time:  Means of arrival:  Comments: EMS-high risk-covid-fever/SOB

## 2018-09-18 LAB — COMPREHENSIVE METABOLIC PANEL
ALT: 24 U/L (ref 0–44)
AST: 45 U/L — ABNORMAL HIGH (ref 15–41)
Albumin: 3 g/dL — ABNORMAL LOW (ref 3.5–5.0)
Alkaline Phosphatase: 120 U/L (ref 38–126)
Anion gap: 7 (ref 5–15)
BUN: 12 mg/dL (ref 8–23)
CO2: 21 mmol/L — ABNORMAL LOW (ref 22–32)
Calcium: 7.8 mg/dL — ABNORMAL LOW (ref 8.9–10.3)
Chloride: 110 mmol/L (ref 98–111)
Creatinine, Ser: 0.79 mg/dL (ref 0.44–1.00)
GFR calc Af Amer: >60 mL/min (ref >60)
GFR calc non Af Amer: >60 mL/min (ref >60)
Glucose, Bld: 88 mg/dL (ref 70–99)
Potassium: 3.7 mmol/L (ref 3.5–5.1)
Sodium: 138 mmol/L (ref 135–145)
Total Bilirubin: 0.5 mg/dL (ref 0.3–1.2)
Total Protein: 6.1 g/dL — ABNORMAL LOW (ref 6.5–8.1)

## 2018-09-18 LAB — GLUCOSE 6 PHOSPHATE DEHYDROGENASE
G6PDH: 8 U/g{Hb} (ref 4.6–13.5)
Hemoglobin: 12.6 g/dL (ref 11.1–15.9)

## 2018-09-18 LAB — HEPATITIS B SURFACE ANTIGEN: Hepatitis B Surface Ag: NEGATIVE

## 2018-09-18 LAB — INTERLEUKIN-6, PLASMA: Interleukin-6, Plasma: 68.2 pg/mL — ABNORMAL HIGH (ref 0.0–12.2)

## 2018-09-18 LAB — HIV ANTIBODY (ROUTINE TESTING W REFLEX): HIV Screen 4th Generation wRfx: NONREACTIVE

## 2018-09-18 MED ORDER — FUROSEMIDE 10 MG/ML IJ SOLN
40.0000 mg | Freq: Every day | INTRAMUSCULAR | Status: DC
  Administered 2018-09-18 – 2018-09-24 (×7): 40 mg via INTRAVENOUS
  Filled 2018-09-18 (×7): qty 4

## 2018-09-18 MED ORDER — SODIUM CHLORIDE 0.9 % IV SOLN
500.0000 mg | INTRAVENOUS | Status: DC
  Administered 2018-09-18: 10:00:00 500 mg via INTRAVENOUS
  Filled 2018-09-18: qty 500

## 2018-09-18 MED ORDER — PANTOPRAZOLE SODIUM 40 MG PO TBEC
40.0000 mg | DELAYED_RELEASE_TABLET | Freq: Every day | ORAL | Status: DC
  Administered 2018-09-18 – 2018-09-23 (×6): 40 mg via ORAL
  Filled 2018-09-18 (×6): qty 1

## 2018-09-18 MED ORDER — SODIUM CHLORIDE 0.9 % IV SOLN
1.0000 g | INTRAVENOUS | Status: DC
  Administered 2018-09-18 – 2018-09-19 (×2): 1 g via INTRAVENOUS
  Filled 2018-09-18: qty 1
  Filled 2018-09-18: qty 10

## 2018-09-18 MED ORDER — POTASSIUM CHLORIDE CRYS ER 20 MEQ PO TBCR
40.0000 meq | EXTENDED_RELEASE_TABLET | Freq: Once | ORAL | Status: AC
  Administered 2018-09-18: 40 meq via ORAL
  Filled 2018-09-18: qty 4

## 2018-09-18 MED ORDER — MAGNESIUM SULFATE 2 GM/50ML IV SOLN
2.0000 g | Freq: Once | INTRAVENOUS | Status: AC
  Administered 2018-09-18: 11:00:00 2 g via INTRAVENOUS
  Filled 2018-09-18: qty 50

## 2018-09-18 NOTE — Progress Notes (Signed)
Called to give report to [facility], nurse will call back.

## 2018-09-18 NOTE — Progress Notes (Signed)
Patient was seen and examined-upon transfer to [facility]-complains of nausea, cough. Otherwise she was not in any distress-nurse present in the room during my evaluation.  Patient was seen earlier this morning by my partner-Dr Elgergawy-please see his notes done earlier today-continue per outlined plans.

## 2018-09-18 NOTE — Progress Notes (Signed)
Patient transferred to unit this evening, patient alert and oriented, mae's well, no c/o pain or discomfort noted. Patient c/o nausea and cough, medication given per order. Patient's daughter was notified and updated of patient's transfer to facility. Patient will like staff to call her daughter with updates instead of spouse. Patient stated spouse was just discharged from hospital.  Will continue to monitor patient.

## 2018-09-18 NOTE — Progress Notes (Signed)
Patient xfred to [facility] via [place].

## 2018-09-18 NOTE — Progress Notes (Signed)
Unit leadership spoke with point of contact family member about patients planned transfer to [facility] site today.Contact numbers for [facility] were given and  questions were answered.  

## 2018-09-18 NOTE — Progress Notes (Signed)
PROGRESS NOTE                                                                                                                                                                                                             Patient Demographics:    Becky Boyd, is a 63 y.o. female, DOB - 28-Apr-1956, ZOX:[id number]  Admit date - 09/17/2018   Admitting Physician Calvert CantorSaima Rizwan, MD  Outpatient Primary MD for the patient is Beverely Lowabori, Helane RimaKatherine E, MD  LOS - 1   Chief Complaint  Patient presents with  . Shortness of Breath  . Fever  . Cough       Brief Narrative    63 y.o. female with medical history of rheumatoid arthritis, Sjogren's, Hashimoto's thyroiditis, fibromyalgia who presents to the hospital for fever cough, nausea, vomiting, diarrhea and fatigue.  Her symptoms started about 8 days ago and have progressed.  Her husband has COVID-19 and is currently hospitalized in [place]. She had nausea and vomiting on presentation, Cindee Lameete COVID-19 test was positive in-house, her chest x-ray significant for left lung base pneumonia, she was febrile at 102, overnight she had hypoxia at 88% on room air, started on oxygen .   Subjective:    Becky HewLinda Meske today for generalized weakness, still nausea, reports she cannot have oral intake yet, denies any abdominal pain , reports cough and shortness of breath .   Assessment  & Plan :    Principal Problem:   Fever Active Problems:   Hypothyroidism   Sjogren's syndrome (HCC)   COVID-19 virus infection   Hypoxic respiratory failure/pneumonia secondary to COVID 19 infection - Fever/ nausea/ vomiting/ diarrhea/ elevated LDH and CRP, low lymphocytes -Chest x-ray significant for right lower lobe atelectasis and left lower lobe infiltrate -And has allergy to Plaquenil -Continue with IV Rocephin and azithromycin - Antitussives, vitamin C, zinc ordered -Follow-up inflammatory  markers including ferritin, CRP LDH D-dimers and  fibrinogen . -she  was hypoxic overnight 88%, requiring 2 L nasal cannula, she is desaturating 90% on 2 L nasal cannula while talking today, encouraged use incentive spirometry, out of bed to chair, will encourage to  awake prone, will stop her IV fluids and start on IV Lasix .  Hypokalemia -Repleted  Hypothyroidism -Continue Synthroid  Rheumatoid arthritis and Sjogren's syndrome (HCC)  -Continue low-dose prednisone and sulfasalazine  Hold  Lipitor due to elevated LFTs Hold meloxicam in setting of COVID 19    Code Status : Full   Family Communication  : D/W patient  Disposition Plan  : Home when stable  Barriers For Discharge : she remians  Consults  :  None  Procedures  : None  DVT Prophylaxis  :  [place] lovenox  Lab Results  Component Value Date   PLT 168 09/17/2018    Antibiotics  :    Anti-infectives (From admission, onward)   Start     Dose/Rate Route Frequency Ordered Stop   09/18/18 1000  azithromycin (ZITHROMAX) 500 mg in sodium chloride 0.9 % 250 mL IVPB     500 mg 250 mL/hr over 60 Minutes Intravenous Every 24 hours 09/18/18 0821     09/18/18 0930  cefTRIAXone (ROCEPHIN) 1 g in sodium chloride 0.9 % 100 mL IVPB     1 g 200 mL/hr over 30 Minutes Intravenous Every 24 hours 09/18/18 0821     09/17/18 1700  cefTRIAXone (ROCEPHIN) 1 g in sodium chloride 0.9 % 100 mL IVPB  Status:  Discontinued     1 g 200 mL/hr over 30 Minutes Intravenous  Once 09/17/18 1650 09/17/18 1744   09/17/18 1700  azithromycin (ZITHROMAX) 500 mg in sodium chloride 0.9 % 250 mL IVPB  Status:  Discontinued     500 mg 250 mL/hr over 60 Minutes Intravenous  Once 09/17/18 1650 09/17/18 1744        Objective:   Vitals:   09/17/18 2341 09/17/18 2342 09/18/18 0351 09/18/18 1147  BP:   108/69 129/78  Pulse:   78 98  Resp:   (!) 22 (!) 28  Temp:   98.1 F (36.7 C) 98.3 F (36.8 C)  TempSrc:   Oral Oral  SpO2: (!) 88% 94% 97% 93%  Weight:      Height:        Wt Readings from  Last 3 Encounters:  09/17/18 64.4 kg  11/21/17 67.8 kg  10/15/17 67.1 kg     Intake/Output Summary (Last 24 hours) at 09/18/2018 1317 Last data filed at 09/18/2018 1043 Gross per 24 hour  Intake 1893.6 ml  Output -  Net 1893.6 ml     Physical Exam  Awake Alert, Oriented X 3, No new F.N deficits, Normal affect, extremely frail Symmetrical Chest wall movement, Good air movement bilaterally, bibasilar rales. RRR,No Gallops,Rubs or new Murmurs, No Parasternal Heave +ve B.Sounds, Abd Soft, No tenderness,  No rebound - guarding or rigidity. No Cyanosis, Clubbing or edema, No new Rash or bruise     Data Review:    CBC Recent Labs  Lab 09/17/18 1550  WBC 5.2  HGB 13.1  HCT 40.8  PLT 168  MCV 99.0  MCH 31.8  MCHC 32.1  RDW 13.1  LYMPHSABS 0.6*  MONOABS 0.2  EOSABS 0.0  BASOSABS 0.0    Chemistries  Recent Labs  Lab 09/17/18 1550 09/17/18 1753 09/18/18 0448  NA 136  --  138  K 2.7*  --  3.7  CL 100  --  110  CO2 24  --  21*  GLUCOSE 111*  --  88  BUN 14  --  12  CREATININE 1.01*  --  0.79  CALCIUM 8.7*  --  7.8*  MG  --  1.8  --   AST 61*  --  45*  ALT 32  --  24  ALKPHOS 157*  --  120  BILITOT 0.7  --  0.5   ------------------------------------------------------------------------------------------------------------------ Recent Labs    09/17/18 1550  TRIG 101    Lab Results  Component Value Date   HGBA1C  09/09/2009    5.9 (NOTE) The ADA recommends the following therapeutic goal for glycemic control related to Hgb A1c measurement: Goal of therapy: <6.5 Hgb A1c  Reference: American Diabetes Association: Clinical Practice Recommendations 2010, Diabetes Care, 2010, 33: (Suppl  1).   ------------------------------------------------------------------------------------------------------------------ No results for input(s): TSH, T4TOTAL, T3FREE, THYROIDAB in the last 72 hours.  Invalid input(s): FREET3  ------------------------------------------------------------------------------------------------------------------ Recent Labs    09/17/18 1550  FERRITIN 593*    Coagulation profile No results for input(s): INR, PROTIME in the last 168 hours.  Recent Labs    09/17/18 1550  DDIMER 1.18*    Cardiac Enzymes No results for input(s): CKMB, TROPONINI, MYOGLOBIN in the last 168 hours.  Invalid input(s): CK ------------------------------------------------------------------------------------------------------------------ No results found for: BNP  Inpatient Medications  Scheduled Meds: . baclofen  10 mg Oral TID  . chlorpheniramine-HYDROcodone  5 mL Oral Q12H  . DULoxetine  60 mg Oral Daily  . enoxaparin (LOVENOX) injection  40 mg Subcutaneous Q24H  . feeding supplement (ENSURE ENLIVE)  237 mL Oral BID BM  . levothyroxine  75 mcg Oral QAC breakfast  . predniSONE  5 mg Oral Daily  . sulfaSALAzine  1,000 mg Oral BID  . vitamin C  500 mg Oral Daily  . zinc sulfate  220 mg Oral Daily   Continuous Infusions: . azithromycin 250 mL/hr at 09/18/18 1043  . cefTRIAXone (ROCEPHIN)  IV Stopped (09/18/18 0948)   PRN Meds:.acetaminophen, guaiFENesin-dextromethorphan, ondansetron **OR** ondansetron (ZOFRAN) IV  Micro Results Recent Results (from the past 240 hour(s))  SARS Coronavirus 2 [facility] order, Performed in [facility] hospital lab)     Status: Abnormal   Collection Time: 09/17/18  3:50 PM  Result Value Ref Range Status   SARS Coronavirus 2 POSITIVE (A) NEGATIVE Final    Comment: RESULT CALLED TO, READ BACK BY AND VERIFIED WITH: S.BINGHAM AT 1717 ON 09/17/18 BY N.THOMPSON (NOTE) If result is NEGATIVE SARS-CoV-2 target nucleic acids are NOT DETECTED. The SARS-CoV-2 RNA is generally detectable in upper and lower  respiratory specimens during the acute phase of infection. The lowest  concentration of SARS-CoV-2 viral copies this assay can detect is 250  copies / mL. A negative  result does not preclude SARS-CoV-2 infection  and should not be used as the sole basis for treatment or other  patient management decisions.  A negative result may occur with  improper specimen collection / handling, submission of specimen other  than nasopharyngeal swab, presence of viral mutation(s) within the  areas targeted by this assay, and inadequate number of viral copies  (<250 copies / mL). A negative result must be combined with clinical  observations, patient history, and epidemiological information. If result is POSITIVE SARS-CoV-2 target nucleic acids are DETEC TED. The SARS-CoV-2 RNA is generally detectable in upper and lower  respiratory specimens during the acute phase of infection.  Positive  results are indicative of active infection with SARS-CoV-2.  Clinical  correlation with patient history and other diagnostic information is  necessary to determine patient infection status.  Positive results do  not rule out bacterial infection or co-infection with other viruses. If result is PRESUMPTIVE POSTIVE SARS-CoV-2 nucleic acids MAY BE PRESENT.   A presumptive positive result was obtained on the submitted specimen  and confirmed on repeat testing.  While 2019 novel coronavirus  (SARS-CoV-2) nucleic acids may  be present in the submitted sample  additional confirmatory testing may be necessary for epidemiological  and / or clinical management purposes  to differentiate between  SARS-CoV-2 and other Sarbecovirus currently known to infect humans.  If clinically indicated additional testing with an alternate test  methodology (LAB7 453) is advised. The SARS-CoV-2 RNA is generally  detectable in upper and lower respiratory specimens during the acute  phase of infection. The expected result is Negative. Fact Sheet for Patients:  BoilerBrush.com.cy Fact Sheet for Healthcare Providers: https://pope.com/ This test is not yet  approved or cleared by the [place] FDA and has been authorized for detection and/or diagnosis of SARS-CoV-2 by FDA under an Emergency Use Authorization (EUA).  This EUA will remain in effect (meaning this test can be used) for the duration of the COVID-19 declaration under Section 564(b)(1) of the Act, 21 U.S.C. section 360bbb-3(b)(1), unless the authorization is terminated or revoked sooner. Performed at [facility], [address], [place], [place] [address]   Blood Culture (routine x 2)     Status: None (Preliminary result)   Collection Time: 09/17/18  3:50 PM  Result Value Ref Range Status   Specimen Description   Final    BLOOD RIGHT ANTECUBITAL Performed at [facility], [address] [place], [place] [address]    Special Requests   Final    BOTTLES DRAWN AEROBIC AND ANAEROBIC Blood Culture adequate volume Performed at [facility], [address] [place], [place] [address]    Culture   Final    NO GROWTH < 24 HOURS Performed at [facility], [address], [place], [place] [address]    Report Status PENDING  Incomplete  Blood Culture (routine x 2)     Status: None (Preliminary result)   Collection Time: 09/17/18  4:33 PM  Result Value Ref Range Status   Specimen Description   Final    BLOOD LEFT ANTECUBITAL Performed at [facility], [address] [place], [place] [address]    Special Requests   Final    BOTTLES DRAWN AEROBIC AND ANAEROBIC Blood Culture adequate volume Performed at [facility] [address] [place], [place] [address]    Culture   Final    NO GROWTH < 24 HOURS Performed at [facility], [address] [place], [place] [address]    Report Status PENDING  Incomplete    Radiology Reports Dg Chest Port 1 View  Result Date: 09/17/2018 CLINICAL DATA:  Shortness of breath EXAM: PORTABLE CHEST 1 VIEW COMPARISON:  Chest radiograph  and chest CT Nov 01, 2014 FINDINGS: There is focal airspace opacity in the left base. There is slight right base atelectasis. The lungs elsewhere are clear. Heart size and pulmonary vascularity are normal. No adenopathy. There is aortic atherosclerosis. There are calcified breast implants bilaterally. There is postoperative change in the lower cervical spine. IMPRESSION: Focal airspace opacity in the left base, consistent with pneumonia. Mild right base atelectasis. Stable cardiac silhouette. Calcified breast implants noted. Aortic Atherosclerosis (ICD10-I70.0). Followup PA and lateral chest radiographs recommended in 3-4 weeks following trial of antibiotic therapy to ensure resolution and exclude underlying malignancy. Electronically Signed   By: Bretta Bang III M.D.   On: 09/17/2018 16:15      Huey Bienenstock M.D on 09/18/2018 at 1:17 PM  Between 7am to 7pm - Pager - [phone]  After 7pm go to [email or web address] - password [email or web address]  [facility] -  Office  [phone]

## 2018-09-19 DIAGNOSIS — J9621 Acute and chronic respiratory failure with hypoxia: Secondary | ICD-10-CM | POA: Diagnosis present

## 2018-09-19 DIAGNOSIS — E876 Hypokalemia: Secondary | ICD-10-CM

## 2018-09-19 DIAGNOSIS — R6889 Other general symptoms and signs: Secondary | ICD-10-CM

## 2018-09-19 LAB — CBC WITH DIFFERENTIAL/PLATELET
Abs Immature Granulocytes: 0.11 10*3/uL — ABNORMAL HIGH (ref 0.00–0.07)
Basophils Absolute: 0 10*3/uL (ref 0.0–0.1)
Basophils Relative: 0 %
Eosinophils Absolute: 0 10*3/uL (ref 0.0–0.5)
Eosinophils Relative: 0 %
HCT: 37.7 % (ref 36.0–46.0)
Hemoglobin: 12.1 g/dL (ref 12.0–15.0)
Immature Granulocytes: 2 %
Lymphocytes Relative: 6 %
Lymphs Abs: 0.4 10*3/uL — ABNORMAL LOW (ref 0.7–4.0)
MCH: 32.1 pg (ref 26.0–34.0)
MCHC: 32.1 g/dL (ref 30.0–36.0)
MCV: 100 fL (ref 80.0–100.0)
Monocytes Absolute: 0.3 10*3/uL (ref 0.1–1.0)
Monocytes Relative: 4 %
Neutro Abs: 6.5 10*3/uL (ref 1.7–7.7)
Neutrophils Relative %: 88 %
Platelets: 213 10*3/uL (ref 150–400)
RBC: 3.77 MIL/uL — ABNORMAL LOW (ref 3.87–5.11)
RDW: 13.4 % (ref 11.5–15.5)
WBC: 7.3 10*3/uL (ref 4.0–10.5)
nRBC: 0 % (ref 0.0–0.2)

## 2018-09-19 LAB — COMPREHENSIVE METABOLIC PANEL
ALT: 25 U/L (ref 0–44)
ALT: 26 U/L (ref 0–44)
AST: 55 U/L — ABNORMAL HIGH (ref 15–41)
AST: 58 U/L — ABNORMAL HIGH (ref 15–41)
Albumin: 3.3 g/dL — ABNORMAL LOW (ref 3.5–5.0)
Albumin: 3.4 g/dL — ABNORMAL LOW (ref 3.5–5.0)
Alkaline Phosphatase: 142 U/L — ABNORMAL HIGH (ref 38–126)
Alkaline Phosphatase: 155 U/L — ABNORMAL HIGH (ref 38–126)
Anion gap: 11 (ref 5–15)
Anion gap: 11 (ref 5–15)
BUN: 13 mg/dL (ref 8–23)
BUN: 13 mg/dL (ref 8–23)
CO2: 27 mmol/L (ref 22–32)
CO2: 27 mmol/L (ref 22–32)
Calcium: 8.5 mg/dL — ABNORMAL LOW (ref 8.9–10.3)
Calcium: 8.4 mg/dL — ABNORMAL LOW (ref 8.9–10.3)
Chloride: 101 mmol/L (ref 98–111)
Chloride: 100 mmol/L (ref 98–111)
Creatinine, Ser: 0.91 mg/dL (ref 0.44–1.00)
Creatinine, Ser: 0.95 mg/dL (ref 0.44–1.00)
GFR calc Af Amer: >60 mL/min (ref >60)
GFR calc Af Amer: >60 mL/min (ref >60)
GFR calc non Af Amer: >60 mL/min (ref >60)
GFR calc non Af Amer: >60 mL/min (ref >60)
Glucose, Bld: 91 mg/dL (ref 70–99)
Glucose, Bld: 126 mg/dL — ABNORMAL HIGH (ref 70–99)
Potassium: 3.7 mmol/L (ref 3.5–5.1)
Potassium: 3.4 mmol/L — ABNORMAL LOW (ref 3.5–5.1)
Sodium: 139 mmol/L (ref 135–145)
Sodium: 138 mmol/L (ref 135–145)
Total Bilirubin: 0.6 mg/dL (ref 0.3–1.2)
Total Bilirubin: 0.4 mg/dL (ref 0.3–1.2)
Total Protein: 6.8 g/dL (ref 6.5–8.1)
Total Protein: 6.8 g/dL (ref 6.5–8.1)

## 2018-09-19 LAB — CBC
HCT: 36.8 % (ref 36.0–46.0)
Hemoglobin: 11.8 g/dL — ABNORMAL LOW (ref 12.0–15.0)
MCH: 32.2 pg (ref 26.0–34.0)
MCHC: 32.1 g/dL (ref 30.0–36.0)
MCV: 100.5 fL — ABNORMAL HIGH (ref 80.0–100.0)
Platelets: 193 10*3/uL (ref 150–400)
RBC: 3.66 MIL/uL — ABNORMAL LOW (ref 3.87–5.11)
RDW: 13.3 % (ref 11.5–15.5)
WBC: 6.9 10*3/uL (ref 4.0–10.5)
nRBC: 0 % (ref 0.0–0.2)

## 2018-09-19 LAB — DIFFERENTIAL
Basophils Absolute: 0 10*3/uL (ref 0.0–0.1)
Basophils Relative: 1 %
Eosinophils Absolute: 0 10*3/uL (ref 0.0–0.5)
Eosinophils Relative: 0 %
Lymphocytes Relative: 10 %
Lymphs Abs: 0.7 10*3/uL (ref 0.7–4.0)
Monocytes Absolute: 0.3 10*3/uL (ref 0.1–1.0)
Monocytes Relative: 5 %
Neutro Abs: 5.7 10*3/uL (ref 1.7–7.7)
Neutrophils Relative %: 83 %

## 2018-09-19 LAB — FIBRINOGEN: Fibrinogen: 772 mg/dL — ABNORMAL HIGH (ref 210–475)

## 2018-09-19 LAB — FERRITIN: Ferritin: 563 ng/mL — ABNORMAL HIGH (ref 11–307)

## 2018-09-19 LAB — PROTIME-INR
INR: 0.9 (ref 0.8–1.2)
Prothrombin Time: 12.4 seconds (ref 11.4–15.2)

## 2018-09-19 LAB — CK: Total CK: 111 U/L (ref 38–234)

## 2018-09-19 LAB — D-DIMER, QUANTITATIVE: D-Dimer, Quant: 1.04 ug/mL-FEU — ABNORMAL HIGH (ref 0.00–0.50)

## 2018-09-19 LAB — LACTATE DEHYDROGENASE: LDH: 480 U/L — ABNORMAL HIGH (ref 98–192)

## 2018-09-19 LAB — C-REACTIVE PROTEIN: CRP: 17.5 mg/dL — ABNORMAL HIGH (ref <1.0)

## 2018-09-19 LAB — APTT: aPTT: 33 seconds (ref 24–36)

## 2018-09-19 MED ORDER — MAGNESIUM OXIDE -MG SUPPLEMENT 400 (240 MG) MG PO TABS
400.0000 mg | ORAL_TABLET | Freq: Two times a day (BID) | ORAL | Status: AC
  Administered 2018-09-19 – 2018-09-20 (×2): 400 mg via ORAL
  Filled 2018-09-19 (×2): qty 1

## 2018-09-19 MED ORDER — TOCILIZUMAB 400 MG/20ML IV SOLN
8.0000 mg/kg | Freq: Once | INTRAVENOUS | Status: AC
  Administered 2018-09-19: 516 mg via INTRAVENOUS
  Filled 2018-09-19: qty 20

## 2018-09-19 MED ORDER — ONDANSETRON HCL 4 MG PO TABS
8.0000 mg | ORAL_TABLET | Freq: Four times a day (QID) | ORAL | Status: DC | PRN
  Administered 2018-09-19 (×2): 8 mg via ORAL
  Filled 2018-09-19 (×4): qty 2

## 2018-09-19 MED ORDER — HYDROXYCHLOROQUINE SULFATE 200 MG PO TABS: 200.0000 mg | ORAL_TABLET | Freq: Two times a day (BID) | ORAL | Status: DC

## 2018-09-19 MED ORDER — POTASSIUM CHLORIDE CRYS ER 20 MEQ PO TBCR
40.0000 meq | EXTENDED_RELEASE_TABLET | Freq: Three times a day (TID) | ORAL | Status: AC
  Administered 2018-09-19 – 2018-09-20 (×3): 40 meq via ORAL
  Filled 2018-09-19 (×3): qty 2

## 2018-09-19 MED ORDER — SODIUM CHLORIDE 0.9 % IV SOLN
INTRAVENOUS | Status: DC | PRN
  Administered 2018-09-19 (×2): 250 mL via INTRAVENOUS
  Administered 2018-09-22: 1000 mL via INTRAVENOUS

## 2018-09-19 MED ORDER — SODIUM CHLORIDE 0.9 % IV SOLN
8.0000 mg | Freq: Four times a day (QID) | INTRAVENOUS | Status: DC | PRN
  Administered 2018-09-19 – 2018-09-20 (×2): 8 mg via INTRAVENOUS
  Filled 2018-09-19 (×3): qty 4

## 2018-09-19 MED ORDER — MAGNESIUM OXIDE 400 (241.3 MG) MG PO TABS
400.0000 mg | ORAL_TABLET | Freq: Two times a day (BID) | ORAL | Status: DC
  Administered 2018-09-19: 400 mg via ORAL
  Filled 2018-09-19: qty 1

## 2018-09-19 MED ORDER — HYDROXYCHLOROQUINE SULFATE 200 MG PO TABS: 400.0000 mg | ORAL_TABLET | Freq: Two times a day (BID) | ORAL | Status: DC

## 2018-09-19 NOTE — Progress Notes (Addendum)
[facility] PROGRESS NOTE    Progress Note  Becky HewLinda Boyd  WUJ:[id number] DOB: 08/28/1955 DOA: 09/17/2018 PCP: Sheliah Hatchabori, Katherine E, MD     Brief Narrative:   Becky HewLinda Marschall is an 63 y.o. female 63 year old with past medical history of rheumatoid arthritis on immunosuppressive therapy, Sjogren, Hashimoto thyroiditis fibromyalgia presents to the hospital with fever cough nausea diarrhea and fatigue that started about 8 days prior to admission.  Her [facility] and was diagnosed with COVID-19. In the ED her COVID test was positive, chest x-ray was concerning for left lung base infiltrate. She had a fever 102 and desatted 88% on room air. She was started on oxygen.  Antibiotics: Rocephin 09/18/2018 Azithromycin 09/18/2018  Assessment/Plan:   Acute respiratory failure with hypoxia due to COVID-19 infection: She has an allergy to Plaquenil so was not empirically started on it.  Procalcitonin was less than 0.1 discontinue IV antibiotics. Started on zinc and vitamin C. Has remained afebrile LDH 380>>480 Ferritin  593 >> CRP 14.5>>17.5 Lymphopenia 0.6>>0.7 D-dimer 1.1>>1.04 Fibrinogen 179>>772 IL-6 68.2>> She is now requiring 2 L of oxygen to keep her saturations above 92%, will try to prone patient for 16 hours.  Will start Acterma. The treatment plan and use of medications and known side effects were discussed with patient/family, they were clearly explained that there is no proven definitive treatment for COVID-19 infection, any medications used here are based on published clinical articles/anecdotal data which are not peer-reviewed or randomized control trials.  Complete risks and long-term side effects are unknown, however in the best clinical judgment they seem to be of some clinical benefit rather than medical risks.  Patient agree with the treatment plan and want to receive the given medications. Her QTC was 469 repeated twelve-lead EKG tomorrow  morning.  Try to keep potassium greater than 4 magnesium greater than 2. Complete metabolic panel is pending today.  Hypokalemia: Repleted orally now improved, complete metabolic panel is pending today.  Hypothyroidism: Continue Synthroid.  Rheumatoid arthritis/Sjogren's syndrome: Continue low-dose prednisone and sulfadiazine.  Elevated LFTs: Holding statins   DVT prophylaxis: Lovenox Family Communication:none Disposition Plan/Barrier to D/C: unable to determine Code Status:     Code Status Orders  (From admission, onward)         Start     Ordered   09/17/18 1736  Full code  Continuous     09/17/18 1744        Code Status History    This patient has a current code status but no historical code status.    Advance Directive Documentation     Most Recent Value  Type of Advance Directive  Healthcare Power of Attorney  Pre-existing out of facility DNR order (yellow form or pink MOST form)  -  "MOST" Form in Place?  -        IV Access:    Peripheral IV   Procedures and diagnostic studies:   Dg Chest Port 1 View  Result Date: 09/17/2018 CLINICAL DATA:  Shortness of breath EXAM: PORTABLE CHEST 1 VIEW COMPARISON:  Chest radiograph and chest CT Nov 01, 2014 FINDINGS: There is focal airspace opacity in the left base. There is slight right base atelectasis. The lungs elsewhere are clear. Heart size and pulmonary vascularity are normal. No adenopathy. There is aortic atherosclerosis. There are calcified breast implants bilaterally. There is postoperative change in the lower cervical spine. IMPRESSION: Focal airspace opacity in the left base, consistent with pneumonia. Mild right base atelectasis. Stable  cardiac silhouette. Calcified breast implants noted. Aortic Atherosclerosis (ICD10-I70.0). Followup PA and lateral chest radiographs recommended in 3-4 weeks following trial of antibiotic therapy to ensure resolution and exclude underlying malignancy. Electronically Signed    By: Bretta Bang III M.D.   On: 09/17/2018 16:15     Medical Consultants:    None.  Anti-Infectives:     Subjective:    Becky Boyd she relates she feels worse than yesterday feels fatigue and tired with chest tightness  Objective:    Vitals:   09/18/18 2000 09/19/18 0400 09/19/18 0500 09/19/18 0800  BP: 104/77 119/73  113/68  Pulse: 97 75  82  Resp: (!) 22   (!) 35  Temp: 99.5 F (37.5 C)  98.6 F (37 C)   TempSrc: Oral  Axillary   SpO2: 94% 94%  95%  Weight:      Height:        Intake/Output Summary (Last 24 hours) at 09/19/2018 1007 Last data filed at 09/19/2018 0755 Gross per 24 hour  Intake 436.23 ml  Output -  Net 436.23 ml   Filed Weights   09/17/18 1521  Weight: 64.4 kg    Exam: General exam: In no acute distress, she feels warm Respiratory system: Air movement with crackles at bases, with labored breathing.  No accessory muscles use. Cardiovascular system: Regular rate and rhythm with positive S1-S2 no JVD. Gastrointestinal system: Positive bowel sounds soft nontender nondistended, Central nervous system: Alert and oriented. No focal neurological deficits. Extremities: No pedal edema. Skin: No rashes, lesions or ulcers Psychiatry: Judgement and insight appear normal. Mood & affect appropriate.     Data Reviewed:    Labs: Basic Metabolic Panel: Recent Labs  Lab 09/17/18 1550 09/17/18 1753 09/18/18 0448 09/19/18 0615  NA 136  --  138 139  K 2.7*  --  3.7 3.7  CL 100  --  110 101  CO2 24  --  21* 27  GLUCOSE 111*  --  88 91  BUN 14  --  12 13  CREATININE 1.01*  --  0.79 0.91  CALCIUM 8.7*  --  7.8* 8.5*  MG  --  1.8  --   --    GFR Estimated Creatinine Clearance: 56.4 mL/min (by C-G formula based on SCr of 0.91 mg/dL). Liver Function Tests: Recent Labs  Lab 09/17/18 1550 09/18/18 0448 09/19/18 0615  AST 61* 45* 55*  ALT 32 24 25  ALKPHOS 157* 120 142*  BILITOT 0.7 0.5 0.6  PROT 7.5 6.1* 6.8  ALBUMIN 3.8 3.0* 3.3*    No results for input(s): LIPASE, AMYLASE in the last 168 hours. No results for input(s): AMMONIA in the last 168 hours. Coagulation profile No results for input(s): INR, PROTIME in the last 168 hours.  CBC: Recent Labs  Lab 09/17/18 1550 09/17/18 1753 09/19/18 0615  WBC 5.2  --  6.9  NEUTROABS 4.3  --   --   HGB 13.1 12.6 11.8*  HCT 40.8  --  36.8  MCV 99.0  --  100.5*  PLT 168  --  193   Cardiac Enzymes: No results for input(s): CKTOTAL, CKMB, CKMBINDEX, TROPONINI in the last 168 hours. BNP (last 3 results) No results for input(s): PROBNP in the last 8760 hours. CBG: No results for input(s): GLUCAP in the last 168 hours. D-Dimer: Recent Labs    09/17/18 1550  DDIMER 1.18*   Hgb A1c: No results for input(s): HGBA1C in the last 72 hours. Lipid Profile: Recent Labs  09/17/18 1550  TRIG 101   Thyroid function studies: No results for input(s): TSH, T4TOTAL, T3FREE, THYROIDAB in the last 72 hours.  Invalid input(s): FREET3 Anemia work up: Recent Labs    09/17/18 1550  FERRITIN 593*   Sepsis Labs: Recent Labs  Lab 09/17/18 1550 09/19/18 0615  PROCALCITON <0.10  --   WBC 5.2 6.9  LATICACIDVEN 1.5  --    Microbiology Recent Results (from the past 240 hour(s))  SARS Coronavirus 2 [facility](Hospital order, Performed in [facility] lab)     Status: Abnormal   Collection Time: 09/17/18  3:50 PM  Result Value Ref Range Status   SARS Coronavirus 2 POSITIVE (A) NEGATIVE Final    Comment: RESULT CALLED TO, READ BACK BY AND VERIFIED WITH: S.BINGHAM AT 1717 ON 09/17/18 BY N.THOMPSON (NOTE) If result is NEGATIVE SARS-CoV-2 target nucleic acids are NOT DETECTED. The SARS-CoV-2 RNA is generally detectable in upper and lower  respiratory specimens during the acute phase of infection. The lowest  concentration of SARS-CoV-2 viral copies this assay can detect is 250  copies / mL. A negative result does not preclude SARS-CoV-2 infection  and should not be used as  the sole basis for treatment or other  patient management decisions.  A negative result may occur with  improper specimen collection / handling, submission of specimen other  than nasopharyngeal swab, presence of viral mutation(s) within the  areas targeted by this assay, and inadequate number of viral copies  (<250 copies / mL). A negative result must be combined with clinical  observations, patient history, and epidemiological information. If result is POSITIVE SARS-CoV-2 target nucleic acids are DETEC TED. The SARS-CoV-2 RNA is generally detectable in upper and lower  respiratory specimens during the acute phase of infection.  Positive  results are indicative of active infection with SARS-CoV-2.  Clinical  correlation with patient history and other diagnostic information is  necessary to determine patient infection status.  Positive results do  not rule out bacterial infection or co-infection with other viruses. If result is PRESUMPTIVE POSTIVE SARS-CoV-2 nucleic acids MAY BE PRESENT.   A presumptive positive result was obtained on the submitted specimen  and confirmed on repeat testing.  While 2019 novel coronavirus  (SARS-CoV-2) nucleic acids may be present in the submitted sample  additional confirmatory testing may be necessary for epidemiological  and / or clinical management purposes  to differentiate between  SARS-CoV-2 and other Sarbecovirus currently known to infect humans.  If clinically indicated additional testing with an alternate test  methodology (LAB7 453) is advised. The SARS-CoV-2 RNA is generally  detectable in upper and lower respiratory specimens during the acute  phase of infection. The expected result is Negative. Fact Sheet for Patients:  BoilerBrush.com.cyhttps://www.fda.gov/media/136312/download Fact Sheet for Healthcare Providers: https://pope.com/https://www.fda.gov/media/136313/download This test is not yet approved or cleared by the [place] FDA and has been authorized for  detection and/or diagnosis of SARS-CoV-2 by FDA under an Emergency Use Authorization (EUA).  This EUA will remain in effect (meaning this test can be used) for the duration of the COVID-19 declaration under Section 564(b)(1) of the Act, 21 U.S.C. section 360bbb-3(b)(1), unless the authorization is terminated or revoked sooner. Performed at [facility], [address] [place], [place] [address]   Blood Culture (routine x 2)     Status: None (Preliminary result)   Collection Time: 09/17/18  3:50 PM  Result Value Ref Range Status   Specimen Description   Final    BLOOD  RIGHT ANTECUBITAL Performed at [facility], [address], [place], [place] [address]    Special Requests   Final    BOTTLES DRAWN AEROBIC AND ANAEROBIC Blood Culture adequate volume Performed at [facility], [address] [place], [place] [address]    Culture   Final    NO GROWTH 2 DAYS Performed at [facility], [address] [place], [place] [address]    Report Status PENDING  Incomplete  Blood Culture (routine x 2)     Status: None (Preliminary result)   Collection Time: 09/17/18  4:33 PM  Result Value Ref Range Status   Specimen Description   Final    BLOOD LEFT ANTECUBITAL Performed at [facility], [address], [place], [place] [address]    Special Requests   Final    BOTTLES DRAWN AEROBIC AND ANAEROBIC Blood Culture adequate volume Performed at [facility], [address] [place], [place] [address]    Culture   Final    NO GROWTH 2 DAYS Performed at [facility], [address] [place], [place] [address]    Report Status PENDING  Incomplete     Medications:   . baclofen  10 mg Oral TID  . chlorpheniramine-HYDROcodone  5 mL Oral Q12H  . DULoxetine  60 mg Oral Daily  . enoxaparin (LOVENOX) injection  40 mg Subcutaneous Q24H  . feeding supplement (ENSURE ENLIVE)  237 mL Oral BID  BM  . furosemide  40 mg Intravenous Daily  . levothyroxine  75 mcg Oral QAC breakfast  . pantoprazole  40 mg Oral Daily  . predniSONE  5 mg Oral Daily  . sulfaSALAzine  1,000 mg Oral BID  . vitamin C  500 mg Oral Daily  . zinc sulfate  220 mg Oral Daily   Continuous Infusions: . sodium chloride 250 mL (09/19/18 0827)  . azithromycin Stopped (09/18/18 1054)  . cefTRIAXone (ROCEPHIN)  IV 1 g (09/19/18 0829)  . ondansetron (ZOFRAN) IV        LOS: 2 days   Marinda Elk  [facility]  09/19/2018, 10:07 AM

## 2018-09-19 NOTE — Progress Notes (Signed)
The pt's Daughter, Kimberle Patalano, called and requested an update from the MD. At 1833, Marinda Elk, MD was paged regarding the Daughter's request.

## 2018-09-19 NOTE — Progress Notes (Signed)
At 1138, David Stall, A., MD was paged regarding the pt's c/o continued headache and nausea after being medicated for both. The pt is requesting additional PRN medications.   Also, the MD was made aware of the pt's O2 sat dropping to 90 % on 2L. The pt's respirations are fluctuating between mid 20s to mid 30s. The pt is SOB at rest. I will continue to monitor.

## 2018-09-19 NOTE — Progress Notes (Signed)
At 0810, David Stall, MD was paged regarding the pt's request for a different nausea medication. Per the pt, the Zofran is not working.

## 2018-09-20 DIAGNOSIS — J189 Pneumonia, unspecified organism: Secondary | ICD-10-CM

## 2018-09-20 LAB — COMPREHENSIVE METABOLIC PANEL
ALT: 24 U/L (ref 0–44)
AST: 51 U/L — ABNORMAL HIGH (ref 15–41)
Albumin: 3.6 g/dL (ref 3.5–5.0)
Alkaline Phosphatase: 142 U/L — ABNORMAL HIGH (ref 38–126)
Anion gap: 9 (ref 5–15)
BUN: 18 mg/dL (ref 8–23)
CO2: 29 mmol/L (ref 22–32)
Calcium: 8.9 mg/dL (ref 8.9–10.3)
Chloride: 102 mmol/L (ref 98–111)
Creatinine, Ser: 0.97 mg/dL (ref 0.44–1.00)
GFR calc Af Amer: >60 mL/min (ref >60)
GFR calc non Af Amer: >60 mL/min (ref >60)
Glucose, Bld: 125 mg/dL — ABNORMAL HIGH (ref 70–99)
Potassium: 4.8 mmol/L (ref 3.5–5.1)
Sodium: 140 mmol/L (ref 135–145)
Total Bilirubin: 0.6 mg/dL (ref 0.3–1.2)
Total Protein: 7.2 g/dL (ref 6.5–8.1)

## 2018-09-20 LAB — CBC WITH DIFFERENTIAL/PLATELET
Abs Immature Granulocytes: 0.1 10*3/uL — ABNORMAL HIGH (ref 0.00–0.07)
Basophils Absolute: 0 10*3/uL (ref 0.0–0.1)
Basophils Relative: 1 %
Eosinophils Absolute: 0 10*3/uL (ref 0.0–0.5)
Eosinophils Relative: 1 %
HCT: 38.4 % (ref 36.0–46.0)
Hemoglobin: 11.7 g/dL — ABNORMAL LOW (ref 12.0–15.0)
Immature Granulocytes: 3 %
Lymphocytes Relative: 15 %
Lymphs Abs: 0.6 10*3/uL — ABNORMAL LOW (ref 0.7–4.0)
MCH: 31.2 pg (ref 26.0–34.0)
MCHC: 30.5 g/dL (ref 30.0–36.0)
MCV: 102.4 fL — ABNORMAL HIGH (ref 80.0–100.0)
Monocytes Absolute: 0.1 10*3/uL (ref 0.1–1.0)
Monocytes Relative: 2 %
Neutro Abs: 3.2 10*3/uL (ref 1.7–7.7)
Neutrophils Relative %: 78 %
Platelets: 228 10*3/uL (ref 150–400)
RBC: 3.75 MIL/uL — ABNORMAL LOW (ref 3.87–5.11)
RDW: 13.4 % (ref 11.5–15.5)
WBC Morphology: INCREASED
WBC: 4 10*3/uL (ref 4.0–10.5)
nRBC: 0.5 % — ABNORMAL HIGH (ref 0.0–0.2)

## 2018-09-20 LAB — LACTATE DEHYDROGENASE: LDH: 496 U/L — ABNORMAL HIGH (ref 98–192)

## 2018-09-20 LAB — D-DIMER, QUANTITATIVE: D-Dimer, Quant: 1.25 ug/mL-FEU — ABNORMAL HIGH (ref 0.00–0.50)

## 2018-09-20 LAB — C-REACTIVE PROTEIN: CRP: 16.4 mg/dL — ABNORMAL HIGH (ref <1.0)

## 2018-09-20 LAB — FIBRINOGEN: Fibrinogen: 777 mg/dL — ABNORMAL HIGH (ref 210–475)

## 2018-09-20 LAB — FERRITIN: Ferritin: 608 ng/mL — ABNORMAL HIGH (ref 11–307)

## 2018-09-20 LAB — CK: Total CK: 75 U/L (ref 38–234)

## 2018-09-20 MED ORDER — PROMETHAZINE HCL 25 MG/ML IJ SOLN
6.2500 mg | Freq: Four times a day (QID) | INTRAMUSCULAR | Status: DC | PRN
  Administered 2018-09-20 – 2018-09-26 (×4): 6.25 mg via INTRAVENOUS
  Filled 2018-09-20 (×4): qty 1

## 2018-09-20 MED ORDER — ATORVASTATIN CALCIUM 10 MG PO TABS
20.0000 mg | ORAL_TABLET | Freq: Every day | ORAL | Status: DC
  Administered 2018-09-20 – 2018-10-01 (×10): 20 mg via ORAL
  Filled 2018-09-20 (×12): qty 2

## 2018-09-20 MED ORDER — DIPHENOXYLATE-ATROPINE 2.5-0.025 MG PO TABS: 2.0000 | ORAL_TABLET | Freq: Four times a day (QID) | ORAL | Status: DC | PRN

## 2018-09-20 MED ORDER — METOCLOPRAMIDE HCL 5 MG/ML IJ SOLN
5.0000 mg | Freq: Two times a day (BID) | INTRAMUSCULAR | Status: DC | PRN
  Administered 2018-09-20: 5 mg via INTRAVENOUS
  Filled 2018-09-20 (×2): qty 1

## 2018-09-20 NOTE — Progress Notes (Addendum)
[facility] PROGRESS NOTE    Progress Note  Becky Boyd  ZPH:[id number] DOB: [birth year] DOA: 09/17/2018 PCP: Sheliah Hatch, MD     Brief Narrative:   Becky Boyd is an 63 y.o. female 63 year old with past medical history of rheumatoid arthritis on immunosuppressive therapy, Sjogren, Hashimoto thyroiditis fibromyalgia presents to the hospital with fever cough nausea diarrhea and fatigue that started about 8 days prior to admission.  Her husband was hospitalized at [facility] and was diagnosed with COVID-19. In the ED her COVID test was positive, chest x-ray was concerning for left lung base infiltrate. She had a fever 102 and desatted 88% on room air. She was started on oxygen.  Antibiotics: Rocephin 09/18/2018 Azithromycin 09/18/2018  Assessment/Plan:   Acute respiratory failure with hypoxia due to COVID-19 infection: She has an allergy to Plaquenil so was not empirically started on it.  Procalcitonin was less than 0.1 discontinue IV antibiotics. Cont. on zinc and vitamin C. Has remained afebrile COVID-19 Markers: LDH 380>>480>496 Ferritin  593 >> 563>608 CRP 14.5>>17.5>16.4 Lymphopenia 0.6>>0.7 D-dimer 1.1>>1.04>1.25 Fibrinogen 179>>772>777 IL-6 68.2>> PT and INR 12.4/0.9, APTT 33 She is now requiring 2 L of oxygen to keep her saturations above 92%, will try to prone patient for 16 hours.   She was given Acterma on 09/19/2018. The treatment plan and use of medications and known side effects were discussed with patient/family, they were clearly explained that there is no proven definitive treatment for COVID-19 infection, any medications used here are based on published clinical articles/anecdotal data which are not peer-reviewed or randomized control trials.  Complete risks and long-term side effects are unknown, however in the best clinical judgment they seem to be of some clinical benefit rather than medical risks.  Patient agree with the treatment plan and want  to receive the given medications. Lead EKG showed a QTC of 449. Complete metabolic panel from yesterday showed LFTs are improving.  Hypokalemia: Repleted orally now Resolved.  Hypothyroidism: Continue Synthroid.  Rheumatoid arthritis/Sjogren's syndrome: Continue low-dose prednisone and sulfadiazine.  Elevated LFTs: Resume statins   DVT prophylaxis: Lovenox Family Communication:none Disposition Plan/Barrier to D/C: unable to determine Code Status:     Code Status Orders  (From admission, onward)         Start     Ordered   09/17/18 1736  Full code  Continuous     09/17/18 1744        Code Status History    This patient has a current code status but no historical code status.    Advance Directive Documentation     Most Recent Value  Type of Advance Directive  Healthcare Power of Attorney  Pre-existing out of facility DNR order (yellow form or pink MOST form)  --  "MOST" Form in Place?  --        IV Access:    Peripheral IV   Procedures and diagnostic studies:   No results found.   Medical Consultants:    None.  Anti-Infectives:  None   Subjective:    Becky Boyd relates she feels better than yesterday not as fatigued, her dyspnea she relates is somewhat improved.  Objective:    Vitals:   09/19/18 2300 09/20/18 0000 09/20/18 0338 09/20/18 0400  BP:  111/68 105/80 119/72  Pulse: 80 73  73  Resp: (!) 23 (!) 24  (!) 33  Temp:   97.6 F (36.4 C)   TempSrc:   Oral   SpO2: 95% 95%  95%  Weight:  Height:        Intake/Output Summary (Last 24 hours) at 09/20/2018 0737 Last data filed at 09/20/2018 0300 Gross per 24 hour  Intake 987.89 ml  Output 2 ml  Net 985.89 ml   Filed Weights   09/17/18 1521  Weight: 64.4 kg    Exam: General exam: In no acute distress Respiratory system: Good air movement with crackles at bases. Cardiovascular system: Rate and rhythm with positive S1-S2 no JVD Gastrointestinal system: Bowel sounds  soft nontender nondistended. Central nervous system: Awake alert and oriented x3 nonfocal. Extremities: No pedal edema. Skin: No rashes, lesions or ulcers Psychiatry: Mood and affect are appropriate, judgment and insight appear normal.    Data Reviewed:    Labs: Basic Metabolic Panel: Recent Labs  Lab 09/17/18 1550 09/17/18 1753 09/18/18 0448 09/19/18 0615 09/19/18 1415  NA 136  --  138 139 138  K 2.7*  --  3.7 3.7 3.4*  CL 100  --  110 101 100  CO2 24  --  21* 27 27  GLUCOSE 111*  --  88 91 126*  BUN 14  --  12 13 13   CREATININE 1.01*  --  0.79 0.91 0.95  CALCIUM 8.7*  --  7.8* 8.5* 8.4*  MG  --  1.8  --   --   --    GFR Estimated Creatinine Clearance: 54 mL/min (by C-G formula based on SCr of 0.95 mg/dL). Liver Function Tests: Recent Labs  Lab 09/17/18 1550 09/18/18 0448 09/19/18 0615 09/19/18 1415  AST 61* 45* 55* 58*  ALT 32 24 25 26   ALKPHOS 157* 120 142* 155*  BILITOT 0.7 0.5 0.6 0.4  PROT 7.5 6.1* 6.8 6.8  ALBUMIN 3.8 3.0* 3.3* 3.4*   No results for input(s): LIPASE, AMYLASE in the last 168 hours. No results for input(s): AMMONIA in the last 168 hours. Coagulation profile Recent Labs  Lab 09/19/18 1415  INR 0.9    CBC: Recent Labs  Lab 09/17/18 1550 09/17/18 1753 09/19/18 0615 09/19/18 1415  WBC 5.2  --  6.9 7.3  NEUTROABS 4.3  --  5.7 6.5  HGB 13.1 12.6 11.8* 12.1  HCT 40.8  --  36.8 37.7  MCV 99.0  --  100.5* 100.0  PLT 168  --  193 213   Cardiac Enzymes: Recent Labs  Lab 09/19/18 1415  CKTOTAL 111   BNP (last 3 results) No results for input(s): PROBNP in the last 8760 hours. CBG: No results for input(s): GLUCAP in the last 168 hours. D-Dimer: Recent Labs    09/17/18 1550 09/19/18 0615  DDIMER 1.18* 1.04*   Hgb A1c: No results for input(s): HGBA1C in the last 72 hours. Lipid Profile: Recent Labs    09/17/18 1550  TRIG 101   Thyroid function studies: No results for input(s): TSH, T4TOTAL, T3FREE, THYROIDAB in the  last 72 hours.  Invalid input(s): FREET3 Anemia work up: Recent Labs    09/17/18 1550 09/19/18 1415  FERRITIN 593* 563*   Sepsis Labs: Recent Labs  Lab 09/17/18 1550 09/19/18 0615 09/19/18 1415  PROCALCITON <0.10  --   --   WBC 5.2 6.9 7.3  LATICACIDVEN 1.5  --   --    Microbiology Recent Results (from the past 240 hour(s))  SARS Coronavirus 2 [facility](Hospital order, Performed in [facility] hospital lab)     Status: Abnormal   Collection Time: 09/17/18  3:50 PM  Result Value Ref Range Status   SARS Coronavirus 2 POSITIVE (A) NEGATIVE  Final    Comment: RESULT CALLED TO, READ BACK BY AND VERIFIED WITH: S.BINGHAM AT 1717 ON 09/17/18 BY N.THOMPSON (NOTE) If result is NEGATIVE SARS-CoV-2 target nucleic acids are NOT DETECTED. The SARS-CoV-2 RNA is generally detectable in upper and lower  respiratory specimens during the acute phase of infection. The lowest  concentration of SARS-CoV-2 viral copies this assay can detect is 250  copies / mL. A negative result does not preclude SARS-CoV-2 infection  and should not be used as the sole basis for treatment or other  patient management decisions.  A negative result may occur with  improper specimen collection / handling, submission of specimen other  than nasopharyngeal swab, presence of viral mutation(s) within the  areas targeted by this assay, and inadequate number of viral copies  (<250 copies / mL). A negative result must be combined with clinical  observations, patient history, and epidemiological information. If result is POSITIVE SARS-CoV-2 target nucleic acids are DETEC TED. The SARS-CoV-2 RNA is generally detectable in upper and lower  respiratory specimens during the acute phase of infection.  Positive  results are indicative of active infection with SARS-CoV-2.  Clinical  correlation with patient history and other diagnostic information is  necessary to determine patient infection status.  Positive results do  not rule out  bacterial infection or co-infection with other viruses. If result is PRESUMPTIVE POSTIVE SARS-CoV-2 nucleic acids MAY BE PRESENT.   A presumptive positive result was obtained on the submitted specimen  and confirmed on repeat testing.  While 2019 novel coronavirus  (SARS-CoV-2) nucleic acids may be present in the submitted sample  additional confirmatory testing may be necessary for epidemiological  and / or clinical management purposes  to differentiate between  SARS-CoV-2 and other Sarbecovirus currently known to infect humans.  If clinically indicated additional testing with an alternate test  methodology (LAB7 453) is advised. The SARS-CoV-2 RNA is generally  detectable in upper and lower respiratory specimens during the acute  phase of infection. The expected result is Negative. Fact Sheet for Patients:  BoilerBrush.com.cyhttps://www.fda.gov/media/136312/download Fact Sheet for Healthcare Providers: https://pope.com/https://www.fda.gov/media/136313/download This test is not yet approved or cleared by the [place] FDA and has been authorized for detection and/or diagnosis of SARS-CoV-2 by FDA under an Emergency Use Authorization (EUA).  This EUA will remain in effect (meaning this test can be used) for the duration of the COVID-19 declaration under Section 564(b)(1) of the Act, 21 U.S.C. section 360bbb-3(b)(1), unless the authorization is terminated or revoked sooner. Performed at [facility], [address] [place], [place] [address]   Blood Culture (routine x 2)     Status: None (Preliminary result)   Collection Time: 09/17/18  3:50 PM  Result Value Ref Range Status   Specimen Description   Final    BLOOD RIGHT ANTECUBITAL Performed at [facility], [address] [place], [place] [address]    Special Requests   Final    BOTTLES DRAWN AEROBIC AND ANAEROBIC Blood Culture adequate volume Performed at [facility], [address]  [place], [place] [address]    Culture   Final    NO GROWTH 2 DAYS Performed at [facility], [address] [place], [place] [address]    Report Status PENDING  Incomplete  Blood Culture (routine x 2)     Status: None (Preliminary result)   Collection Time: 09/17/18  4:33 PM  Result Value Ref Range Status   Specimen Description   Final  BLOOD LEFT ANTECUBITAL Performed at [facility], [address], [place], [place] [address]    Special Requests   Final    BOTTLES DRAWN AEROBIC AND ANAEROBIC Blood Culture adequate volume Performed at [facility], [address], [place], [place] [address]    Culture   Final    NO GROWTH 2 DAYS Performed at [facility], [address] [place], [place] [address]    Report Status PENDING  Incomplete     Medications:    baclofen  10 mg Oral TID   chlorpheniramine-HYDROcodone  5 mL Oral Q12H   DULoxetine  60 mg Oral Daily   enoxaparin (LOVENOX) injection  40 mg Subcutaneous Q24H   feeding supplement (ENSURE ENLIVE)  237 mL Oral BID BM   furosemide  40 mg Intravenous Daily   levothyroxine  75 mcg Oral QAC breakfast   Magnesium Oxide  400 mg Oral BID   pantoprazole  40 mg Oral Daily   potassium chloride  40 mEq Oral TID   predniSONE  5 mg Oral Daily   sulfaSALAzine  1,000 mg Oral BID   vitamin C  500 mg Oral Daily   zinc sulfate  220 mg Oral Daily   Continuous Infusions:  sodium chloride Stopped (09/19/18 1540)   ondansetron (ZOFRAN) IV 8 mg (09/20/18 0641)      LOS: 3 days   Marinda Elk  [facility]  09/20/2018, 7:37 AM

## 2018-09-21 ENCOUNTER — Inpatient Hospital Stay (HOSPITAL_COMMUNITY): Payer: Medicare Other

## 2018-09-21 LAB — CBC WITH DIFFERENTIAL/PLATELET
Abs Immature Granulocytes: 0.22 10*3/uL — ABNORMAL HIGH (ref 0.00–0.07)
Basophils Absolute: 0 10*3/uL (ref 0.0–0.1)
Basophils Relative: 0 %
Eosinophils Absolute: 0 10*3/uL (ref 0.0–0.5)
Eosinophils Relative: 0 %
HCT: 42.7 % (ref 36.0–46.0)
Hemoglobin: 13.1 g/dL (ref 12.0–15.0)
Immature Granulocytes: 4 %
Lymphocytes Relative: 11 %
Lymphs Abs: 0.6 10*3/uL — ABNORMAL LOW (ref 0.7–4.0)
MCH: 31.3 pg (ref 26.0–34.0)
MCHC: 30.7 g/dL (ref 30.0–36.0)
MCV: 102.2 fL — ABNORMAL HIGH (ref 80.0–100.0)
Monocytes Absolute: 0.2 10*3/uL (ref 0.1–1.0)
Monocytes Relative: 3 %
Neutro Abs: 4.9 10*3/uL (ref 1.7–7.7)
Neutrophils Relative %: 82 %
Platelets: 325 10*3/uL (ref 150–400)
RBC: 4.18 MIL/uL (ref 3.87–5.11)
RDW: 13.4 % (ref 11.5–15.5)
WBC: 6 10*3/uL (ref 4.0–10.5)
nRBC: 0 % (ref 0.0–0.2)

## 2018-09-21 LAB — POCT I-STAT 7, (LYTES, BLD GAS, ICA,H+H)
Acid-Base Excess: 5 mmol/L — ABNORMAL HIGH (ref 0.0–2.0)
Bicarbonate: 28.9 mmol/L — ABNORMAL HIGH (ref 20.0–28.0)
Calcium, Ion: 1.18 mmol/L (ref 1.15–1.40)
HCT: 44 % (ref 36.0–46.0)
Hemoglobin: 15 g/dL (ref 12.0–15.0)
O2 Saturation: 87 %
Patient temperature: 99.1
Potassium: 4.5 mmol/L (ref 3.5–5.1)
Sodium: 138 mmol/L (ref 135–145)
TCO2: 30 mmol/L (ref 22–32)
pCO2 arterial: 40.5 mmHg (ref 32.0–48.0)
pH, Arterial: 7.463 — ABNORMAL HIGH (ref 7.350–7.450)
pO2, Arterial: 51 mmHg — ABNORMAL LOW (ref 83.0–108.0)

## 2018-09-21 LAB — COMPREHENSIVE METABOLIC PANEL
ALT: 21 U/L (ref 0–44)
AST: 46 U/L — ABNORMAL HIGH (ref 15–41)
Albumin: 3.6 g/dL (ref 3.5–5.0)
Alkaline Phosphatase: 142 U/L — ABNORMAL HIGH (ref 38–126)
Anion gap: 12 (ref 5–15)
BUN: 19 mg/dL (ref 8–23)
CO2: 27 mmol/L (ref 22–32)
Calcium: 9.3 mg/dL (ref 8.9–10.3)
Chloride: 102 mmol/L (ref 98–111)
Creatinine, Ser: 0.99 mg/dL (ref 0.44–1.00)
GFR calc Af Amer: >60 mL/min (ref >60)
GFR calc non Af Amer: >60 mL/min (ref >60)
Glucose, Bld: 102 mg/dL — ABNORMAL HIGH (ref 70–99)
Potassium: 4.7 mmol/L (ref 3.5–5.1)
Sodium: 141 mmol/L (ref 135–145)
Total Bilirubin: 0.4 mg/dL (ref 0.3–1.2)
Total Protein: 6.7 g/dL (ref 6.5–8.1)

## 2018-09-21 LAB — HEPATITIS PANEL, ACUTE
HCV Ab: <0.1 s/co ratio (ref 0.0–0.9)
Hep A IgM: NEGATIVE
Hep B C IgM: NEGATIVE
Hepatitis B Surface Ag: NEGATIVE

## 2018-09-21 LAB — D-DIMER, QUANTITATIVE: D-Dimer, Quant: 1.48 ug/mL-FEU — ABNORMAL HIGH (ref 0.00–0.50)

## 2018-09-21 LAB — LACTATE DEHYDROGENASE: LDH: 556 U/L — ABNORMAL HIGH (ref 98–192)

## 2018-09-21 LAB — FERRITIN: Ferritin: 636 ng/mL — ABNORMAL HIGH (ref 11–307)

## 2018-09-21 LAB — FIBRINOGEN: Fibrinogen: 785 mg/dL — ABNORMAL HIGH (ref 210–475)

## 2018-09-21 LAB — C-REACTIVE PROTEIN: CRP: 8.6 mg/dL — ABNORMAL HIGH (ref <1.0)

## 2018-09-21 LAB — CK: Total CK: 41 U/L (ref 38–234)

## 2018-09-21 LAB — INTERLEUKIN-6, PLASMA: Interleukin-6, Plasma: 80.1 pg/mL — ABNORMAL HIGH (ref 0.0–12.2)

## 2018-09-21 MED ORDER — TOCILIZUMAB 400 MG/20ML IV SOLN
8.0000 mg/kg | Freq: Once | INTRAVENOUS | Status: AC
  Administered 2018-09-21: 516 mg via INTRAVENOUS
  Filled 2018-09-21: qty 25.8

## 2018-09-21 NOTE — Progress Notes (Signed)
When assessing patient this morning pt was laying supine and oxygen sats were 88, pts o2 on 5L.  Encouraged IS and side lying.  Pt agreed. Repositioned to side lying and pts sats increased to 96 on 5L. Informed MD.

## 2018-09-21 NOTE — Progress Notes (Signed)
Spoke with pts daughter [name]. Update given

## 2018-09-21 NOTE — Plan of Care (Signed)

## 2018-09-21 NOTE — Progress Notes (Signed)
Pt required oxygen level  to be turned up to 6L on nasal cannula for desaturation with activity. Pt currently has oxygen saturation of 93%. Pt also denies shortness of breath, and denies difficulty breathing. MD oncall for [facility] notified (Purohit).  RN will continue to monitor pt, and wean oxygen down as tolerated.

## 2018-09-21 NOTE — Progress Notes (Addendum)
PROGRESS NOTE  Becky Boyd ZOX:[id number] DOB: Sep 23, 1955 DOA: 09/17/2018 PCP: Sheliah Hatch, MD   LOS: 4 days   Brief Narrative / Interim history: 63 year old female with history of rheumatoid arthritis on chronic immunosuppressive therapy, Sjogren, Hashimoto thyroiditis, fibromyalgia who was admitted to the hospital on 09/17/2018 with fever, cough, nausea diarrhea and fatigue that started about 8 days prior to admission.  Her husband was hospitalized at [facility] and diagnosed with COVID-19.  In our ED she was positive for coronavirus, chest x-ray was concerning for left left infiltrate, she had a fever of 102 and developed hypoxic respiratory failure requiring oxygen supplementation.  Approximate date Covid symptoms started: 09/09/2018.  Today day 12  Patient has already finished a course of antibiotics with ceftriaxone and azithromycin on 4/17.   Subjective: She continues to feel very weak and fatigued, is able to do a few steps to the bedside commode and back but desats very quickly.  She denies worsening shortness of breath and feels about the same.  Denies any chest pain, no abdominal pain, no nausea or vomiting  Assessment & Plan: Principal Problem:   Fever Active Problems:   Hypothyroidism   Sjogren's syndrome (HCC)   COVID-19 virus infection   Acute on chronic respiratory failure with hypoxia (HCC)   Hypokalemia   Principal Problem Acute Hypoxic Resp. Failure due to Acute Covid 19 Viral Illness during the ongoing 2020 Covid 19 Pandemic   Treatment so far -Patient has an allergy to Plaquenil so she was not empirically started on it.  She has received ceftriaxone and azithromycin for presumed bacterial involvement given lobar pneumonia on initial chest x-ray. -Has received Actemra on 09/19/2018 and repeat dose 4/20 2020 -Continue proning as able -Given slightly worsening respiratory status will repeat a chest x-ray and an ABG  Treatment plan and use of  medications unknown side effects were discussed with the patient and the family, clearly explained that there is no proven definitive treatment for COVID-19 infection, any medications UTI based on public clinical articles/anecdotal data which are now peer-reviewed no randomized control trials.  Complete risk and long-term side effects are unknown, however in the best clinical judgment this seems to be of some clinical benefit rather than medical risks.  Patient and family agree with treatment plan and want to receive the given medications  COVID-19 markers -LDH [phone] -Ferritin [phone] -CRP 14.5-17.5-16.4-8.6 -QTc 449 on 4/19 -D-dimer 1.1-1.04-1.25-1.48 -Fibrinogen [phone] -IL-6 68.2  Oxygenation requirements -2 L on 4/18, 4 L on 4/19, 6 L on 4/20  Fluid status -Currently receiving Lasix  Rheumatoid arthritis/Sjogren's syndrome -Continue low-dose prednisone and sulfadiazine  Hypothyroidism -Continue Synthroid  Hypokalemia -Replete orally, resolved,  Elevated LFTs -Continue to monitor   Scheduled Meds: . atorvastatin  20 mg Oral Daily  . baclofen  10 mg Oral TID  . chlorpheniramine-HYDROcodone  5 mL Oral Q12H  . DULoxetine  60 mg Oral Daily  . enoxaparin (LOVENOX) injection  40 mg Subcutaneous Q24H  . feeding supplement (ENSURE ENLIVE)  237 mL Oral BID BM  . furosemide  40 mg Intravenous Daily  . levothyroxine  75 mcg Oral QAC breakfast  . pantoprazole  40 mg Oral Daily  . predniSONE  5 mg Oral Daily  . sulfaSALAzine  1,000 mg Oral BID  . vitamin C  500 mg Oral Daily  . zinc sulfate  220 mg Oral Daily   Continuous Infusions: . sodium chloride Stopped (09/19/18 1540)  . ondansetron [facility]) IV 8 mg (09/20/18 0641)  .  tocilizumab (ACTEMRA) IV 516 mg (09/21/18 1014)   PRN Meds:.sodium chloride, acetaminophen, diphenoxylate-atropine, guaiFENesin-dextromethorphan, ondansetron **OR** ondansetron (ZOFRAN) IV, promethazine  DVT prophylaxis:  Lovenox Code Status: Full code Family Communication: Discussed with daughter Ziaira Hake at [phone] over the phone Disposition Plan: To be determined  Consultants:   None  Procedures:   None   Antimicrobials:  Finished ceftriaxone azithromycin on 4/17  Objective: Vitals:   09/21/18 0622 09/21/18 0623 09/21/18 0900 09/21/18 0936  BP:   103/63 103/63  Pulse: 96 95    Resp: (!) 28 (!) 25    Temp:      TempSrc:      SpO2: 93% 94% (!) 88%   Weight:      Height:        Intake/Output Summary (Last 24 hours) at 09/21/2018 1024 Last data filed at 09/21/2018 0600 Gross per 24 hour  Intake 720 ml  Output -  Net 720 ml   Filed Weights   09/17/18 1521  Weight: 64.4 kg    Examination: Constitutional: Appears weak but does not appear to be in significant distress Eyes: PERRL, lids and conjunctivae normal, no scleral icterus ENMT: Mucous membranes are moist.  Neck: normal, supple Respiratory: Decreased breath sounds, mild rhonchi at the bases, no wheezing heard Cardiovascular: Regular rate and rhythm, no murmurs / rubs / gallops. No LE edema. Abdomen: no tenderness. Bowel sounds positive.  Musculoskeletal: no clubbing / cyanosis. Skin: no rashes Neurologic: No focal deficits  Data Reviewed: I have independently reviewed following labs and imaging studies   CBC: Recent Labs  Lab 09/17/18 1550 09/17/18 1753 09/19/18 0615 09/19/18 1415 09/20/18 0620 09/21/18 0510  WBC 5.2  --  6.9 7.3 4.0 6.0  NEUTROABS 4.3  --  5.7 6.5 3.2 4.9  HGB 13.1 12.6 11.8* 12.1 11.7* 13.1  HCT 40.8  --  36.8 37.7 38.4 42.7  MCV 99.0  --  100.5* 100.0 102.4* 102.2*  PLT 168  --  193 213 228 325   Basic Metabolic Panel: Recent Labs  Lab 09/17/18 1753 09/18/18 0448 09/19/18 0615 09/19/18 1415 09/20/18 0620 09/21/18 0510  NA  --  138 139 138 140 141  K  --  3.7 3.7 3.4* 4.8 4.7  CL  --  110 101 100 102 102  CO2  --  21* 27 27 29 27   GLUCOSE  --  88 91 126* 125* 102*  BUN   --  12 13 13 18 19   CREATININE  --  0.79 0.91 0.95 0.97 0.99  CALCIUM  --  7.8* 8.5* 8.4* 8.9 9.3  MG 1.8  --   --   --   --   --    GFR: Estimated Creatinine Clearance: 51.8 mL/min (by C-G formula based on SCr of 0.99 mg/dL). Liver Function Tests: Recent Labs  Lab 09/18/18 0448 09/19/18 0615 09/19/18 1415 09/20/18 0620 09/21/18 0510  AST 45* 55* 58* 51* 46*  ALT 24 25 26 24 21   ALKPHOS 120 142* 155* 142* 142*  BILITOT 0.5 0.6 0.4 0.6 0.4  PROT 6.1* 6.8 6.8 7.2 6.7  ALBUMIN 3.0* 3.3* 3.4* 3.6 3.6   No results for input(s): LIPASE, AMYLASE in the last 168 hours. No results for input(s): AMMONIA in the last 168 hours. Coagulation Profile: Recent Labs  Lab 09/19/18 1415  INR 0.9   Cardiac Enzymes: Recent Labs  Lab 09/19/18 1415 09/20/18 0620 09/21/18 0510  CKTOTAL 111 75 41   BNP (last 3 results) No results for  input(s): PROBNP in the last 8760 hours. HbA1C: No results for input(s): HGBA1C in the last 72 hours. CBG: No results for input(s): GLUCAP in the last 168 hours. Lipid Profile: No results for input(s): CHOL, HDL, LDLCALC, TRIG, CHOLHDL, LDLDIRECT in the last 72 hours. Thyroid Function Tests: No results for input(s): TSH, T4TOTAL, FREET4, T3FREE, THYROIDAB in the last 72 hours. Anemia Panel: Recent Labs    09/20/18 0620 09/21/18 0510  FERRITIN 608* 636*   Urine analysis:    Component Value Date/Time   COLORURINE YELLOW 09/05/2009 2343   APPEARANCEUR CLOUDY (A) 09/05/2009 2343   LABSPEC 1.027 09/05/2009 2343   PHURINE 6.0 09/05/2009 2343   GLUCOSEU 100 (A) 09/05/2009 2343   HGBUR MODERATE (A) 09/05/2009 2343   BILIRUBINUR Negative 01/27/2017 1200   KETONESUR NEGATIVE 09/05/2009 2343   PROTEINUR + 01/27/2017 1200   PROTEINUR NEGATIVE 09/05/2009 2343   UROBILINOGEN 0.2 01/27/2017 1200   UROBILINOGEN 0.2 09/05/2009 2343   NITRITE + 01/27/2017 1200   NITRITE NEGATIVE 09/05/2009 2343   LEUKOCYTESUR Small (1+) (A) 01/27/2017 1200   Sepsis Labs:  Invalid input(s): PROCALCITONIN, LACTICIDVEN  Recent Results (from the past 240 hour(s))  SARS Coronavirus 2 [facility](Hospital order, Performed in [facility] lab)     Status: Abnormal   Collection Time: 09/17/18  3:50 PM  Result Value Ref Range Status   SARS Coronavirus 2 POSITIVE (A) NEGATIVE Final    Comment: RESULT CALLED TO, READ BACK BY AND VERIFIED WITH: S.BINGHAM AT 1717 ON 09/17/18 BY N.THOMPSON (NOTE) If result is NEGATIVE SARS-CoV-2 target nucleic acids are NOT DETECTED. The SARS-CoV-2 RNA is generally detectable in upper and lower  respiratory specimens during the acute phase of infection. The lowest  concentration of SARS-CoV-2 viral copies this assay can detect is 250  copies / mL. A negative result does not preclude SARS-CoV-2 infection  and should not be used as the sole basis for treatment or other  patient management decisions.  A negative result may occur with  improper specimen collection / handling, submission of specimen other  than nasopharyngeal swab, presence of viral mutation(s) within the  areas targeted by this assay, and inadequate number of viral copies  (<250 copies / mL). A negative result must be combined with clinical  observations, patient history, and epidemiological information. If result is POSITIVE SARS-CoV-2 target nucleic acids are DETEC TED. The SARS-CoV-2 RNA is generally detectable in upper and lower  respiratory specimens during the acute phase of infection.  Positive  results are indicative of active infection with SARS-CoV-2.  Clinical  correlation with patient history and other diagnostic information is  necessary to determine patient infection status.  Positive results do  not rule out bacterial infection or co-infection with other viruses. If result is PRESUMPTIVE POSTIVE SARS-CoV-2 nucleic acids MAY BE PRESENT.   A presumptive positive result was obtained on the submitted specimen  and confirmed on repeat testing.  While 2019  novel coronavirus  (SARS-CoV-2) nucleic acids may be present in the submitted sample  additional confirmatory testing may be necessary for epidemiological  and / or clinical management purposes  to differentiate between  SARS-CoV-2 and other Sarbecovirus currently known to infect humans.  If clinically indicated additional testing with an alternate test  methodology (LAB7 453) is advised. The SARS-CoV-2 RNA is generally  detectable in upper and lower respiratory specimens during the acute  phase of infection. The expected result is Negative. Fact Sheet for Patients:  BoilerBrush.com.cyhttps://www.fda.gov/media/136312/download Fact Sheet for Healthcare Providers: https://pope.com/https://www.fda.gov/media/136313/download This  test is not yet approved or cleared by the [place] FDA and has been authorized for detection and/or diagnosis of SARS-CoV-2 by FDA under an Emergency Use Authorization (EUA).  This EUA will remain in effect (meaning this test can be used) for the duration of the COVID-19 declaration under Section 564(b)(1) of the Act, 21 U.S.C. section 360bbb-3(b)(1), unless the authorization is terminated or revoked sooner. Performed at [facility], [address] [place], [place] [address]   Blood Culture (routine x 2)     Status: None (Preliminary result)   Collection Time: 09/17/18  3:50 PM  Result Value Ref Range Status   Specimen Description   Final    BLOOD RIGHT ANTECUBITAL Performed at [facility], [address] [place], [place] [address]    Special Requests   Final    BOTTLES DRAWN AEROBIC AND ANAEROBIC Blood Culture adequate volume Performed at [facility], [address] [place], [place] [address]    Culture   Final    NO GROWTH 3 DAYS Performed at [facility], [address] [place], [place] [address]    Report Status PENDING  Incomplete  Blood Culture (routine x 2)     Status: None (Preliminary result)   Collection  Time: 09/17/18  4:33 PM  Result Value Ref Range Status   Specimen Description   Final    BLOOD LEFT ANTECUBITAL Performed at [facility], [address] [place], [place] [address]    Special Requests   Final    BOTTLES DRAWN AEROBIC AND ANAEROBIC Blood Culture adequate volume Performed at [facility], [address] [place], [place] [address]    Culture   Final    NO GROWTH 3 DAYS Performed at [facility], [address] [place], [place] [address]    Report Status PENDING  Incomplete      Radiology Studies: No results found.  Time spent: 40 minutes   Pamella Pertostin Koben Daman, MD, PhD [facility]  Contact via  [email or web address]  [facility] Office Info P: [phone]  F: [phone]

## 2018-09-21 NOTE — Progress Notes (Signed)
Pt continues to desat to low 82-84% on 10L  [place] & resp rate 28-32. At rest pt c/o runny nose and Sats return to 89-93%. Work of breathing appears more difficult with any activity. States she breaths better lying on rt side

## 2018-09-22 LAB — CBC WITH DIFFERENTIAL/PLATELET
Abs Immature Granulocytes: 0.16 10*3/uL — ABNORMAL HIGH (ref 0.00–0.07)
Basophils Absolute: 0 10*3/uL (ref 0.0–0.1)
Basophils Relative: 1 %
Eosinophils Absolute: 0.1 10*3/uL (ref 0.0–0.5)
Eosinophils Relative: 1 %
HCT: 43.2 % (ref 36.0–46.0)
Hemoglobin: 13.8 g/dL (ref 12.0–15.0)
Immature Granulocytes: 2 %
Lymphocytes Relative: 8 %
Lymphs Abs: 0.6 10*3/uL — ABNORMAL LOW (ref 0.7–4.0)
MCH: 32.3 pg (ref 26.0–34.0)
MCHC: 31.9 g/dL (ref 30.0–36.0)
MCV: 101.2 fL — ABNORMAL HIGH (ref 80.0–100.0)
Monocytes Absolute: 0.2 10*3/uL (ref 0.1–1.0)
Monocytes Relative: 2 %
Neutro Abs: 7.3 10*3/uL (ref 1.7–7.7)
Neutrophils Relative %: 86 %
Platelets: 314 10*3/uL (ref 150–400)
RBC: 4.27 MIL/uL (ref 3.87–5.11)
RDW: 13.4 % (ref 11.5–15.5)
WBC: 8.4 10*3/uL (ref 4.0–10.5)
nRBC: 0 % (ref 0.0–0.2)

## 2018-09-22 LAB — POCT I-STAT 7, (LYTES, BLD GAS, ICA,H+H)
Acid-Base Excess: 6 mmol/L — ABNORMAL HIGH (ref 0.0–2.0)
Bicarbonate: 30.4 mmol/L — ABNORMAL HIGH (ref 20.0–28.0)
Calcium, Ion: 1.16 mmol/L (ref 1.15–1.40)
HCT: 43 % (ref 36.0–46.0)
Hemoglobin: 14.6 g/dL (ref 12.0–15.0)
O2 Saturation: 88 %
Patient temperature: 97.8
Potassium: 3.8 mmol/L (ref 3.5–5.1)
Sodium: 137 mmol/L (ref 135–145)
TCO2: 32 mmol/L (ref 22–32)
pCO2 arterial: 42.5 mmHg (ref 32.0–48.0)
pH, Arterial: 7.46 — ABNORMAL HIGH (ref 7.350–7.450)
pO2, Arterial: 51 mmHg — ABNORMAL LOW (ref 83.0–108.0)

## 2018-09-22 LAB — COMPREHENSIVE METABOLIC PANEL
ALT: 19 U/L (ref 0–44)
AST: 45 U/L — ABNORMAL HIGH (ref 15–41)
Albumin: 3.6 g/dL (ref 3.5–5.0)
Alkaline Phosphatase: 136 U/L — ABNORMAL HIGH (ref 38–126)
Anion gap: 11 (ref 5–15)
BUN: 31 mg/dL — ABNORMAL HIGH (ref 8–23)
CO2: 28 mmol/L (ref 22–32)
Calcium: 9.3 mg/dL (ref 8.9–10.3)
Chloride: 99 mmol/L (ref 98–111)
Creatinine, Ser: 1.09 mg/dL — ABNORMAL HIGH (ref 0.44–1.00)
GFR calc Af Amer: >60 mL/min (ref >60)
GFR calc non Af Amer: 54 mL/min — ABNORMAL LOW (ref >60)
Glucose, Bld: 108 mg/dL — ABNORMAL HIGH (ref 70–99)
Potassium: 4.1 mmol/L (ref 3.5–5.1)
Sodium: 138 mmol/L (ref 135–145)
Total Bilirubin: 0.4 mg/dL (ref 0.3–1.2)
Total Protein: 6.4 g/dL — ABNORMAL LOW (ref 6.5–8.1)

## 2018-09-22 LAB — CULTURE, BLOOD (ROUTINE X 2)
Culture: NO GROWTH
Culture: NO GROWTH
Special Requests: ADEQUATE
Special Requests: ADEQUATE

## 2018-09-22 LAB — FERRITIN: Ferritin: 621 ng/mL — ABNORMAL HIGH (ref 11–307)

## 2018-09-22 LAB — D-DIMER, QUANTITATIVE: D-Dimer, Quant: 2 ug/mL-FEU — ABNORMAL HIGH (ref 0.00–0.50)

## 2018-09-22 MED ORDER — LORAZEPAM 2 MG/ML IJ SOLN
0.5000 mg | Freq: Four times a day (QID) | INTRAMUSCULAR | Status: DC | PRN
  Administered 2018-09-22 – 2018-09-24 (×3): 0.5 mg via INTRAVENOUS
  Filled 2018-09-22 (×3): qty 1

## 2018-09-22 MED ORDER — MORPHINE SULFATE (PF) 2 MG/ML IV SOLN
2.0000 mg | INTRAVENOUS | Status: DC | PRN
  Administered 2018-09-23 (×2): 2 mg via INTRAVENOUS
  Filled 2018-09-22 (×2): qty 1

## 2018-09-22 MED ORDER — METHYLPREDNISOLONE SODIUM SUCC 125 MG IJ SOLR
40.0000 mg | Freq: Three times a day (TID) | INTRAMUSCULAR | Status: DC
  Administered 2018-09-22 – 2018-09-24 (×6): 40 mg via INTRAVENOUS
  Filled 2018-09-22 (×6): qty 2

## 2018-09-22 NOTE — Progress Notes (Addendum)
PROGRESS NOTE  Becky Boyd PZW:[id number] DOB: [birth year] DOA: 09/17/2018 PCP: Sheliah Hatch, MD   LOS: 5 days   Brief Narrative / Interim history: 63 year old female with history of rheumatoid arthritis on chronic immunosuppressive therapy, Sjogren, Hashimoto thyroiditis, fibromyalgia who was admitted to the hospital on 09/17/2018 with fever, cough, nausea diarrhea and fatigue that started about 8 days prior to admission.  Her husband was hospitalized at [facility] and diagnosed with COVID-19.  In our ED she was positive for coronavirus, chest x-ray was concerning for left left infiltrate, she had a fever of 102 and developed hypoxic respiratory failure requiring oxygen supplementation.  Approximate date Covid symptoms started: 09/09/2018.  Today day 13  Patient has already finished a course of antibiotics with ceftriaxone and azithromycin on 4/17.   Subjective: Continues to feel weak.  While she denies shortness of breath is on 15 L nonrebreather this morning maintaining sats in the low 90s.  No chest pain.  No abdominal pain, nausea or vomiting.  Discussed with bedside RN, patient desats with very little activity.  Assessment & Plan: Principal Problem:   Fever Active Problems:   Hypothyroidism   Sjogren's syndrome (HCC)   COVID-19 virus infection   Acute on chronic respiratory failure with hypoxia (HCC)   Hypokalemia   Principal Problem Acute Hypoxic Resp. Failure due to Acute Covid 19 Viral Illness during the ongoing 2020 Covid 19 Pandemic  -Patient has an allergy to Plaquenil so she was not empirically started on it.  She has received ceftriaxone and azithromycin for presumed bacterial involvement given lobar pneumonia on initial chest x-ray. -Has received Actemra on 09/19/2018 and repeat dose 4/20 2020 -Continue proning as able however patient not able to do much, prefers to lay on the right side -Continues to have worsening respiratory status, from 2 L to 3 days ago  currently on a nonrebreather, desatting with little activity, will transfer to ICU in case she will need intubation later on.  COVID-19 markers -LDH [phone] -Ferritin [phone] -CRP 14.5-17.5-16.4-8.6 -QTc 449 on 4/19 -D-dimer 1.1-1.04-1.25-1.48-2.0 -Fibrinogen [phone] -IL-6 68.2  Oxygenation requirements -2 L on 4/18, 4 L on 4/19, 6 L on 4/20, 15 L nonrebreather 4/21  Fluid status -Currently receiving Lasix  Rheumatoid arthritis/Sjogren's syndrome -Continue low-dose prednisone and sulfadiazine  Hypothyroidism -Continue Synthroid  Hypokalemia -Replete orally, resolved,  Elevated LFTs -Continue to monitor   Scheduled Meds: . atorvastatin  20 mg Oral Daily  . baclofen  10 mg Oral TID  . chlorpheniramine-HYDROcodone  5 mL Oral Q12H  . DULoxetine  60 mg Oral Daily  . enoxaparin (LOVENOX) injection  40 mg Subcutaneous Q24H  . feeding supplement (ENSURE ENLIVE)  237 mL Oral BID BM  . furosemide  40 mg Intravenous Daily  . levothyroxine  75 mcg Oral QAC breakfast  . pantoprazole  40 mg Oral Daily  . predniSONE  5 mg Oral Daily  . sulfaSALAzine  1,000 mg Oral BID  . vitamin C  500 mg Oral Daily  . zinc sulfate  220 mg Oral Daily   Continuous Infusions: . sodium chloride Stopped (09/19/18 1540)  . ondansetron [facility]) IV 8 mg (09/20/18 0641)   PRN Meds:.sodium chloride, acetaminophen, diphenoxylate-atropine, guaiFENesin-dextromethorphan, ondansetron **OR** ondansetron (ZOFRAN) IV, promethazine  DVT prophylaxis: Lovenox Code Status: Full code Family Communication: Discussed with daughter Roxane Hommel at [phone] over the phone Disposition Plan: To be determined  Consultants:   None  Procedures:   None   Antimicrobials:  Finished ceftriaxone azithromycin on 4/17  Objective:  Vitals:   09/21/18 1324 09/21/18 1707 09/21/18 2109 09/22/18 0326  BP: 114/70  118/74 120/90  Pulse:   (!) 102 98  Resp:   (!) 32 (!) 27  Temp:  98.6 F (37 C) 98.2 F (36.8 C) 98.8 F (37.1 C) 97.7 F (36.5 C)  TempSrc:  Oral Oral Axillary  SpO2:   (!) 88% 92%  Weight:      Height:        Intake/Output Summary (Last 24 hours) at 09/22/2018 0827 Last data filed at 09/21/2018 2100 Gross per 24 hour  Intake 580 ml  Output 100 ml  Net 480 ml   Filed Weights   09/17/18 1521  Weight: 64.4 kg    Examination: Constitutional: No apparent distress, but appears quite fatigued Eyes: No scleral icterus ENMT: Moist mucous membranes Neck: normal, supple Respiratory: Decreased breath sounds at the bases, no wheezing, no crackles Cardiovascular: Regular rate and rhythm, no murmurs appreciated.  No peripheral edema Abdomen: Mild tenderness to palpation lower quadrants, no guarding or rebound.  Bowel sounds positive. Musculoskeletal: no clubbing / cyanosis. Skin: No rashes seen Neurologic: No deficits, equal strength  Data Reviewed: I have independently reviewed following labs and imaging studies   CBC: Recent Labs  Lab 09/19/18 0615 09/19/18 1415 09/20/18 0620 09/21/18 0510 09/21/18 1224 09/22/18 0450  WBC 6.9 7.3 4.0 6.0  --  8.4  NEUTROABS 5.7 6.5 3.2 4.9  --  7.3  HGB 11.8* 12.1 11.7* 13.1 15.0 13.8  HCT 36.8 37.7 38.4 42.7 44.0 43.2  MCV 100.5* 100.0 102.4* 102.2*  --  101.2*  PLT 193 213 228 325  --  314   Basic Metabolic Panel: Recent Labs  Lab 09/17/18 1753  09/19/18 0615 09/19/18 1415 09/20/18 0620 09/21/18 0510 09/21/18 1224 09/22/18 0450  NA  --    < > 139 138 140 141 138 138  K  --    < > 3.7 3.4* 4.8 4.7 4.5 4.1  CL  --    < > 101 100 102 102  --  99  CO2  --    < > 27 27 29 27   --  28  GLUCOSE  --    < > 91 126* 125* 102*  --  108*  BUN  --    < > 13 13 18 19   --  31*  CREATININE  --    < > 0.91 0.95 0.97 0.99  --  1.09*  CALCIUM  --    < > 8.5* 8.4* 8.9 9.3  --  9.3  MG 1.8  --   --   --   --   --   --   --    < > = values in this interval not displayed.   GFR: Estimated Creatinine  Clearance: 47.1 mL/min (A) (by C-G formula based on SCr of 1.09 mg/dL (H)). Liver Function Tests: Recent Labs  Lab 09/19/18 0615 09/19/18 1415 09/20/18 0620 09/21/18 0510 09/22/18 0450  AST 55* 58* 51* 46* 45*  ALT 25 26 24 21 19   ALKPHOS 142* 155* 142* 142* 136*  BILITOT 0.6 0.4 0.6 0.4 0.4  PROT 6.8 6.8 7.2 6.7 6.4*  ALBUMIN 3.3* 3.4* 3.6 3.6 3.6   No results for input(s): LIPASE, AMYLASE in the last 168 hours. No results for input(s): AMMONIA in the last 168 hours. Coagulation Profile: Recent Labs  Lab 09/19/18 1415  INR 0.9   Cardiac Enzymes: Recent Labs  Lab 09/19/18 1415 09/20/18 0620 09/21/18  0510  CKTOTAL 111 75 41   BNP (last 3 results) No results for input(s): PROBNP in the last 8760 hours. HbA1C: No results for input(s): HGBA1C in the last 72 hours. CBG: No results for input(s): GLUCAP in the last 168 hours. Lipid Profile: No results for input(s): CHOL, HDL, LDLCALC, TRIG, CHOLHDL, LDLDIRECT in the last 72 hours. Thyroid Function Tests: No results for input(s): TSH, T4TOTAL, FREET4, T3FREE, THYROIDAB in the last 72 hours. Anemia Panel: Recent Labs    09/21/18 0510 09/22/18 0450  FERRITIN 636* 621*   Urine analysis:    Component Value Date/Time   COLORURINE YELLOW 09/05/2009 2343   APPEARANCEUR CLOUDY (A) 09/05/2009 2343   LABSPEC 1.027 09/05/2009 2343   PHURINE 6.0 09/05/2009 2343   GLUCOSEU 100 (A) 09/05/2009 2343   HGBUR MODERATE (A) 09/05/2009 2343   BILIRUBINUR Negative 01/27/2017 1200   KETONESUR NEGATIVE 09/05/2009 2343   PROTEINUR + 01/27/2017 1200   PROTEINUR NEGATIVE 09/05/2009 2343   UROBILINOGEN 0.2 01/27/2017 1200   UROBILINOGEN 0.2 09/05/2009 2343   NITRITE + 01/27/2017 1200   NITRITE NEGATIVE 09/05/2009 2343   LEUKOCYTESUR Small (1+) (A) 01/27/2017 1200   Sepsis Labs: Invalid input(s): PROCALCITONIN, LACTICIDVEN  Recent Results (from the past 240 hour(s))  SARS Coronavirus 2 [facility](Hospital order, [facility])     Status: Abnormal   Collection Time: 09/17/18  3:50 PM  Result Value Ref Range Status   SARS Coronavirus 2 POSITIVE (A) NEGATIVE Final    Comment: RESULT CALLED TO, READ BACK BY AND VERIFIED WITH: S.BINGHAM AT 1717 ON 09/17/18 BY N.THOMPSON (NOTE) If result is NEGATIVE SARS-CoV-2 target nucleic acids are NOT DETECTED. The SARS-CoV-2 RNA is generally detectable in upper and lower  respiratory specimens during the acute phase of infection. The lowest  concentration of SARS-CoV-2 viral copies this assay can detect is 250  copies / mL. A negative result does not preclude SARS-CoV-2 infection  and should not be used as the sole basis for treatment or other  patient management decisions.  A negative result may occur with  improper specimen collection / handling, submission of specimen other  than nasopharyngeal swab, presence of viral mutation(s) within the  areas targeted by this assay, and inadequate number of viral copies  (<250 copies / mL). A negative result must be combined with clinical  observations, patient history, and epidemiological information. If result is POSITIVE SARS-CoV-2 target nucleic acids are DETEC TED. The SARS-CoV-2 RNA is generally detectable in upper and lower  respiratory specimens during the acute phase of infection.  Positive  results are indicative of active infection with SARS-CoV-2.  Clinical  correlation with patient history and other diagnostic information is  necessary to determine patient infection status.  Positive results do  not rule out bacterial infection or co-infection with other viruses. If result is PRESUMPTIVE POSTIVE SARS-CoV-2 nucleic acids MAY BE PRESENT.   A presumptive positive result was obtained on the submitted specimen  and confirmed on repeat testing.  While 2019 novel coronavirus  (SARS-CoV-2) nucleic acids may be present in the submitted sample  additional confirmatory testing may be necessary for epidemiological   and / or clinical management purposes  to differentiate between  SARS-CoV-2 and other Sarbecovirus currently known to infect humans.  If clinically indicated additional testing with an alternate test  methodology (LAB7 453) is advised. The SARS-CoV-2 RNA is generally  detectable in upper and lower respiratory specimens during the acute  phase of infection. The expected result  is Negative. Fact Sheet for Patients:  BoilerBrush.com.cy Fact Sheet for Healthcare Providers: https://pope.com/ This test is not yet approved or cleared by the [place] FDA and has been authorized for detection and/or diagnosis of SARS-CoV-2 by FDA under an Emergency Use Authorization (EUA).  This EUA will remain in effect (meaning this test can be used) for the duration of the COVID-19 declaration under Section 564(b)(1) of the Act, 21 U.S.C. section 360bbb-3(b)(1), unless the authorization is terminated or revoked sooner. Performed at [facility], [address], [place], [place] [address]   Blood Culture (routine x 2)     Status: None (Preliminary result)   Collection Time: 09/17/18  3:50 PM  Result Value Ref Range Status   Specimen Description   Final    BLOOD RIGHT ANTECUBITAL Performed at [facility], [address], [place], [place] [address]    Special Requests   Final    BOTTLES DRAWN AEROBIC AND ANAEROBIC Blood Culture adequate volume Performed at [facility], [address], [place], [place] [address]    Culture   Final    NO GROWTH 4 DAYS Performed at [facility], [address], [place], [place] [address]    Report Status PENDING  Incomplete  Blood Culture (routine x 2)     Status: None (Preliminary result)   Collection Time: 09/17/18  4:33 PM  Result Value Ref Range Status   Specimen Description   Final    BLOOD LEFT ANTECUBITAL Performed at [facility], [address], [place], [place] [address]    Special Requests   Final    BOTTLES DRAWN AEROBIC AND ANAEROBIC Blood Culture adequate volume Performed at [facility], [address], [place], [place] [address]    Culture   Final    NO GROWTH 4 DAYS Performed at [facility], [address] [place], [place] [address]    Report Status PENDING  Incomplete      Radiology Studies: Dg Chest Port 1 View  Result Date: 09/21/2018 CLINICAL DATA:  63 year old female with a history of dyspnea EXAM: PORTABLE CHEST 1 VIEW COMPARISON:  None. FINDINGS: Cardiac diameter borderline enlarged, with left rotation. Reticular pattern of opacities. No large pleural effusion or pneumothorax. Pleuroparenchymal thickening at the apices. Calcified breast implants partially obscure the bases of the lungs. Surgical changes of cervical region. IMPRESSION: Reticular pattern of opacity of the bilateral lung bases, could represent chronic fibrosis/scarring versus atypical infection or bronchitis. Electronically Signed   By: Gilmer Mor D.O.   On: 09/21/2018 10:48    Time spent: 40 minutes   Pamella Pert, MD, PhD [facility]  Contact via  [email or web address]  [facility] Office Info P: [phone]  F: [phone]

## 2018-09-22 NOTE — Progress Notes (Signed)
Patient transferred to ICU for inability to maintain oxygen saturation at rest on 15 lpm NRB mask. Report called. Family updated. Patient transferred w/ all belongings. Tolerated transfer well.

## 2018-09-22 NOTE — Progress Notes (Signed)
Patient pulling off O2 consistently and pulling at lines. Placed mittens on patient. Paged Virginia Rochester, MD. Will continue to monitor.

## 2018-09-22 NOTE — Progress Notes (Signed)
Pt O2 saturation began to fall in the high 80's again. Pt did not show any obvious signs of distress and her mentation had not changed from baseline assessment. This RN educated patient about the ability to increase oxygenation in prone position.  Patient tolerated prone position for 1.5 hours. Pt SPO2 while proned was 95-98. Will continue to monitor.

## 2018-09-22 NOTE — Progress Notes (Signed)
This RN spoke with pt daughter, [name], and updated her on pt current condition. [name] verbalized understanding of her mother's condition and expressed gratitude for the care being provided to her mother. Will continue to monitor.

## 2018-09-22 NOTE — Progress Notes (Signed)
Pt had another episode of desaturation to 79%  On 10LHFNC when changing position in bed and blowing nose. Very slow return to 88% so pt was changed to 100%NRB with gradual increase to 92% , Encouraging deep breathing,continue to monitor

## 2018-09-23 ENCOUNTER — Inpatient Hospital Stay (HOSPITAL_COMMUNITY): Payer: Self-pay

## 2018-09-23 DIAGNOSIS — M05711 Rheumatoid arthritis with rheumatoid factor of right shoulder without organ or systems involvement: Secondary | ICD-10-CM

## 2018-09-23 DIAGNOSIS — M069 Rheumatoid arthritis, unspecified: Secondary | ICD-10-CM | POA: Diagnosis present

## 2018-09-23 DIAGNOSIS — R0600 Dyspnea, unspecified: Secondary | ICD-10-CM

## 2018-09-23 DIAGNOSIS — I5032 Chronic diastolic (congestive) heart failure: Secondary | ICD-10-CM | POA: Diagnosis present

## 2018-09-23 LAB — POCT I-STAT 7, (LYTES, BLD GAS, ICA,H+H)
Acid-Base Excess: 4 mmol/L — ABNORMAL HIGH (ref 0.0–2.0)
Bicarbonate: 29.8 mmol/L — ABNORMAL HIGH (ref 20.0–28.0)
Calcium, Ion: 1.13 mmol/L — ABNORMAL LOW (ref 1.15–1.40)
HCT: 44 % (ref 36.0–46.0)
Hemoglobin: 15 g/dL (ref 12.0–15.0)
O2 Saturation: 98 %
Patient temperature: 98.1
Potassium: 3.7 mmol/L (ref 3.5–5.1)
Sodium: 139 mmol/L (ref 135–145)
TCO2: 31 mmol/L (ref 22–32)
pCO2 arterial: 47.6 mmHg (ref 32.0–48.0)
pH, Arterial: 7.404 (ref 7.350–7.450)
pO2, Arterial: 114 mmHg — ABNORMAL HIGH (ref 83.0–108.0)

## 2018-09-23 LAB — COMPREHENSIVE METABOLIC PANEL
ALT: 17 U/L (ref 0–44)
AST: 39 U/L (ref 15–41)
Albumin: 4.1 g/dL (ref 3.5–5.0)
Alkaline Phosphatase: 142 U/L — ABNORMAL HIGH (ref 38–126)
Anion gap: 16 — ABNORMAL HIGH (ref 5–15)
BUN: 49 mg/dL — ABNORMAL HIGH (ref 8–23)
CO2: 26 mmol/L (ref 22–32)
Calcium: 9.3 mg/dL (ref 8.9–10.3)
Chloride: 99 mmol/L (ref 98–111)
Creatinine, Ser: 1.26 mg/dL — ABNORMAL HIGH (ref 0.44–1.00)
GFR calc Af Amer: 52 mL/min — ABNORMAL LOW (ref >60)
GFR calc non Af Amer: 45 mL/min — ABNORMAL LOW (ref >60)
Glucose, Bld: 134 mg/dL — ABNORMAL HIGH (ref 70–99)
Potassium: 4 mmol/L (ref 3.5–5.1)
Sodium: 141 mmol/L (ref 135–145)
Total Bilirubin: 1.1 mg/dL (ref 0.3–1.2)
Total Protein: 4.5 g/dL — ABNORMAL LOW (ref 6.5–8.1)

## 2018-09-23 LAB — CBC WITH DIFFERENTIAL/PLATELET
Abs Immature Granulocytes: 0.17 10*3/uL — ABNORMAL HIGH (ref 0.00–0.07)
Basophils Absolute: 0 10*3/uL (ref 0.0–0.1)
Basophils Relative: 0 %
Eosinophils Absolute: 0 10*3/uL (ref 0.0–0.5)
Eosinophils Relative: 0 %
HCT: 45.9 % (ref 36.0–46.0)
Hemoglobin: 14.2 g/dL (ref 12.0–15.0)
Immature Granulocytes: 2 %
Lymphocytes Relative: 5 %
Lymphs Abs: 0.5 10*3/uL — ABNORMAL LOW (ref 0.7–4.0)
MCH: 31.1 pg (ref 26.0–34.0)
MCHC: 30.9 g/dL (ref 30.0–36.0)
MCV: 100.7 fL — ABNORMAL HIGH (ref 80.0–100.0)
Monocytes Absolute: 0.4 10*3/uL (ref 0.1–1.0)
Monocytes Relative: 4 %
Neutro Abs: 10.3 10*3/uL — ABNORMAL HIGH (ref 1.7–7.7)
Neutrophils Relative %: 89 %
Platelets: 377 10*3/uL (ref 150–400)
RBC: 4.56 MIL/uL (ref 3.87–5.11)
RDW: 13.2 % (ref 11.5–15.5)
WBC: 11.5 10*3/uL — ABNORMAL HIGH (ref 4.0–10.5)
nRBC: 0 % (ref 0.0–0.2)

## 2018-09-23 LAB — C-REACTIVE PROTEIN: CRP: 2.2 mg/dL — ABNORMAL HIGH (ref <1.0)

## 2018-09-23 LAB — D-DIMER, QUANTITATIVE: D-Dimer, Quant: 2.51 ug/mL-FEU — ABNORMAL HIGH (ref 0.00–0.50)

## 2018-09-23 LAB — FERRITIN: Ferritin: 551 ng/mL — ABNORMAL HIGH (ref 11–307)

## 2018-09-23 NOTE — Progress Notes (Addendum)
PROGRESS NOTE    Becky Boyd  QDI:[id number] DOB: [birth year] DOA: 09/17/2018 PCP: Sheliah Hatch, MD   Brief Narrative:  63 year old female PMHx rheumatoid arthritis on chronic immunosuppressive therapy (prednisone+ SulfaSalazine), Sjogren, Hashimoto thyroiditis, fibromyalgia   Admitted to the hospital on 09/17/2018 with fever, cough, nausea diarrhea and fatigue that started about 8 days prior to admission.  Her husband was hospitalized at [facility] and diagnosed with COVID-19.  In our ED she was positive for coronavirus, chest x-ray was concerning for left left infiltrate, she had a fever of 102 and developed hypoxic respiratory failure requiring oxygen supplementation.   Approximate date Covid symptoms started: 09/09/2018.  Today day 13   Patient has already finished a course of antibiotics with ceftriaxone and azithromycin on 4/17.     Subjective: 4/22 A/O x4 states her husband initially got sick was hospitalized at [facility], just after he was hospitalized she began to have symptoms of S OB, nonproductive cough, fever.  States he is recovered at present at home    Assessment & Plan:   Principal Problem:   Fever Active Problems:   Hypothyroidism   Sjogren's syndrome (HCC)   COVID-19 virus infection   Acute on chronic respiratory failure with hypoxia (HCC)   Hypokalemia  Acute respiratory failure with hypoxia/COVID-19 pneumonia - Patient has Plaquenil allergy (prolonged QT interval), therefore did not receive empirically - Received ceftriaxone + azithromycin for presumed bacterial involvement -Received Actemra on 4/18 and repeat dose on 4/20 -We will place patient in prone position for 16 hours a day.  Minimum of 2 to 3 hours at a time -Solu-Medrol 40 mg 3 times daily -We will try to avoid intubation    COVID-19 markers Recent Labs  Lab 09/17/18 1550 09/19/18 0615 09/20/18 0620 09/21/18 0510  LDH 392* 480* 496* 556*   Recent Labs  Lab 09/19/18 1415  09/20/18 0620 09/21/18 0510 09/22/18 0450 09/23/18 0500  FERRITIN 563* 608* 636* 621* 551*   Recent Labs  Lab 09/17/18 1550 09/19/18 0615 09/20/18 0620 09/21/18 0510  CRP 14.5* 17.5* 16.4* 8.6*   Recent Labs  Lab 09/19/18 0615 09/20/18 0620 09/21/18 0510 09/22/18 0450 09/23/18 0500  DDIMER 1.04* 1.25* 1.48* 2.00* 2.51*   Recent Labs  Lab 09/17/18 1550 09/19/18 0615 09/20/18 0620 09/21/18 0510  FIBRINOGEN 790* 772* 777* 785*   -QTc 449 on 4/19 -IL-6; 68.2   Oxygenation requirements -2 L on 4/18, 4 L on 4/19, 6 L on 4/20, 15 L nonrebreather 4/21.  4/22 continued 15 L on nonrebreather see below   Chronic diastolic CHF -Last echocardiogram 08/30/2014 grade 1 diastolic dysfunction normal EF -Strict in and out -Daily weight -Lasix 40 mg daily - Obtain updated echocardiogram  Rheumatoid arthritis/Sjogren's Syndrome  -SulfaSalazine thousand milligrams twice daily - Prednisone discontinued when her high-dose Solu-Medrol was started.  Hypothyroidism  - Synthroid 75 mcg    Hypokalemia -Resolved   Elevated LFTs -Continue to monitor    DVT prophylaxis:  Code Status: Full Family Communication: Left message on phone that I was trying to update the husband Disposition Plan: TBD   Consultants:  None  Procedures/Significant Events:     I have personally reviewed and interpreted all radiology studies and my findings are as above.  VENTILATOR SETTINGS: 4/22 HF [place]: Nonrebreather FiO2: 15 L/min SPO2 96%   Cultures 4/16 blood right antecubital negative 4/16 blood LEFT antecubital negative   Antimicrobials: Anti-infectives (From admission, onward)   Start     Stop   09/20/18 1000  hydroxychloroquine (  PLAQUENIL) tablet 200 mg  Status:  Discontinued     09/19/18 1305   09/19/18 1315  hydroxychloroquine (PLAQUENIL) tablet 400 mg  Status:  Discontinued     09/19/18 1305   09/18/18 1000  azithromycin (ZITHROMAX) 500 mg in sodium chloride 0.9 % 250 mL  IVPB  Status:  Discontinued     09/19/18 1011   09/18/18 0930  cefTRIAXone (ROCEPHIN) 1 g in sodium chloride 0.9 % 100 mL IVPB  Status:  Discontinued     09/19/18 1011   09/17/18 1700  cefTRIAXone (ROCEPHIN) 1 g in sodium chloride 0.9 % 100 mL IVPB  Status:  Discontinued     09/17/18 1744   09/17/18 1700  azithromycin (ZITHROMAX) 500 mg in sodium chloride 0.9 % 250 mL IVPB  Status:  Discontinued     09/17/18 1744       Devices    LINES / TUBES:     Continuous Infusions:  sodium chloride 1,000 mL (09/22/18 1454)   ondansetron (ZOFRAN) IV 8 mg (09/20/18 0641)     Objective: Vitals:   09/23/18 0500 09/23/18 0600 09/23/18 0615 09/23/18 0705  BP: 119/81 131/84 (!) 128/92   Pulse: 95 99 100 96  Resp: (!) 23 (!) 25 (!) 24 (!) 24  Temp:      TempSrc:      SpO2: (!) 88% (!) 89% 92% 96%  Weight:      Height:        Intake/Output Summary (Last 24 hours) at 09/23/2018 1610 Last data filed at 09/23/2018 0200 Gross per 24 hour  Intake 120 ml  Output 1000 ml  Net -880 ml   Filed Weights   09/17/18 1521  Weight: 64.4 kg    Examination:  General: A/O x4, positive acute respiratory distress, cachectic Eyes: negative scleral hemorrhage, negative anisocoria, negative icterus ENT: Negative Runny nose, negative gingival bleeding, Neck:  Negative scars, masses, torticollis, lymphadenopathy, JVD Lungs: Tachypneic, diffuse poor air movement, without wheezes or crackles Cardiovascular: Tachycardic, without murmur gallop or rub normal S1 and S2 Abdomen: negative abdominal pain, nondistended, positive soft, bowel sounds, no rebound, no ascites, no appreciable mass Extremities: No significant cyanosis, clubbing, or edema bilateral lower extremities Skin: Negative rashes, lesions, ulcers Psychiatric:  Negative depression, negative anxiety, negative fatigue, negative mania  Central nervous system:  Cranial nerves II through XII intact, tongue/uvula midline, all extremities muscle  strength 5/5, sensation intact throughout, negative dysarthria, negative expressive aphasia, negative receptive aphasia.  .     Data Reviewed: Care during the described time interval was provided by me .  I have reviewed this patient's available data, including medical history, events of note, physical examination, and all test results as part of my evaluation.   CBC: Recent Labs  Lab 09/19/18 1415 09/20/18 0620 09/21/18 0510 09/21/18 1224 09/22/18 0450 09/22/18 1141 09/23/18 0500  WBC 7.3 4.0 6.0  --  8.4  --  11.5*  NEUTROABS 6.5 3.2 4.9  --  7.3  --  10.3*  HGB 12.1 11.7* 13.1 15.0 13.8 14.6 14.2  HCT 37.7 38.4 42.7 44.0 43.2 43.0 45.9  MCV 100.0 102.4* 102.2*  --  101.2*  --  100.7*  PLT 213 228 325  --  314  --  377   Basic Metabolic Panel: Recent Labs  Lab 09/17/18 1753  09/19/18 1415 09/20/18 0620 09/21/18 0510 09/21/18 1224 09/22/18 0450 09/22/18 1141 09/23/18 0500  NA  --    < > 138 140 141 138 138 137 141  K  --    < > 3.4* 4.8 4.7 4.5 4.1 3.8 4.0  CL  --    < > 100 102 102  --  99  --  99  CO2  --    < > 27 29 27   --  28  --  26  GLUCOSE  --    < > 126* 125* 102*  --  108*  --  134*  BUN  --    < > 13 18 19   --  31*  --  49*  CREATININE  --    < > 0.95 0.97 0.99  --  1.09*  --  1.26*  CALCIUM  --    < > 8.4* 8.9 9.3  --  9.3  --  9.3  MG 1.8  --   --   --   --   --   --   --   --    < > = values in this interval not displayed.   GFR: Estimated Creatinine Clearance: 40.7 mL/min (A) (by C-G formula based on SCr of 1.26 mg/dL (H)). Liver Function Tests: Recent Labs  Lab 09/19/18 1415 09/20/18 0620 09/21/18 0510 09/22/18 0450 09/23/18 0500  AST 58* 51* 46* 45* 39  ALT 26 24 21 19 17   ALKPHOS 155* 142* 142* 136* 142*  BILITOT 0.4 0.6 0.4 0.4 1.1  PROT 6.8 7.2 6.7 6.4* 4.5*  ALBUMIN 3.4* 3.6 3.6 3.6 4.1   No results for input(s): LIPASE, AMYLASE in the last 168 hours. No results for input(s): AMMONIA in the last 168 hours. Coagulation  Profile: Recent Labs  Lab 09/19/18 1415  INR 0.9   Cardiac Enzymes: Recent Labs  Lab 09/19/18 1415 09/20/18 0620 09/21/18 0510  CKTOTAL 111 75 41   BNP (last 3 results) No results for input(s): PROBNP in the last 8760 hours. HbA1C: No results for input(s): HGBA1C in the last 72 hours. CBG: No results for input(s): GLUCAP in the last 168 hours. Lipid Profile: No results for input(s): CHOL, HDL, LDLCALC, TRIG, CHOLHDL, LDLDIRECT in the last 72 hours. Thyroid Function Tests: No results for input(s): TSH, T4TOTAL, FREET4, T3FREE, THYROIDAB in the last 72 hours. Anemia Panel: Recent Labs    09/21/18 0510 09/22/18 0450  FERRITIN 636* 621*   Urine analysis:    Component Value Date/Time   COLORURINE YELLOW 09/05/2009 2343   APPEARANCEUR CLOUDY (A) 09/05/2009 2343   LABSPEC 1.027 09/05/2009 2343   PHURINE 6.0 09/05/2009 2343   GLUCOSEU 100 (A) 09/05/2009 2343   HGBUR MODERATE (A) 09/05/2009 2343   BILIRUBINUR Negative 01/27/2017 1200   KETONESUR NEGATIVE 09/05/2009 2343   PROTEINUR + 01/27/2017 1200   PROTEINUR NEGATIVE 09/05/2009 2343   UROBILINOGEN 0.2 01/27/2017 1200   UROBILINOGEN 0.2 09/05/2009 2343   NITRITE + 01/27/2017 1200   NITRITE NEGATIVE 09/05/2009 2343   LEUKOCYTESUR Small (1+) (A) 01/27/2017 1200   Sepsis Labs: @LABRCNTIP (procalcitonin:4,lacticidven:4)  ) Recent Results (from the past 240 hour(s))  SARS Coronavirus 2 [facility] order, Performed in [facility] hospital lab)     Status: Abnormal   Collection Time: 09/17/18  3:50 PM  Result Value Ref Range Status   SARS Coronavirus 2 POSITIVE (A) NEGATIVE Final    Comment: RESULT CALLED TO, READ BACK BY AND VERIFIED WITH: S.BINGHAM AT 1717 ON 09/17/18 BY N.THOMPSON (NOTE) If result is NEGATIVE SARS-CoV-2 target nucleic acids are NOT DETECTED. The SARS-CoV-2 RNA is generally detectable in upper and lower  respiratory specimens during the acute  phase of infection. The lowest  concentration of  SARS-CoV-2 viral copies this assay can detect is 250  copies / mL. A negative result does not preclude SARS-CoV-2 infection  and should not be used as the sole basis for treatment or other  patient management decisions.  A negative result may occur with  improper specimen collection / handling, submission of specimen other  than nasopharyngeal swab, presence of viral mutation(s) within the  areas targeted by this assay, and inadequate number of viral copies  (<250 copies / mL). A negative result must be combined with clinical  observations, patient history, and epidemiological information. If result is POSITIVE SARS-CoV-2 target nucleic acids are DETEC TED. The SARS-CoV-2 RNA is generally detectable in upper and lower  respiratory specimens during the acute phase of infection.  Positive  results are indicative of active infection with SARS-CoV-2.  Clinical  correlation with patient history and other diagnostic information is  necessary to determine patient infection status.  Positive results do  not rule out bacterial infection or co-infection with other viruses. If result is PRESUMPTIVE POSTIVE SARS-CoV-2 nucleic acids MAY BE PRESENT.   A presumptive positive result was obtained on the submitted specimen  and confirmed on repeat testing.  While 2019 novel coronavirus  (SARS-CoV-2) nucleic acids may be present in the submitted sample  additional confirmatory testing may be necessary for epidemiological  and / or clinical management purposes  to differentiate between  SARS-CoV-2 and other Sarbecovirus currently known to infect humans.  If clinically indicated additional testing with an alternate test  methodology (LAB7 453) is advised. The SARS-CoV-2 RNA is generally  detectable in upper and lower respiratory specimens during the acute  phase of infection. The expected result is Negative. Fact Sheet for Patients:  BoilerBrush.com.cyhttps://www.fda.gov/media/136312/download Fact Sheet for Healthcare  Providers: https://pope.com/https://www.fda.gov/media/136313/download This test is not yet approved or cleared by the [place] FDA and has been authorized for detection and/or diagnosis of SARS-CoV-2 by FDA under an Emergency Use Authorization (EUA).  This EUA will remain in effect (meaning this test can be used) for the duration of the COVID-19 declaration under Section 564(b)(1) of the Act, 21 U.S.C. section 360bbb-3(b)(1), unless the authorization is terminated or revoked sooner. Performed at [facility], [address] [place], [place] [address]   Blood Culture (routine x 2)     Status: None   Collection Time: 09/17/18  3:50 PM  Result Value Ref Range Status   Specimen Description   Final    BLOOD RIGHT ANTECUBITAL Performed at [facility], [address] [place], [place] [address]    Special Requests   Final    BOTTLES DRAWN AEROBIC AND ANAEROBIC Blood Culture adequate volume Performed at [facility], [address] [place], [place] [address]    Culture   Final    NO GROWTH 5 DAYS Performed at [facility], [address] [place], [place] [address]    Report Status 09/22/2018 FINAL  Final  Blood Culture (routine x 2)     Status: None   Collection Time: 09/17/18  4:33 PM  Result Value Ref Range Status   Specimen Description   Final    BLOOD LEFT ANTECUBITAL Performed at [facility], [address] [place], [place] [address]    Special Requests   Final    BOTTLES DRAWN AEROBIC AND ANAEROBIC Blood Culture adequate volume Performed at [facility], [address] [place], [place] [address]  Culture   Final    NO GROWTH 5 DAYS Performed at [facility], [address] [place], [place] [address]    Report Status 09/22/2018 FINAL  Final         Radiology Studies: Dg Chest Port 1 View  Result Date: 09/21/2018 CLINICAL DATA:  63 year old female with a history of  dyspnea EXAM: PORTABLE CHEST 1 VIEW COMPARISON:  None. FINDINGS: Cardiac diameter borderline enlarged, with left rotation. Reticular pattern of opacities. No large pleural effusion or pneumothorax. Pleuroparenchymal thickening at the apices. Calcified breast implants partially obscure the bases of the lungs. Surgical changes of cervical region. IMPRESSION: Reticular pattern of opacity of the bilateral lung bases, could represent chronic fibrosis/scarring versus atypical infection or bronchitis. Electronically Signed   By: Gilmer MorJaime  Wagner D.O.   On: 09/21/2018 10:48        Scheduled Meds:  atorvastatin  20 mg Oral Daily   baclofen  10 mg Oral TID   chlorpheniramine-HYDROcodone  5 mL Oral Q12H   DULoxetine  60 mg Oral Daily   enoxaparin (LOVENOX) injection  40 mg Subcutaneous Q24H   feeding supplement (ENSURE ENLIVE)  237 mL Oral BID BM   furosemide  40 mg Intravenous Daily   levothyroxine  75 mcg Oral QAC breakfast   methylPREDNISolone (SOLU-MEDROL) injection  40 mg Intravenous Q8H   pantoprazole  40 mg Oral Daily   sulfaSALAzine  1,000 mg Oral BID   vitamin C  500 mg Oral Daily   zinc sulfate  220 mg Oral Daily   Continuous Infusions:  sodium chloride 1,000 mL (09/22/18 1454)   ondansetron (ZOFRAN) IV 8 mg (09/20/18 0641)     LOS: 6 days   The patient is critically ill with multiple organ systems failure and requires high complexity decision making for assessment and support, frequent evaluation and titration of therapies, application of advanced monitoring technologies and extensive interpretation of multiple databases. Critical Care Time devoted to patient care services described in this note  Time spent: 40 minutes     Lesslie Mckeehan, Roselind MessierURTIS J, MD [facility] Pager [phone]  If 7PM-7AM, please contact night-coverage [email or web address] Password [email or web address] 09/23/2018, 7:18 AM

## 2018-09-23 NOTE — Progress Notes (Signed)
Patient having minimal urinary output this shift, bladder scan performed, showed 539 mL. Dr. Rito Ehrlich made aware. Once in and out cath to be completed.

## 2018-09-23 NOTE — Progress Notes (Signed)
Updated patient's daughter, [name].

## 2018-09-23 NOTE — Progress Notes (Signed)
Patient admitted with COVID and PNA.  CPK normal and no CHF on Cxray.  SHe has a hx of chronic diastolic CHF but do not see a need at this time to update echo unless clinically she appears to be in CHF or enzymes become positive.  Will cancel for now.

## 2018-09-23 NOTE — Progress Notes (Signed)
Patient not tolerating full proning, but is tolerating being turned far on right side. With this, she is showing improvement in SpO2. Before turning, 89%, after turning patient is at 96% with [place] and nonrebreather. Will attempt to wean from nonrebreather as patient tolerates.

## 2018-09-23 NOTE — Progress Notes (Signed)
Updated pt's daughter [name] via the phone. Pt and her daughter were able to talk to each other. Will attempt facetime this afternoon with pt's husband and daughter. Will continue to monitor pt.

## 2018-09-24 DIAGNOSIS — J9601 Acute respiratory failure with hypoxia: Secondary | ICD-10-CM

## 2018-09-24 DIAGNOSIS — G9340 Encephalopathy, unspecified: Secondary | ICD-10-CM

## 2018-09-24 LAB — CREATININE, SERUM
Creatinine, Ser: 1.3 mg/dL — ABNORMAL HIGH (ref 0.44–1.00)
GFR calc Af Amer: 50 mL/min — ABNORMAL LOW (ref >60)
GFR calc non Af Amer: 43 mL/min — ABNORMAL LOW (ref >60)

## 2018-09-24 LAB — POCT I-STAT 7, (LYTES, BLD GAS, ICA,H+H)
Acid-Base Excess: 8 mmol/L — ABNORMAL HIGH (ref 0.0–2.0)
Bicarbonate: 32.9 mmol/L — ABNORMAL HIGH (ref 20.0–28.0)
Calcium, Ion: 1.15 mmol/L (ref 1.15–1.40)
HCT: 42 % (ref 36.0–46.0)
Hemoglobin: 14.3 g/dL (ref 12.0–15.0)
O2 Saturation: 90 %
Potassium: 3.9 mmol/L (ref 3.5–5.1)
Sodium: 140 mmol/L (ref 135–145)
TCO2: 34 mmol/L — ABNORMAL HIGH (ref 22–32)
pCO2 arterial: 45.7 mmHg (ref 32.0–48.0)
pH, Arterial: 7.466 — ABNORMAL HIGH (ref 7.350–7.450)
pO2, Arterial: 56 mmHg — ABNORMAL LOW (ref 83.0–108.0)

## 2018-09-24 LAB — CBC WITH DIFFERENTIAL/PLATELET
Abs Immature Granulocytes: 0.28 10*3/uL — ABNORMAL HIGH (ref 0.00–0.07)
Basophils Absolute: 0.1 10*3/uL (ref 0.0–0.1)
Basophils Relative: 0 %
Eosinophils Absolute: 0 10*3/uL (ref 0.0–0.5)
Eosinophils Relative: 0 %
HCT: 44.4 % (ref 36.0–46.0)
Hemoglobin: 14.3 g/dL (ref 12.0–15.0)
Immature Granulocytes: 1 %
Lymphocytes Relative: 5 %
Lymphs Abs: 1 10*3/uL (ref 0.7–4.0)
MCH: 32.4 pg (ref 26.0–34.0)
MCHC: 32.2 g/dL (ref 30.0–36.0)
MCV: 100.5 fL — ABNORMAL HIGH (ref 80.0–100.0)
Monocytes Absolute: 0.9 10*3/uL (ref 0.1–1.0)
Monocytes Relative: 4 %
Neutro Abs: 17.6 10*3/uL — ABNORMAL HIGH (ref 1.7–7.7)
Neutrophils Relative %: 90 %
Platelets: 426 10*3/uL — ABNORMAL HIGH (ref 150–400)
RBC: 4.42 MIL/uL (ref 3.87–5.11)
RDW: 13.2 % (ref 11.5–15.5)
WBC: 19.8 10*3/uL — ABNORMAL HIGH (ref 4.0–10.5)
nRBC: 0 % (ref 0.0–0.2)

## 2018-09-24 LAB — GLUCOSE, CAPILLARY: Glucose-Capillary: 162 mg/dL — ABNORMAL HIGH (ref 70–99)

## 2018-09-24 LAB — FIBRINOGEN: Fibrinogen: 596 mg/dL — ABNORMAL HIGH (ref 210–475)

## 2018-09-24 LAB — D-DIMER, QUANTITATIVE: D-Dimer, Quant: 2.53 ug/mL-FEU — ABNORMAL HIGH (ref 0.00–0.50)

## 2018-09-24 LAB — C-REACTIVE PROTEIN: CRP: 1.2 mg/dL — ABNORMAL HIGH (ref <1.0)

## 2018-09-24 LAB — FERRITIN: Ferritin: 549 ng/mL — ABNORMAL HIGH (ref 11–307)

## 2018-09-24 MED ORDER — PANTOPRAZOLE SODIUM 40 MG IV SOLR
40.0000 mg | Freq: Every day | INTRAVENOUS | Status: DC
  Administered 2018-09-24 – 2018-09-26 (×3): 40 mg via INTRAVENOUS
  Filled 2018-09-24 (×4): qty 40

## 2018-09-24 MED ORDER — SODIUM CHLORIDE 0.9 % IV SOLN
INTRAVENOUS | Status: DC | PRN
  Administered 2018-09-24: 08:00:00 via INTRAVENOUS

## 2018-09-24 MED ORDER — HYDROCOD POLST-CPM POLST ER 10-8 MG/5ML PO SUER
5.0000 mL | Freq: Two times a day (BID) | ORAL | Status: DC | PRN
  Administered 2018-09-25 – 2018-09-29 (×5): 5 mL via ORAL
  Filled 2018-09-24 (×5): qty 5

## 2018-09-24 MED ORDER — LIDOCAINE 5 % EX PTCH
1.0000 | MEDICATED_PATCH | CUTANEOUS | Status: DC
  Administered 2018-09-24 – 2018-09-25 (×2): 1 via TRANSDERMAL
  Filled 2018-09-24 (×3): qty 1

## 2018-09-24 MED ORDER — ORAL CARE MOUTH RINSE
15.0000 mL | Freq: Two times a day (BID) | OROMUCOSAL | Status: DC
  Administered 2018-09-24 – 2018-09-30 (×8): 15 mL via OROMUCOSAL

## 2018-09-24 MED ORDER — BACLOFEN 10 MG PO TABS
10.0000 mg | ORAL_TABLET | Freq: Three times a day (TID) | ORAL | Status: DC | PRN
  Administered 2018-09-25 – 2018-09-30 (×4): 10 mg via ORAL
  Filled 2018-09-24 (×5): qty 1

## 2018-09-24 MED ORDER — METHYLPREDNISOLONE SODIUM SUCC 125 MG IJ SOLR
40.0000 mg | Freq: Every day | INTRAMUSCULAR | Status: DC
  Administered 2018-09-25: 10:00:00 40 mg via INTRAVENOUS
  Filled 2018-09-24: qty 2

## 2018-09-24 MED ORDER — LEVOTHYROXINE SODIUM 100 MCG/5ML IV SOLN
37.5000 ug | Freq: Every day | INTRAVENOUS | Status: DC
  Administered 2018-09-24 – 2018-09-27 (×5): 37.5 ug via INTRAVENOUS
  Filled 2018-09-24 (×4): qty 5

## 2018-09-24 NOTE — Progress Notes (Signed)
PROGRESS NOTE    Becky Boyd  SWH:[id number] DOB: 08/26/1955 DOA: 09/17/2018 PCP: Sheliah Hatch, MD   Brief Narrative:  63 year old female PMHx rheumatoid arthritis on chronic immunosuppressive therapy (prednisone+ SulfaSalazine), Sjogren, Hashimoto thyroiditis, fibromyalgia   Admitted to the hospital on 09/17/2018 with fever, cough, nausea diarrhea and fatigue that started about 8 days prior to admission.  Her husband was hospitalized at [facility] and diagnosed with COVID-19.  In our ED she was positive for coronavirus, chest x-ray was concerning for left left infiltrate, she had a fever of 102 and developed hypoxic respiratory failure requiring oxygen supplementation.   Approximate date Covid symptoms started: 09/09/2018.     Patient has already finished a course of antibiotics with ceftriaxone and azithromycin on 4/17.     Subjective: Patient is somnolent today. Does not answer questions or follow commands    Assessment & Plan:   Principal Problem:   Fever Active Problems:   Hypothyroidism   Sjogren's syndrome (HCC)   COVID-19 virus infection   Acute on chronic respiratory failure with hypoxia (HCC)   Hypokalemia   Chronic diastolic CHF (congestive heart failure) (HCC)   Rheumatoid arthritis (HCC)  Acute respiratory failure with hypoxia/COVID-19 pneumonia - Patient has Plaquenil allergy (prolonged QT interval), therefore did not receive empirically - Received ceftriaxone + azithromycin for presumed bacterial involvement -Received Actemra on 4/18 and repeat dose on 4/20 -We will place patient in prone position for 16 hours a day.  Minimum of 2 to 3 hours at a time -Solu-Medrol 40 mg 3 times daily, now changed to once daily -We will try to avoid intubation, but will need to consider this if her hypoxia worsens    COVID-19 markers Recent Labs  Lab 09/17/18 1550 09/19/18 0615 09/20/18 0620 09/21/18 0510  LDH 392* 480* 496* 556*   Recent Labs  Lab 09/20/18  0620 09/21/18 0510 09/22/18 0450 09/23/18 0500 09/24/18 0715  FERRITIN 608* 636* 621* 551* 549*   Recent Labs  Lab 09/19/18 0615 09/20/18 0620 09/21/18 0510 09/23/18 0500 09/24/18 0715  CRP 17.5* 16.4* 8.6* 2.2* 1.2*   Recent Labs  Lab 09/20/18 0620 09/21/18 0510 09/22/18 0450 09/23/18 0500 09/24/18 0715  DDIMER 1.25* 1.48* 2.00* 2.51* 2.53*   Recent Labs  Lab 09/17/18 1550 09/19/18 0615 09/20/18 0620 09/21/18 0510 09/24/18 0715  FIBRINOGEN 790* 772* 777* 785* 596*   -QTc 449 on 4/19 -IL-6; 68.2   Oxygenation requirements -2 L on 4/18, 4 L on 4/19, 6 L on 4/20, 15 L nonrebreather 4/21.  4/22 continued 15 L high flow nasal cannula   Chronic diastolic CHF -Last echocardiogram 08/30/2014 grade 1 diastolic dysfunction normal EF -Strict in and out -Daily weight -She was receiving lasix 40mg  IV daily, but since creatinine is starting to trend up, will hold off on further diuretics for now  Rheumatoid arthritis/Sjogren's Syndrome  -SulfaSalazine thousand milligrams twice daily - Prednisone discontinued when her high-dose Solu-Medrol was started. -will need to restart on prednisone taper as her condition imrpoves  Hypothyroidism  - Synthroid 75 mcg    Hypokalemia -Resolved   Elevated LFTs -Continue to monitor    DVT prophylaxis: lovenox Code Status: Full Family Communication: discussed with daughter over the phone Disposition Plan: TBD   Consultants:  CCM  Procedures/Significant Events:     I have personally reviewed and interpreted all radiology studies and my findings are as above.  VENTILATOR SETTINGS: 4/22 HF Murdock: Nonrebreather FiO2: 15 L/min SPO2 96%   Cultures 4/16 blood right antecubital negative  4/16 blood LEFT antecubital negative   Antimicrobials: Anti-infectives (From admission, onward)   Start     Stop   09/20/18 1000  hydroxychloroquine (PLAQUENIL) tablet 200 mg  Status:  Discontinued     09/19/18 1305   09/19/18 1315   hydroxychloroquine (PLAQUENIL) tablet 400 mg  Status:  Discontinued     09/19/18 1305   09/18/18 1000  azithromycin (ZITHROMAX) 500 mg in sodium chloride 0.9 % 250 mL IVPB  Status:  Discontinued     09/19/18 1011   09/18/18 0930  cefTRIAXone (ROCEPHIN) 1 g in sodium chloride 0.9 % 100 mL IVPB  Status:  Discontinued     09/19/18 1011   09/17/18 1700  cefTRIAXone (ROCEPHIN) 1 g in sodium chloride 0.9 % 100 mL IVPB  Status:  Discontinued     09/17/18 1744   09/17/18 1700  azithromycin (ZITHROMAX) 500 mg in sodium chloride 0.9 % 250 mL IVPB  Status:  Discontinued     09/17/18 1744       Devices    LINES / TUBES:     Continuous Infusions: . sodium chloride 1,000 mL (09/22/18 1454)  . sodium chloride 10 mL/hr at 09/24/18 0800  . ondansetron (ZOFRAN) IV 8 mg (09/20/18 0641)     Objective: Vitals:   09/24/18 1115 09/24/18 1200 09/24/18 1300 09/24/18 1400  BP:   104/80 (!) 125/104  Pulse:  (!) 107 (!) 108 (!) 116  Resp:  20 (!) 28 (!) 25  Temp:  98 F (36.7 C)    TempSrc:  Oral    SpO2: 93% 94% 93% (!) 89%  Weight:      Height:        Intake/Output Summary (Last 24 hours) at 09/24/2018 1522 Last data filed at 09/24/2018 1200 Gross per 24 hour  Intake 199.54 ml  Output 1350 ml  Net -1150.46 ml   Filed Weights   09/17/18 1521 09/23/18 0951 09/24/18 0500  Weight: 64.4 kg 61 kg 61 kg    Examination:  General: she is not in distress, occasionally moans and moves spontaneously Eyes: negative scleral hemorrhage, negative anisocoria, negative icterus ENT: Negative Runny nose, negative gingival bleeding, Neck:  Negative scars, masses, torticollis, lymphadenopathy, JVD Lungs: Tachypneic, without wheezes or crackles Cardiovascular: regular, tachycardic, without murmur gallop or rub normal S1 and S2 Abdomen: negative abdominal pain, nondistended, positive soft, bowel sounds, no rebound, no ascites, no appreciable mass Extremities: No significant cyanosis, clubbing, or edema  bilateral lower extremities Skin: Negative rashes, lesions, ulcers Psychiatric:  Unable to assess due to mental status Central nervous system:  She is lethargic, does not participate in exam. She is moving all extremities independently and does not have any facial assymmetry  .     Data Reviewed: Care during the described time interval was provided by me .  I have reviewed this patient's available data, including medical history, events of note, physical examination, and all test results as part of my evaluation.   CBC: Recent Labs  Lab 09/20/18 0620 09/21/18 0510  09/22/18 0450 09/22/18 1141 09/23/18 0500 09/23/18 1034 09/24/18 0715 09/24/18 0953  WBC 4.0 6.0  --  8.4  --  11.5*  --  19.8*  --   NEUTROABS 3.2 4.9  --  7.3  --  10.3*  --  17.6*  --   HGB 11.7* 13.1   < > 13.8 14.6 14.2 15.0 14.3 14.3  HCT 38.4 42.7   < > 43.2 43.0 45.9 44.0 44.4 42.0  MCV 102.4*  102.2*  --  101.2*  --  100.7*  --  100.5*  --   PLT 228 325  --  314  --  377  --  426*  --    < > = values in this interval not displayed.   Basic Metabolic Panel: Recent Labs  Lab 09/17/18 1753  09/19/18 1415 09/20/18 0620 09/21/18 0510  09/22/18 0450 09/22/18 1141 09/23/18 0500 09/23/18 1034 09/24/18 0715 09/24/18 0953  NA  --    < > 138 140 141   < > 138 137 141 139  --  140  K  --    < > 3.4* 4.8 4.7   < > 4.1 3.8 4.0 3.7  --  3.9  CL  --    < > 100 102 102  --  99  --  99  --   --   --   CO2  --    < > --  28  --  26  --   --   --   GLUCOSE  --    < > 126* 125* 102*  --  108*  --  134*  --   --   --   BUN  --    < > --  31*  --  49*  --   --   --   CREATININE  --    < > 0.95 0.97 0.99  --  1.09*  --  1.26*  --  1.30*  --   CALCIUM  --    < > 8.4* 8.9 9.3  --  9.3  --  9.3  --   --   --   MG 1.8  --   --   --   --   --   --   --   --   --   --   --    < > = values in this interval not displayed.   GFR: Estimated Creatinine Clearance: 36.2 mL/min (A) (by C-G formula based on SCr  of 1.3 mg/dL (H)). Liver Function Tests: Recent Labs  Lab 09/19/18 1415 09/20/18 0620 09/21/18 0510 09/22/18 0450 09/23/18 0500  AST 58* 51* 46* 45* 39  ALT ALKPHOS 155* 142* 142* 136* 142*  BILITOT 0.4 0.6 0.4 0.4 1.1  PROT 6.8 7.2 6.7 6.4* 4.5*  ALBUMIN 3.4* 3.6 3.6 3.6 4.1   No results for input(s): LIPASE, AMYLASE in the last 168 hours. No results for input(s): AMMONIA in the last 168 hours. Coagulation Profile: Recent Labs  Lab 09/19/18 1415  INR 0.9   Cardiac Enzymes: Recent Labs  Lab 09/19/18 1415 09/20/18 0620 09/21/18 0510  CKTOTAL 111 75 41   BNP (last 3 results) No results for input(s): PROBNP in the last 8760 hours. HbA1C: No results for input(s): HGBA1C in the last 72 hours. CBG: Recent Labs  Lab 09/24/18 1205  GLUCAP 162*   Lipid Profile: No results for input(s): CHOL, HDL, LDLCALC, TRIG, CHOLHDL, LDLDIRECT in the last 72 hours. Thyroid Function Tests: No results for input(s): TSH, T4TOTAL, FREET4, T3FREE, THYROIDAB in the last 72 hours. Anemia Panel: Recent Labs    09/23/18 0500 09/24/18 0715  FERRITIN 551* 549*   Urine analysis:    Component Value Date/Time   COLORURINE YELLOW 09/05/2009 2343   APPEARANCEUR CLOUDY (A) 09/05/2009 2343   LABSPEC 1.027 09/05/2009 2343   PHURINE 6.0 09/05/2009 2343  GLUCOSEU 100 (A) 09/05/2009 2343   HGBUR MODERATE (A) 09/05/2009 2343   BILIRUBINUR Negative 01/27/2017 1200   KETONESUR NEGATIVE 09/05/2009 2343   PROTEINUR + 01/27/2017 1200   PROTEINUR NEGATIVE 09/05/2009 2343   UROBILINOGEN 0.2 01/27/2017 1200   UROBILINOGEN 0.2 09/05/2009 2343   NITRITE + 01/27/2017 1200   NITRITE NEGATIVE 09/05/2009 2343   LEUKOCYTESUR Small (1+) (A) 01/27/2017 1200   Sepsis Labs: (procalcitonin:4,lacticidven:4)  ) Recent Results (from the past 240 hour(s))  SARS Coronavirus 2 [facility] order, Performed in [facility] hospital lab)     Status: Abnormal   Collection Time: 09/17/18   3:50 PM  Result Value Ref Range Status   SARS Coronavirus 2 POSITIVE (A) NEGATIVE Final    Comment: RESULT CALLED TO, READ BACK BY AND VERIFIED WITH: S.BINGHAM AT 1717 ON 09/17/18 BY N.THOMPSON (NOTE) If result is NEGATIVE SARS-CoV-2 target nucleic acids are NOT DETECTED. The SARS-CoV-2 RNA is generally detectable in upper and lower  respiratory specimens during the acute phase of infection. The lowest  concentration of SARS-CoV-2 viral copies this assay can detect is 250  copies / mL. A negative result does not preclude SARS-CoV-2 infection  and should not be used as the sole basis for treatment or other  patient management decisions.  A negative result may occur with  improper specimen collection / handling, submission of specimen other  than nasopharyngeal swab, presence of viral mutation(s) within the  areas targeted by this assay, and inadequate number of viral copies  (<250 copies / mL). A negative result must be combined with clinical  observations, patient history, and epidemiological information. If result is POSITIVE SARS-CoV-2 target nucleic acids are DETEC TED. The SARS-CoV-2 RNA is generally detectable in upper and lower  respiratory specimens during the acute phase of infection.  Positive  results are indicative of active infection with SARS-CoV-2.  Clinical  correlation with patient history and other diagnostic information is  necessary to determine patient infection status.  Positive results do  not rule out bacterial infection or co-infection with other viruses. If result is PRESUMPTIVE POSTIVE SARS-CoV-2 nucleic acids MAY BE PRESENT.   A presumptive positive result was obtained on the submitted specimen  and confirmed on repeat testing.  While 2019 novel coronavirus  (SARS-CoV-2) nucleic acids may be present in the submitted sample  additional confirmatory testing may be necessary for epidemiological  and / or clinical management purposes  to differentiate between   SARS-CoV-2 and other Sarbecovirus currently known to infect humans.  If clinically indicated additional testing with an alternate test  methodology (LAB7 453) is advised. The SARS-CoV-2 RNA is generally  detectable in upper and lower respiratory specimens during the acute  phase of infection. The expected result is Negative. Fact Sheet for Patients:  BoilerBrush.com.cy Fact Sheet for Healthcare Providers: https://pope.com/ This test is not yet approved or cleared by the [place] FDA and has been authorized for detection and/or diagnosis of SARS-CoV-2 by FDA under an Emergency Use Authorization (EUA).  This EUA will remain in effect (meaning this test can be used) for the duration of the COVID-19 declaration under Section 564(b)(1) of the Act, 21 U.S.C. section 360bbb-3(b)(1), unless the authorization is terminated or revoked sooner. Performed at [facility], [address], [place], [place] [address]   Blood Culture (routine x 2)     Status: None   Collection Time: 09/17/18  3:50 PM  Result Value Ref Range Status   Specimen Description   Final  BLOOD RIGHT ANTECUBITAL Performed at [facility], [address] [place], [place] [address]    Special Requests   Final    BOTTLES DRAWN AEROBIC AND ANAEROBIC Blood Culture adequate volume Performed at [facility], [address] [place], [place] [address]    Culture   Final    NO GROWTH 5 DAYS Performed at [facility], [address] [place], [place] [address]    Report Status 09/22/2018 FINAL  Final  Blood Culture (routine x 2)     Status: None   Collection Time: 09/17/18  4:33 PM  Result Value Ref Range Status   Specimen Description   Final    BLOOD LEFT ANTECUBITAL Performed at [facility], [address] [place], [place] [address]    Special Requests   Final    BOTTLES DRAWN AEROBIC  AND ANAEROBIC Blood Culture adequate volume Performed at [facility], [address] [place], [place] [address]    Culture   Final    NO GROWTH 5 DAYS Performed at [facility], [address] [place], [place] [address]    Report Status 09/22/2018 FINAL  Final         Radiology Studies: No results found.      Scheduled Meds: . atorvastatin  20 mg Oral Daily  . DULoxetine  60 mg Oral Daily  . enoxaparin (LOVENOX) injection  40 mg Subcutaneous Q24H  . feeding supplement (ENSURE ENLIVE)  237 mL Oral BID BM  . furosemide  40 mg Intravenous Daily  . levothyroxine  75 mcg Oral QAC breakfast  . [START ON 09/25/2018] methylPREDNISolone (SOLU-MEDROL) injection  40 mg Intravenous Daily  . pantoprazole  40 mg Oral Daily  . sulfaSALAzine  1,000 mg Oral BID  . vitamin C  500 mg Oral Daily  . zinc sulfate  220 mg Oral Daily   Continuous Infusions: . sodium chloride 1,000 mL (09/22/18 1454)  . sodium chloride 10 mL/hr at 09/24/18 0800  . ondansetron [facility](ZOFRAN) IV 8 mg (09/20/18 0641)     LOS: 7 days   The patient is critically ill with multiple organ systems failure and requires high complexity decision making for assessment and support, frequent evaluation and titration of therapies, application of advanced monitoring technologies and extensive interpretation of multiple databases. Critical Care Time devoted to patient care services described in this note  Time spent: 40 minutes     Erick BlinksJehanzeb Clarabelle Oscarson, MD [facility]   If 7PM-7AM, please contact night-coverage [email or web address]  09/24/2018, 3:22 PM

## 2018-09-24 NOTE — Progress Notes (Signed)
Rn called to see if we can some of her meds to IV since pt's mental status is not stable for PO. After reviewing, we will change levothyroxine PO>>37. IV (equivalent dose) and Protonix 40mg  PO to IV.   Ulyses Southward, PharmD, BCIDP, AAHIVP, CPP Infectious Disease Pharmacist 09/24/2018 4:12 PM

## 2018-09-24 NOTE — Progress Notes (Signed)
Spoke with pt daughter this morning. Did facetime with detailed update. Pt acknowledged her daughters voice with simple responses. Daughthter very appreciative of the care being provided to her mom. We will reupdate again this afternoon.

## 2018-09-24 NOTE — Consult Note (Signed)
PULMONARY / CRITICAL CARE MEDICINE   NAME:  Becky Boyd, MRN:  [id number], DOB:  05-23-1956, LOS: 7 ADMISSION DATE:  09/17/2018, CONSULTATION DATE:  09/24/2018  REFERRING MD:  Tyler Pita CHIEF COMPLAINT:  resp distress  BRIEF HISTORY:    63 year old woman with rheumatoid arthritis on chronic prednisone admitted 4/16 with CO VID pneumonia.  Her husband was hospitalized at [facility] and tested positive for coronavirus  HISTORY OF PRESENT ILLNESS   64 year old woman who presented on 4/16 with fever cough, fatigue, nausea and diarrhea.  Her husband was initially hospitalized at [facility] and tested positive for coronavirus and was discharged home by the time of her admission.  Admission chest x-ray assisted left lower lobe infiltrate, she was febrile to 102 when developed increasing hypoxia requiring high flow nasal cannula.  She was treated with a course of ceftriaxone and azithromycin.  Received Actemra on 4/18 and repeat dose in 4/20 SIGNIFICANT PAST MEDICAL HISTORY    Past Medical History:  Diagnosis Date  . Aneurysm (HCC)   . Anorexia   . Dysphagia   . Fibromyalgia   . Fibromyalgia   . Hashimoto thyroiditis   . Hypothyroidism   . Kidney stones   . Lupus (HCC)   . Migraines   . Rheumatoid arthritis (HCC)   . Sjogren's disease (HCC)      SIGNIFICANT EVENTS:  4/18, 4/20 >> Actemra dosing STUDIES:    CULTURES:  4/16 blood culture>> ng 4/16 nasopharyngeal swab SARS COV-2 >> POS  ANTIBIOTICS:  Ceftriaxone 4/16 >>4/17 Azithromycin 4/16 >>4/17  LINES/TUBES:    CONSULTANTS:   SUBJECTIVE:  Less responsive this morning, received a small dose of morphine last night Mains on high flow nasal cannula 15 L  CONSTITUTIONAL: BP (!) 129/95   Pulse (!) 106   Temp 99 F (37.2 C) (Oral)   Resp 20   Ht 5\' 3"  (1.6 m)   Wt 61 kg   SpO2 (!) 89%   BMI 23.82 kg/m   I/O last 3 completed shifts: In: 150 [P.O.:150] Out: 1775 [Urine:1775]        PHYSICAL EXAM: General:  Acutely ill-appearing, appears older than stated age, no distress Neuro: Less responsive, mumbles name, does not follow commands, localizes HEENT: Mild pallor, no icterus Cardiovascular: S1-S2 regular, sinus on monitor Lungs: Decreased breath sounds bilateral, no rhonchi Abdomen: Soft nontender abdomen Musculoskeletal: No deformity Skin: No rash  Chest x-ray 4/20 personally reviewed which shows bilateral lower lobe interstitial changes with breast implants  RESOLVED PROBLEM LIST   ASSESSMENT AND PLAN   Acute respiratory failure with hypoxia -Continue high flow nasal cannula -If worsens will need mechanical ventilation -Pronating as much as possible up to 16 hours daily  COVID pneumonia-supportive care Received Actemra and allergic to Plaquenil Decrease Solu-Medrol 40 every 24, no evidence of bronchospasm Okay to continue vitamin C and zinc  Acute encephalopathy-?  Related to oversedation, no hypercarbia on ABG which I reviewed Discontinue lorazepam and morphine orders until she is more awake Avoid other sedating medications such as Phenergan  SUMMARY OF TODAY'S PLAN:  Hypoxia is unchanged from yesterday but mental status is worse, likely metabolic encephalopathy since she is nonfocal, related to meds  Best Practice / Goals of Care / Disposition.   DVT PROPHYLAXIS: Lovenox SUP: Protonix NUTRITION: N.p.o. MOBILITY: Bedrest GOALS OF CARE: Pending FAMILY DISCUSSIONS: Per [facility] DISPOSITION ICU  LABS  Glucose No results for input(s): GLUCAP in the last 168 hours.  BMET Recent Labs  Lab 09/21/18 0510  09/22/18 0450  09/23/18 0500 09/23/18 1034 09/24/18 0715 09/24/18 0953  NA 141   < > 138   < > 141 139  --  140  K 4.7   < > 4.1   < > 4.0 3.7  --  3.9  CL 102  --  99  --  99  --   --   --   CO2 27  --  28  --  26  --   --   --   BUN 19  --  31*  --  49*  --   --   --   CREATININE 0.99  --  1.09*  --  1.26*  --  1.30*  --   GLUCOSE 102*  --  108*  --  134*  --   --    --    < > = values in this interval not displayed.    Liver Enzymes Recent Labs  Lab 09/21/18 0510 09/22/18 0450 09/23/18 0500  AST 46* 45* 39  ALT 21 19 17   ALKPHOS 142* 136* 142*  BILITOT 0.4 0.4 1.1  ALBUMIN 3.6 3.6 4.1    Electrolytes Recent Labs  Lab 09/17/18 1753  09/21/18 0510 09/22/18 0450 09/23/18 0500  CALCIUM  --    < > 9.3 9.3 9.3  MG 1.8  --   --   --   --    < > = values in this interval not displayed.    CBC Recent Labs  Lab 09/22/18 0450  09/23/18 0500 09/23/18 1034 09/24/18 0715 09/24/18 0953  WBC 8.4  --  11.5*  --  19.8*  --   HGB 13.8   < > 14.2 15.0 14.3 14.3  HCT 43.2   < > 45.9 44.0 44.4 42.0  PLT 314  --  377  --  426*  --    < > = values in this interval not displayed.    ABG Recent Labs  Lab 09/22/18 1141 09/23/18 1034 09/24/18 0953  PHART 7.460* 7.404 7.466*  PCO2ART 42.5 47.6 45.7  PO2ART 51.0* 114.0* 56.0*    Coag's Recent Labs  Lab 09/19/18 1415  APTT 33  INR 0.9    Sepsis Markers Recent Labs  Lab 09/17/18 1550  LATICACIDVEN 1.5  PROCALCITON <0.10    Cardiac Enzymes No results for input(s): TROPONINI, PROBNP in the last 168 hours.  PAST MEDICAL HISTORY :   She  has a past medical history of Aneurysm (HCC), Anorexia, Dysphagia, Fibromyalgia, Fibromyalgia, Hashimoto thyroiditis, Hypothyroidism, Kidney stones, Lupus (HCC), Migraines, Rheumatoid arthritis (HCC), and Sjogren's disease (HCC).  PAST SURGICAL HISTORY:  She  has a past surgical history that includes Cholecystectomy; Abdominal hysterectomy; kidney stone; cervical rods; l5-s1 surgery; Cervical fusion (2004); Cervical fusion (1990); and Lumbar microdiscectomy (2004).  Allergies  Allergen Reactions  . Plaquenil [Hydroxychloroquine Sulfate] Other (See Comments)    Prolonged QT interval, skin feels like bee stings    No current facility-administered medications on file prior to encounter.    Current Outpatient Medications on File Prior to Encounter   Medication Sig  . acetaminophen (TYLENOL) 325 MG tablet Take 650 mg by mouth every 6 (six) hours as needed for fever or headache.  Marland Kitchen atorvastatin (LIPITOR) 20 MG tablet TAKE ONE TABLET BY MOUTH ONE TIME DAILY  (Patient taking differently: Take 20 mg by mouth daily. )  . baclofen (LIORESAL) 10 MG tablet take 1 tablet by mouth 3 times daily  . DULoxetine (CYMBALTA) 60 MG capsule Take 60 mg  by mouth daily.   Marland Kitchen. levothyroxine (SYNTHROID, LEVOTHROID) 75 MCG tablet Take 75 mcg by mouth daily.    . meloxicam (MOBIC) 15 MG tablet Take 15 mg by mouth daily as needed for pain (takes daily).   . predniSONE (DELTASONE) 5 MG tablet Take 5 mg by mouth daily with breakfast.  . sulfaSALAzine (AZULFIDINE) 500 MG tablet 1,000 mg 2 (two) times daily.   Marland Kitchen. HYDROcodone-acetaminophen (NORCO/VICODIN) 5-325 MG per tablet Take 1-2 tablets by mouth every 6 (six) hours as needed. (Patient not taking: Reported on 09/17/2018)    FAMILY HISTORY:   Her family history includes Aneurysm in her paternal aunt; Breast cancer in her paternal aunt; COPD in her brother and mother; Diabetes in her sister and another family member; Heart disease in her father.  SOCIAL HISTORY:  She  reports that she has never smoked. She has never used smokeless tobacco. She reports that she does not drink alcohol or use drugs.  REVIEW OF SYSTEMS:    Unable to obtain since poorly responsive   Cyril Mourningakesh Kristyana Notte MD. Tonny BollmanFCCP. [facility] Pager [phone] If no response call [phone]   09/24/2018

## 2018-09-25 ENCOUNTER — Inpatient Hospital Stay (HOSPITAL_COMMUNITY): Payer: Medicare Other

## 2018-09-25 DIAGNOSIS — J1289 Other viral pneumonia: Secondary | ICD-10-CM

## 2018-09-25 LAB — FERRITIN: Ferritin: 630 ng/mL — ABNORMAL HIGH (ref 11–307)

## 2018-09-25 LAB — CBC WITH DIFFERENTIAL/PLATELET
Abs Immature Granulocytes: 0.2 10*3/uL — ABNORMAL HIGH (ref 0.00–0.07)
Basophils Absolute: 0.1 10*3/uL (ref 0.0–0.1)
Basophils Relative: 0 %
Eosinophils Absolute: 0 10*3/uL (ref 0.0–0.5)
Eosinophils Relative: 0 %
HCT: 48.2 % — ABNORMAL HIGH (ref 36.0–46.0)
Hemoglobin: 15.2 g/dL — ABNORMAL HIGH (ref 12.0–15.0)
Immature Granulocytes: 1 %
Lymphocytes Relative: 6 %
Lymphs Abs: 1 10*3/uL (ref 0.7–4.0)
MCH: 32.1 pg (ref 26.0–34.0)
MCHC: 31.5 g/dL (ref 30.0–36.0)
MCV: 101.7 fL — ABNORMAL HIGH (ref 80.0–100.0)
Monocytes Absolute: 0.7 10*3/uL (ref 0.1–1.0)
Monocytes Relative: 5 %
Neutro Abs: 13.5 10*3/uL — ABNORMAL HIGH (ref 1.7–7.7)
Neutrophils Relative %: 88 %
Platelets: 468 10*3/uL — ABNORMAL HIGH (ref 150–400)
RBC: 4.74 MIL/uL (ref 3.87–5.11)
RDW: 13.2 % (ref 11.5–15.5)
WBC: 15.5 10*3/uL — ABNORMAL HIGH (ref 4.0–10.5)
nRBC: 0 % (ref 0.0–0.2)

## 2018-09-25 LAB — BASIC METABOLIC PANEL
Anion gap: 16 — ABNORMAL HIGH (ref 5–15)
BUN: 69 mg/dL — ABNORMAL HIGH (ref 8–23)
CO2: 29 mmol/L (ref 22–32)
Calcium: 9.6 mg/dL (ref 8.9–10.3)
Chloride: 101 mmol/L (ref 98–111)
Creatinine, Ser: 1.17 mg/dL — ABNORMAL HIGH (ref 0.44–1.00)
GFR calc Af Amer: 57 mL/min — ABNORMAL LOW (ref >60)
GFR calc non Af Amer: 49 mL/min — ABNORMAL LOW (ref >60)
Glucose, Bld: 121 mg/dL — ABNORMAL HIGH (ref 70–99)
Potassium: 3.4 mmol/L — ABNORMAL LOW (ref 3.5–5.1)
Sodium: 146 mmol/L — ABNORMAL HIGH (ref 135–145)

## 2018-09-25 LAB — D-DIMER, QUANTITATIVE: D-Dimer, Quant: 2.3 ug/mL-FEU — ABNORMAL HIGH (ref 0.00–0.50)

## 2018-09-25 LAB — GLUCOSE, CAPILLARY: Glucose-Capillary: 133 mg/dL — ABNORMAL HIGH (ref 70–99)

## 2018-09-25 LAB — C-REACTIVE PROTEIN: CRP: 0.9 mg/dL (ref <1.0)

## 2018-09-25 LAB — FIBRINOGEN: Fibrinogen: 598 mg/dL — ABNORMAL HIGH (ref 210–475)

## 2018-09-25 MED ORDER — LIP MEDEX EX OINT
TOPICAL_OINTMENT | CUTANEOUS | Status: DC | PRN
  Filled 2018-09-25: qty 7

## 2018-09-25 MED ORDER — PREDNISONE 10 MG PO TABS
40.0000 mg | ORAL_TABLET | Freq: Every day | ORAL | Status: DC
  Administered 2018-09-26 – 2018-09-28 (×3): 40 mg via ORAL
  Filled 2018-09-25 (×3): qty 2

## 2018-09-25 MED ORDER — SALINE SPRAY 0.65 % NA SOLN
1.0000 | NASAL | Status: DC | PRN
  Filled 2018-09-25: qty 44

## 2018-09-25 NOTE — Progress Notes (Signed)
Face time pt daughter Becky Boyd. This was a welcomed surprise for her since Becky Boyd has been more alert this am. She has completed her Respiratory excersices ( flutter vave & IS),  Had a bath with hair washed. Drinking fluids successfully and beginning to come down slightly on required O2 %. Becky Boyd held the phone herself for the entire conversation. Once she began to get tired we had lots of well wishes and luv until the next planned face time @ 1600 with other family present. Obtained pts family pics and I will create her a phot collage. Looking forward to a good day!

## 2018-09-25 NOTE — Progress Notes (Signed)
Face time with Becky Boyd family this afternoon. Her daughter two sons, grandson and Husband were outside her house for the video chat. This is the first time she was able to see her husband. She and her family were so happy. Becky Boyd has had a good productive day and has tired herself out. Rest is due and needed. We will contact family tomorrow around 10:30 when Becky Boyd is most energetic. Lots of luv from both sides Given.

## 2018-09-25 NOTE — Consult Note (Signed)
PULMONARY / CRITICAL CARE MEDICINE   NAME:  Becky Boyd, MRN:  [id number], DOB:  1956-06-02, LOS: 8 ADMISSION DATE:  09/17/2018, CONSULTATION DATE:  09/25/2018  REFERRING MD:  Tyler Pita CHIEF COMPLAINT:  resp distress  BRIEF HISTORY:    63 year old woman with rheumatoid arthritis on chronic prednisone admitted 4/16 with CO VID pneumonia.  Her husband was hospitalized at [facility] and tested positive for coronavirus   SIGNIFICANT EVENTS:  4/18, 4/20 >> Actemra dosing 4/23 poor mental status due to morphine/Ativan STUDIES:    CULTURES:  4/16 blood culture>> ng 4/16 nasopharyngeal swab SARS COV-2 >> POS  ANTIBIOTICS:  Ceftriaxone 4/16 >>4/17 Azithromycin 4/16 >>4/17  LINES/TUBES:    CONSULTANTS:   SUBJECTIVE:   Afebrile, breathing okay Down to 8 L high flow nasal cannula Denies chest pain   CONSTITUTIONAL: BP (!) 132/91   Pulse (!) 113   Temp (!) 96.4 F (35.8 C) (Axillary)   Resp (!) 22   Ht 5\' 3"  (1.6 m)   Wt 61 kg   SpO2 94%   BMI 23.82 kg/m   I/O last 3 completed shifts: In: 119.5 [I.V.:119.5] Out: 1625 [Urine:1625]        PHYSICAL EXAM: General: Acutely ill-appearing, appears older than stated age, no distress Neuro: Awake and interactive, nonfocal HEENT: Mild pallor, no icterus Cardiovascular: S1-S2 tacky, sinus on monitor Lungs: Decreased breath sounds bilateral, no rhonchi Abdomen: Soft nontender abdomen Musculoskeletal: No deformity Skin: No rash  Chest x-ray 4/24 personally reviewed which shows bilateral lower lobe interstitial changes, breast implants  RESOLVED PROBLEM LIST   ASSESSMENT AND PLAN   Acute respiratory failure with hypoxia -improving -Continue high flow nasal cannula -Prone position as much as possible   COVID pneumonia-supportive care Received Actemra and allergic to Plaquenil Decrease Solu-Medrol 40 every 24, no evidence of bronchospasm Okay to continue vitamin C and zinc  Acute encephalopathy-?  Related to  oversedation, no hypercarbia on ABG  Discontinue lorazepam and morphine orders until she is more awake Avoid other sedating medications such as Phenergan  SUMMARY OF TODAY'S PLAN:  Much improved mental status and hypoxia compared to yesterday, hopefully she has turned the corner  [employer] / Goals of Care / Disposition.   DVT PROPHYLAXIS: Lovenox SUP: Protonix NUTRITION: N.p.o. MOBILITY: Bedrest GOALS OF CARE: Pending FAMILY DISCUSSIONS: Per [facility] DISPOSITION ICU    Cyril Mourning MD. FCCP. [facility] Pager [phone] If no response call [phone]   09/25/2018

## 2018-09-25 NOTE — Progress Notes (Addendum)
Pt moe awake and asking for ice water.and ablde to follow simple commands.Grips are equal and moderate in strength. Opens eyes on command.

## 2018-09-25 NOTE — Progress Notes (Signed)
Called Mrs. Becky Boyd daughter and provided her with updates. Very appreciative of care being provided. Mrs Becky Boyd loved the [employer] that her family had brought her during the day. Plan on facetiming again tomorrow at 1030 with the family.

## 2018-09-25 NOTE — Progress Notes (Signed)
PROGRESS NOTE    Becky Boyd  XBJ:[id number] DOB: 12-09-1955 DOA: 09/17/2018 PCP: Sheliah Hatch, MD   Brief Narrative:  63 year old female PMHx rheumatoid arthritis on chronic immunosuppressive therapy (prednisone+ SulfaSalazine), Sjogren, Hashimoto thyroiditis, fibromyalgia   Admitted to the hospital on 09/17/2018 with fever, cough, nausea diarrhea and fatigue that started about 8 days prior to admission.  Her husband was hospitalized at [facility] and diagnosed with COVID-19.  In our ED she was positive for coronavirus, chest x-ray was concerning for left left infiltrate, she had a fever of 102 and developed hypoxic respiratory failure requiring oxygen supplementation.   Approximate date Covid symptoms started: 09/09/2018.     Patient has already finished a course of antibiotics with ceftriaxone and azithromycin on 4/17.     Subjective: Awake and alert, feeling better today, shortness of breath improving  Assessment & Plan:   Principal Problem:   Fever Active Problems:   Hypothyroidism   Sjogren's syndrome (HCC)   COVID-19 virus infection   Acute on chronic respiratory failure with hypoxia (HCC)   Hypokalemia   Chronic diastolic CHF (congestive heart failure) (HCC)   Rheumatoid arthritis (HCC)  Acute respiratory failure with hypoxia/COVID-19 pneumonia - Patient has Plaquenil allergy (prolonged QT interval), therefore did not receive empirically - Received ceftriaxone + azithromycin for presumed bacterial involvement -Received Actemra on 4/18 and repeat dose on 4/20 -We will place patient in prone position for 16 hours a day.  Minimum of 2 to 3 hours at a time -oxygen requirements have been weaned down from 15L to 5L -She was receiving solumedrol, but since she is clinically improving, she has been transitioned to oral prednisone taper     COVID-19 markers Recent Labs  Lab 09/19/18 0615 09/20/18 0620 09/21/18 0510  LDH 480* 496* 556*   Recent Labs  Lab  09/21/18 0510 09/22/18 0450 09/23/18 0500 09/24/18 0715 09/25/18 0125  FERRITIN 636* 621* 551* 549* 630*   Recent Labs  Lab 09/20/18 0620 09/21/18 0510 09/23/18 0500 09/24/18 0715 09/25/18 0125  CRP 16.4* 8.6* 2.2* 1.2* 0.9   Recent Labs  Lab 09/21/18 0510 09/22/18 0450 09/23/18 0500 09/24/18 0715 09/25/18 0125  DDIMER 1.48* 2.00* 2.51* 2.53* 2.30*   Recent Labs  Lab 09/19/18 0615 09/20/18 0620 09/21/18 0510 09/24/18 0715 09/25/18 0125  FIBRINOGEN 772* 777* 785* 596* 598*   -QTc 449 on 4/19 -IL-6; 68.2    Chronic diastolic CHF -Last echocardiogram 08/30/2014 grade 1 diastolic dysfunction normal EF -Strict in and out -Daily weight -She was receiving lasix  IV daily, but since creatinine is starting to trend up, will hold off on further diuretics for now  Rheumatoid arthritis/Sjogren's Syndrome  -SulfaSalazine thousand milligrams twice daily - she was receiving solumedrol. Since she is improving, will transition to prednisone taper -will taper prednisone back to her chronic dose of  daily  Hypothyroidism  - Synthroid 75 mcg    Hypokalemia -Resolved   Elevated LFTs -Continue to monitor    DVT prophylaxis: lovenox Code Status: Full Family Communication: discussed with daughter over the phone Disposition Plan: TBD   Consultants:  CCM  Procedures/Significant Events:     I have personally reviewed and interpreted all radiology studies and my findings are as above.   Cultures 4/16 blood right antecubital negative 4/16 blood LEFT antecubital negative   Antimicrobials: Anti-infectives (From admission, onward)   Start     Stop   09/20/18 1000  hydroxychloroquine (PLAQUENIL) tablet 200 mg  Status:  Discontinued     09/19/18  1305   09/19/18 1315  hydroxychloroquine (PLAQUENIL) tablet 400 mg  Status:  Discontinued     09/19/18 1305   09/18/18 1000  azithromycin (ZITHROMAX) 500 mg in sodium chloride 0.9 % 250 mL IVPB  Status:   Discontinued     09/19/18 1011   09/18/18 0930  cefTRIAXone (ROCEPHIN) 1 g in sodium chloride 0.9 % 100 mL IVPB  Status:  Discontinued     09/19/18 1011   09/17/18 1700  cefTRIAXone (ROCEPHIN) 1 g in sodium chloride 0.9 % 100 mL IVPB  Status:  Discontinued     09/17/18 1744   09/17/18 1700  azithromycin (ZITHROMAX) 500 mg in sodium chloride 0.9 % 250 mL IVPB  Status:  Discontinued     09/17/18 1744       Devices    LINES / TUBES:     Continuous Infusions:  sodium chloride 1,000 mL (09/22/18 1454)   sodium chloride 10 mL/hr at 09/25/18 0400   ondansetron (ZOFRAN) IV 8 mg (09/20/18 0641)     Objective: Vitals:   09/25/18 0200 09/25/18 0300 09/25/18 0400 09/25/18 0751  BP: 139/84 120/78 114/80 (!) 132/91  Pulse: (!) 109 100 (!) 104 (!) 113  Resp: 11 (!) 21 (!) 23 (!) 22  Temp:   (!) 96.4 F (35.8 C)   TempSrc:   Axillary   SpO2: 95% 100% 98% 94%  Weight:      Height:        Intake/Output Summary (Last 24 hours) at 09/25/2018 1016 Last data filed at 09/25/2018 0400 Gross per 24 hour  Intake 109.47 ml  Output 800 ml  Net -690.53 ml   Filed Weights   09/17/18 1521 09/23/18 0951 09/24/18 0500  Weight: 64.4 kg 61 kg 61 kg    Examination:  General exam: Alert, awake, oriented x 3 Respiratory system: Clear to auscultation. Respiratory effort normal. Cardiovascular system: tachycardic, regular. No murmurs, rubs, gallops. Gastrointestinal system: Abdomen is nondistended, soft and nontender. No organomegaly or masses felt. Normal bowel sounds heard. Central nervous system: Alert and oriented. No focal neurological deficits. Extremities: No C/C/E, +pedal pulses Skin: No rashes, lesions or ulcers Psychiatry: Judgement and insight appear normal. Mood & affect appropriate.    Data Reviewed: Care during the described time interval was provided by me .  I have reviewed this patient's available data, including medical history, events of note, physical examination, and all  test results as part of my evaluation.   CBC: Recent Labs  Lab 09/21/18 0510  09/22/18 0450  09/23/18 0500 09/23/18 1034 09/24/18 0715 09/24/18 0953 09/25/18 0125  WBC 6.0  --  8.4  --  11.5*  --  19.8*  --  15.5*  NEUTROABS 4.9  --  7.3  --  10.3*  --  17.6*  --  13.5*  HGB 13.1   < > 13.8   < > 14.2 15.0 14.3 14.3 15.2*  HCT 42.7   < > 43.2   < > 45.9 44.0 44.4 42.0 48.2*  MCV 102.2*  --  101.2*  --  100.7*  --  100.5*  --  101.7*  PLT 325  --  314  --  377  --  426*  --  468*   < > = values in this interval not displayed.   Basic Metabolic Panel: Recent Labs  Lab 09/20/18 0620 09/21/18 0510  09/22/18 0450 09/22/18 1141 09/23/18 0500 09/23/18 1034 09/24/18 0715 09/24/18 0953 09/25/18 0125  NA 140 141   < >  138 137 141 139  --  140 146*  K 4.8 4.7   < > 4.1 3.8 4.0 3.7  --  3.9 3.4*  CL 102 102  --  99  --  99  --   --   --  101  CO2 29 27  --  28  --  26  --   --   --  29  GLUCOSE 125* 102*  --  108*  --  134*  --   --   --  121*  BUN 18 19  --  31*  --  49*  --   --   --  69*  CREATININE 0.97 0.99  --  1.09*  --  1.26*  --  1.30*  --  1.17*  CALCIUM 8.9 9.3  --  9.3  --  9.3  --   --   --  9.6   < > = values in this interval not displayed.   GFR: Estimated Creatinine Clearance: 40.2 mL/min (A) (by C-G formula based on SCr of 1.17 mg/dL (H)). Liver Function Tests: Recent Labs  Lab 09/19/18 1415 09/20/18 0620 09/21/18 0510 09/22/18 0450 09/23/18 0500  AST 58* 51* 46* 45* 39  ALT 26 24 21 19 17   ALKPHOS 155* 142* 142* 136* 142*  BILITOT 0.4 0.6 0.4 0.4 1.1  PROT 6.8 7.2 6.7 6.4* 4.5*  ALBUMIN 3.4* 3.6 3.6 3.6 4.1   No results for input(s): LIPASE, AMYLASE in the last 168 hours. No results for input(s): AMMONIA in the last 168 hours. Coagulation Profile: Recent Labs  Lab 09/19/18 1415  INR 0.9   Cardiac Enzymes: Recent Labs  Lab 09/19/18 1415 09/20/18 0620 09/21/18 0510  CKTOTAL 111 75 41   BNP (last 3 results) No results for input(s): PROBNP  in the last 8760 hours. HbA1C: No results for input(s): HGBA1C in the last 72 hours. CBG: Recent Labs  Lab 09/24/18 1205 09/25/18 0742  GLUCAP 162* 133*   Lipid Profile: No results for input(s): CHOL, HDL, LDLCALC, TRIG, CHOLHDL, LDLDIRECT in the last 72 hours. Thyroid Function Tests: No results for input(s): TSH, T4TOTAL, FREET4, T3FREE, THYROIDAB in the last 72 hours. Anemia Panel: Recent Labs    09/24/18 0715 09/25/18 0125  FERRITIN 549* 630*   Urine analysis:    Component Value Date/Time   COLORURINE YELLOW 09/05/2009 2343   APPEARANCEUR CLOUDY (A) 09/05/2009 2343   LABSPEC 1.027 09/05/2009 2343   PHURINE 6.0 09/05/2009 2343   GLUCOSEU 100 (A) 09/05/2009 2343   HGBUR MODERATE (A) 09/05/2009 2343   BILIRUBINUR Negative 01/27/2017 1200   KETONESUR NEGATIVE 09/05/2009 2343   PROTEINUR + 01/27/2017 1200   PROTEINUR NEGATIVE 09/05/2009 2343   UROBILINOGEN 0.2 01/27/2017 1200   UROBILINOGEN 0.2 09/05/2009 2343   NITRITE + 01/27/2017 1200   NITRITE NEGATIVE 09/05/2009 2343   LEUKOCYTESUR Small (1+) (A) 01/27/2017 1200   Sepsis Labs: @LABRCNTIP (procalcitonin:4,lacticidven:4)  ) Recent Results (from the past 240 hour(s))  SARS Coronavirus 2 [facility] order, Performed in [facility] hospital lab)     Status: Abnormal   Collection Time: 09/17/18  3:50 PM  Result Value Ref Range Status   SARS Coronavirus 2 POSITIVE (A) NEGATIVE Final    Comment: RESULT CALLED TO, READ BACK BY AND VERIFIED WITH: S.BINGHAM AT 1717 ON 09/17/18 BY N.THOMPSON (NOTE) If result is NEGATIVE SARS-CoV-2 target nucleic acids are NOT DETECTED. The SARS-CoV-2 RNA is generally detectable in upper and lower  respiratory specimens during the acute phase of  infection. The lowest  concentration of SARS-CoV-2 viral copies this assay can detect is 250  copies / mL. A negative result does not preclude SARS-CoV-2 infection  and should not be used as the sole basis for treatment or other  patient  management decisions.  A negative result may occur with  improper specimen collection / handling, submission of specimen other  than nasopharyngeal swab, presence of viral mutation(s) within the  areas targeted by this assay, and inadequate number of viral copies  (<250 copies / mL). A negative result must be combined with clinical  observations, patient history, and epidemiological information. If result is POSITIVE SARS-CoV-2 target nucleic acids are DETEC TED. The SARS-CoV-2 RNA is generally detectable in upper and lower  respiratory specimens during the acute phase of infection.  Positive  results are indicative of active infection with SARS-CoV-2.  Clinical  correlation with patient history and other diagnostic information is  necessary to determine patient infection status.  Positive results do  not rule out bacterial infection or co-infection with other viruses. If result is PRESUMPTIVE POSTIVE SARS-CoV-2 nucleic acids MAY BE PRESENT.   A presumptive positive result was obtained on the submitted specimen  and confirmed on repeat testing.  While 2019 novel coronavirus  (SARS-CoV-2) nucleic acids may be present in the submitted sample  additional confirmatory testing may be necessary for epidemiological  and / or clinical management purposes  to differentiate between  SARS-CoV-2 and other Sarbecovirus currently known to infect humans.  If clinically indicated additional testing with an alternate test  methodology (LAB7 453) is advised. The SARS-CoV-2 RNA is generally  detectable in upper and lower respiratory specimens during the acute  phase of infection. The expected result is Negative. Fact Sheet for Patients:  BoilerBrush.com.cyhttps://www.fda.gov/media/136312/download Fact Sheet for Healthcare Providers: https://pope.com/https://www.fda.gov/media/136313/download This test is not yet approved or cleared by the [place] FDA and has been authorized for detection and/or diagnosis of SARS-CoV-2 by FDA under  an Emergency Use Authorization (EUA).  This EUA will remain in effect (meaning this test can be used) for the duration of the COVID-19 declaration under Section 564(b)(1) of the Act, 21 U.S.C. section 360bbb-3(b)(1), unless the authorization is terminated or revoked sooner. Performed at [facility], [address] [place], [place] [address]   Blood Culture (routine x 2)     Status: None   Collection Time: 09/17/18  3:50 PM  Result Value Ref Range Status   Specimen Description   Final    BLOOD RIGHT ANTECUBITAL Performed at [facility], [address] [place], [place] [address]    Special Requests   Final    BOTTLES DRAWN AEROBIC AND ANAEROBIC Blood Culture adequate volume Performed at [facility], [address] [place], [place] [address]    Culture   Final    NO GROWTH 5 DAYS Performed at [facility], [address] [place], [place] [address]    Report Status 09/22/2018 FINAL  Final  Blood Culture (routine x 2)     Status: None   Collection Time: 09/17/18  4:33 PM  Result Value Ref Range Status   Specimen Description   Final    BLOOD LEFT ANTECUBITAL Performed at [facility], [address] [place], [place] [address]    Special Requests   Final    BOTTLES DRAWN AEROBIC AND ANAEROBIC Blood Culture adequate volume Performed at [facility], [address] [place], [place] [address]  Culture   Final    NO GROWTH 5 DAYS Performed at [facility], [address], [place], [place] [address]    Report Status 09/22/2018 FINAL  Final         Radiology Studies: Dg Chest Port 1 View  Result Date: 09/25/2018 CLINICAL DATA:  63 year old female with COVID-19, respiratory failure. EXAM: PORTABLE CHEST 1 VIEW COMPARISON:  09/21/2018 and earlier. FINDINGS: Portable AP upright view at 0407 hours. Peripheral and basilar predominant bilateral coarse pulmonary  interstitial opacity persists. Lung volumes and ventilation are stable. No pneumothorax or pleural effusion. Normal cardiac size and mediastinal contours. Visualized tracheal air column is within normal limits. Incidental calcified breast implants. Prior ACDF. Negative visible bowel gas pattern. IMPRESSION: Stable ventilation since 09/21/2018 with basilar predominant pulmonary interstitial opacity compatible with COVID-19 pneumonia. Electronically Signed   By: Odessa Fleming M.D.   On: 09/25/2018 06:56        Scheduled Meds:  atorvastatin  20 mg Oral Daily   DULoxetine  60 mg Oral Daily   enoxaparin (LOVENOX) injection  40 mg Subcutaneous Q24H   feeding supplement (ENSURE ENLIVE)  237 mL Oral BID BM   levothyroxine  37.5 mcg Intravenous Daily   lidocaine  1 patch Transdermal Q24H   mouth rinse  15 mL Mouth Rinse BID   pantoprazole (PROTONIX) IV  40 mg Intravenous QHS   [START ON 09/26/2018] predniSONE  40 mg Oral Q breakfast   sulfaSALAzine  1,000 mg Oral BID   vitamin C  500 mg Oral Daily   zinc sulfate  220 mg Oral Daily   Continuous Infusions:  sodium chloride 1,000 mL (09/22/18 1454)   sodium chloride 10 mL/hr at 09/25/18 0400   ondansetron (ZOFRAN) IV 8 mg (09/20/18 0641)     LOS: 8 days   The patient is critically ill with multiple organ systems failure and requires high complexity decision making for assessment and support, frequent evaluation and titration of therapies, application of advanced monitoring technologies and extensive interpretation of multiple databases. Critical Care Time devoted to patient care services described in this note  Time spent: 40 minutes     Erick Blinks, MD [facility]   If 7PM-7AM, please contact night-coverage [email or web address]  09/25/2018, 10:16 AM

## 2018-09-26 ENCOUNTER — Inpatient Hospital Stay (HOSPITAL_COMMUNITY): Payer: Medicare Other

## 2018-09-26 DIAGNOSIS — J9621 Acute and chronic respiratory failure with hypoxia: Secondary | ICD-10-CM

## 2018-09-26 DIAGNOSIS — R5081 Fever presenting with conditions classified elsewhere: Secondary | ICD-10-CM

## 2018-09-26 DIAGNOSIS — J80 Acute respiratory distress syndrome: Secondary | ICD-10-CM

## 2018-09-26 LAB — CBC WITH DIFFERENTIAL/PLATELET
Abs Immature Granulocytes: 0.21 10*3/uL — ABNORMAL HIGH (ref 0.00–0.07)
Basophils Absolute: 0 10*3/uL (ref 0.0–0.1)
Basophils Relative: 0 %
Eosinophils Absolute: 0.2 10*3/uL (ref 0.0–0.5)
Eosinophils Relative: 2 %
HCT: 44.3 % (ref 36.0–46.0)
Hemoglobin: 13.9 g/dL (ref 12.0–15.0)
Immature Granulocytes: 2 %
Lymphocytes Relative: 12 %
Lymphs Abs: 1.2 10*3/uL (ref 0.7–4.0)
MCH: 31.6 pg (ref 26.0–34.0)
MCHC: 31.4 g/dL (ref 30.0–36.0)
MCV: 100.7 fL — ABNORMAL HIGH (ref 80.0–100.0)
Monocytes Absolute: 0.6 10*3/uL (ref 0.1–1.0)
Monocytes Relative: 6 %
Neutro Abs: 8 10*3/uL — ABNORMAL HIGH (ref 1.7–7.7)
Neutrophils Relative %: 78 %
Platelets: 372 10*3/uL (ref 150–400)
RBC: 4.4 MIL/uL (ref 3.87–5.11)
RDW: 12.9 % (ref 11.5–15.5)
WBC: 10.2 10*3/uL (ref 4.0–10.5)
nRBC: 0 % (ref 0.0–0.2)

## 2018-09-26 LAB — BASIC METABOLIC PANEL
Anion gap: 12 (ref 5–15)
BUN: 40 mg/dL — ABNORMAL HIGH (ref 8–23)
CO2: 29 mmol/L (ref 22–32)
Calcium: 9.1 mg/dL (ref 8.9–10.3)
Chloride: 97 mmol/L — ABNORMAL LOW (ref 98–111)
Creatinine, Ser: 1.03 mg/dL — ABNORMAL HIGH (ref 0.44–1.00)
GFR calc Af Amer: >60 mL/min (ref >60)
GFR calc non Af Amer: 57 mL/min — ABNORMAL LOW (ref >60)
Glucose, Bld: 118 mg/dL — ABNORMAL HIGH (ref 70–99)
Potassium: 2.9 mmol/L — ABNORMAL LOW (ref 3.5–5.1)
Sodium: 138 mmol/L (ref 135–145)

## 2018-09-26 LAB — FIBRINOGEN: Fibrinogen: 464 mg/dL (ref 210–475)

## 2018-09-26 LAB — MAGNESIUM: Magnesium: 3.1 mg/dL — ABNORMAL HIGH (ref 1.7–2.4)

## 2018-09-26 LAB — FERRITIN: Ferritin: 570 ng/mL — ABNORMAL HIGH (ref 11–307)

## 2018-09-26 LAB — D-DIMER, QUANTITATIVE: D-Dimer, Quant: 2.47 ug/mL-FEU — ABNORMAL HIGH (ref 0.00–0.50)

## 2018-09-26 LAB — C-REACTIVE PROTEIN: CRP: <0.8 mg/dL (ref <1.0)

## 2018-09-26 MED ORDER — SODIUM CHLORIDE 0.9 % IV SOLN
8.0000 mg | Freq: Four times a day (QID) | INTRAVENOUS | Status: DC | PRN
  Filled 2018-09-26: qty 4

## 2018-09-26 MED ORDER — LIDOCAINE 5 % EX PTCH
1.0000 | MEDICATED_PATCH | CUTANEOUS | Status: DC
  Administered 2018-09-27 – 2018-09-30 (×4): 1 via TRANSDERMAL
  Filled 2018-09-26 (×6): qty 1

## 2018-09-26 MED ORDER — POTASSIUM CHLORIDE 20 MEQ PO PACK: 40.0000 meq | PACK | Freq: Two times a day (BID) | ORAL | Status: DC

## 2018-09-26 MED ORDER — ONDANSETRON HCL 4 MG/2ML IJ SOLN: 4.0000 mg | Freq: Four times a day (QID) | INTRAMUSCULAR | Status: DC | PRN

## 2018-09-26 MED ORDER — POTASSIUM CHLORIDE 20 MEQ/15ML (10%) PO SOLN
40.0000 meq | Freq: Two times a day (BID) | ORAL | Status: DC
  Administered 2018-09-26 – 2018-09-27 (×3): 40 meq via ORAL
  Filled 2018-09-26 (×4): qty 30

## 2018-09-26 MED ORDER — POTASSIUM CHLORIDE CRYS ER 20 MEQ PO TBCR
40.0000 meq | EXTENDED_RELEASE_TABLET | ORAL | Status: AC
  Administered 2018-09-26 (×2): 40 meq via ORAL
  Filled 2018-09-26 (×2): qty 2

## 2018-09-26 MED ORDER — ONDANSETRON HCL 4 MG/2ML IJ SOLN
4.0000 mg | Freq: Once | INTRAMUSCULAR | Status: AC
  Administered 2018-09-26: 4 mg via INTRAVENOUS
  Filled 2018-09-26: qty 2

## 2018-09-26 MED ORDER — LOPERAMIDE HCL 2 MG PO CAPS
2.0000 mg | ORAL_CAPSULE | ORAL | Status: DC | PRN
  Filled 2018-09-26: qty 1

## 2018-09-26 MED ORDER — MAGNESIUM SULFATE 2 GM/50ML IV SOLN
2.0000 g | Freq: Once | INTRAVENOUS | Status: AC
  Administered 2018-09-26: 05:00:00 2 g via INTRAVENOUS
  Filled 2018-09-26: qty 50

## 2018-09-26 MED ORDER — ONDANSETRON HCL 4 MG PO TABS: 4.0000 mg | ORAL_TABLET | Freq: Four times a day (QID) | ORAL | Status: DC | PRN

## 2018-09-26 MED ORDER — GUAIFENESIN 100 MG/5ML PO SOLN
15.0000 mL | Freq: Four times a day (QID) | ORAL | Status: DC
  Administered 2018-09-26 – 2018-10-01 (×14): 300 mg via ORAL
  Filled 2018-09-26 (×16): qty 15

## 2018-09-26 NOTE — Progress Notes (Signed)
eLink Physician-Brief Progress Note Patient Name: Becky Boyd DOB: 02-Mar-1956 MRN: [id number]   Date of Service  09/26/2018  HPI/Events of Note  Hypokalemia and hypomag  eICU Interventions  Potassium and Mag replaced     Intervention Category Intermediate Interventions: Electrolyte abnormality - evaluation and management  Becky Boyd 09/26/2018, 4:20 AM

## 2018-09-26 NOTE — Progress Notes (Signed)
Attempted to place pt on HFNC 15L. Pt oxygen would not get above 87%. Pt placed back on non-rebreather 15L. Pt has strong cough and is able to clear sputum. Will continue to assess for readiness to get off mask.

## 2018-09-26 NOTE — Progress Notes (Signed)
NAME:  Becky Boyd, MRN:  [id number], DOB:  05/09/1956, LOS: 9 ADMISSION DATE:  09/17/2018, CONSULTATION DATE:  09/25/2018 REFERRING MD:  Joseph Art, CHIEF COMPLAINT:  Dyspnea   Brief History   63 year old woman with rheumatoid arthritis on chronic prednisone admitted 4/16 with CO VID pneumonia.  Her husband was hospitalized at [facility] and tested positive for coronavirus   Past Medical History  Sjogren's disease RA SLE Hypthyroidism Fibromyalgia Dysphagia Migraines   Significant Hospital Events   4/16 admission 4/18 Actemra 4/20 Actemra  Consults:  PCCM  Procedures:    Significant Diagnostic Tests:  2016 CT chest images reviewed: normal pulmonary parenchyma 2016 Esophogram: mild reflux, small hiatal hernia,   Micro Data:  4/16 blood culture>> ng 4/16 nasopharyngeal swab SARS COV-2 >> POS  Antimicrobials:  Ceftriaxone 4/16 >>4/17 Azithromycin 4/16 >>4/17  Interim history/subjective:  remains on non-rebreather mask Oxygenation worse overnight Doesn't like potassium  Objective   Blood pressure 119/79, pulse 93, temperature 98.2 F (36.8 C), temperature source Oral, resp. rate (!) 24, height 5\' 3"  (1.6 m), weight 61.3 kg, SpO2 97 %.    FiO2 (%):  [100 %] 100 %   Intake/Output Summary (Last 24 hours) at 09/26/2018 3818 Last data filed at 09/26/2018 0700 Gross per 24 hour  Intake 1859.84 ml  Output 1560 ml  Net 299.84 ml   Filed Weights   09/23/18 0951 09/24/18 0500 09/26/18 0500  Weight: 61 kg 61 kg 61.3 kg    Examination:  General:  In bed on vent HENT: NCAT ETT in place PULM: CTA B, vent supported breathing CV: RRR, no mgr GI: BS+, soft, nontender MSK: normal bulk and tone Neuro: sedated on vent   4/24 CXR images personally reviewed: bibasilar infiltrates, no effusion  Resolved Hospital Problem list     Assessment & Plan:  Acute respiratory failure with hypoxemia COVID 19: Worsening oxygenation, however no change in work of breathing, I am  concerned about the change in her oxygenation but at this point she does not need to be intubated I am concerned that mucus plugging is contributing to her hypoxemia Has received IV Solu-Medrol, some diuresis, Actemra x2 doses, was allergic to hydroxychloroquine so this was not administered Plan: Add scheduled guaifenesin Continue flutter valve, incentive spirometry Strongly encouraged to change position today and encouraged her to lay in the prone position Close monitoring in ICU, may need intubation Indication for intubation would be increased work of breathing rather than hypoxemia alone CXR portable now Out of bed to chair Tolerate O2 saturation in 80's as long as no increased work of breathing Continue prednisone Consider CT chest  Sjogrens, RA, SLE Continue prednisone  Sinus congestion: Continue saline sprays  Acute encephalopathy due to narcotics Improved Minimize sedation  Best practice:  Diet: diet as tolerated Pain/Anxiety/Delirium protocol (if indicated): no VAP protocol (if indicated): n/a DVT prophylaxis: yes GI prophylaxis: Pantoprazole for stress ulcer prophylaxis Glucose control: monitor Mobility: out of bed Code Status: full Family Communication: will contact Disposition: remain in ICU  Labs   CBC: Recent Labs  Lab 09/22/18 0450  09/23/18 0500 09/23/18 1034 09/24/18 0715 09/24/18 0953 09/25/18 0125 09/26/18 0135  WBC 8.4  --  11.5*  --  19.8*  --  15.5* 10.2  NEUTROABS 7.3  --  10.3*  --  17.6*  --  13.5* 8.0*  HGB 13.8   < > 14.2 15.0 14.3 14.3 15.2* 13.9  HCT 43.2   < > 45.9 44.0 44.4 42.0 48.2* 44.3  MCV  101.2*  --  100.7*  --  100.5*  --  101.7* 100.7*  PLT 314  --  377  --  426*  --  468* 372   < > = values in this interval not displayed.    Basic Metabolic Panel: Recent Labs  Lab 09/21/18 0510  09/22/18 0450  09/23/18 0500 09/23/18 1034 09/24/18 0715 09/24/18 0953 09/25/18 0125 09/26/18 0135  NA 141   < > 138   < > 141 139  --   140 146* 138  K 4.7   < > 4.1   < > 4.0 3.7  --  3.9 3.4* 2.9*  CL 102  --  99  --  99  --   --   --  101 97*  CO2 27  --  28  --  26  --   --   --  29 29  GLUCOSE 102*  --  108*  --  134*  --   --   --  121* 118*  BUN 19  --  31*  --  49*  --   --   --  69* 40*  CREATININE 0.99  --  1.09*  --  1.26*  --  1.30*  --  1.17* 1.03*  CALCIUM 9.3  --  9.3  --  9.3  --   --   --  9.6 9.1   < > = values in this interval not displayed.   GFR: Estimated Creatinine Clearance: 45.6 mL/min (A) (by C-G formula based on SCr of 1.03 mg/dL (H)). Recent Labs  Lab 09/23/18 0500 09/24/18 0715 09/25/18 0125 09/26/18 0135  WBC 11.5* 19.8* 15.5* 10.2    Liver Function Tests: Recent Labs  Lab 09/19/18 1415 09/20/18 0620 09/21/18 0510 09/22/18 0450 09/23/18 0500  AST 58* 51* 46* 45* 39  ALT 26 24 21 19 17   ALKPHOS 155* 142* 142* 136* 142*  BILITOT 0.4 0.6 0.4 0.4 1.1  PROT 6.8 7.2 6.7 6.4* 4.5*  ALBUMIN 3.4* 3.6 3.6 3.6 4.1   No results for input(s): LIPASE, AMYLASE in the last 168 hours. No results for input(s): AMMONIA in the last 168 hours.  ABG    Component Value Date/Time   PHART 7.466 (H) 09/24/2018 0953   PCO2ART 45.7 09/24/2018 0953   PO2ART 56.0 (L) 09/24/2018 0953   HCO3 32.9 (H) 09/24/2018 0953   TCO2 34 (H) 09/24/2018 0953   O2SAT 90.0 09/24/2018 0953     Coagulation Profile: Recent Labs  Lab 09/19/18 1415  INR 0.9    Cardiac Enzymes: Recent Labs  Lab 09/19/18 1415 09/20/18 0620 09/21/18 0510  CKTOTAL 111 75 41    HbA1C: Hgb A1c MFr Bld  Date/Time Value Ref Range Status  09/09/2009 05:47 AM  4.6 - 6.1 % Final   5.9 (NOTE) The ADA recommends the following therapeutic goal for glycemic control related to Hgb A1c measurement: Goal of therapy: <6.5 Hgb A1c  Reference: American Diabetes Association: Clinical Practice Recommendations 2010, Diabetes Care, 2010, 33: (Suppl  1).    CBG: Recent Labs  Lab 09/24/18 1205 09/25/18 0742  GLUCAP 162* 133*     Review of Systems:   Gen: Denies fever, chills, weight change, + fatigue, night sweats HEENT: Denies blurred vision, double vision, hearing loss, tinnitus, sinus congestion, rhinorrhea, sore throat, neck stiffness, dysphagia,  PULM: per HPI   Past Medical History  She,  has a past medical history of Aneurysm (HCC), Anorexia, Dysphagia, Fibromyalgia, Fibromyalgia, Hashimoto thyroiditis, Hypothyroidism,  Kidney stones, Lupus (HCC), Migraines, Rheumatoid arthritis (HCC), and Sjogren's disease (HCC).   Surgical History    Past Surgical History:  Procedure Laterality Date  . ABDOMINAL HYSTERECTOMY    . CERVICAL FUSION  2004   C4-C5 with Instrumentation  . CERVICAL FUSION  1990   C3-C4  . cervical rods    . CHOLECYSTECTOMY    . kidney stone    . l5-s1 surgery    . LUMBAR MICRODISCECTOMY  2004   Bilateral L5-S1     Social History   reports that she has never smoked. She has never used smokeless tobacco. She reports that she does not drink alcohol or use drugs.   Family History   Her family history includes Aneurysm in her paternal aunt; Breast cancer in her paternal aunt; COPD in her brother and mother; Diabetes in her sister and another family member; Heart disease in her father.   Allergies Allergies  Allergen Reactions  . Plaquenil [Hydroxychloroquine Sulfate] Other (See Comments)    Prolonged QT interval, skin feels like bee stings     Home Medications  Prior to Admission medications   Medication Sig Start Date End Date Taking? Authorizing Provider  acetaminophen (TYLENOL) 325 MG tablet Take 650 mg by mouth every 6 (six) hours as needed for fever or headache.   Yes [provider]  atorvastatin (LIPITOR) 20 MG tablet TAKE ONE TABLET BY MOUTH ONE TIME DAILY  Patient taking differently: Take 20 mg by mouth daily.  09/02/18  Yes Sheliah Hatch, MD  baclofen (LIORESAL) 10 MG tablet take 1 tablet by mouth 3 times daily 03/13/18  Yes Sheliah Hatch, MD   DULoxetine (CYMBALTA) 60 MG capsule Take 60 mg by mouth daily.  11/01/14  Yes [provider]  levothyroxine (SYNTHROID, LEVOTHROID) 75 MCG tablet Take 75 mcg by mouth daily.     Yes [provider]  meloxicam (MOBIC) 15 MG tablet Take 15 mg by mouth daily as needed for pain (takes daily).    Yes [provider]  predniSONE (DELTASONE) 5 MG tablet Take 5 mg by mouth daily with breakfast.   Yes [provider]  sulfaSALAzine (AZULFIDINE) 500 MG tablet 1,000 mg 2 (two) times daily.  01/06/17  Yes [provider]  HYDROcodone-acetaminophen (NORCO/VICODIN) 5-325 MG per tablet Take 1-2 tablets by mouth every 6 (six) hours as needed. Patient not taking: Reported on 09/17/2018 10/11/13   Blake Divine, MD     Critical care time: 40 minutes     Heber Nesquehoning, MD [facility] [facility] Pager: [phone] Cell: [phone] If no response, call [phone]

## 2018-09-26 NOTE — Progress Notes (Signed)
Spoke with Becky Boyd family via Face time. Reviewed her morning accomplishments. Pt up OOB in chair and transitioned back to HFNC. YAY!  She held the phone for the entire video chat. Her family were all pleased and expressed luving updates. We are excited and realistic of the continual fight against this virus. We know Becky Reganne has it in her to win!

## 2018-09-26 NOTE — Progress Notes (Signed)
Pt currently watching Netflix on the unit Ipad. She is in great spirits and very pleasant.

## 2018-09-26 NOTE — Progress Notes (Signed)
PROGRESS NOTE    Becky HewLinda Boyd  RUE:[id number] DOB: [birth year] DOA: 09/17/2018 PCP: Sheliah Hatchabori, Katherine E, MD   Brief Narrative:  63 year old female PMHx rheumatoid arthritis on chronic immunosuppressive therapy (prednisone+ SulfaSalazine), Sjogren, Hashimoto thyroiditis, fibromyalgia   Admitted to the hospital on 09/17/2018 with fever, cough, nausea diarrhea and fatigue that started about 8 days prior to admission.  [facility].  In our ED she was positive for coronavirus, chest x-ray was concerning for left left infiltrate, she had a fever of 102 and developed hypoxic respiratory failure requiring oxygen supplementation.   Approximate date Covid symptoms started: 09/09/2018.     Patient has already finished a course of antibiotics with ceftriaxone and azithromycin on 4/17.     Subjective: Became hypoxic yesterday and had to be put back on NRB mask. She is coughing, but having difficulty expectorating sputum.  Assessment & Plan:   Principal Problem:   Fever Active Problems:   Hypothyroidism   Sjogren's syndrome (HCC)   COVID-19 virus infection   Acute on chronic respiratory failure with hypoxia (HCC)   Hypokalemia   Chronic diastolic CHF (congestive heart failure) (HCC)   Rheumatoid arthritis (HCC)  Acute respiratory failure with hypoxia/COVID-19 pneumonia - Patient has Plaquenil allergy (prolonged QT interval), therefore did not receive empirically - Received ceftriaxone + azithromycin for presumed bacterial involvement -Received Actemra on 4/18 and repeat dose on 4/20 -We will place patient in prone position for 16 hours a day.  Minimum of 2 to 3 hours at a time -She was receiving solumedrol, but since she is clinically improving, she has been transitioned to oral prednisone taper -oxygen requirements have been fluctuating. Overnight she became hypoxic and needed to go back on NRB mask. It is likely that her respiratory  secretions and difficulty expectorating these secretions is playing a role -pulmonary hygiene encouraged, she will sit up in a chair today     COVID-19 markers Recent Labs  Lab 09/20/18 0620 09/21/18 0510  LDH 496* 556*   Recent Labs  Lab 09/22/18 0450 09/23/18 0500 09/24/18 0715 09/25/18 0125 09/26/18 0135  FERRITIN 621* 551* 549* 630* 570*   Recent Labs  Lab 09/21/18 0510 09/23/18 0500 09/24/18 0715 09/25/18 0125 09/26/18 0135  CRP 8.6* 2.2* 1.2* 0.9 <0.8   Recent Labs  Lab 09/22/18 0450 09/23/18 0500 09/24/18 0715 09/25/18 0125 09/26/18 0135  DDIMER 2.00* 2.51* 2.53* 2.30* 2.47*   Recent Labs  Lab 09/20/18 0620 09/21/18 0510 09/24/18 0715 09/25/18 0125 09/26/18 0135  FIBRINOGEN 777* 785* 596* 598* 464   -QTc 449 on 4/19 -IL-6; 68.2    Chronic diastolic CHF -Last echocardiogram 08/30/2014 grade 1 diastolic dysfunction normal EF -Strict in and out -Daily weight -She was receiving lasix 40mg  IV daily, but since creatinine started to trend up, further diuretics were held  Rheumatoid arthritis/Sjogren's Syndrome  -SulfaSalazine thousand milligrams twice daily - she was receiving solumedrol. Since she is improving, will transition to prednisone taper -will taper prednisone back to her chronic dose of 5mg  daily  Hypothyroidism  - Synthroid 75 mcg    Hypokalemia -replace, check magnesium   Elevated LFTs -Continue to monitor    DVT prophylaxis: lovenox Code Status: Full Family Communication: discussed with daughter over the phone 4/25 Disposition Plan: TBD   Consultants:  CCM  Procedures/Significant Events:     I have personally reviewed and interpreted all radiology studies and my findings are as above.   Cultures 4/16 blood right antecubital negative  4/16 blood LEFT antecubital negative   Antimicrobials: Anti-infectives (From admission, onward)   Start     Stop   09/20/18 1000  hydroxychloroquine (PLAQUENIL) tablet 200 mg   Status:  Discontinued     09/19/18 1305   09/19/18 1315  hydroxychloroquine (PLAQUENIL) tablet 400 mg  Status:  Discontinued     09/19/18 1305   09/18/18 1000  azithromycin (ZITHROMAX) 500 mg in sodium chloride 0.9 % 250 mL IVPB  Status:  Discontinued     09/19/18 1011   09/18/18 0930  cefTRIAXone (ROCEPHIN) 1 g in sodium chloride 0.9 % 100 mL IVPB  Status:  Discontinued     09/19/18 1011   09/17/18 1700  cefTRIAXone (ROCEPHIN) 1 g in sodium chloride 0.9 % 100 mL IVPB  Status:  Discontinued     09/17/18 1744   09/17/18 1700  azithromycin (ZITHROMAX) 500 mg in sodium chloride 0.9 % 250 mL IVPB  Status:  Discontinued     09/17/18 1744       Devices    LINES / TUBES:     Continuous Infusions:  sodium chloride 1,000 mL (09/22/18 1454)   sodium chloride 10 mL/hr at 09/26/18 0700   ondansetron (ZOFRAN) IV       Objective: Vitals:   09/26/18 0720 09/26/18 0800 09/26/18 0900 09/26/18 1000  BP: 119/79 107/71  121/85  Pulse: 93 91 96 100  Resp: (!) 24 19 (!) 22 19  Temp:      TempSrc:      SpO2: 97% (!) 86% 100% 100%  Weight:      Height:        Intake/Output Summary (Last 24 hours) at 09/26/2018 1131 Last data filed at 09/26/2018 0700 Gross per 24 hour  Intake 1470.02 ml  Output 1210 ml  Net 260.02 ml   Filed Weights   09/23/18 0951 09/24/18 0500 09/26/18 0500  Weight: 61 kg 61 kg 61.3 kg    Examination:  General exam: Alert, awake, oriented x 3 Respiratory system: scattered rhonchi. Respiratory effort normal. Cardiovascular system:RRR. No murmurs, rubs, gallops. Gastrointestinal system: Abdomen is nondistended, soft and nontender. No organomegaly or masses felt. Normal bowel sounds heard. Central nervous system: Alert and oriented. No focal neurological deficits. Extremities: No C/C/E, +pedal pulses Skin: No rashes, lesions or ulcers Psychiatry: Judgement and insight appear normal. Mood & affect appropriate.     Data Reviewed: Care during the described  time interval was provided by me .  I have reviewed this patient's available data, including medical history, events of note, physical examination, and all test results as part of my evaluation.   CBC: Recent Labs  Lab 09/22/18 0450  09/23/18 0500 09/23/18 1034 09/24/18 0715 09/24/18 0953 09/25/18 0125 09/26/18 0135  WBC 8.4  --  11.5*  --  19.8*  --  15.5* 10.2  NEUTROABS 7.3  --  10.3*  --  17.6*  --  13.5* 8.0*  HGB 13.8   < > 14.2 15.0 14.3 14.3 15.2* 13.9  HCT 43.2   < > 45.9 44.0 44.4 42.0 48.2* 44.3  MCV 101.2*  --  100.7*  --  100.5*  --  101.7* 100.7*  PLT 314  --  377  --  426*  --  468* 372   < > = values in this interval not displayed.   Basic Metabolic Panel: Recent Labs  Lab 09/21/18 0510  09/22/18 0450  09/23/18 0500 09/23/18 1034 09/24/18 0715 09/24/18 0953 09/25/18 0125 09/26/18 0135  NA 141   < >  138   < > 141 139  --  140 146* 138  K 4.7   < > 4.1   < > 4.0 3.7  --  3.9 3.4* 2.9*  CL 102  --  99  --  99  --   --   --  101 97*  CO2 27  --  28  --  26  --   --   --  29 29  GLUCOSE 102*  --  108*  --  134*  --   --   --  121* 118*  BUN 19  --  31*  --  49*  --   --   --  69* 40*  CREATININE 0.99  --  1.09*  --  1.26*  --  1.30*  --  1.17* 1.03*  CALCIUM 9.3  --  9.3  --  9.3  --   --   --  9.6 9.1   < > = values in this interval not displayed.   GFR: Estimated Creatinine Clearance: 45.6 mL/min (A) (by C-G formula based on SCr of 1.03 mg/dL (H)). Liver Function Tests: Recent Labs  Lab 09/19/18 1415 09/20/18 0620 09/21/18 0510 09/22/18 0450 09/23/18 0500  AST 58* 51* 46* 45* 39  ALT 26 24 21 19 17   ALKPHOS 155* 142* 142* 136* 142*  BILITOT 0.4 0.6 0.4 0.4 1.1  PROT 6.8 7.2 6.7 6.4* 4.5*  ALBUMIN 3.4* 3.6 3.6 3.6 4.1   No results for input(s): LIPASE, AMYLASE in the last 168 hours. No results for input(s): AMMONIA in the last 168 hours. Coagulation Profile: Recent Labs  Lab 09/19/18 1415  INR 0.9   Cardiac Enzymes: Recent Labs  Lab  09/19/18 1415 09/20/18 0620 09/21/18 0510  CKTOTAL 111 75 41   BNP (last 3 results) No results for input(s): PROBNP in the last 8760 hours. HbA1C: No results for input(s): HGBA1C in the last 72 hours. CBG: Recent Labs  Lab 09/24/18 1205 09/25/18 0742  GLUCAP 162* 133*   Lipid Profile: No results for input(s): CHOL, HDL, LDLCALC, TRIG, CHOLHDL, LDLDIRECT in the last 72 hours. Thyroid Function Tests: No results for input(s): TSH, T4TOTAL, FREET4, T3FREE, THYROIDAB in the last 72 hours. Anemia Panel: Recent Labs    09/25/18 0125 09/26/18 0135  FERRITIN 630* 570*   Urine analysis:    Component Value Date/Time   COLORURINE YELLOW 09/05/2009 2343   APPEARANCEUR CLOUDY (A) 09/05/2009 2343   LABSPEC 1.027 09/05/2009 2343   PHURINE 6.0 09/05/2009 2343   GLUCOSEU 100 (A) 09/05/2009 2343   HGBUR MODERATE (A) 09/05/2009 2343   BILIRUBINUR Negative 01/27/2017 1200   KETONESUR NEGATIVE 09/05/2009 2343   PROTEINUR + 01/27/2017 1200   PROTEINUR NEGATIVE 09/05/2009 2343   UROBILINOGEN 0.2 01/27/2017 1200   UROBILINOGEN 0.2 09/05/2009 2343   NITRITE + 01/27/2017 1200   NITRITE NEGATIVE 09/05/2009 2343   LEUKOCYTESUR Small (1+) (A) 01/27/2017 1200   Sepsis Labs: @LABRCNTIP (procalcitonin:4,lacticidven:4)  ) Recent Results (from the past 240 hour(s))  SARS Coronavirus 2 [facility] order, Performed in [facility] hospital lab)     Status: Abnormal   Collection Time: 09/17/18  3:50 PM  Result Value Ref Range Status   SARS Coronavirus 2 POSITIVE (A) NEGATIVE Final    Comment: RESULT CALLED TO, READ BACK BY AND VERIFIED WITH: S.BINGHAM AT 1717 ON 09/17/18 BY N.THOMPSON (NOTE) If result is NEGATIVE SARS-CoV-2 target nucleic acids are NOT DETECTED. The SARS-CoV-2 RNA is generally detectable in upper and lower  respiratory specimens during the acute phase of infection. The lowest  concentration of SARS-CoV-2 viral copies this assay can detect is 250  copies / mL. A negative result  does not preclude SARS-CoV-2 infection  and should not be used as the sole basis for treatment or other  patient management decisions.  A negative result may occur with  improper specimen collection / handling, submission of specimen other  than nasopharyngeal swab, presence of viral mutation(s) within the  areas targeted by this assay, and inadequate number of viral copies  (<250 copies / mL). A negative result must be combined with clinical  observations, patient history, and epidemiological information. If result is POSITIVE SARS-CoV-2 target nucleic acids are DETEC TED. The SARS-CoV-2 RNA is generally detectable in upper and lower  respiratory specimens during the acute phase of infection.  Positive  results are indicative of active infection with SARS-CoV-2.  Clinical  correlation with patient history and other diagnostic information is  necessary to determine patient infection status.  Positive results do  not rule out bacterial infection or co-infection with other viruses. If result is PRESUMPTIVE POSTIVE SARS-CoV-2 nucleic acids MAY BE PRESENT.   A presumptive positive result was obtained on the submitted specimen  and confirmed on repeat testing.  While 2019 novel coronavirus  (SARS-CoV-2) nucleic acids may be present in the submitted sample  additional confirmatory testing may be necessary for epidemiological  and / or clinical management purposes  to differentiate between  SARS-CoV-2 and other Sarbecovirus currently known to infect humans.  If clinically indicated additional testing with an alternate test  methodology (LAB7 453) is advised. The SARS-CoV-2 RNA is generally  detectable in upper and lower respiratory specimens during the acute  phase of infection. The expected result is Negative. Fact Sheet for Patients:  BoilerBrush.com.cy Fact Sheet for Healthcare Providers: https://pope.com/ This test is not yet approved or  cleared by the [place] FDA and has been authorized for detection and/or diagnosis of SARS-CoV-2 by FDA under an Emergency Use Authorization (EUA).  This EUA will remain in effect (meaning this test can be used) for the duration of the COVID-19 declaration under Section 564(b)(1) of the Act, 21 U.S.C. section 360bbb-3(b)(1), unless the authorization is terminated or revoked sooner. Performed at [facility], [address], [place], [place] [address]   Blood Culture (routine x 2)     Status: None   Collection Time: 09/17/18  3:50 PM  Result Value Ref Range Status   Specimen Description   Final    BLOOD RIGHT ANTECUBITAL Performed at [facility], [address], [place], [place] [address]    Special Requests   Final    BOTTLES DRAWN AEROBIC AND ANAEROBIC Blood Culture adequate volume Performed at [facility], [address] [place], [place] [address]    Culture   Final    NO GROWTH 5 DAYS Performed at [facility], [address] [place], [place] [address]    Report Status 09/22/2018 FINAL  Final  Blood Culture (routine x 2)     Status: None   Collection Time: 09/17/18  4:33 PM  Result Value Ref Range Status   Specimen Description   Final    BLOOD LEFT ANTECUBITAL Performed at [facility], [address] [place], [place] [address]    Special Requests   Final    BOTTLES DRAWN AEROBIC AND ANAEROBIC Blood Culture adequate volume Performed at [facility], [address] [place], [place] [address]    Culture   Final    NO GROWTH 5 DAYS Performed at [facility], [address] [place], [place] [address]    Report Status 09/22/2018 FINAL  Final         Radiology Studies: Dg Chest Port 1 View  Result Date: 09/25/2018 CLINICAL DATA:  63 year old female with COVID-19, respiratory failure. EXAM: PORTABLE CHEST 1 VIEW COMPARISON:  09/21/2018 and earlier.  FINDINGS: Portable AP upright view at 0407 hours. Peripheral and basilar predominant bilateral coarse pulmonary interstitial opacity persists. Lung volumes and ventilation are stable. No pneumothorax or pleural effusion. Normal cardiac size and mediastinal contours. Visualized tracheal air column is within normal limits. Incidental calcified breast implants. Prior ACDF. Negative visible bowel gas pattern. IMPRESSION: Stable ventilation since 09/21/2018 with basilar predominant pulmonary interstitial opacity compatible with COVID-19 pneumonia. Electronically Signed   By: Odessa Fleming M.D.   On: 09/25/2018 06:56        Scheduled Meds:  atorvastatin  20 mg Oral Daily   DULoxetine  60 mg Oral Daily   enoxaparin (LOVENOX) injection  40 mg Subcutaneous Q24H   feeding supplement (ENSURE ENLIVE)  237 mL Oral BID BM   levothyroxine  37.5 mcg Intravenous Daily   lidocaine  1 patch Transdermal Q24H   mouth rinse  15 mL Mouth Rinse BID   ondansetron (ZOFRAN) IV  4 mg Intravenous Once   pantoprazole (PROTONIX) IV  40 mg Intravenous QHS   predniSONE  40 mg Oral Q breakfast   sulfaSALAzine  1,000 mg Oral BID   vitamin C  500 mg Oral Daily   zinc sulfate  220 mg Oral Daily   Continuous Infusions:  sodium chloride 1,000 mL (09/22/18 1454)   sodium chloride 10 mL/hr at 09/26/18 0700   ondansetron (ZOFRAN) IV       LOS: 9 days   The patient is critically ill with multiple organ systems failure and requires high complexity decision making for assessment and support, frequent evaluation and titration of therapies, application of advanced monitoring technologies and extensive interpretation of multiple databases. Critical Care Time devoted to patient care services described in this note   Time spent: 40 minutes     Erick Blinks, MD [facility]   If 7PM-7AM, please contact night-coverage [email or web address]  09/26/2018, 11:31 AM

## 2018-09-27 LAB — CBC WITH DIFFERENTIAL/PLATELET
Abs Immature Granulocytes: 0.28 10*3/uL — ABNORMAL HIGH (ref 0.00–0.07)
Basophils Absolute: 0.1 10*3/uL (ref 0.0–0.1)
Basophils Relative: 1 %
Eosinophils Absolute: 0.3 10*3/uL (ref 0.0–0.5)
Eosinophils Relative: 3 %
HCT: 40.5 % (ref 36.0–46.0)
Hemoglobin: 12.6 g/dL (ref 12.0–15.0)
Immature Granulocytes: 3 %
Lymphocytes Relative: 16 %
Lymphs Abs: 1.5 10*3/uL (ref 0.7–4.0)
MCH: 31.1 pg (ref 26.0–34.0)
MCHC: 31.1 g/dL (ref 30.0–36.0)
MCV: 100 fL (ref 80.0–100.0)
Monocytes Absolute: 0.4 10*3/uL (ref 0.1–1.0)
Monocytes Relative: 5 %
Neutro Abs: 7.1 10*3/uL (ref 1.7–7.7)
Neutrophils Relative %: 72 %
Platelets: 309 10*3/uL (ref 150–400)
RBC: 4.05 MIL/uL (ref 3.87–5.11)
RDW: 12.9 % (ref 11.5–15.5)
WBC: 9.7 10*3/uL (ref 4.0–10.5)
nRBC: 0 % (ref 0.0–0.2)

## 2018-09-27 LAB — FIBRINOGEN: Fibrinogen: 415 mg/dL (ref 210–475)

## 2018-09-27 LAB — D-DIMER, QUANTITATIVE (NOT AT ARMC): D-Dimer, Quant: 2.51 ug/mL-FEU — ABNORMAL HIGH (ref 0.00–0.50)

## 2018-09-27 LAB — BASIC METABOLIC PANEL
Anion gap: 9 (ref 5–15)
BUN: 22 mg/dL (ref 8–23)
CO2: 29 mmol/L (ref 22–32)
Calcium: 8.8 mg/dL — ABNORMAL LOW (ref 8.9–10.3)
Chloride: 101 mmol/L (ref 98–111)
Creatinine, Ser: 0.85 mg/dL (ref 0.44–1.00)
GFR calc Af Amer: >60 mL/min (ref >60)
GFR calc non Af Amer: >60 mL/min (ref >60)
Glucose, Bld: 101 mg/dL — ABNORMAL HIGH (ref 70–99)
Potassium: 3.9 mmol/L (ref 3.5–5.1)
Sodium: 139 mmol/L (ref 135–145)

## 2018-09-27 LAB — FERRITIN: Ferritin: 423 ng/mL — ABNORMAL HIGH (ref 11–307)

## 2018-09-27 LAB — C-REACTIVE PROTEIN: CRP: <0.8 mg/dL (ref <1.0)

## 2018-09-27 MED ORDER — LEVOTHYROXINE SODIUM 75 MCG PO TABS
75.0000 ug | ORAL_TABLET | Freq: Every day | ORAL | Status: DC
  Administered 2018-09-28 – 2018-10-01 (×4): 75 ug via ORAL
  Filled 2018-09-27 (×5): qty 1

## 2018-09-27 MED ORDER — PANTOPRAZOLE SODIUM 40 MG PO TBEC
40.0000 mg | DELAYED_RELEASE_TABLET | Freq: Every day | ORAL | Status: DC
  Administered 2018-09-27 – 2018-09-30 (×4): 40 mg via ORAL
  Filled 2018-09-27 (×4): qty 1

## 2018-09-27 NOTE — Progress Notes (Signed)
PROGRESS NOTE    Becky HewLinda Shook  YNW:[id number] DOB: 1956-01-08 DOA: 09/17/2018 PCP: Sheliah Hatchabori, Katherine E, MD   Brief Narrative:  63 year old female PMHx rheumatoid arthritis on chronic immunosuppressive therapy (prednisone+ SulfaSalazine), Sjogren, Hashimoto thyroiditis, fibromyalgia   Admitted to the hospital on 09/17/2018 with fever, cough, nausea diarrhea and fatigue that started about 8 days prior to admission.  Her [facility] and diagnosed with [facility].  In our ED she was positive for coronavirus, chest x-ray was concerning for left lung infiltrate, she had a fever of 102 and developed hypoxic respiratory failure requiring oxygen supplementation.   Approximate date Covid symptoms started: 09/09/2018.     Patient has already finished a course of antibiotics with ceftriaxone and azithromycin on 4/17.     Subjective: She continues to have cough, feels that shortness of breath improving. Has oxygen desaturations on exertion  Assessment & Plan:   Principal Problem:   Fever Active Problems:   Hypothyroidism   Sjogren's syndrome (HCC)   COVID-19 virus infection   Acute on chronic respiratory failure with hypoxia (HCC)   Hypokalemia   Chronic diastolic CHF (congestive heart failure) (HCC)   Rheumatoid arthritis (HCC)  Acute respiratory failure with hypoxia/COVID-19 pneumonia - Patient has Plaquenil allergy (prolonged QT interval), therefore did not receive empirically - Received ceftriaxone + azithromycin for presumed bacterial involvement -Received Actemra on 4/18 and repeat dose on 4/20 -Continue to place patient in prone position for 16 hours a day.  Minimum of 2 to 3 hours at a time -She was receiving solumedrol, but since she is clinically improving, she has been transitioned to oral prednisone taper -currently on 15L oxygen, but appears comfortable. Will continue to wean down oxygen as tolerated -pulmonary hygiene encouraged, she will perform sit  to stands and work with PT     COVID-19 markers Recent Labs  Lab 09/21/18 0510  LDH 556*   Recent Labs  Lab 09/23/18 0500 09/24/18 0715 09/25/18 0125 09/26/18 0135 09/27/18 0205  FERRITIN 551* 549* 630* 570* 423*   Recent Labs  Lab 09/23/18 0500 09/24/18 0715 09/25/18 0125 09/26/18 0135 09/27/18 0205  CRP 2.2* 1.2* 0.9 <0.8 <0.8   Recent Labs  Lab 09/23/18 0500 09/24/18 0715 09/25/18 0125 09/26/18 0135 09/27/18 0205  DDIMER 2.51* 2.53* 2.30* 2.47* 2.51*   Recent Labs  Lab 09/21/18 0510 09/24/18 0715 09/25/18 0125 09/26/18 0135 09/27/18 0205  FIBRINOGEN 785* 596* 598* 464 415   -QTc 449 on 4/19 -IL-6; 68.2    Chronic diastolic CHF -Last echocardiogram 08/30/2014 grade 1 diastolic dysfunction normal EF -Strict in and out -Daily weight -She was receiving lasix 40mg  IV daily, but since creatinine started to trend up, further diuretics were held -appears to be euvolemic now  Rheumatoid arthritis/Sjogren's Syndrome  -SulfaSalazine thousand milligrams twice daily - she was receiving solumedrol. Since she is improving, will transition to prednisone taper -will taper prednisone back to her chronic dose of 5mg  daily  Hypothyroidism  - Synthroid 75 mcg    Hypokalemia -replaced, magnesium is not low   Elevated LFTs -Continue to monitor    DVT prophylaxis: lovenox Code Status: Full Family Communication: discussed with daughter over the phone 4/26 Disposition Plan: TBD   Consultants:  CCM  Procedures/Significant Events:     I have personally reviewed and interpreted all radiology studies and my findings are as above.   Cultures 4/16 blood right antecubital negative 4/16 blood LEFT antecubital negative   Antimicrobials: Anti-infectives (From admission, onward)   Start  Stop   09/20/18 1000  hydroxychloroquine (PLAQUENIL) tablet 200 mg  Status:  Discontinued     09/19/18 1305   09/19/18 1315  hydroxychloroquine (PLAQUENIL) tablet  400 mg  Status:  Discontinued     09/19/18 1305   09/18/18 1000  azithromycin (ZITHROMAX) 500 mg in sodium chloride 0.9 % 250 mL IVPB  Status:  Discontinued     09/19/18 1011   09/18/18 0930  cefTRIAXone (ROCEPHIN) 1 g in sodium chloride 0.9 % 100 mL IVPB  Status:  Discontinued     09/19/18 1011   09/17/18 1700  cefTRIAXone (ROCEPHIN) 1 g in sodium chloride 0.9 % 100 mL IVPB  Status:  Discontinued     09/17/18 1744   09/17/18 1700  azithromycin (ZITHROMAX) 500 mg in sodium chloride 0.9 % 250 mL IVPB  Status:  Discontinued     09/17/18 1744       Devices    LINES / TUBES:     Continuous Infusions: . sodium chloride 1,000 mL (09/22/18 1454)  . sodium chloride 10 mL/hr at 09/27/18 1000  . ondansetron (ZOFRAN) IV       Objective: Vitals:   09/27/18 0840 09/27/18 0900 09/27/18 1000 09/27/18 1143  BP: 113/77  111/75 118/89  Pulse: 86 93 (!) 102 94  Resp: (!) 21 (!) 25 (!) 22 (!) 27  Temp:      TempSrc:      SpO2: 97% 90% 90% 96%  Weight:      Height:        Intake/Output Summary (Last 24 hours) at 09/27/2018 1149 Last data filed at 09/27/2018 1000 Gross per 24 hour  Intake 1107.2 ml  Output 1500 ml  Net -392.8 ml   Filed Weights   09/24/18 0500 09/26/18 0500 09/27/18 0600  Weight: 61 kg 61.3 kg 60.8 kg    Examination:  General exam: Alert, awake, oriented x 3 Respiratory system: occasional rhonchi, no wheezing. Respiratory effort normal. Cardiovascular system:RRR. No murmurs, rubs, gallops. Gastrointestinal system: Abdomen is nondistended, soft and nontender. No organomegaly or masses felt. Normal bowel sounds heard. Central nervous system: Alert and oriented. No focal neurological deficits. Extremities: No C/C/E, +pedal pulses Skin: No rashes, lesions or ulcers Psychiatry: Judgement and insight appear normal. Mood & affect appropriate.       Data Reviewed: Care during the described time interval was provided by me .  I have reviewed this patient's available  data, including medical history, events of note, physical examination, and all test results as part of my evaluation.   CBC: Recent Labs  Lab 09/23/18 0500  09/24/18 0715 09/24/18 0953 09/25/18 0125 09/26/18 0135 09/27/18 0205  WBC 11.5*  --  19.8*  --  15.5* 10.2 9.7  NEUTROABS 10.3*  --  17.6*  --  13.5* 8.0* 7.1  HGB 14.2   < > 14.3 14.3 15.2* 13.9 12.6  HCT 45.9   < > 44.4 42.0 48.2* 44.3 40.5  MCV 100.7*  --  100.5*  --  101.7* 100.7* 100.0  PLT 377  --  426*  --  468* 372 309   < > = values in this interval not displayed.   Basic Metabolic Panel: Recent Labs  Lab 09/22/18 0450  09/23/18 0500 09/23/18 1034 09/24/18 0715 09/24/18 0953 09/25/18 0125 09/26/18 0135 09/26/18 1218 09/27/18 0205  NA 138   < > 141 139  --  140 146* 138  --  139  K 4.1   < > 4.0 3.7  --  3.9 3.4* 2.9*  --  3.9  CL 99  --  99  --   --   --  101 97*  --  101  CO2 28  --  26  --   --   --  29 29  --  29  GLUCOSE 108*  --  134*  --   --   --  121* 118*  --  101*  BUN 31*  --  49*  --   --   --  69* 40*  --  22  CREATININE 1.09*  --  1.26*  --  1.30*  --  1.17* 1.03*  --  0.85  CALCIUM 9.3  --  9.3  --   --   --  9.6 9.1  --  8.8*  MG  --   --   --   --   --   --   --   --  3.1*  --    < > = values in this interval not displayed.   GFR: Estimated Creatinine Clearance: 55.3 mL/min (by C-G formula based on SCr of 0.85 mg/dL). Liver Function Tests: Recent Labs  Lab 09/21/18 0510 09/22/18 0450 09/23/18 0500  AST 46* 45* 39  ALT 21 19 17   ALKPHOS 142* 136* 142*  BILITOT 0.4 0.4 1.1  PROT 6.7 6.4* 4.5*  ALBUMIN 3.6 3.6 4.1   No results for input(s): LIPASE, AMYLASE in the last 168 hours. No results for input(s): AMMONIA in the last 168 hours. Coagulation Profile: No results for input(s): INR, PROTIME in the last 168 hours. Cardiac Enzymes: Recent Labs  Lab 09/21/18 0510  CKTOTAL 41   BNP (last 3 results) No results for input(s): PROBNP in the last 8760 hours. HbA1C: No results  for input(s): HGBA1C in the last 72 hours. CBG: Recent Labs  Lab 09/24/18 1205 09/25/18 0742  GLUCAP 162* 133*   Lipid Profile: No results for input(s): CHOL, HDL, LDLCALC, TRIG, CHOLHDL, LDLDIRECT in the last 72 hours. Thyroid Function Tests: No results for input(s): TSH, T4TOTAL, FREET4, T3FREE, THYROIDAB in the last 72 hours. Anemia Panel: Recent Labs    09/26/18 0135 09/27/18 0205  FERRITIN 570* 423*   Urine analysis:    Component Value Date/Time   COLORURINE YELLOW 09/05/2009 2343   APPEARANCEUR CLOUDY (A) 09/05/2009 2343   LABSPEC 1.027 09/05/2009 2343   PHURINE 6.0 09/05/2009 2343   GLUCOSEU 100 (A) 09/05/2009 2343   HGBUR MODERATE (A) 09/05/2009 2343   BILIRUBINUR Negative 01/27/2017 1200   KETONESUR NEGATIVE 09/05/2009 2343   PROTEINUR + 01/27/2017 1200   PROTEINUR NEGATIVE 09/05/2009 2343   UROBILINOGEN 0.2 01/27/2017 1200   UROBILINOGEN 0.2 09/05/2009 2343   NITRITE + 01/27/2017 1200   NITRITE NEGATIVE 09/05/2009 2343   LEUKOCYTESUR Small (1+) (A) 01/27/2017 1200   Sepsis Labs: @LABRCNTIP (procalcitonin:4,lacticidven:4)  ) Recent Results (from the past 240 hour(s))  SARS Coronavirus 2 [facility](Hospital order, Performed in [facility] hospital lab)     Status: Abnormal   Collection Time: 09/17/18  3:50 PM  Result Value Ref Range Status   SARS Coronavirus 2 POSITIVE (A) NEGATIVE Final    Comment: RESULT CALLED TO, READ BACK BY AND VERIFIED WITH: S.BINGHAM AT 1717 ON 09/17/18 BY N.THOMPSON (NOTE) If result is NEGATIVE SARS-CoV-2 target nucleic acids are NOT DETECTED. The SARS-CoV-2 RNA is generally detectable in upper and lower  respiratory specimens during the acute phase of infection. The lowest  concentration of SARS-CoV-2 viral copies this assay can detect  is 250  copies / mL. A negative result does not preclude SARS-CoV-2 infection  and should not be used as the sole basis for treatment or other  patient management decisions.  A negative result may occur  with  improper specimen collection / handling, submission of specimen other  than nasopharyngeal swab, presence of viral mutation(s) within the  areas targeted by this assay, and inadequate number of viral copies  (<250 copies / mL). A negative result must be combined with clinical  observations, patient history, and epidemiological information. If result is POSITIVE SARS-CoV-2 target nucleic acids are DETEC TED. The SARS-CoV-2 RNA is generally detectable in upper and lower  respiratory specimens during the acute phase of infection.  Positive  results are indicative of active infection with SARS-CoV-2.  Clinical  correlation with patient history and other diagnostic information is  necessary to determine patient infection status.  Positive results do  not rule out bacterial infection or co-infection with other viruses. If result is PRESUMPTIVE POSTIVE SARS-CoV-2 nucleic acids MAY BE PRESENT.   A presumptive positive result was obtained on the submitted specimen  and confirmed on repeat testing.  While 2019 novel coronavirus  (SARS-CoV-2) nucleic acids may be present in the submitted sample  additional confirmatory testing may be necessary for epidemiological  and / or clinical management purposes  to differentiate between  SARS-CoV-2 and other Sarbecovirus currently known to infect humans.  If clinically indicated additional testing with an alternate test  methodology (LAB7 453) is advised. The SARS-CoV-2 RNA is generally  detectable in upper and lower respiratory specimens during the acute  phase of infection. The expected result is Negative. Fact Sheet for Patients:  BoilerBrush.com.cy Fact Sheet for Healthcare Providers: https://pope.com/ This test is not yet approved or cleared by the [place] FDA and has been authorized for detection and/or diagnosis of SARS-CoV-2 by FDA under an Emergency Use Authorization (EUA).  This EUA  will remain in effect (meaning this test can be used) for the duration of the COVID-19 declaration under Section 564(b)(1) of the Act, 21 U.S.C. section 360bbb-3(b)(1), unless the authorization is terminated or revoked sooner. Performed at [facility], [address] [place], [place] [address]   Blood Culture (routine x 2)     Status: None   Collection Time: 09/17/18  3:50 PM  Result Value Ref Range Status   Specimen Description   Final    BLOOD RIGHT ANTECUBITAL Performed at [facility], [address] [place] [address] [place], [place] [address]    Special Requests   Final    BOTTLES DRAWN AEROBIC AND ANAEROBIC Blood Culture adequate volume Performed at [facility], [address] [place], [place] [address]    Culture   Final    NO GROWTH 5 DAYS Performed at [facility], [address], [place], [place] [address]    Report Status 09/22/2018 FINAL  Final  Blood Culture (routine x 2)     Status: None   Collection Time: 09/17/18  4:33 PM  Result Value Ref Range Status   Specimen Description   Final    BLOOD LEFT ANTECUBITAL Performed at [facility], [address] [place], [place] [address]    Special Requests   Final    BOTTLES DRAWN AEROBIC AND ANAEROBIC Blood Culture adequate volume Performed at [facility], [address] [place], [place] [address]    Culture   Final    NO GROWTH 5 DAYS Performed  at [facility], [address] [place], [place] [address]    Report Status 09/22/2018 FINAL  Final         Radiology Studies: Dg Chest Port 1 View  Result Date: 09/26/2018 CLINICAL DATA:  Followup pneumonia/ARDS. EXAM: PORTABLE CHEST 1 VIEW COMPARISON:  09/25/2018 and earlier. FINDINGS: Cardiac silhouette normal in size, unchanged. Interstitial and minimal ground-glass airspace opacity in the mid and lower lung zones, LEFT greater than RIGHT, unchanged since yesterday but  minimally improved since the chest x-ray on 09/21/2018. No new pulmonary parenchymal abnormalities. Calcifications involving the pseudocapsules of the BILATERAL breast implants again noted. IMPRESSION: 1. Stable mild changes of pneumonia/ARDS since yesterday, improved since 09/21/2018. 2. No new abnormalities. Electronically Signed   By: Hulan Saas M.D.   On: 09/26/2018 15:29        Scheduled Meds: . atorvastatin  20 mg Oral Daily  . DULoxetine  60 mg Oral Daily  . enoxaparin (LOVENOX) injection  40 mg Subcutaneous Q24H  . guaiFENesin  15 mL Oral Q6H  . levothyroxine  37.5 mcg Intravenous Daily  . lidocaine  1 patch Transdermal Q24H  . mouth rinse  15 mL Mouth Rinse BID  . pantoprazole (PROTONIX) IV  40 mg Intravenous QHS  . potassium chloride  40 mEq Oral BID  . predniSONE  40 mg Oral Q breakfast  . sulfaSALAzine  1,000 mg Oral BID  . vitamin C  500 mg Oral Daily  . zinc sulfate  220 mg Oral Daily   Continuous Infusions: . sodium chloride 1,000 mL (09/22/18 1454)  . sodium chloride 10 mL/hr at 09/27/18 1000  . ondansetron (ZOFRAN) IV       LOS: 10 days   The patient is critically ill with multiple organ systems failure and requires high complexity decision making for assessment and support, frequent evaluation and titration of therapies, application of advanced monitoring technologies and extensive interpretation of multiple databases. Critical Care Time devoted to patient care services described in this note   Time spent: 40 minutes     Erick Blinks, MD [facility]   If 7PM-7AM, please contact night-coverage [email or web address]  09/27/2018, 11:49 AM

## 2018-09-27 NOTE — Progress Notes (Signed)
Pt had a good night. She rested several hours. She does desat quickly if she takes oxygen off.

## 2018-09-27 NOTE — Progress Notes (Signed)
[name]'s daughter, [name], called for updates. Stated that several friends had received texts from [place] and she had as well. I confirmed that [place] was sending texts. Also informed [name] that she had been watching Netflix. She was extremely surprised and pleased to hear that she was doing well. She asked that we please call her anytime during the day or night with any updates. She again thanks all the staff for the care [name] is receiving.

## 2018-09-27 NOTE — Progress Notes (Signed)
PHARMACIST - PHYSICIAN COMMUNICATION  DR:   Kerry Hough CONCERNING: IV to Oral Route Change Policy  RECOMMENDATION: This patient is receiving Synthroid and Protonix by the intravenous route.  Based on criteria approved by the Pharmacy and Therapeutics Committee, the intravenous medication(s) is/are being converted to the equivalent oral dose form(s).  DESCRIPTION: These criteria include:  The patient is eating (either orally or via tube) and/or has been taking other orally administered medications for a least 24 hours  The patient has no evidence of active gastrointestinal bleeding or impaired GI absorption (gastrectomy, short bowel, patient on TNA or NPO).  If you have questions about this conversion, please contact the Pharmacy Department  Lynann Beaver PharmD, BCPS 09/27/2018 1:41 PM

## 2018-09-27 NOTE — Progress Notes (Signed)
NAME:  Becky Boyd, MRN:  [id number], DOB:  April 08, 1956, LOS: 10 ADMISSION DATE:  09/17/2018, CONSULTATION DATE:  09/25/2018 REFERRING MD:  Joseph Art, CHIEF COMPLAINT:  Dyspnea   Brief History   63 year old woman with rheumatoid arthritis on chronic prednisone admitted 4/16 with CO VID pneumonia.  Her husband was hospitalized at [facility] and tested positive for coronavirus   Past Medical History  Sjogren's disease RA SLE Hypthyroidism Fibromyalgia Dysphagia Migraines   Significant Hospital Events   4/16 admission 4/18 Actemra 4/20 Actemra  Consults:  PCCM  Procedures:    Significant Diagnostic Tests:  2016 CT chest images reviewed: normal pulmonary parenchyma 2016 Esophogram: mild reflux, small hiatal hernia,   Micro Data:  4/16 blood culture>> ng 4/16 nasopharyngeal swab SARS COV-2 >> POS  Antimicrobials:  Ceftriaxone 4/16 >>4/17 Azithromycin 4/16 >>4/17  Interim history/subjective:  Oxygenation about the same Has been out of bed, sitting up in chair more Coughing up more mucus now In general feels better Wants to leave ICU Still has sinus congestion  Objective   Blood pressure 112/60, pulse 86, temperature 98.8 F (37.1 C), temperature source Axillary, resp. rate (!) 23, height 5\' 3"  (1.6 m), weight 60.8 kg, SpO2 94 %.    FiO2 (%):  [96 %] 96 %   Intake/Output Summary (Last 24 hours) at 09/27/2018 0815 Last data filed at 09/27/2018 0400 Gross per 24 hour  Intake 1047.6 ml  Output 1070 ml  Net -22.4 ml   Filed Weights   09/24/18 0500 09/26/18 0500 09/27/18 0600  Weight: 61 kg 61.3 kg 60.8 kg    Examination:  General:  Resting comfortably in chair HENT: NCAT OP clear PULM: few crackles bases B, normal effort CV: RRR, no mgr GI: BS+, soft, nontender MSK: normal bulk and tone Neuro: awake, alert, no distress, MAEW   4/25 CXR images personally reviewed: bibasilar infiltrates, no effusion  Resolved Hospital Problem list     Assessment &  Plan:  Acute respiratory failure with hypoxemia COVID 19: Oxygenation stable, work of breathing the same (not currently short of breath) Has received Solu-Medrol, prednisone, Actemra x2  Plan: OK for transfer to progressive care Out of bed as tolerated Tolerate O2 saturation in 80s with movement Continue to monitor closely for increased work of breathing as this would be more of an indicator of respiratory failure than her hypoxemia at this point Flutter valve to continue Incentive spirometry should continue Scheduled guaifenesin to continue Continue prednisone for now   Sjogrens, RA, SLE Continue prednisone   Sinus congestion: Continue saline sprays  Acute encephalopathy due to narcotics RESOLVED Minimize sedation  Activity: sit to stands x10 today  Best practice:  Diet: diet as tolerated Pain/Anxiety/Delirium protocol (if indicated): no VAP protocol (if indicated): n/a DVT prophylaxis: yes GI prophylaxis: Pantoprazole for stress ulcer prophylaxis Glucose control: monitor Mobility: out of bed Code Status: full Family Communication: will contact Disposition: remain in ICU  Labs   CBC: Recent Labs  Lab 09/23/18 0500  09/24/18 0715 09/24/18 0953 09/25/18 0125 09/26/18 0135 09/27/18 0205  WBC 11.5*  --  19.8*  --  15.5* 10.2 9.7  NEUTROABS 10.3*  --  17.6*  --  13.5* 8.0* 7.1  HGB 14.2   < > 14.3 14.3 15.2* 13.9 12.6  HCT 45.9   < > 44.4 42.0 48.2* 44.3 40.5  MCV 100.7*  --  100.5*  --  101.7* 100.7* 100.0  PLT 377  --  426*  --  468* 372 309   < > =  values in this interval not displayed.    Basic Metabolic Panel: Recent Labs  Lab 09/22/18 0450  09/23/18 0500 09/23/18 1034 09/24/18 0715 09/24/18 0953 09/25/18 0125 09/26/18 0135 09/26/18 1218 09/27/18 0205  NA 138   < > 141 139  --  140 146* 138  --  139  K 4.1   < > 4.0 3.7  --  3.9 3.4* 2.9*  --  3.9  CL 99  --  99  --   --   --  101 97*  --  101  CO2 28  --  26  --   --   --  29 29  --  29   GLUCOSE 108*  --  134*  --   --   --  121* 118*  --  101*  BUN 31*  --  49*  --   --   --  69* 40*  --  22  CREATININE 1.09*  --  1.26*  --  1.30*  --  1.17* 1.03*  --  0.85  CALCIUM 9.3  --  9.3  --   --   --  9.6 9.1  --  8.8*  MG  --   --   --   --   --   --   --   --  3.1*  --    < > = values in this interval not displayed.   GFR: Estimated Creatinine Clearance: 55.3 mL/min (by C-G formula based on SCr of 0.85 mg/dL). Recent Labs  Lab 09/24/18 0715 09/25/18 0125 09/26/18 0135 09/27/18 0205  WBC 19.8* 15.5* 10.2 9.7    Liver Function Tests: Recent Labs  Lab 09/21/18 0510 09/22/18 0450 09/23/18 0500  AST 46* 45* 39  ALT 21 19 17   ALKPHOS 142* 136* 142*  BILITOT 0.4 0.4 1.1  PROT 6.7 6.4* 4.5*  ALBUMIN 3.6 3.6 4.1   No results for input(s): LIPASE, AMYLASE in the last 168 hours. No results for input(s): AMMONIA in the last 168 hours.  ABG    Component Value Date/Time   PHART 7.466 (H) 09/24/2018 0953   PCO2ART 45.7 09/24/2018 0953   PO2ART 56.0 (L) 09/24/2018 0953   HCO3 32.9 (H) 09/24/2018 0953   TCO2 34 (H) 09/24/2018 0953   O2SAT 90.0 09/24/2018 0953     Coagulation Profile: No results for input(s): INR, PROTIME in the last 168 hours.  Cardiac Enzymes: Recent Labs  Lab 09/21/18 0510  CKTOTAL 41    HbA1C: Hgb A1c MFr Bld  Date/Time Value Ref Range Status  09/09/2009 05:47 AM  4.6 - 6.1 % Final   5.9 (NOTE) The ADA recommends the following therapeutic goal for glycemic control related to Hgb A1c measurement: Goal of therapy: <6.5 Hgb A1c  Reference: American Diabetes Association: Clinical Practice Recommendations 2010, Diabetes Care, 2010, 33: (Suppl  1).    CBG: Recent Labs  Lab 09/24/18 1205 09/25/18 0742  GLUCAP 162* 133*    Review of Systems:   Gen: Denies fever, chills, weight change, + fatigue, night sweats HEENT: Denies blurred vision, double vision, hearing loss, tinnitus, sinus congestion, rhinorrhea, sore throat, neck stiffness,  dysphagia,  PULM: per HPI   Past Medical History  She,  has a past medical history of Aneurysm (HCC), Anorexia, Dysphagia, Fibromyalgia, Fibromyalgia, Hashimoto thyroiditis, Hypothyroidism, Kidney stones, Lupus (HCC), Migraines, Rheumatoid arthritis (HCC), and Sjogren's disease (HCC).   Surgical History    Past Surgical History:  Procedure Laterality Date  . ABDOMINAL HYSTERECTOMY    .  CERVICAL FUSION  2004   C4-C5 with Instrumentation  . CERVICAL FUSION  1990   C3-C4  . cervical rods    . CHOLECYSTECTOMY    . kidney stone    . l5-s1 surgery    . LUMBAR MICRODISCECTOMY  2004   Bilateral L5-S1     Social History   reports that she has never smoked. She has never used smokeless tobacco. She reports that she does not drink alcohol or use drugs.   Family History   Her family history includes Aneurysm in her paternal aunt; Breast cancer in her paternal aunt; COPD in her brother and mother; Diabetes in her sister and another family member; Heart disease in her father.   Allergies Allergies  Allergen Reactions  . Plaquenil [Hydroxychloroquine Sulfate] Other (See Comments)    Prolonged QT interval, skin feels like bee stings     Home Medications  Prior to Admission medications   Medication Sig Start Date End Date Taking? Authorizing Provider  acetaminophen (TYLENOL) 325 MG tablet Take 650 mg by mouth every 6 (six) hours as needed for fever or headache.   Yes [provider]  atorvastatin (LIPITOR) 20 MG tablet TAKE ONE TABLET BY MOUTH ONE TIME DAILY  Patient taking differently: Take 20 mg by mouth daily.  09/02/18  Yes Sheliah Hatchabori, Katherine E, MD  baclofen (LIORESAL) 10 MG tablet take 1 tablet by mouth 3 times daily 03/13/18  Yes Sheliah Hatchabori, Katherine E, MD  DULoxetine (CYMBALTA) 60 MG capsule Take 60 mg by mouth daily.  11/01/14  Yes [provider]  levothyroxine (SYNTHROID, LEVOTHROID) 75 MCG tablet Take 75 mcg by mouth daily.     Yes [provider]   meloxicam (MOBIC) 15 MG tablet Take 15 mg by mouth daily as needed for pain (takes daily).    Yes [provider]  predniSONE (DELTASONE) 5 MG tablet Take 5 mg by mouth daily with breakfast.   Yes [provider]  sulfaSALAzine (AZULFIDINE) 500 MG tablet 1,000 mg 2 (two) times daily.  01/06/17  Yes [provider]  HYDROcodone-acetaminophen (NORCO/VICODIN) 5-325 MG per tablet Take 1-2 tablets by mouth every 6 (six) hours as needed. Patient not taking: Reported on 09/17/2018 10/11/13   Blake DivineWofford, John, MD     Critical care time: 30 minutes     Heber CarolinaBrent McQuaid, MD [facility] Pager: [phone] Cell: [phone] If no response, call [phone]

## 2018-09-28 LAB — BASIC METABOLIC PANEL
Anion gap: 6 (ref 5–15)
BUN: 16 mg/dL (ref 8–23)
CO2: 29 mmol/L (ref 22–32)
Calcium: 8.9 mg/dL (ref 8.9–10.3)
Chloride: 103 mmol/L (ref 98–111)
Creatinine, Ser: 0.76 mg/dL (ref 0.44–1.00)
GFR calc Af Amer: >60 mL/min (ref >60)
GFR calc non Af Amer: >60 mL/min (ref >60)
Glucose, Bld: 93 mg/dL (ref 70–99)
Potassium: 4.4 mmol/L (ref 3.5–5.1)
Sodium: 138 mmol/L (ref 135–145)

## 2018-09-28 LAB — CBC WITH DIFFERENTIAL/PLATELET
Abs Immature Granulocytes: 0.31 10*3/uL — ABNORMAL HIGH (ref 0.00–0.07)
Basophils Absolute: 0.1 10*3/uL (ref 0.0–0.1)
Basophils Relative: 1 %
Eosinophils Absolute: 0.2 10*3/uL (ref 0.0–0.5)
Eosinophils Relative: 2 %
HCT: 39.3 % (ref 36.0–46.0)
Hemoglobin: 12.4 g/dL (ref 12.0–15.0)
Immature Granulocytes: 3 %
Lymphocytes Relative: 14 %
Lymphs Abs: 1.6 10*3/uL (ref 0.7–4.0)
MCH: 32.1 pg (ref 26.0–34.0)
MCHC: 31.6 g/dL (ref 30.0–36.0)
MCV: 101.8 fL — ABNORMAL HIGH (ref 80.0–100.0)
Monocytes Absolute: 0.5 10*3/uL (ref 0.1–1.0)
Monocytes Relative: 4 %
Neutro Abs: 9.2 10*3/uL — ABNORMAL HIGH (ref 1.7–7.7)
Neutrophils Relative %: 76 %
Platelets: 229 10*3/uL (ref 150–400)
RBC: 3.86 MIL/uL — ABNORMAL LOW (ref 3.87–5.11)
RDW: 12.9 % (ref 11.5–15.5)
WBC: 12 10*3/uL — ABNORMAL HIGH (ref 4.0–10.5)
nRBC: 0 % (ref 0.0–0.2)

## 2018-09-28 MED ORDER — ONDANSETRON HCL 4 MG/2ML IJ SOLN
4.0000 mg | Freq: Four times a day (QID) | INTRAMUSCULAR | Status: DC | PRN
  Administered 2018-09-29 – 2018-09-30 (×3): 4 mg via INTRAVENOUS
  Filled 2018-09-28 (×3): qty 2

## 2018-09-28 MED ORDER — BOOST / RESOURCE BREEZE PO LIQD CUSTOM
1.0000 | Freq: Two times a day (BID) | ORAL | Status: DC
  Filled 2018-09-28 (×8): qty 1

## 2018-09-28 MED ORDER — PRO-STAT SUGAR FREE PO LIQD: 30.0000 mL | Freq: Three times a day (TID) | ORAL | Status: DC

## 2018-09-28 MED ORDER — ADULT MULTIVITAMIN W/MINERALS CH
1.0000 | ORAL_TABLET | Freq: Every day | ORAL | Status: DC
  Administered 2018-09-28 – 2018-10-01 (×4): 1 via ORAL
  Filled 2018-09-28 (×4): qty 1

## 2018-09-28 MED ORDER — PREDNISONE 10 MG PO TABS
20.0000 mg | ORAL_TABLET | Freq: Every day | ORAL | Status: DC
  Administered 2018-09-29 – 2018-10-01 (×3): 20 mg via ORAL
  Filled 2018-09-28 (×4): qty 2

## 2018-09-28 MED ORDER — PRO-STAT SUGAR FREE PO LIQD
30.0000 mL | Freq: Two times a day (BID) | ORAL | Status: DC
  Administered 2018-09-29 – 2018-10-01 (×4): 30 mL via ORAL
  Filled 2018-09-28 (×6): qty 30

## 2018-09-28 MED ORDER — ENSURE ENLIVE PO LIQD: 237.0000 mL | Freq: Two times a day (BID) | ORAL | Status: DC

## 2018-09-28 NOTE — Evaluation (Signed)
Physical Therapy Evaluation Patient Details Name: Becky Boyd MRN: [id number] DOB: [birth year] Today's Date: 09/28/2018   History of Present Illness  63 year old female PMHx rheumatoid arthritis on chronic immunosuppressive therapy, Sjogren, Hashimoto thyroiditis, fibromyalgia;  Admitted to the hospital on 09/17/2018 with fever, cough, nausea diarrhea and fatigue that started about 8 days prior to admission.  Her husband was hospitalized at [facility] and diagnosed with COVID-19.  In our ED she was positive for coronavirus, chest x-ray was concerning for left lung infiltrate, she had a fever of 102 and developed hypoxic respiratory failure requiring oxygen supplementation.  Approximate date Covid symptoms started: 09/09/2018.   Clinical Impression  Pt admitted with above diagnosis. Pt currently with functional limitations due to the deficits listed below (see PT Problem List).  Pt cooperative, motivated to work with PT however with extremely limited activity tolerance; will continue to follow, pt should be able to d/c home with HHPT; (chart states that pt may go home with her dtr--pt states this is not correct, she will go to her home and her son is assisting)   Pt will benefit from skilled PT to increase their independence and safety with mobility to allow discharge to the venue listed below.       Follow Up Recommendations Home health PT;Supervision - Intermittent    Equipment Recommendations  Other (comment)(TBD)    Recommendations for Other Services       Precautions / Restrictions Precautions Precautions: Fall Precaution Comments: monitor VSS Restrictions Weight Bearing Restrictions: No      Mobility  Bed Mobility Overal bed mobility: Needs Assistance Bed Mobility: Supine to Sit     Supine to sit: Supervision;HOB elevated     General bed mobility comments: incr time and effort  Transfers Overall transfer level: Needs assistance   Transfers: Sit to/from Stand Sit to  Stand: Min guard         General transfer comment: steadying assist to rise   Ambulation/Gait Ambulation/Gait assistance: Min guard;Min assist Gait Distance (Feet): 12 Feet Assistive device: 1 person hand held assist;None Gait Pattern/deviations: Decreased stride length;Step-through pattern     General Gait Details: unsteady gait however no overt LOB; SpO2= 79% at lowest after amb 10', seated rest and incr >85% on 4.5L within ~ 15sec  Stairs            Wheelchair Mobility    Modified Rankin (Stroke Patients Only)       Balance Overall balance assessment: Needs assistance   Sitting balance-Leahy Scale: Good       Standing balance-Leahy Scale: Fair                               Pertinent Vitals/Pain Pain Assessment: No/denies pain    Home Living Family/patient expects to be discharged to:: Private residence Living Arrangements: Spouse/significant other Available Help at Discharge: Family Type of Home: House Home Access: Stairs to enter   Secretary/administrator of Steps: 2 Home Layout: Multi-level;Bed/bath upstairs;1/2 bath on main level Home Equipment: None      Prior Function                 Hand Dominance        Extremity/Trunk Assessment   Upper Extremity Assessment Upper Extremity Assessment: Generalized weakness    Lower Extremity Assessment Lower Extremity Assessment: Generalized weakness       Communication   Communication: No difficulties  Cognition Arousal/Alertness: Awake/alert Behavior During Therapy: [facility]  for tasks assessed/performed Overall Cognitive Status: Within Functional Limits for tasks assessed                                        General Comments General comments (skin integrity, edema, etc.): Pt SpO2=90-79% on 4.5L [place] O2; HR 111--123--112, RR in 20s; NAD, multiple rests needed d/t fatigue level and for O2 recovery    Exercises General Exercises - Lower Extremity Ankle  Circles/Pumps: AROM;Both;15 reps   Assessment/Plan    PT Assessment Patient needs continued PT services  PT Problem List Decreased strength;Decreased mobility;Decreased activity tolerance;Decreased balance;Cardiopulmonary status limiting activity       PT Treatment Interventions DME instruction;Gait training;Functional mobility training;Therapeutic activities;Therapeutic exercise;Patient/family education    PT Goals (Current goals can be found in the Care Plan section)  Acute Rehab PT Goals Patient Stated Goal: to get better and go home PT Goal Formulation: With patient Time For Goal Achievement: 10/12/18 Potential to Achieve Goals: Good    Frequency Min 4X/week   Barriers to discharge        Co-evaluation               AM-PAC PT "6 Clicks" Mobility  Outcome Measure Help needed turning from your back to your side while in a flat bed without using bedrails?: A Little Help needed moving from lying on your back to sitting on the side of a flat bed without using bedrails?: A Little Help needed moving to and from a bed to a chair (including a wheelchair)?: A Little Help needed standing up from a chair using your arms (e.g., wheelchair or bedside chair)?: A Little Help needed to walk in hospital room?: A Little Help needed climbing 3-5 steps with a railing? : A Little 6 Click Score: 18    End of Session Equipment Utilized During Treatment: Oxygen Activity Tolerance: Patient tolerated treatment well Patient left: in chair;with call bell/phone within reach Nurse Communication: Mobility status PT Visit Diagnosis: Unsteadiness on feet (R26.81)    Time: 4401-0272 PT Time Calculation (min) (ACUTE ONLY): 34 min   Charges:   PT Evaluation $PT Eval Low Complexity: 1 Low PT Treatments $Gait Training: 8-22 mins        Drucilla Chalet, PT  Pager: [phone] Acute Rehab Dept [facility]): [phone]   09/28/2018   [facility] 09/28/2018, 2:22 PM

## 2018-09-28 NOTE — Progress Notes (Signed)
PT TREATMENT --PM SESSION  09/28/18 1600  PT Visit Information  Last PT Received On 09/28/18  Assistance Needed +1  History of Present Illness 63 year old female PMHx rheumatoid arthritis on chronic immunosuppressive therapy, Sjogren, Hashimoto thyroiditis, fibromyalgia;  Admitted to the hospital on 09/17/2018 with fever, cough, nausea diarrhea and fatigue that started about 8 days prior to admission.  Her husband was hospitalized at [facility] and diagnosed with COVID-19.  In our ED she was positive for coronavirus, chest x-ray was concerning for left lung infiltrate, she had a fever of 102 and developed hypoxic respiratory failure requiring oxygen supplementation.  Approximate date Covid symptoms started: 09/09/2018.   Subjective Data  Patient Stated Goal to get better and go home  Precautions  Precautions Fall  Precaution Comments monitor VS  Pain Assessment  Pain Assessment No/denies pain  Cognition  Arousal/Alertness Awake/alert  Behavior During Therapy [facility] for tasks assessed/performed  Overall Cognitive Status Within Functional Limits for tasks assessed  Bed Mobility  Overal bed mobility Needs Assistance  Bed Mobility Sit to Supine  Sit to supine Supervision  General bed mobility comments incr time and effort  Transfers  Overall transfer level Needs assistance  Transfers Sit to/from Stand  Sit to Stand Min guard  General transfer comment steadying assist to rise   Ambulation/Gait  Ambulation/Gait assistance Min guard  Gait Distance (Feet) 13 Feet  Assistive device 1 person hand held assist;None  Gait Pattern/deviations Decreased stride length;Step-through pattern  General Gait Details unsteady gait however no overt LOB; SpO2= 83% at lowest after amb 10', seated rest and incr >85% on 3L within ~ 15sec  General Comments  General comments (skin integrity, edema, etc.) SpO2= 91--835--90% on 3L; RR 23--36--24; seated recovery with emphasis on trunk extension  Exercises   Exercises Other exercises  Other Exercises  Other Exercises repeated sit to stands 5 reps x2 with seated rest in between sets  PT - End of Session  Equipment Utilized During Treatment Oxygen  Activity Tolerance Patient tolerated treatment well  Patient left in bed;with call bell/phone within reach  Nurse Communication Mobility status   PT - Assessment/Plan  PT Plan Current plan remains appropriate  PT Visit Diagnosis Unsteadiness on feet (R26.81)  PT Frequency (ACUTE ONLY) Min 4X/week  Follow Up Recommendations Home health PT;Supervision - Intermittent  PT equipment Other (comment) (TBA)  AM-PAC PT "6 Clicks" Mobility Outcome Measure (Version 2)  Help needed turning from your back to your side while in a flat bed without using bedrails? 3  Help needed moving from lying on your back to sitting on the side of a flat bed without using bedrails? 3  Help needed moving to and from a bed to a chair (including a wheelchair)? 3  Help needed standing up from a chair using your arms (e.g., wheelchair or bedside chair)? 3  Help needed to walk in hospital room? 3  Help needed climbing 3-5 steps with a railing?  2  6 Click Score 17  Consider Recommendation of Discharge To: Home with [facility]  PT Goal Progression  Progress towards PT goals Progressing toward goals  Acute Rehab PT Goals  PT Goal Formulation With patient  Time For Goal Achievement 10/12/18  Potential to Achieve Goals Good  PT Time Calculation  PT Start Time (ACUTE ONLY) 1635  PT Stop Time (ACUTE ONLY) 1655  PT Time Calculation (min) (ACUTE ONLY) 20 min  PT General Charges  $$ ACUTE PT VISIT 1 Visit  PT Treatments  $Gait Training 8-22  mins

## 2018-09-28 NOTE — Progress Notes (Signed)
PROGRESS NOTE                                                                                                                                                                                                             Patient Demographics:    Becky Boyd, is a 63 y.o. female, DOB - [birth year], ZOX:[id number]  Outpatient Primary MD for the patient is Tabori, Helane RimaKatherine E, MD    LOS - 11  Chief Complaint  Patient presents with  . Shortness of Breath  . Fever  . Cough       Brief Narrative  63 year old female PMHx rheumatoid arthritis on chronic immunosuppressive therapy (prednisone+ SulfaSalazine), Sjogren, Hashimoto thyroiditis, fibromyalgia   Admitted to the hospital on 09/17/2018 with fever, cough, nausea diarrhea and fatigue that started about 8 days prior to admission. [facility]. In our ED she was positive for coronavirus, chest x-ray was concerning for left lung infiltrate, she had a fever of 102 and developed hypoxic respiratory failure requiring oxygen supplementation.  Approximate date Covid symptoms started: 09/09/2018. Patient has already finished a course of antibiotics with ceftriaxone and azithromycin on 4/17.  She see if 2 doses of Actemra along with IV steroids and was monitored in ICU for several days, now clinically improved.  She was titrated down to nasal cannula oxygen and transferred to my service on 09/28/2018.    Subjective:    Becky HewLinda Blanchett today has, No headache, No chest pain, No abdominal pain - No Nausea, No new weakness tingling or numbness, No Cough - improved SOB.    Assessment  & Plan :      1. Acute Hypoxic Resp. Failure due to Acute Covid 19 Viral Illness during the ongoing 2020 Covid 19 Pandemic - required monitoring in ICU with high flow nasal cannula oxygen under the care of pulmonary critical care, also had some evidence of  bacterial pneumonia hence has completed her antibiotic course.  She also received 2 doses of Actemra on 4/18 and 4/20 respectively, now on oral steroids and clinically much better.  Currently on 4 to 5 L nasal cannula oxygen.  Added flutter valve, requested to sit up in chair and ambulate in the hallway or in the room if possible.  Nursing staff updated.  Clinically she feels better and looks  better.  Continue to advance activity and titrate down oxygen.  Continue to titrate down oral prednisone kindly note patient is on 5 mg of prednisone on a chronic basis.  Possibly discharge home in the next 2 to 3 days if continues to improve.  Note husband is positive for COVID-19 infection and currently recuperating at home with home health.     Component Value Date/Time   FERRITIN 423 (H) 09/27/2018 0205       Component Value Date/Time   CRP <0.8 09/27/2018 0205   CRP <0.8 09/26/2018 0135   CRP 0.9 09/25/2018 0125    Lab Results  Component Value Date   DDIMER 2.51 (H) 09/27/2018    No results for input(s): BNP in the last 8760 hours.  No results for input(s): PROBNP in the last 8760 hours.   Chronic diastolic CHF with mild acute decompensation in ICU. - Last echocardiogram 08/30/2014 grade 1 diastolic dysfunction normal EF of around 60% - 65%.  She required IV Lasix for a few days in ICU.  Currently compensated.   Rheumatoid arthritis/Sjogren's Syndrome  - SulfaSalazine thousand milligrams twice daily, also on prednisone taper.  Please note patient was on 5 mg of prednisone chronically.  Hypothyroidism  - Synthroid 75 mcg  Elevated LFTs -   no right upper quadrant pain, this is due to combination of Actemra and COVID-19 infection itself.  Trend.  Chronic narcotic use.  Had developed narcotic induced toxic encephalopathy in the ICU.  Currently resolved.  Minimize narcotic use.  Currently in no distress.    Condition - Fair  Family Communication  :  Daughter [name] - 09/28/18,  explained the plan that she might come home with home health in 3 to 4 days.  Code Status :  Full  Diet : Regular  Disposition Plan  :  HHPT likely  Consults  :  PCCM  Procedures  :    PUD Prophylaxis : PPI  DVT Prophylaxis  :  Lovenox    Lab Results  Component Value Date   PLT 229 09/28/2018    Inpatient Medications  Scheduled Meds: . atorvastatin  20 mg Oral Daily  . DULoxetine  60 mg Oral Daily  . enoxaparin (LOVENOX) injection  40 mg Subcutaneous Q24H  . guaiFENesin  15 mL Oral Q6H  . levothyroxine  75 mcg Oral Q0600  . lidocaine  1 patch Transdermal Q24H  . mouth rinse  15 mL Mouth Rinse BID  . pantoprazole  40 mg Oral QHS  . potassium chloride  40 mEq Oral BID  . predniSONE  40 mg Oral Q breakfast  . sulfaSALAzine  1,000 mg Oral BID   Continuous Infusions: . sodium chloride 1,000 mL (09/22/18 1454)   PRN Meds:.sodium chloride, acetaminophen, baclofen, chlorpheniramine-HYDROcodone, diphenoxylate-atropine, guaiFENesin-dextromethorphan, lip balm, loperamide, ondansetron (ZOFRAN) IV, sodium chloride  Antibiotics  :    Anti-infectives (From admission, onward)   Start     Dose/Rate Route Frequency Ordered Stop   09/20/18 1000  hydroxychloroquine (PLAQUENIL) tablet 200 mg  Status:  Discontinued     200 mg Oral 2 times daily 09/19/18 1302 09/19/18 1305   09/19/18 1315  hydroxychloroquine (PLAQUENIL) tablet 400 mg  Status:  Discontinued     400 mg Oral 2 times daily 09/19/18 1302 09/19/18 1305   09/18/18 1000  azithromycin (ZITHROMAX) 500 mg in sodium chloride 0.9 % 250 mL IVPB  Status:  Discontinued     500 mg 250 mL/hr over 60 Minutes Intravenous Every 24 hours 09/18/18  [id number] 09/19/18 1011   09/18/18 0930  cefTRIAXone (ROCEPHIN) 1 g in sodium chloride 0.9 % 100 mL IVPB  Status:  Discontinued     1 g 200 mL/hr over 30 Minutes Intravenous Every 24 hours 09/18/18 0821 09/19/18 1011   09/17/18 1700  cefTRIAXone (ROCEPHIN) 1 g in sodium chloride 0.9 % 100 mL IVPB   Status:  Discontinued     1 g 200 mL/hr over 30 Minutes Intravenous  Once 09/17/18 1650 09/17/18 1744   09/17/18 1700  azithromycin (ZITHROMAX) 500 mg in sodium chloride 0.9 % 250 mL IVPB  Status:  Discontinued     500 mg 250 mL/hr over 60 Minutes Intravenous  Once 09/17/18 1650 09/17/18 1744       Time Spent in minutes  30   Susa RaringPrashant Wendy Hoback M.D on 09/28/2018 at 9:14 AM  To page go to [email or web address] - password [email or web address]  [facility] -  Office  [phone]  Admit date - 09/17/2018    11    Objective:   Vitals:   09/27/18 2041 09/28/18 0425 09/28/18 0500 09/28/18 0906  BP: 110/65 115/69  110/82  Pulse:  82  (!) 112  Resp:  (!) 22  (!) 28  Temp: 98.7 F (37.1 C) 98.6 F (37 C)    TempSrc: Oral Oral    SpO2:  99%  (!) 85%  Weight:   61.4 kg   Height:        Wt Readings from Last 3 Encounters:  09/28/18 61.4 kg  11/21/17 67.8 kg  10/15/17 67.1 kg     Intake/Output Summary (Last 24 hours) at 09/28/2018 0914 Last data filed at 09/28/2018 0555 Gross per 24 hour  Intake 609.58 ml  Output 600 ml  Net 9.58 ml     Physical Exam  Awake Alert, Oriented X 3, No new F.N deficits, Normal affect [place].AT,PERRAL Supple Neck,No JVD, No cervical lymphadenopathy appriciated.  Symmetrical Chest wall movement, Good air movement bilaterally, CTAB RRR,No Gallops,Rubs or new Murmurs, No Parasternal Heave +ve B.Sounds, Abd Soft, No tenderness, No organomegaly appriciated, No rebound - guarding or rigidity. No Cyanosis, Clubbing or edema, No new Rash or bruise       Data Review:    CBC Recent Labs  Lab 09/24/18 0715 09/24/18 0953 09/25/18 0125 09/26/18 0135 09/27/18 0205 09/28/18 0535  WBC 19.8*  --  15.5* 10.2 9.7 12.0*  HGB 14.3 14.3 15.2* 13.9 12.6 12.4  HCT 44.4 42.0 48.2* 44.3 40.5 39.3  PLT 426*  --  468* 372 309 229  MCV 100.5*  --  101.7* 100.7* 100.0 101.8*  MCH 32.4  --  32.1 31.6 31.1 32.1  MCHC 32.2  --  31.5 31.4 31.1 31.6  RDW 13.2  --  13.2 12.9 12.9  12.9  LYMPHSABS 1.0  --  1.0 1.2 1.5 1.6  MONOABS 0.9  --  0.7 0.6 0.4 0.5  EOSABS 0.0  --  0.0 0.2 0.3 0.2  BASOSABS 0.1  --  0.1 0.0 0.1 0.1    Chemistries  Recent Labs  Lab 09/22/18 0450  09/23/18 0500  09/24/18 0715 09/24/18 0953 09/25/18 0125 09/26/18 0135 09/26/18 1218 09/27/18 0205 09/28/18 0535  NA 138   < > 141   < >  --  140 146* 138  --  139 138  K 4.1   < > 4.0   < >  --  3.9 3.4* 2.9*  --  3.9 4.4  CL 99  --  99  --   --   --  101 97*  --  101 103  CO2 28  --  26  --   --   --  29 29  --  29 29  GLUCOSE 108*  --  134*  --   --   --  121* 118*  --  101* 93  BUN 31*  --  49*  --   --   --  69* 40*  --  22 16  CREATININE 1.09*  --  1.26*  --  1.30*  --  1.17* 1.03*  --  0.85 0.76  CALCIUM 9.3  --  9.3  --   --   --  9.6 9.1  --  8.8* 8.9  MG  --   --   --   --   --   --   --   --  3.1*  --   --   AST 45*  --  39  --   --   --   --   --   --   --   --   ALT 19  --  17  --   --   --   --   --   --   --   --   ALKPHOS 136*  --  142*  --   --   --   --   --   --   --   --   BILITOT 0.4  --  1.1  --   --   --   --   --   --   --   --    < > = values in this interval not displayed.   ------------------------------------------------------------------------------------------------------------------ No results for input(s): CHOL, HDL, LDLCALC, TRIG, CHOLHDL, LDLDIRECT in the last 72 hours.  Lab Results  Component Value Date   HGBA1C  09/09/2009    5.9 (NOTE) The ADA recommends the following therapeutic goal for glycemic control related to Hgb A1c measurement: Goal of therapy: <6.5 Hgb A1c  Reference: American Diabetes Association: Clinical Practice Recommendations 2010, Diabetes Care, 2010, 33: (Suppl  1).   ------------------------------------------------------------------------------------------------------------------ No results for input(s): TSH, T4TOTAL, T3FREE, THYROIDAB in the last 72 hours.  Invalid input(s): FREET3  ------------------------------------------------------------------------------------------------------------------ Recent Labs    09/26/18 0135 09/27/18 0205  FERRITIN 570* 423*    Coagulation profile No results for input(s): INR, PROTIME in the last 168 hours.  Recent Labs    09/26/18 0135 09/27/18 0205  DDIMER 2.47* 2.51*    Cardiac Enzymes No results for input(s): CKMB, TROPONINI, MYOGLOBIN in the last 168 hours.  Invalid input(s): CK ------------------------------------------------------------------------------------------------------------------ No results found for: BNP  Micro Results No results found for this or any previous visit (from the past 240 hour(s)).  Radiology Reports Dg Chest Port 1 View  Result Date: 09/26/2018 CLINICAL DATA:  Followup pneumonia/ARDS. EXAM: PORTABLE CHEST 1 VIEW COMPARISON:  09/25/2018 and earlier. FINDINGS: Cardiac silhouette normal in size, unchanged. Interstitial and minimal ground-glass airspace opacity in the mid and lower lung zones, LEFT greater than RIGHT, unchanged since yesterday but minimally improved since the chest x-ray on 09/21/2018. No new pulmonary parenchymal abnormalities. Calcifications involving the pseudocapsules of the BILATERAL breast implants again noted. IMPRESSION: 1. Stable mild changes of pneumonia/ARDS since yesterday, improved since 09/21/2018. 2. No new abnormalities. Electronically Signed   By: Hulan Saas M.D.   On: 09/26/2018 15:29   Dg Chest Port 1 View  Result Date: 09/25/2018 CLINICAL DATA:  63 year old female with COVID-19, respiratory failure. EXAM: PORTABLE  CHEST 1 VIEW COMPARISON:  09/21/2018 and earlier. FINDINGS: Portable AP upright view at 0407 hours. Peripheral and basilar predominant bilateral coarse pulmonary interstitial opacity persists. Lung volumes and ventilation are stable. No pneumothorax or pleural effusion. Normal cardiac size and mediastinal contours. Visualized tracheal air column  is within normal limits. Incidental calcified breast implants. Prior ACDF. Negative visible bowel gas pattern. IMPRESSION: Stable ventilation since 09/21/2018 with basilar predominant pulmonary interstitial opacity compatible with COVID-19 pneumonia. Electronically Signed   By: Odessa Fleming M.D.   On: 09/25/2018 06:56   Dg Chest Port 1 View  Result Date: 09/21/2018 CLINICAL DATA:  63 year old female with a history of dyspnea EXAM: PORTABLE CHEST 1 VIEW COMPARISON:  None. FINDINGS: Cardiac diameter borderline enlarged, with left rotation. Reticular pattern of opacities. No large pleural effusion or pneumothorax. Pleuroparenchymal thickening at the apices. Calcified breast implants partially obscure the bases of the lungs. Surgical changes of cervical region. IMPRESSION: Reticular pattern of opacity of the bilateral lung bases, could represent chronic fibrosis/scarring versus atypical infection or bronchitis. Electronically Signed   By: Gilmer Mor D.O.   On: 09/21/2018 10:48   Dg Chest Port 1 View  Result Date: 09/17/2018 CLINICAL DATA:  Shortness of breath EXAM: PORTABLE CHEST 1 VIEW COMPARISON:  Chest radiograph and chest CT Nov 01, 2014 FINDINGS: There is focal airspace opacity in the left base. There is slight right base atelectasis. The lungs elsewhere are clear. Heart size and pulmonary vascularity are normal. No adenopathy. There is aortic atherosclerosis. There are calcified breast implants bilaterally. There is postoperative change in the lower cervical spine. IMPRESSION: Focal airspace opacity in the left base, consistent with pneumonia. Mild right base atelectasis. Stable cardiac silhouette. Calcified breast implants noted. Aortic Atherosclerosis (ICD10-I70.0). Followup PA and lateral chest radiographs recommended in 3-4 weeks following trial of antibiotic therapy to ensure resolution and exclude underlying malignancy. Electronically Signed   By: Bretta Bang III M.D.   On: 09/17/2018 16:15

## 2018-09-28 NOTE — Progress Notes (Signed)
Episode of coughing & desaturation 69% after use of BSC, required increased O2 to 5lnc & Robitussin

## 2018-09-28 NOTE — Progress Notes (Signed)
Called and updated husband. 

## 2018-09-28 NOTE — Progress Notes (Signed)
Initial Nutrition Assessment  RD working remotely.   DOCUMENTATION CODES:   (unable to assess for malnutrition at this time)  INTERVENTION:  - will order Boost Breeze BID, each supplement provides 250 kcal and 9 grams of protein. - will order Ensure Enlive BID, each supplement provides 350 kcal and 20 grams of protein - continue 30 mL Prostat but will decrease to BID, each supplement provides 100 kcal and 15 grams of protein. - will order daily multivitamin with minerals. - continue to encourage PO intakes.    NUTRITION DIAGNOSIS:   Increased nutrient needs related to acute illness(COVID-19) as evidenced by estimated needs.  GOAL:   Patient will meet greater than or equal to 90% of their needs  MONITOR:   PO intake, Supplement acceptance, Labs, Weight trends  REASON FOR ASSESSMENT:   LOS(day #11)  ASSESSMENT:   Admitted to the hospital on 4/16 with fever, cough, nausea, diarrhea, and fatigue that started about 8 days prior to admission.  Her husband was hospitalized at [facility] and diagnosed with COVID-19. In the [facility] ED she tested positive for COVID-19 and CXR was concerning for L lung infiltrate, she had a fever of 102 and developed hypoxic respiratory failure requiring oxygen supplementation. She has improved, oxygenation has improved and she was titrated down to nasal cannula and transferred to [facility] service on 4/27.  BMI indicates normal weight. Per review of RN flow sheet, patient consumed 100% of dinner on 4/18 and then 0% of breakfast 4/19, all meals on 4/20, and breakfast on 4/21. No other meal intake documented since 4/21.    Per chart review, current weight is 135 lb and weight on 11/21/17 was 149 lb. This indicates 14 lb weight loss (9% body weight) in the past 10 months; not significant for time frame. No other more recent weight PTA.   Per Dr. Doristine Church note today: acute hypoxic respiratory failure d/t COVID-19, improving respiratory status and now out of ICU,  on oxygen via [place], titrating oral prednisone, and possible d/c home in 2-3 days if she continues to improve.    Medications reviewed; 75 mcg oral synthroid/day, 20 mg deltasone/day. Labs reviewed.     NUTRITION - FOCUSED PHYSICAL EXAM:  unable to complete at this time.  Diet Order:   Diet Order            Diet regular Room service appropriate? Yes; Fluid consistency: Thin; Fluid restriction: 1200 mL Fluid  Diet effective now              EDUCATION NEEDS:   Not appropriate for education at this time  Skin:  Skin Assessment: Reviewed RN Assessment  Last BM:  4/27  Height:   Ht Readings from Last 1 Encounters:  09/17/18 5\' 3"  (1.6 m)    Weight:   Wt Readings from Last 1 Encounters:  09/28/18 61.4 kg    Ideal Body Weight:  52.27 kg  BMI:  Body mass index is 23.98 kg/m.  Estimated Nutritional Needs:   Kcal:  1840-2150 kcal  Protein:  90-105 grams  Fluid:  >/= 2 L/day     Trenton Gammon, MS, RD, LDN, [facility] Inpatient Clinical Dietitian Pager # [phone] After hours/weekend pager # [phone]

## 2018-09-29 LAB — CBC WITH DIFFERENTIAL/PLATELET
Abs Immature Granulocytes: 0.27 10*3/uL — ABNORMAL HIGH (ref 0.00–0.07)
Basophils Absolute: 0.1 10*3/uL (ref 0.0–0.1)
Basophils Relative: 1 %
Eosinophils Absolute: 0.2 10*3/uL (ref 0.0–0.5)
Eosinophils Relative: 2 %
HCT: 36 % (ref 36.0–46.0)
Hemoglobin: 11.6 g/dL — ABNORMAL LOW (ref 12.0–15.0)
Immature Granulocytes: 3 %
Lymphocytes Relative: 16 %
Lymphs Abs: 1.7 10*3/uL (ref 0.7–4.0)
MCH: 32.6 pg (ref 26.0–34.0)
MCHC: 32.2 g/dL (ref 30.0–36.0)
MCV: 101.1 fL — ABNORMAL HIGH (ref 80.0–100.0)
Monocytes Absolute: 0.5 10*3/uL (ref 0.1–1.0)
Monocytes Relative: 5 %
Neutro Abs: 8.2 10*3/uL — ABNORMAL HIGH (ref 1.7–7.7)
Neutrophils Relative %: 73 %
Platelets: 196 10*3/uL (ref 150–400)
RBC: 3.56 MIL/uL — ABNORMAL LOW (ref 3.87–5.11)
RDW: 13.1 % (ref 11.5–15.5)
WBC: 11 10*3/uL — ABNORMAL HIGH (ref 4.0–10.5)
nRBC: 0 % (ref 0.0–0.2)

## 2018-09-29 LAB — BASIC METABOLIC PANEL
Anion gap: 9 (ref 5–15)
BUN: 14 mg/dL (ref 8–23)
CO2: 27 mmol/L (ref 22–32)
Calcium: 8.7 mg/dL — ABNORMAL LOW (ref 8.9–10.3)
Chloride: 100 mmol/L (ref 98–111)
Creatinine, Ser: 0.75 mg/dL (ref 0.44–1.00)
GFR calc Af Amer: >60 mL/min (ref >60)
GFR calc non Af Amer: >60 mL/min (ref >60)
Glucose, Bld: 80 mg/dL (ref 70–99)
Potassium: 3.8 mmol/L (ref 3.5–5.1)
Sodium: 136 mmol/L (ref 135–145)

## 2018-09-29 LAB — MAGNESIUM: Magnesium: 2 mg/dL (ref 1.7–2.4)

## 2018-09-29 MED ORDER — POTASSIUM CHLORIDE CRYS ER 20 MEQ PO TBCR
40.0000 meq | EXTENDED_RELEASE_TABLET | Freq: Once | ORAL | Status: AC
  Administered 2018-09-29: 40 meq via ORAL
  Filled 2018-09-29: qty 2

## 2018-09-29 MED ORDER — PHENOL 1.4 % MT LIQD
1.0000 | OROMUCOSAL | Status: DC | PRN
  Administered 2018-09-29: 1 via OROMUCOSAL
  Filled 2018-09-29: qty 177

## 2018-09-29 MED ORDER — FUROSEMIDE 10 MG/ML IJ SOLN
40.0000 mg | Freq: Once | INTRAMUSCULAR | Status: AC
  Administered 2018-09-29: 13:00:00 40 mg via INTRAVENOUS
  Filled 2018-09-29: qty 4

## 2018-09-29 NOTE — Progress Notes (Signed)
PROGRESS NOTE                                                                                                                                                                                                             Patient Demographics:    Becky Boyd, is a 63 y.o. female, DOB - Jul 11, 1955, OIT:[id number]  Outpatient Primary MD for the patient is Tabori, Helane Rima, MD    LOS - 12  Chief Complaint  Patient presents with  . Shortness of Breath  . Fever  . Cough       Brief Narrative  63 year old female PMHx rheumatoid arthritis on chronic immunosuppressive therapy (prednisone+ SulfaSalazine), Sjogren, Hashimoto thyroiditis, fibromyalgia   Admitted to the hospital on 09/17/2018 with fever, cough, nausea diarrhea and fatigue that started about 8 days prior to admission. Her husband was hospitalized at [facility] and diagnosed with COVID-19. In our ED she was positive for coronavirus, chest x-ray was concerning for left lung infiltrate, she had a fever of 102 and developed hypoxic respiratory failure requiring oxygen supplementation.  Approximate date Covid symptoms started: 09/09/2018. Patient has already finished a course of antibiotics with ceftriaxone and azithromycin on 4/17.  She see if 2 doses of Actemra along with IV steroids and was monitored in ICU for several days, now clinically improved.  She was titrated down to nasal cannula oxygen and transferred to my service on 09/28/2018.    Subjective:   Patient states that she is feeling better compared to how she was when she came into the hospital.  However she remains short of breath even with minimal exertion.  Continues to have a cough with clear expectoration.     Assessment  & Plan :      Acute Hypoxic Resp. Failure due to Acute Covid 19 Viral Illness during the ongoing 2020 Covid 19 Pandemic Required monitoring in ICU with high flow nasal cannula oxygen  under the care of pulmonary critical care, also had some evidence of bacterial pneumonia hence has completed her antibiotic course.  She also received 2 doses of Actemra on 4/18 and 4/20 respectively.  He is currently on oral steroids.  She has improved however still requiring 4 to 5 L of oxygen by nasal cannula.  PT and OT evaluating.  Prone position as much as possible.  Incentive spirometry.  Flutter  valve.  Will need titration of her prednisone down to her home dose eventually.  Give 1 dose of furosemide today.  Of note: Patient's husband is also positive for COVID-19 infection and is currently recuperating at home with home health.  Chronic diastolic CHF with mild acute decompensation in ICU Last echocardiogram 08/30/2014 grade 1 diastolic dysfunction normal EF of around 60% - 65%.  She required IV Lasix for a few days in ICU.  Currently compensated.  Rheumatoid arthritis/Sjogren's Syndrome SulfaSalazine twice daily, also on prednisone taper.  Please note patient was on 5 mg of prednisone chronically.  Hypothyroidism Synthroid 75 mcg  Elevated LFTs This was mild elevation.  Subsequently normal.    Chronic narcotic use Had developed narcotic induced toxic encephalopathy in the ICU.  Currently resolved.  Minimize narcotic use.  Currently in no distress.  Family Communication  : Will discuss with daughter today.   Code Status :  Full  Diet : Regular  Disposition Plan  :  HHPT likely.  Still requiring very high doses of oxygen.  Consults  :  PCCM  Procedures  :    PUD Prophylaxis : PPI  DVT Prophylaxis  :  Lovenox    Lab Results  Component Value Date   PLT 196 09/29/2018    Inpatient Medications  Scheduled Meds: . atorvastatin  20 mg Oral Daily  . DULoxetine  60 mg Oral Daily  . enoxaparin (LOVENOX) injection  40 mg Subcutaneous Q24H  . feeding supplement  1 Container Oral BID BM  . feeding supplement (ENSURE ENLIVE)  237 mL Oral BID BM  . feeding  supplement (PRO-STAT SUGAR FREE 64)  30 mL Oral BID  . guaiFENesin  15 mL Oral Q6H  . levothyroxine  75 mcg Oral Q0600  . lidocaine  1 patch Transdermal Q24H  . mouth rinse  15 mL Mouth Rinse BID  . multivitamin with minerals  1 tablet Oral Daily  . pantoprazole  40 mg Oral QHS  . predniSONE  20 mg Oral Q breakfast  . sulfaSALAzine  1,000 mg Oral BID   Continuous Infusions: . sodium chloride 1,000 mL (09/22/18 1454)   PRN Meds:.sodium chloride, acetaminophen, baclofen, chlorpheniramine-HYDROcodone, diphenoxylate-atropine, guaiFENesin-dextromethorphan, lip balm, loperamide, ondansetron (ZOFRAN) IV, sodium chloride  Antibiotics  :    Anti-infectives (From admission, onward)   Start     Dose/Rate Route Frequency Ordered Stop   09/20/18 1000  hydroxychloroquine (PLAQUENIL) tablet 200 mg  Status:  Discontinued     200 mg Oral 2 times daily 09/19/18 1302 09/19/18 1305   09/19/18 1315  hydroxychloroquine (PLAQUENIL) tablet 400 mg  Status:  Discontinued     400 mg Oral 2 times daily 09/19/18 1302 09/19/18 1305   09/18/18 1000  azithromycin (ZITHROMAX) 500 mg in sodium chloride 0.9 % 250 mL IVPB  Status:  Discontinued     500 mg 250 mL/hr over 60 Minutes Intravenous Every 24 hours 09/18/18 0821 09/19/18 1011   09/18/18 0930  cefTRIAXone (ROCEPHIN) 1 g in sodium chloride 0.9 % 100 mL IVPB  Status:  Discontinued     1 g 200 mL/hr over 30 Minutes Intravenous Every 24 hours 09/18/18 0821 09/19/18 1011   09/17/18 1700  cefTRIAXone (ROCEPHIN) 1 g in sodium chloride 0.9 % 100 mL IVPB  Status:  Discontinued     1 g 200 mL/hr over 30 Minutes Intravenous  Once 09/17/18 1650 09/17/18 1744   09/17/18 1700  azithromycin (ZITHROMAX) 500 mg in sodium chloride 0.9 % 250 mL  IVPB  Status:  Discontinued     500 mg 250 mL/hr over 60 Minutes Intravenous  Once 09/17/18 1650 09/17/18 1744       Osvaldo Shipper M.D on 09/29/2018 at 12:22 PM  To page go to [email or web address] - password [facility]  [facility] -   Office  [phone]  Admit date - 09/17/2018      Objective:   Vitals:   09/28/18 2000 09/28/18 2100 09/28/18 2148 09/29/18 0621  BP: 106/80   120/68  Pulse: 93     Resp: (!) 31 (!) 22    Temp:   98.8 F (37.1 C) 98.2 F (36.8 C)  TempSrc:   Oral   SpO2: 92%     Weight:      Height:        Wt Readings from Last 3 Encounters:  09/28/18 61.4 kg  11/21/17 67.8 kg  10/15/17 67.1 kg     Intake/Output Summary (Last 24 hours) at 09/29/2018 1222 Last data filed at 09/28/2018 1929 Gross per 24 hour  Intake 180 ml  Output -  Net 180 ml     Physical Exam General appearance: Awake alert.  In no distress Resp: Continues to have crackles bilaterally.  No wheezing or rhonchi. Cardio: S1-S2 is normal regular.  No S3-S4.  No rubs murmurs or bruit GI: Abdomen is soft.  Nontender nondistended.  Bowel sounds are present normal.  No masses organomegaly Extremities: No edema.  Full range of motion of lower extremities. Neurologic: Alert and oriented x3.  No focal neurological deficits.      Data Review:    CBC Recent Labs  Lab 09/25/18 0125 09/26/18 0135 09/27/18 0205 09/28/18 0535 09/29/18 0450  WBC 15.5* 10.2 9.7 12.0* 11.0*  HGB 15.2* 13.9 12.6 12.4 11.6*  HCT 48.2* 44.3 40.5 39.3 36.0  PLT 468* 372 309 229 196  MCV 101.7* 100.7* 100.0 101.8* 101.1*  MCH 32.1 31.6 31.1 32.1 32.6  MCHC 31.5 31.4 31.1 31.6 32.2  RDW 13.2 12.9 12.9 12.9 13.1  LYMPHSABS 1.0 1.2 1.5 1.6 1.7  MONOABS 0.7 0.6 0.4 0.5 0.5  EOSABS 0.0 0.2 0.3 0.2 0.2  BASOSABS 0.1 0.0 0.1 0.1 0.1    Chemistries  Recent Labs  Lab 09/23/18 0500  09/25/18 0125 09/26/18 0135 09/26/18 1218 09/27/18 0205 09/28/18 0535 09/29/18 0450  NA 141   < > 146* 138  --  139 138 136  K 4.0   < > 3.4* 2.9*  --  3.9 4.4 3.8  CL 99  --  101 97*  --  101 103 100  CO2 26  --  29 29  --  GLUCOSE 134*  --  121* 118*  --  101* 93 80  BUN 49*  --  69* 40*  --  CREATININE 1.26*   < > 1.17* 1.03*  --   0.85 0.76 0.75  CALCIUM 9.3  --  9.6 9.1  --  8.8* 8.9 8.7*  MG  --   --   --   --  3.1*  --   --  2.0  AST 39  --   --   --   --   --   --   --   ALT 17  --   --   --   --   --   --   --   ALKPHOS 142*  --   --   --   --   --   --   --  BILITOT 1.1  --   --   --   --   --   --   --    < > = values in this interval not displayed.     Lab Results  Component Value Date   HGBA1C  09/09/2009    5.9 (NOTE) The ADA recommends the following therapeutic goal for glycemic control related to Hgb A1c measurement: Goal of therapy: <6.5 Hgb A1c  Reference: American Diabetes Association: Clinical Practice Recommendations 2010, Diabetes Care, 2010, 33: (Suppl  1).    Recent Labs    09/27/18 0205  FERRITIN 423*     Recent Labs    09/27/18 0205  DDIMER 2.51*    Micro Results No results found for this or any previous visit (from the past 240 hour(s)).  Radiology Reports Dg Chest Port 1 View  Result Date: 09/26/2018 CLINICAL DATA:  Followup pneumonia/ARDS. EXAM: PORTABLE CHEST 1 VIEW COMPARISON:  09/25/2018 and earlier. FINDINGS: Cardiac silhouette normal in size, unchanged. Interstitial and minimal ground-glass airspace opacity in the mid and lower lung zones, LEFT greater than RIGHT, unchanged since yesterday but minimally improved since the chest x-ray on 09/21/2018. No new pulmonary parenchymal abnormalities. Calcifications involving the pseudocapsules of the BILATERAL breast implants again noted. IMPRESSION: 1. Stable mild changes of pneumonia/ARDS since yesterday, improved since 09/21/2018. 2. No new abnormalities. Electronically Signed   By: Hulan Saashomas  Lawrence M.D.   On: 09/26/2018 15:29   Dg Chest Port 1 View  Result Date: 09/25/2018 CLINICAL DATA:  63 year old female with COVID-19, respiratory failure. EXAM: PORTABLE CHEST 1 VIEW COMPARISON:  09/21/2018 and earlier. FINDINGS: Portable AP upright view at 0407 hours. Peripheral and basilar predominant bilateral coarse pulmonary  interstitial opacity persists. Lung volumes and ventilation are stable. No pneumothorax or pleural effusion. Normal cardiac size and mediastinal contours. Visualized tracheal air column is within normal limits. Incidental calcified breast implants. Prior ACDF. Negative visible bowel gas pattern. IMPRESSION: Stable ventilation since 09/21/2018 with basilar predominant pulmonary interstitial opacity compatible with COVID-19 pneumonia. Electronically Signed   By: Odessa FlemingH  Hall M.D.   On: 09/25/2018 06:56   Dg Chest Port 1 View  Result Date: 09/21/2018 CLINICAL DATA:  63 year old female with a history of dyspnea EXAM: PORTABLE CHEST 1 VIEW COMPARISON:  None. FINDINGS: Cardiac diameter borderline enlarged, with left rotation. Reticular pattern of opacities. No large pleural effusion or pneumothorax. Pleuroparenchymal thickening at the apices. Calcified breast implants partially obscure the bases of the lungs. Surgical changes of cervical region. IMPRESSION: Reticular pattern of opacity of the bilateral lung bases, could represent chronic fibrosis/scarring versus atypical infection or bronchitis. Electronically Signed   By: Gilmer MorJaime  Wagner D.O.   On: 09/21/2018 10:48   Dg Chest Port 1 View  Result Date: 09/17/2018 CLINICAL DATA:  Shortness of breath EXAM: PORTABLE CHEST 1 VIEW COMPARISON:  Chest radiograph and chest CT Nov 01, 2014 FINDINGS: There is focal airspace opacity in the left base. There is slight right base atelectasis. The lungs elsewhere are clear. Heart size and pulmonary vascularity are normal. No adenopathy. There is aortic atherosclerosis. There are calcified breast implants bilaterally. There is postoperative change in the lower cervical spine. IMPRESSION: Focal airspace opacity in the left base, consistent with pneumonia. Mild right base atelectasis. Stable cardiac silhouette. Calcified breast implants noted. Aortic Atherosclerosis (ICD10-I70.0). Followup PA and lateral chest radiographs recommended in  3-4 weeks following trial of antibiotic therapy to ensure resolution and exclude underlying malignancy. Electronically Signed   By: Bretta BangWilliam  Woodruff III M.D.  On: 09/17/2018 16:15

## 2018-09-29 NOTE — Progress Notes (Signed)
Physical Therapy Treatment Patient Details Name: Becky Boyd MRN: [id number] DOB: 02-15-1956 Today's Date: 09/29/2018    History of Present Illness 63 year old female PMHx rheumatoid arthritis on chronic immunosuppressive therapy, Sjogren, Hashimoto thyroiditis, fibromyalgia;  Admitted to the hospital on 09/17/2018 with fever, cough, nausea diarrhea and fatigue that started about 8 days prior to admission.  Her husband was hospitalized at [facility] and diagnosed with COVID-19.  In our ED she was positive for coronavirus, chest x-ray was concerning for left lung infiltrate, she had a fever of 102 and developed hypoxic respiratory failure requiring oxygen supplementation.  Approximate date Covid symptoms started: 09/09/2018.     PT Comments    Pt progressing although with c/o severe fatigue today; pt is motivated, will try to see again today, schedule permitting. VS--rest/exertional/rest HR 109--130--109 SpO2=84%--82%--93% on 4L (left on 4L and Sats acceptable range, RN had incr to 5L earlier) RR 22--38--22    Follow Up Recommendations  Home health PT;Supervision - Intermittent(vs no f/u with HEP--if HHPT won't see )     Equipment Recommendations  3in1 (PT)    Recommendations for Other Services       Precautions / Restrictions Precautions Precautions: Fall Precaution Comments: monitor VS    Mobility  Bed Mobility Overal bed mobility: Needs Assistance Bed Mobility: Sit to Supine       Sit to supine: Supervision   General bed mobility comments: incr time and effort  Transfers Overall transfer level: Needs assistance Equipment used: None;1 person hand held assist Transfers: Sit to/from [employer] Transfers Sit to Stand: Min guard;Supervision Stand pivot transfers: Supervision;Min guard       General transfer comment: steadying assist to rise   Ambulation/Gait Ambulation/Gait assistance: Min guard;Min assist Gait Distance (Feet): 120 Feet(15' more in  room) Assistive device: None;1 person hand held assist Gait Pattern/deviations: Decreased stride length;Step-through pattern     General Gait Details: unsteady gait, diminished push off bil, LOB x2 with light min assist to recover    Stairs             Wheelchair Mobility    Modified Rankin (Stroke Patients Only)       Balance Overall balance assessment: Needs assistance   Sitting balance-Leahy Scale: Good       Standing balance-Leahy Scale: Fair Standing balance comment: requires unilateral UE support for dynamic balance; pt able to stand at sink, wash hands, brush teeth                            Cognition Arousal/Alertness: Awake/alert Behavior During Therapy: WFL for tasks assessed/performed Overall Cognitive Status: Within Functional Limits for tasks assessed                                        Exercises      General Comments        Pertinent Vitals/Pain Pain Assessment: No/denies pain    Home Living                      Prior Function            PT Goals (current goals can now be found in the care plan section) Acute Rehab PT Goals Patient Stated Goal: to get better and go home PT Goal Formulation: With patient Time For Goal Achievement: 10/12/18 Potential to Achieve Goals: Good Progress  towards PT goals: Progressing toward goals    Frequency    Min 4X/week      PT Plan Current plan remains appropriate    Co-evaluation              AM-PAC PT "6 Clicks" Mobility   Outcome Measure  Help needed turning from your back to your side while in a flat bed without using bedrails?: A Little Help needed moving from lying on your back to sitting on the side of a flat bed without using bedrails?: A Little Help needed moving to and from a bed to a chair (including a wheelchair)?: A Little Help needed standing up from a chair using your arms (e.g., wheelchair or bedside chair)?: A Little Help needed  to walk in hospital room?: A Little Help needed climbing 3-5 steps with a railing? : A Lot 6 Click Score: 17    End of Session Equipment Utilized During Treatment: Oxygen Activity Tolerance: Patient tolerated treatment well Patient left: with call bell/phone within reach;in bed Nurse Communication: Mobility status PT Visit Diagnosis: Unsteadiness on feet (R26.81)     Time: 6948-5462 PT Time Calculation (min) (ACUTE ONLY): 24 min  Charges:  $Gait Training: 23-37 mins                     Drucilla Chalet, PT  Pager: [phone] Acute Rehab Dept [facility]): [phone]   09/29/2018    [facility] 09/29/2018, 11:25 AM

## 2018-09-29 NOTE — Progress Notes (Signed)
PT TREATMENT NOTE--PM SESSION  09/29/18 1400  PT Visit Information   Pt is progressing well; VSS this pm session, multiple rests needed however pt with less desaturation and improved recovery time. Pt is motivated to get home and see her dogs. Continue PT in acute setting   Last PT Received On 09/29/18  Assistance Needed +1  History of Present Illness 63 year old female PMHx rheumatoid arthritis on chronic immunosuppressive therapy, Sjogren, Hashimoto thyroiditis, fibromyalgia;  Admitted to the hospital on 09/17/2018 with fever, cough, nausea diarrhea and fatigue that started about 8 days prior to admission.  [facility].  In our ED she was positive for coronavirus, chest x-ray was concerning for left lung infiltrate, she had a fever of 102 and developed hypoxic respiratory failure requiring oxygen supplementation.  Approximate date Covid symptoms started: 09/09/2018.   Subjective Data  Patient Stated Goal to get better and go home  Precautions  Precautions Fall  Precaution Comments monitor VS  Pain Assessment  Pain Assessment No/denies pain  Cognition  Arousal/Alertness Awake/alert  Behavior During Therapy WFL for tasks assessed/performed  Overall Cognitive Status Within Functional Limits for tasks assessed  Bed Mobility  Overal bed mobility Needs Assistance  Bed Mobility Sit to Supine  Sit to supine Modified independent (Device/Increase time)  General bed mobility comments no physical assist, HOB 20*'  Transfers  Overall transfer level Needs assistance  Equipment used None  Transfers Sit to/from Stand  Sit to Stand Min guard;Supervision  Stand pivot transfers Supervision;Min guard (x3)  General transfer comment for safety  Ambulation/Gait  Ambulation/Gait assistance Min guard  Gait Distance (Feet) 150 Feet  Assistive device None  Gait Pattern/deviations Decreased stride length;Step-through pattern  General Gait  Details unsteady gait, diminished push off bil, LOB x1 with delayed reaction time but no physical assist to recover   Balance  Sitting balance-Leahy Scale Good  Standing balance-Leahy Scale Fair  Standing balance comment requires light  unilateral UE support for dynamic balance, improving   General Exercises - Lower Extremity  Long Arc Quad AROM;Both;10 reps;Seated  Heel Raises AROM;Both;10 reps  Other Exercises  Other Exercises repeated sit to stands 5 reps with emphasis on slowing speed of movement and posterior chain   PT - End of Session  Equipment Utilized During Treatment Oxygen  Activity Tolerance Patient tolerated treatment well  Patient left in bed;with call bell/phone within reach  Nurse Communication Mobility status   PT - Assessment/Plan  PT Plan Current plan remains appropriate  PT Visit Diagnosis Unsteadiness on feet (R26.81)  PT Frequency (ACUTE ONLY) Min 4X/week  Follow Up Recommendations Home health PT;Supervision - Intermittent (vs no f/u with HEP--if HHPT won't see without 2 negatives)  PT equipment 3in1 (PT)  AM-PAC PT "6 Clicks" Mobility Outcome Measure (Version 2)  Help needed turning from your back to your side while in a flat bed without using bedrails? 3  Help needed moving from lying on your back to sitting on the side of a flat bed without using bedrails? 4  Help needed moving to and from a bed to a chair (including a wheelchair)? 3  Help needed standing up from a chair using your arms (e.g., wheelchair or bedside chair)? 3  Help needed to walk in hospital room? 3  Help needed climbing 3-5 steps with a railing?  3  6 Click Score 19  Consider Recommendation of Discharge [facility]  PT Goal Progression  Progress towards PT goals  Progressing toward goals  Acute Rehab PT Goals  PT Goal Formulation With patient  Time For Goal Achievement 10/12/18  Potential to Achieve Goals Good  PT Time Calculation  PT Start Time (ACUTE ONLY) 1415  PT Stop Time  (ACUTE ONLY) 1439  PT Time Calculation (min) (ACUTE ONLY) 24 min  PT General Charges  $$ ACUTE PT VISIT 1 Visit  PT Treatments  $Gait Training 8-22 mins  $Therapeutic Activity 8-22 mins

## 2018-09-30 LAB — BASIC METABOLIC PANEL
Anion gap: 10 (ref 5–15)
BUN: 18 mg/dL (ref 8–23)
CO2: 29 mmol/L (ref 22–32)
Calcium: 9.1 mg/dL (ref 8.9–10.3)
Chloride: 97 mmol/L — ABNORMAL LOW (ref 98–111)
Creatinine, Ser: 0.87 mg/dL (ref 0.44–1.00)
GFR calc Af Amer: >60 mL/min (ref >60)
GFR calc non Af Amer: >60 mL/min (ref >60)
Glucose, Bld: 89 mg/dL (ref 70–99)
Potassium: 3.7 mmol/L (ref 3.5–5.1)
Sodium: 136 mmol/L (ref 135–145)

## 2018-09-30 LAB — CBC WITH DIFFERENTIAL/PLATELET
Abs Immature Granulocytes: 0.36 10*3/uL — ABNORMAL HIGH (ref 0.00–0.07)
Basophils Absolute: 0.1 10*3/uL (ref 0.0–0.1)
Basophils Relative: 1 %
Eosinophils Absolute: 0.1 10*3/uL (ref 0.0–0.5)
Eosinophils Relative: 1 %
HCT: 39.2 % (ref 36.0–46.0)
Hemoglobin: 12.2 g/dL (ref 12.0–15.0)
Immature Granulocytes: 3 %
Lymphocytes Relative: 12 %
Lymphs Abs: 1.5 10*3/uL (ref 0.7–4.0)
MCH: 31 pg (ref 26.0–34.0)
MCHC: 31.1 g/dL (ref 30.0–36.0)
MCV: 99.7 fL (ref 80.0–100.0)
Monocytes Absolute: 0.9 10*3/uL (ref 0.1–1.0)
Monocytes Relative: 7 %
Neutro Abs: 9 10*3/uL — ABNORMAL HIGH (ref 1.7–7.7)
Neutrophils Relative %: 76 %
Platelets: 233 10*3/uL (ref 150–400)
RBC: 3.93 MIL/uL (ref 3.87–5.11)
RDW: 13 % (ref 11.5–15.5)
WBC: 11.9 10*3/uL — ABNORMAL HIGH (ref 4.0–10.5)
nRBC: 0 % (ref 0.0–0.2)

## 2018-09-30 LAB — GLUCOSE, CAPILLARY
Glucose-Capillary: 100 mg/dL — ABNORMAL HIGH (ref 70–99)
Glucose-Capillary: 143 mg/dL — ABNORMAL HIGH (ref 70–99)

## 2018-09-30 MED ORDER — POTASSIUM CHLORIDE CRYS ER 20 MEQ PO TBCR
40.0000 meq | EXTENDED_RELEASE_TABLET | Freq: Once | ORAL | Status: AC
  Administered 2018-09-30: 40 meq via ORAL
  Filled 2018-09-30: qty 2

## 2018-09-30 MED ORDER — FUROSEMIDE 10 MG/ML IJ SOLN
40.0000 mg | Freq: Once | INTRAMUSCULAR | Status: AC
  Administered 2018-09-30: 12:00:00 40 mg via INTRAVENOUS
  Filled 2018-09-30: qty 4

## 2018-09-30 NOTE — Progress Notes (Signed)
Spoke with daughter [name] to update on patient condition. She was happy and appreciative and states that she did not have any questions.

## 2018-09-30 NOTE — Progress Notes (Signed)
Pulse Ox needs to be applied to patient's ear for proper reading.

## 2018-09-30 NOTE — Progress Notes (Signed)
Physical Therapy Treatment Patient Details Name: Becky Boyd MRN: [id number] DOB: 1955/08/10 Today's Date: 09/30/2018    History of Present Illness 63 year old female PMHx rheumatoid arthritis on chronic immunosuppressive therapy, Sjogren, Hashimoto thyroiditis, fibromyalgia;  Admitted to the hospital on 09/17/2018 with fever, cough, nausea diarrhea and fatigue that started about 8 days prior to admission.  Her husband was hospitalized at [facility] and diagnosed with COVID-19.  In our ED she was positive for coronavirus, chest x-ray was concerning for left lung infiltrate, she had a fever of 102 and developed hypoxic respiratory failure requiring oxygen supplementation.  Approximate date Covid symptoms started: 09/09/2018.     PT Comments    The patient reports not feeling well due  Milk at breakfast. Patient requests a shower if possible  patient ambulated on 4 L [place] to keep sats >90%. HR 121. RR31. Continue  PT  Follow Up Recommendations  Home health PT;Supervision - Intermittent     Equipment Recommendations  3in1 (PT)    Recommendations for Other Services       Precautions / Restrictions Precautions Precaution Comments: monitor VS    Mobility  Bed Mobility               General bed mobility comments: oob  Transfers   Equipment used: None   Sit to Stand: Independent         General transfer comment: patient getting self up from recliner and goes to [facility] across room  Ambulation/Gait Ambulation/Gait assistance: Min guard Gait Distance (Feet): 160 Feet Assistive device: None Gait Pattern/deviations: Decreased stride length;Step-through pattern     General Gait Details: gait unsteady at times. Encouraged PLB HR 121, sats on 4 L 91%, RR 31   Stairs             Wheelchair Mobility    Modified Rankin (Stroke Patients Only)       Balance                                            Cognition Arousal/Alertness: Awake/alert                                      General Comments: patient states that she doesn't feel as well, had milk that didn't agree      Exercises      General Comments        Pertinent Vitals/Pain Pain Assessment: No/denies pain    Home Living                      Prior Function            PT Goals (current goals can now be found in the care plan section) Progress towards PT goals: Progressing toward goals    Frequency    Min 4X/week      PT Plan Current plan remains appropriate    Co-evaluation              AM-PAC PT "6 Clicks" Mobility   Outcome Measure  Help needed turning from your back to your side while in a flat bed without using bedrails?: A Little Help needed moving from lying on your back to sitting on the side of a flat bed without using bedrails?: A Little Help needed moving  to and from a bed to a chair (including a wheelchair)?: A Little Help needed standing up from a chair using your arms (e.g., wheelchair or bedside chair)?: A Little Help needed to walk in hospital room?: A Little Help needed climbing 3-5 steps with a railing? : A Little 6 Click Score: 18    End of Session Equipment Utilized During Treatment: Oxygen Activity Tolerance: Patient tolerated treatment well Patient left: in chair;with call bell/phone within reach Nurse Communication: Mobility status PT Visit Diagnosis: Unsteadiness on feet (R26.81)     Time: 1345-1400 PT Time Calculation (min) (ACUTE ONLY): 15 min  Charges:  $Gait Training: 8-22 mins                     Blanchard Kelch PT Acute Rehabilitation Services Pager [phone] Office [phone]    Rada Hay 09/30/2018, 4:33 PM

## 2018-09-30 NOTE — Progress Notes (Signed)
PROGRESS NOTE                                                                                                                                                                                                             Patient Demographics:    Becky Boyd, is a 63 y.o. female, DOB - 03/06/1956, XIH:[id number]  Outpatient Primary MD for the patient is Tabori, Helane Rima, MD    LOS - 13  Chief Complaint  Patient presents with  . Shortness of Breath  . Fever  . Cough       Brief Narrative  63 year old female PMHx rheumatoid arthritis on chronic immunosuppressive therapy (prednisone+ SulfaSalazine), Sjogren, Hashimoto thyroiditis, fibromyalgia   Admitted to the hospital on 09/17/2018 with fever, cough, nausea diarrhea and fatigue that started about 8 days prior to admission. Her husband was hospitalized at [facility] and diagnosed with COVID-19. In our ED she was positive for coronavirus, chest x-ray was concerning for left lung infiltrate, she had a fever of 102 and developed hypoxic respiratory failure requiring oxygen supplementation.  Approximate date Covid symptoms started: 09/09/2018. Patient has already finished a course of antibiotics with ceftriaxone and azithromycin on 4/17.  She see if 2 doses of Actemra along with IV steroids and was monitored in ICU for several days, now clinically improved.  She was titrated down to nasal cannula oxygen and transferred to [facility] service on 09/28/2018.    Subjective:   Patient states that she continues to feel better.  She slept well.  No cough this morning.    Assessment  & Plan :      Acute Hypoxic Resp. Failure due to Acute Covid 19 Viral Illness during the ongoing 2020 Covid 19 Pandemic Required monitoring in ICU with high flow nasal cannula oxygen, also had some evidence of bacterial pneumonia.  She has completed her antibiotic course. She also received 2 doses of Actemra  on 4/18 and 4/20 respectively.  She is currently on oral steroids.  Still requiring 4 to 5 L of oxygen by nasal cannula.  Saturations noted to be 91%.  Continue to wean down as much as possible.  PT and OT evaluation.  Incentive spirometry and flutter valve.  And a dose of Lasix yesterday.  Give additional dose today. Of note: Patient's husband is also positive for COVID-19 infection and is  currently recuperating at home with home health.  Chronic diastolic CHF with mild acute decompensation in ICU Last echocardiogram 08/30/2014 grade 1 diastolic dysfunction normal EF of around 60% - 65%.  Being evaluated for diuretics on a daily basis.  Rheumatoid arthritis/Sjogren's Syndrome SulfaSalazine 1000milligrams twice daily, also on prednisone taper.  Please note patient was on 5 mg of prednisone chronically.  Hypothyroidism Continue levothyroxine.  Elevated LFTs This was mild elevation.  Subsequently normal.    Chronic narcotic use Had developed narcotic induced toxic encephalopathy in the ICU.  Currently resolved.  Minimize narcotic use.  Currently in no distress.  Family Communication  : Discussed with her daughter yesterday.  Code Status :  Full  Diet : Regular  Disposition Plan  :  HHPT likely.  Still requiring very high doses of oxygen.  Consults  :  PCCM  PUD Prophylaxis : PPI  DVT Prophylaxis  :  Lovenox    Lab Results  Component Value Date   PLT 233 09/30/2018    Inpatient Medications  Scheduled Meds: . atorvastatin  20 mg Oral Daily  . DULoxetine  60 mg Oral Daily  . enoxaparin (LOVENOX) injection  40 mg Subcutaneous Q24H  . feeding supplement  1 Container Oral BID BM  . feeding supplement (ENSURE ENLIVE)  237 mL Oral BID BM  . feeding supplement (PRO-STAT SUGAR FREE 64)  30 mL Oral BID  . guaiFENesin  15 mL Oral Q6H  . levothyroxine  75 mcg Oral Q0600  . lidocaine  1 patch Transdermal Q24H  . mouth rinse  15 mL Mouth Rinse BID  . multivitamin with minerals  1  tablet Oral Daily  . pantoprazole  40 mg Oral QHS  . predniSONE  20 mg Oral Q breakfast  . sulfaSALAzine  1,000 mg Oral BID   Continuous Infusions: . sodium chloride 1,000 mL (09/22/18 1454)   PRN Meds:.sodium chloride, acetaminophen, baclofen, chlorpheniramine-HYDROcodone, diphenoxylate-atropine, guaiFENesin-dextromethorphan, lip balm, loperamide, ondansetron (ZOFRAN) IV, phenol, sodium chloride  Antibiotics  :    Anti-infectives (From admission, onward)   Start     Dose/Rate Route Frequency Ordered Stop   09/20/18 1000  hydroxychloroquine (PLAQUENIL) tablet 200 mg  Status:  Discontinued     200 mg Oral 2 times daily 09/19/18 1302 09/19/18 1305   09/19/18 1315  hydroxychloroquine (PLAQUENIL) tablet 400 mg  Status:  Discontinued     400 mg Oral 2 times daily 09/19/18 1302 09/19/18 1305   09/18/18 1000  azithromycin (ZITHROMAX) 500 mg in sodium chloride 0.9 % 250 mL IVPB  Status:  Discontinued     500 mg 250 mL/hr over 60 Minutes Intravenous Every 24 hours 09/18/18 0821 09/19/18 1011   09/18/18 0930  cefTRIAXone (ROCEPHIN) 1 g in sodium chloride 0.9 % 100 mL IVPB  Status:  Discontinued     1 g 200 mL/hr over 30 Minutes Intravenous Every 24 hours 09/18/18 0821 09/19/18 1011   09/17/18 1700  cefTRIAXone (ROCEPHIN) 1 g in sodium chloride 0.9 % 100 mL IVPB  Status:  Discontinued     1 g 200 mL/hr over 30 Minutes Intravenous  Once 09/17/18 1650 09/17/18 1744   09/17/18 1700  azithromycin (ZITHROMAX) 500 mg in sodium chloride 0.9 % 250 mL IVPB  Status:  Discontinued     500 mg 250 mL/hr over 60 Minutes Intravenous  Once 09/17/18 1650 09/17/18 1744        Objective:   Vitals:   09/30/18 0400 09/30/18 0404 09/30/18 0500 09/30/18 0728  BP:  125/82 125/85  109/69  Pulse: 84 85  78  Resp: 17 20  19   Temp:  98.3 F (36.8 C)  98.3 F (36.8 C)  TempSrc:  Oral  Oral  SpO2: 98% 98%  97%  Weight:   60.9 kg   Height:        Wt Readings from Last 3 Encounters:  09/30/18 60.9 kg   11/21/17 67.8 kg  10/15/17 67.1 kg     Intake/Output Summary (Last 24 hours) at 09/30/2018 1058 Last data filed at 09/30/2018 0500 Gross per 24 hour  Intake 440 ml  Output -  Net 440 ml     Physical Exam  General appearance: Awake alert.  In no distress Resp: Diminished air entry at the bases.  Few crackles bilaterally.  No wheezing or rhonchi.   Cardio: S1-S2 is normal regular.  No S3-S4.  No rubs murmurs or bruit GI: Abdomen is soft.  Nontender nondistended.  Bowel sounds are present normal.  No masses organomegaly Extremities: No edema.  Full range of motion of lower extremities. Neurologic: Alert and oriented x3.  No focal neurological deficits.     Data Review:    CBC Recent Labs  Lab 09/26/18 0135 09/27/18 0205 09/28/18 0535 09/29/18 0450 09/30/18 0500  WBC 10.2 9.7 12.0* 11.0* 11.9*  HGB 13.9 12.6 12.4 11.6* 12.2  HCT 44.3 40.5 39.3 36.0 39.2  PLT 372 309 229 196 233  MCV 100.7* 100.0 101.8* 101.1* 99.7  MCH 31.6 31.1 32.1 32.6 31.0  MCHC 31.4 31.1 31.6 32.2 31.1  RDW 12.9 12.9 12.9 13.1 13.0  LYMPHSABS 1.2 1.5 1.6 1.7 1.5  MONOABS 0.6 0.4 0.5 0.5 0.9  EOSABS 0.2 0.3 0.2 0.2 0.1  BASOSABS 0.0 0.1 0.1 0.1 0.1    Chemistries  Recent Labs  Lab 09/26/18 0135 09/26/18 1218 09/27/18 0205 09/28/18 0535 09/29/18 0450 09/30/18 0500  NA 138  --  139 138 136 136  K 2.9*  --  3.9 4.4 3.8 3.7  CL 97*  --  101 103 100 97*  CO2 29  --  29 29 27 29   GLUCOSE 118*  --  101* 93 80 89  BUN 40*  --  22 16 14 18   CREATININE 1.03*  --  0.85 0.76 0.75 0.87  CALCIUM 9.1  --  8.8* 8.9 8.7* 9.1  MG  --  3.1*  --   --  2.0  --      Lab Results  Component Value Date   HGBA1C  09/09/2009    5.9 (NOTE) The ADA recommends the following therapeutic goal for glycemic control related to Hgb A1c measurement: Goal of therapy: <6.5 Hgb A1c  Reference: American Diabetes Association: Clinical Practice Recommendations 2010, Diabetes Care, 2010, 33: (Suppl  1).    Micro  Results No results found for this or any previous visit (from the past 240 hour(s)).  Radiology Reports Dg Chest Port 1 View  Result Date: 09/26/2018 CLINICAL DATA:  Followup pneumonia/ARDS. EXAM: PORTABLE CHEST 1 VIEW COMPARISON:  09/25/2018 and earlier. FINDINGS: Cardiac silhouette normal in size, unchanged. Interstitial and minimal ground-glass airspace opacity in the mid and lower lung zones, LEFT greater than RIGHT, unchanged since yesterday but minimally improved since the chest x-ray on 09/21/2018. No new pulmonary parenchymal abnormalities. Calcifications involving the pseudocapsules of the BILATERAL breast implants again noted. IMPRESSION: 1. Stable mild changes of pneumonia/ARDS since yesterday, improved since 09/21/2018. 2. No new abnormalities. Electronically Signed   By: Kayren Eaves.D.  On: 09/26/2018 15:29   Dg Chest Port 1 View  Result Date: 09/25/2018 CLINICAL DATA:  63 year old female with COVID-19, respiratory failure. EXAM: PORTABLE CHEST 1 VIEW COMPARISON:  09/21/2018 and earlier. FINDINGS: Portable AP upright view at 0407 hours. Peripheral and basilar predominant bilateral coarse pulmonary interstitial opacity persists. Lung volumes and ventilation are stable. No pneumothorax or pleural effusion. Normal cardiac size and mediastinal contours. Visualized tracheal air column is within normal limits. Incidental calcified breast implants. Prior ACDF. Negative visible bowel gas pattern. IMPRESSION: Stable ventilation since 09/21/2018 with basilar predominant pulmonary interstitial opacity compatible with COVID-19 pneumonia. Electronically Signed   By: Odessa FlemingH  Hall M.D.   On: 09/25/2018 06:56   Dg Chest Port 1 View  Result Date: 09/21/2018 CLINICAL DATA:  63 year old female with a history of dyspnea EXAM: PORTABLE CHEST 1 VIEW COMPARISON:  None. FINDINGS: Cardiac diameter borderline enlarged, with left rotation. Reticular pattern of opacities. No large pleural effusion or pneumothorax.  Pleuroparenchymal thickening at the apices. Calcified breast implants partially obscure the bases of the lungs. Surgical changes of cervical region. IMPRESSION: Reticular pattern of opacity of the bilateral lung bases, could represent chronic fibrosis/scarring versus atypical infection or bronchitis. Electronically Signed   By: Gilmer MorJaime  Wagner D.O.   On: 09/21/2018 10:48   Dg Chest Port 1 View  Result Date: 09/17/2018 CLINICAL DATA:  Shortness of breath EXAM: PORTABLE CHEST 1 VIEW COMPARISON:  Chest radiograph and chest CT Nov 01, 2014 FINDINGS: There is focal airspace opacity in the left base. There is slight right base atelectasis. The lungs elsewhere are clear. Heart size and pulmonary vascularity are normal. No adenopathy. There is aortic atherosclerosis. There are calcified breast implants bilaterally. There is postoperative change in the lower cervical spine. IMPRESSION: Focal airspace opacity in the left base, consistent with pneumonia. Mild right base atelectasis. Stable cardiac silhouette. Calcified breast implants noted. Aortic Atherosclerosis (ICD10-I70.0). Followup PA and lateral chest radiographs recommended in 3-4 weeks following trial of antibiotic therapy to ensure resolution and exclude underlying malignancy. Electronically Signed   By: Bretta BangWilliam  Woodruff III M.D.   On: 09/17/2018 16:15      Osvaldo ShipperGokul Paco Cislo M.D on 09/30/2018 at 10:58 AM  To page go to [email or web address] - password [email or web address]  [facility] -  Office  [phone]

## 2018-09-30 NOTE — Progress Notes (Signed)
Patient has requested better food. The food from dining is very heavy and hard on her stomach. I called service response and asked them to bring a [place] sandwich and a fruit cup and they said they would. I let the daughter know and told her I would follow up to make sure she ate dinner tonight.

## 2018-09-30 NOTE — Progress Notes (Signed)
Spoke with [name], the patient's daughter, and told her we got the food we asked for from dining services and the patient finally ate a full meal.

## 2018-10-01 LAB — BASIC METABOLIC PANEL
Anion gap: 10 (ref 5–15)
BUN: 21 mg/dL (ref 8–23)
CO2: 32 mmol/L (ref 22–32)
Calcium: 9.2 mg/dL (ref 8.9–10.3)
Chloride: 96 mmol/L — ABNORMAL LOW (ref 98–111)
Creatinine, Ser: 0.83 mg/dL (ref 0.44–1.00)
GFR calc Af Amer: >60 mL/min (ref >60)
GFR calc non Af Amer: >60 mL/min (ref >60)
Glucose, Bld: 95 mg/dL (ref 70–99)
Potassium: 3.4 mmol/L — ABNORMAL LOW (ref 3.5–5.1)
Sodium: 138 mmol/L (ref 135–145)

## 2018-10-01 LAB — CBC WITH DIFFERENTIAL/PLATELET
Abs Immature Granulocytes: 0.31 10*3/uL — ABNORMAL HIGH (ref 0.00–0.07)
Basophils Absolute: 0.1 10*3/uL (ref 0.0–0.1)
Basophils Relative: 1 %
Eosinophils Absolute: 0.2 10*3/uL (ref 0.0–0.5)
Eosinophils Relative: 1 %
HCT: 38.8 % (ref 36.0–46.0)
Hemoglobin: 12.4 g/dL (ref 12.0–15.0)
Immature Granulocytes: 3 %
Lymphocytes Relative: 14 %
Lymphs Abs: 1.8 10*3/uL (ref 0.7–4.0)
MCH: 32.2 pg (ref 26.0–34.0)
MCHC: 32 g/dL (ref 30.0–36.0)
MCV: 100.8 fL — ABNORMAL HIGH (ref 80.0–100.0)
Monocytes Absolute: 1.1 10*3/uL — ABNORMAL HIGH (ref 0.1–1.0)
Monocytes Relative: 9 %
Neutro Abs: 9 10*3/uL — ABNORMAL HIGH (ref 1.7–7.7)
Neutrophils Relative %: 72 %
Platelets: 215 10*3/uL (ref 150–400)
RBC: 3.85 MIL/uL — ABNORMAL LOW (ref 3.87–5.11)
RDW: 13.4 % (ref 11.5–15.5)
WBC: 12.4 10*3/uL — ABNORMAL HIGH (ref 4.0–10.5)
nRBC: 0 % (ref 0.0–0.2)

## 2018-10-01 LAB — MAGNESIUM: Magnesium: 2 mg/dL (ref 1.7–2.4)

## 2018-10-01 MED ORDER — POTASSIUM CHLORIDE CRYS ER 20 MEQ PO TBCR
40.0000 meq | EXTENDED_RELEASE_TABLET | Freq: Once | ORAL | Status: AC
  Administered 2018-10-01: 40 meq via ORAL
  Filled 2018-10-01: qty 2

## 2018-10-01 MED ORDER — ALBUTEROL SULFATE HFA 108 (90 BASE) MCG/ACT IN AERS: 2.0000 | INHALATION_SPRAY | RESPIRATORY_TRACT | 0 refills | Status: DC | PRN

## 2018-10-01 MED ORDER — ALBUTEROL SULFATE HFA 108 (90 BASE) MCG/ACT IN AERS
2.0000 | INHALATION_SPRAY | RESPIRATORY_TRACT | Status: DC | PRN
  Filled 2018-10-01: qty 6.7

## 2018-10-01 MED ORDER — ADULT MULTIVITAMIN W/MINERALS CH: 1.0000 | ORAL_TABLET | Freq: Every day | ORAL | 0 refills | Status: DC

## 2018-10-01 MED ORDER — VITAMIN C 500 MG PO TABS: 500.0000 mg | ORAL_TABLET | Freq: Two times a day (BID) | ORAL | 0 refills | Status: AC

## 2018-10-01 MED ORDER — PREDNISONE 5 MG PO TABS: ORAL_TABLET | ORAL | 0 refills | Status: DC

## 2018-10-01 NOTE — Progress Notes (Signed)
Patient called with c/o SOB, sats in the high 90s with RR in the 40's on 1 L 02, titrated to 2L. MD called, albuterol inhaler order and given. Patient verbalized improvement, 02 sats 100% and RR in the 20s.

## 2018-10-01 NOTE — Care Management (Signed)
Case manager contacted Licensed conveyancer and asked her to update bedside RN, patient's oxygen tanks are at home and she is ok to discharge. CM received call from Durward Fortes with [facility] at 3:00pm.   Charlyn Minerva Case Manager [phone]

## 2018-10-01 NOTE — Discharge Instructions (Signed)
Person Under Monitoring Name: Becky Boyd  Location: [address] [place] [address]   Infection Prevention Recommendations for Individuals Confirmed to have, or Being Evaluated for, 2019 Novel Coronavirus (COVID-19) Infection Who Receive Care at Home  Individuals who are confirmed to have, or are being evaluated for, COVID-19 should follow the prevention steps below until a healthcare provider or local or state health department says they can return to normal activities.  Stay home except to get medical care You should restrict activities outside your home, except for getting medical care. Do not go to work, school, or public areas, and do not use public transportation or taxis.  Call ahead before visiting your doctor Before your medical appointment, call the healthcare provider and tell them that you have, or are being evaluated for, COVID-19 infection. This will help the healthcare providers office take steps to keep other people from getting infected. Ask your healthcare provider to call the local or state health department.  Monitor your symptoms Seek prompt medical attention if your illness is worsening (e.g., difficulty breathing). Before going to your medical appointment, call the healthcare provider and tell them that you have, or are being evaluated for, COVID-19 infection. Ask your healthcare provider to call the local or state health department.  Wear a facemask You should wear a facemask that covers your nose and mouth when you are in the same room with other people and when you visit a healthcare provider. People who live with or visit you should also wear a facemask while they are in the same room with you.  Separate yourself from other people in your home As much as possible, you should stay in a different room from other people in your home. Also, you should use a separate bathroom, if available.  Avoid sharing household items You should not share dishes,  drinking glasses, cups, eating utensils, towels, bedding, or other items with other people in your home. After using these items, you should wash them thoroughly with soap and water.  Cover your coughs and sneezes Cover your mouth and nose with a tissue when you cough or sneeze, or you can cough or sneeze into your sleeve. Throw used tissues in a lined trash can, and immediately wash your hands with soap and water for at least 20 seconds or use an alcohol-based hand rub.  Wash your [employer] your hands often and thoroughly with soap and water for at least 20 seconds. You can use an alcohol-based hand sanitizer if soap and water are not available and if your hands are not visibly dirty. Avoid touching your eyes, nose, and mouth with unwashed hands.   Prevention Steps for Caregivers and Household Members of Individuals Confirmed to have, or Being Evaluated for, COVID-19 Infection Being Cared for in the Home  If you live with, or provide care at home for, a person confirmed to have, or being evaluated for, COVID-19 infection please follow these guidelines to prevent infection:  Follow healthcare providers instructions Make sure that you understand and can help the patient follow any healthcare provider instructions for all care.  Provide for the patients basic needs You should help the patient with basic needs in the home and provide support for getting groceries, prescriptions, and other personal needs.  Monitor the patients symptoms If they are getting sicker, call his or her medical provider and tell them that the patient has, or is being evaluated for, COVID-19 infection. This will help the healthcare providers office take  steps to keep other people from getting infected. Ask the healthcare provider to call the local or state health department.  Limit the number of people who have contact with the patient  If possible, have only one caregiver for the patient.  Other  household members should stay in another home or place of residence. If this is not possible, they should stay  in another room, or be separated from the patient as much as possible. Use a separate bathroom, if available.  Restrict visitors who do not have an essential need to be in the home.  Keep older adults, very young children, and other sick people away from the patient Keep older adults, very young children, and those who have compromised immune systems or chronic health conditions away from the patient. This includes people with chronic heart, lung, or kidney conditions, diabetes, and cancer.  Ensure good ventilation Make sure that shared spaces in the home have good air flow, such as from an air conditioner or an opened window, weather permitting.  Wash your hands often  Wash your hands often and thoroughly with soap and water for at least 20 seconds. You can use an alcohol based hand sanitizer if soap and water are not available and if your hands are not visibly dirty.  Avoid touching your eyes, nose, and mouth with unwashed hands.  Use disposable paper towels to dry your hands. If not available, use dedicated cloth towels and replace them when they become wet.  Wear a facemask and gloves  Wear a disposable facemask at all times in the room and gloves when you touch or have contact with the patients blood, body fluids, and/or secretions or excretions, such as sweat, saliva, sputum, nasal mucus, vomit, urine, or feces.  Ensure the mask fits over your nose and mouth tightly, and do not touch it during use.  Throw out disposable facemasks and gloves after using them. Do not reuse.  Wash your hands immediately after removing your facemask and gloves.  If your personal clothing becomes contaminated, carefully remove clothing and launder. Wash your hands after handling contaminated clothing.  Place all used disposable facemasks, gloves, and other waste in a lined container before  disposing them with other household waste.  Remove gloves and wash your hands immediately after handling these items.  Do not share dishes, glasses, or other household items with the patient  Avoid sharing household items. You should not share dishes, drinking glasses, cups, eating utensils, towels, bedding, or other items with a patient who is confirmed to have, or being evaluated for, COVID-19 infection.  After the person uses these items, you should wash them thoroughly with soap and water.  Wash laundry thoroughly  Immediately remove and wash clothes or bedding that have blood, body fluids, and/or secretions or excretions, such as sweat, saliva, sputum, nasal mucus, vomit, urine, or feces, on them.  Wear gloves when handling laundry from the patient.  Read and follow directions on labels of laundry or clothing items and detergent. In general, wash and dry with the warmest temperatures recommended on the label.  Clean all areas the individual has used often  Clean all touchable surfaces, such as counters, tabletops, doorknobs, bathroom fixtures, toilets, phones, keyboards, tablets, and bedside tables, every day. Also, clean any surfaces that may have blood, body fluids, and/or secretions or excretions on them.  Wear gloves when cleaning surfaces the patient has come in contact with.  Use a diluted bleach solution (e.g., dilute bleach with 1 part bleach  and 10 parts water) or a household disinfectant with a label that says EPA-registered for coronaviruses. To make a bleach solution at home, add 1 tablespoon of bleach to 1 quart (4 cups) of water. For a larger supply, add  cup of bleach to 1 gallon (16 cups) of water.  Read labels of cleaning products and follow recommendations provided on product labels. Labels contain instructions for safe and effective use of the cleaning product including precautions you should take when applying the product, such as wearing gloves or eye protection  and making sure you have good ventilation during use of the product.  Remove gloves and wash hands immediately after cleaning.  Monitor yourself for signs and symptoms of illness Caregivers and household members are considered close contacts, should monitor their health, and will be asked to limit movement outside of the home to the extent possible. Follow the monitoring steps for close contacts listed on the symptom monitoring form.   ? If you have additional questions, contact your local health department or call the epidemiologist on call at [phone] (available 24/7). ? This guidance is subject to change. For the most up-to-date guidance from [facility], please refer to their website: YouBlogs.pl

## 2018-10-01 NOTE — Progress Notes (Signed)
SATURATION QUALIFICATIONS: (This note is used to comply with regulatory documentation for home oxygen)  Patient Saturations on Room Air at Rest =95%  Patient Saturations on Room Air while Ambulating = 87%  Patient Saturations on2 L Liters of oxygen while Ambulating = 92% HR 115  Please briefly explain why patient needs home oxygen:To maintain Oxygen saturation  > 90%  Blanchard Kelch PT Acute Rehabilitation Services Pager [phone] Office [phone]

## 2018-10-01 NOTE — Discharge Summary (Signed)
[facility]  Physician Discharge Summary   Patient ID: Becky Boyd MRN: [id number] DOB/AGE: Dec 25, 1955 63 y.o.  Admit date: 09/17/2018 Discharge date: 10/01/2018  PCP: Sheliah Hatch, MD  DISCHARGE DIAGNOSES:  Acute respiratory failure with hypoxia secondary to COVID-19 Pneumonia secondary to COVID-19 Chronic diastolic CHF History of rheumatoid arthritis and Sjogren's syndrome With thyroidism Elevated LFTs  RECOMMENDATIONS FOR OUTPATIENT FOLLOW UP: 1. Patient discharged with home health and home oxygen 2. She would benefit from calls from the primary care physician office to check on her.    Home Health: PT/OT Equipment/Devices: 3 n 1.  Home oxygen  CODE STATUS: Full code  DISCHARGE CONDITION: fair  Diet recommendation: As before  INITIAL HISTORY: 63 year old female PMHx rheumatoid arthritis on chronic immunosuppressive therapy (prednisone+ SulfaSalazine), Sjogren, Hashimoto thyroiditis, fibromyalgia   Admitted to the hospital on 09/17/2018 with fever, cough, nausea diarrhea and fatigue that started about 8 days prior to admission. Her husband was hospitalized at [facility] diagnosed with COVID-19. In our ED she was positive for coronavirus, chest x-ray was concerning for leftlunginfiltrate, she had a fever of 102 and developed hypoxic respiratory failure requiring oxygen supplementation.  Approximate date Covid symptoms started: 09/09/2018. Patient has already finished a course of antibiotics with ceftriaxone and azithromycin on 4/17.  She see if 2 doses of Actemra along with IV steroids and was monitored in ICU for several days, now clinically improved.  She was titrated down to nasal cannula oxygen and transferred to [facility] service on 09/28/2018.  Consultations:  Pulmonology   HOSPITAL COURSE:    Acute Hypoxic Resp. Failure due to Acute Covid 19 Viral Illness during the ongoing 2020 Covid 19 Pandemic Required monitoring in ICU with high flow  nasal cannula oxygen, also had some evidence of bacterial pneumonia.   Patient completed course of antibiotics. She also received 2 doses of Actemra on 4/18 and 4/20 respectively.   She was also placed on steroids which will be tapered down slowly.  Oxygen requirements have been trending down slowly.  She is currently on 2 L of oxygen by nasal cannula.  She will be discharged on home oxygen.  She was seen by physical and Occupational Therapy.  Home health will be ordered.  She was also given Lasix during this hospitalization.  Her husband is also positive for COVID-19 and is currently recuperating at home.  Chronic diastolic CHF with mild acute decompensation in ICU Patient was diuresed during this hospital stay.  Stable currently.  Last echocardiogram 08/30/2014 grade 1 diastolic dysfunction normal EF of around 60% - 65%.   Will not be discharged on any diuretics at this time.  Rheumatoid arthritis/Sjogren's Syndrome Continue sulfasalazine.  Prednisone taper back to her home dose. Please note patient was on 5 mg of prednisone chronically.  Hypothyroidism Continue levothyroxine.  Elevated LFTs This was mild elevation.  Subsequently normal.    Chronic narcotic use Had developed narcotic induced toxic encephalopathy in the ICU.  Currently resolved.   Overall stable.  Patient feels much better this morning.  She ambulated with physical therapy.  Okay for discharge home today.  Discussed with patient as well as her daughter.    PERTINENT LABS:  The results of significant diagnostics from this hospitalization (including imaging, microbiology, ancillary and laboratory) are listed below for reference.     Labs: Basic Metabolic Panel: Recent Labs  Lab 09/26/18 1218 09/27/18 0205 09/28/18 0535 09/29/18 0450 09/30/18 0500 10/01/18 0517  NA  --  139 138 136 136 138  K  --  3.9 4.4 3.8 3.7 3.4*  CL  --  101 103 100 97* 96*  CO2  --  32  GLUCOSE  --  101* 93 80 89 95  BUN   --  CREATININE  --  0.85 0.76 0.75 0.87 0.83  CALCIUM  --  8.8* 8.9 8.7* 9.1 9.2  MG 3.1*  --   --  2.0  --  2.0   CBC: Recent Labs  Lab 09/27/18 0205 09/28/18 0535 09/29/18 0450 09/30/18 0500 10/01/18 0517  WBC 9.7 12.0* 11.0* 11.9* 12.4*  NEUTROABS 7.1 9.2* 8.2* 9.0* 9.0*  HGB 12.6 12.4 11.6* 12.2 12.4  HCT 40.5 39.3 36.0 39.2 38.8  MCV 100.0 101.8* 101.1* 99.7 100.8*  PLT 309 229 196 233 215    CBG: Recent Labs  Lab 09/25/18 0742 09/30/18 0726 09/30/18 1219  GLUCAP 133* 100* 143*     IMAGING STUDIES Dg Chest Port 1 View  Result Date: 09/26/2018 CLINICAL DATA:  Followup pneumonia/ARDS. EXAM: PORTABLE CHEST 1 VIEW COMPARISON:  09/25/2018 and earlier. FINDINGS: Cardiac silhouette normal in size, unchanged. Interstitial and minimal ground-glass airspace opacity in the mid and lower lung zones, LEFT greater than RIGHT, unchanged since yesterday but minimally improved since the chest x-ray on 09/21/2018. No new pulmonary parenchymal abnormalities. Calcifications involving the pseudocapsules of the BILATERAL breast implants again noted. IMPRESSION: 1. Stable mild changes of pneumonia/ARDS since yesterday, improved since 09/21/2018. 2. No new abnormalities. Electronically Signed   By: Hulan Saas M.D.   On: 09/26/2018 15:29   Dg Chest Port 1 View  Result Date: 09/25/2018 CLINICAL DATA:  63 year old female with COVID-19, respiratory failure. EXAM: PORTABLE CHEST 1 VIEW COMPARISON:  09/21/2018 and earlier. FINDINGS: Portable AP upright view at 0407 hours. Peripheral and basilar predominant bilateral coarse pulmonary interstitial opacity persists. Lung volumes and ventilation are stable. No pneumothorax or pleural effusion. Normal cardiac size and mediastinal contours. Visualized tracheal air column is within normal limits. Incidental calcified breast implants. Prior ACDF. Negative visible bowel gas pattern. IMPRESSION: Stable ventilation since 09/21/2018 with  basilar predominant pulmonary interstitial opacity compatible with COVID-19 pneumonia. Electronically Signed   By: Odessa Fleming M.D.   On: 09/25/2018 06:56   Dg Chest Port 1 View  Result Date: 09/21/2018 CLINICAL DATA:  63 year old female with a history of dyspnea EXAM: PORTABLE CHEST 1 VIEW COMPARISON:  None. FINDINGS: Cardiac diameter borderline enlarged, with left rotation. Reticular pattern of opacities. No large pleural effusion or pneumothorax. Pleuroparenchymal thickening at the apices. Calcified breast implants partially obscure the bases of the lungs. Surgical changes of cervical region. IMPRESSION: Reticular pattern of opacity of the bilateral lung bases, could represent chronic fibrosis/scarring versus atypical infection or bronchitis. Electronically Signed   By: Gilmer Mor D.O.   On: 09/21/2018 10:48   Dg Chest Port 1 View  Result Date: 09/17/2018 CLINICAL DATA:  Shortness of breath EXAM: PORTABLE CHEST 1 VIEW COMPARISON:  Chest radiograph and chest CT Nov 01, 2014 FINDINGS: There is focal airspace opacity in the left base. There is slight right base atelectasis. The lungs elsewhere are clear. Heart size and pulmonary vascularity are normal. No adenopathy. There is aortic atherosclerosis. There are calcified breast implants bilaterally. There is postoperative change in the lower cervical spine. IMPRESSION: Focal airspace opacity in the left base, consistent with pneumonia. Mild right base atelectasis. Stable cardiac silhouette. Calcified breast implants noted. Aortic Atherosclerosis (ICD10-I70.0). Followup PA and lateral chest radiographs recommended in 3-4 weeks  following trial of antibiotic therapy to ensure resolution and exclude underlying malignancy. Electronically Signed   By: Bretta Bang III M.D.   On: 09/17/2018 16:15    DISCHARGE EXAMINATION: Vitals:   10/01/18 0438 10/01/18 0800 10/01/18 1139 10/01/18 1309  BP: 106/73 116/75 112/73 112/68  Pulse: 81 78 97   Resp: 17 20 (!)  25   Temp: 98.4 F (36.9 C) 97.7 F (36.5 C) 97.8 F (36.6 C)   TempSrc: Oral Oral Oral   SpO2: 100% 96% 98%   Weight: 63.2 kg     Height:       General appearance: Awake alert.  In no distress Resp: Improved air entry bilaterally.  Normal effort at rest.  Coarse breath sounds.  No wheezing or rhonchi. Cardio: S1-S2 is normal regular.  No S3-S4.  No rubs murmurs or bruit GI: Abdomen is soft.  Nontender nondistended.  Bowel sounds are present normal.  No masses organomegaly Extremities: No edema.  Full range of motion of lower extremities. Neurologic: Alert and oriented x3.  No focal neurological deficits.    DISPOSITION: Home with home health  Discharge Instructions    Call MD for:  difficulty breathing, headache or visual disturbances   Complete by:  As directed    Call MD for:  extreme fatigue   Complete by:  As directed    Call MD for:  persistant dizziness or light-headedness   Complete by:  As directed    Call MD for:  persistant nausea and vomiting   Complete by:  As directed    Call MD for:  severe uncontrolled pain   Complete by:  As directed    Call MD for:  temperature >100.4   Complete by:  As directed    Diet general   Complete by:  As directed    Discharge instructions   Complete by:  As directed    You are being discharged from the hospital after treatment for covid-19 infection. You are being discharged based on the standards of national guidelines (body temperature remaining normal for 3 days, symptoms significantly improved; obvious absorption of inflammation on lung images). - You are felt to be stable enough to no longer require inpatient monitoring, testing, and treatment, though you will need to follow the recommendations below: - Based on the CDC's non-test criteria for ending self-isolation: You may not return to work/leave the home until at least 7 days since symptom onset AND 3 days without a fever (without taking tylenol, ibuprofen, etc.) AND have  improvement in respiratory symptoms. - Do not take NSAID medications (including, but not limited to, ibuprofen, advil, motrin, naproxen, aleve, goody's powder, etc.) - Follow up with your doctor in the next week via telehealth or seek medical attention right away if your symptoms get WORSE.  - Consider donating plasma after you have recovered (either 14 days after a negative test or 28 days after symptoms have completely resolved) because your antibodies to this virus may be helpful to give to others with life-threatening infections. Please go to the website [email or web address] if you would like to consider volunteering for plasma donation.    Directions for you at home:  Wear a facemask You should wear a facemask that covers your nose and mouth when you are in the same room with other people and when you visit a healthcare provider. People who live with or visit you should also wear a facemask while they are in the same room with you.  Separate yourself from  other people in your home As much as possible, you should stay in a different room from other people in your home. Also, you should use a separate bathroom, if available.  Avoid sharing household items You should not share dishes, drinking glasses, cups, eating utensils, towels, bedding, or other items with other people in your home. After using these items, you should wash them thoroughly with soap and water.  Cover your coughs and sneezes Cover your mouth and nose with a tissue when you cough or sneeze, or you can cough or sneeze into your sleeve. Throw used tissues in a lined trash can, and immediately wash your hands with soap and water for at least 20 seconds or use an alcohol-based hand rub.  Wash your Union Pacific Corporationhands Wash your hands often and thoroughly with soap and water for at least 20 seconds. You can use an alcohol-based hand sanitizer if soap and water are not available and if your hands are not visibly dirty. Avoid touching your  eyes, nose, and mouth with unwashed hands.  Directions for those who live with, or provide care at home for you:  Limit the number of people who have contact with the patient If possible, have only one caregiver for the patient. Other household members should stay in another home or place of residence. If this is not possible, they should stay in another room, or be separated from the patient as much as possible. Use a separate bathroom, if available. Restrict visitors who do not have an essential need to be in the home.  Ensure good ventilation Make sure that shared spaces in the home have good air flow, such as from an air conditioner or an opened window, weather permitting.  Wash your hands often Wash your hands often and thoroughly with soap and water for at least 20 seconds. You can use an alcohol based hand sanitizer if soap and water are not available and if your hands are not visibly dirty. Avoid touching your eyes, nose, and mouth with unwashed hands. Use disposable paper towels to dry your hands. If not available, use dedicated cloth towels and replace them when they become wet.  Wear a facemask and gloves Wear a disposable facemask at all times in the room and gloves when you touch or have contact with the patients blood, body fluids, and/or secretions or excretions, such as sweat, saliva, sputum, nasal mucus, vomit, urine, or feces.  Ensure the mask fits over your nose and mouth tightly, and do not touch it during use. Throw out disposable facemasks and gloves after using them. Do not reuse. Wash your hands immediately after removing your facemask and gloves. If your personal clothing becomes contaminated, carefully remove clothing and launder. Wash your hands after handling contaminated clothing. Place all used disposable facemasks, gloves, and other waste in a lined container before disposing them with other household waste. Remove gloves and wash your hands immediately after  handling these items.  Do not share dishes, glasses, or other household items with the patient Avoid sharing household items. You should not share dishes, drinking glasses, cups, eating utensils, towels, bedding, or other items with a patient who is confirmed to have, or being evaluated for, COVID-19 infection. After the person uses these items, you should wash them thoroughly with soap and water.  Wash laundry thoroughly Immediately remove and wash clothes or bedding that have blood, body fluids, and/or secretions or excretions, such as sweat, saliva, sputum, nasal mucus, vomit, urine, or feces, on them. Wear gloves  when handling laundry from the patient. Read and follow directions on labels of laundry or clothing items and detergent. In general, wash and dry with the warmest temperatures recommended on the label.  Clean all areas the individual has used often Clean all touchable surfaces, such as counters, tabletops, doorknobs, bathroom fixtures, toilets, phones, keyboards, tablets, and bedside tables, every day. Also, clean any surfaces that may have blood, body fluids, and/or secretions or excretions on them. Wear gloves when cleaning surfaces the patient has come in contact with. Use a diluted bleach solution (e.g., dilute bleach with 1 part bleach and 10 parts water) or a household disinfectant with a label that says EPA-registered for coronaviruses. To make a bleach solution at home, add 1 tablespoon of bleach to 1 quart (4 cups) of water. For a larger supply, add  cup of bleach to 1 gallon (16 cups) of water. Read labels of cleaning products and follow recommendations provided on product labels. Labels contain instructions for safe and effective use of the cleaning product including precautions you should take when applying the product, such as wearing gloves or eye protection and making sure you have good ventilation during use of the product. Remove gloves and wash hands immediately after  cleaning.  Monitor yourself for signs and symptoms of illness Caregivers and household members are considered close contacts, should monitor their health, and will be asked to limit movement outside of the home to the extent possible. Follow the monitoring steps for close contacts listed on the symptom monitoring form.   If you have additional questions, contact your local health department or call the epidemiologist on call at [phone] (available 24/7). This guidance is subject to change. For the most up-to-date guidance from [facility], please refer to their website: TripMetro.huhttps://www.cdc.gov/coronavirus/2019-ncov/hcp/guidance-prevent-spread.html   You were cared for by a hospitalist during your hospital stay. If you have any questions about your discharge medications or the care you received while you were in the hospital after you are discharged, you can call the unit and asked to speak with the hospitalist on call if the hospitalist that took care of you is not available. Once you are discharged, your primary care physician will handle any further medical issues. Please note that NO REFILLS for any discharge medications will be authorized once you are discharged, as it is imperative that you return to your primary care physician (or establish a relationship with a primary care physician if you do not have one) for your aftercare needs so that they can reassess your need for medications and monitor your lab values. If you do not have a primary care physician, you can call [phone] for a physician referral.   Increase activity slowly   Complete by:  As directed    MyChart COVID-19 home monitoring program   Complete by:  Oct 01, 2018    Is the patient willing to use the MyChart Mobile App for home monitoring?:  Yes   Temperature monitoring   Complete by:  Oct 01, 2018    After how many days would you like to receive a notification of this patient's flowsheet entries?:  1        Allergies as of  10/01/2018      Reactions   Plaquenil [hydroxychloroquine Sulfate] Other (See Comments)   Prolonged QT interval, skin feels like bee stings      Medication List    TAKE these medications   acetaminophen 325 MG tablet Commonly known as:  TYLENOL Take 650 mg by  mouth every 6 (six) hours as needed for fever or headache.   albuterol 108 (90 Base) MCG/ACT inhaler Commonly known as:  VENTOLIN HFA Inhale 2 puffs into the lungs every 4 (four) hours as needed for wheezing or shortness of breath.   atorvastatin 20 MG tablet Commonly known as:  LIPITOR TAKE ONE TABLET BY MOUTH ONE TIME DAILY   baclofen 10 MG tablet Commonly known as:  LIORESAL take 1 tablet by mouth 3 times daily   DULoxetine 60 MG capsule Commonly known as:  CYMBALTA Take 60 mg by mouth daily.   HYDROcodone-acetaminophen 5-325 MG tablet Commonly known as:  NORCO/VICODIN Take 1-2 tablets by mouth every 6 (six) hours as needed.   levothyroxine 75 MCG tablet Commonly known as:  SYNTHROID Take 75 mcg by mouth daily.   meloxicam 15 MG tablet Commonly known as:  MOBIC Take 15 mg by mouth daily as needed for pain (takes daily).   multivitamin with minerals Tabs tablet Take 1 tablet by mouth daily.   predniSONE 5 MG tablet Commonly known as:  DELTASONE Take 3 tablets once daily for 3 days, then take 2 tablets once daily for 3 days, then 1 tablet daily as before. What changed:    how much to take  how to take this  when to take this  additional instructions   sulfaSALAzine 500 MG tablet Commonly known as:  AZULFIDINE 1,000 mg 2 (two) times daily.   vitamin C 500 MG tablet Commonly known as:  ASCORBIC ACID Take 1 tablet (500 mg total) by mouth 2 (two) times daily for 10 days.            Durable Medical Equipment  (From admission, onward)         Start     Ordered   10/01/18 1238  For home use only DME Shower stool  Once     10/01/18 1238   10/01/18 1230  For home use only DME oxygen  Once     Question Answer Comment  Mode or (Route) Nasal cannula   Liters per Minute 2   Frequency Continuous (stationary and portable oxygen unit needed)   Oxygen conserving device Yes   Oxygen delivery system Gas      10/01/18 1229           Follow-up Information    Sheliah Hatch, MD Follow up.   Specialty:  Family Medicine Why:  call over phone for updates. Contact information: [address] [place] [address] [phone]        [employer] Follow up.   Why:  Your oxygen and shower stool will be delivered to your home by [employer]. If you have questions or problems with oxygen tanks please call them. Contact information: [address] [place] [address] [phone]        [facility] Follow up.   Specialty:  Home Health Services Why:  A representative from [facility] will contact you to arrange start date and time for your therapy to begin.  Contact information: [address] [place] [address] [phone]           TOTAL DISCHARGE TIME: 35 minutes  Swathi Dauphin  [facility] Pager on [email or web address]  10/01/2018, 4:44 PM

## 2018-10-01 NOTE — Progress Notes (Signed)
Physical Therapy Treatment Patient Details Name: Becky Boyd MRN: [id number] DOB: 1956-01-13 Today's Date: 10/01/2018    History of Present Illness 63 year old female PMHx rheumatoid arthritis on chronic immunosuppressive therapy, Sjogren, Hashimoto thyroiditis, fibromyalgia;  Admitted to the hospital on 09/17/2018 with fever, cough, nausea diarrhea and fatigue that started about 8 days prior to admission.  Her husband was hospitalized at [facility] and diagnosed with COVID-19.  In our ED she was positive for coronavirus, chest x-ray was concerning for left lung infiltrate, she had a fever of 102 and developed hypoxic respiratory failure requiring oxygen supplementation.  Approximate date Covid symptoms started: 09/09/2018.     PT Comments    The patient is  Anxious to DC home today. Ambulated x 200'2 L [place] with stas  >95%. HR 115. Will perform sat qualification next visit.  Follow Up Recommendations  Home health PT;Supervision - Intermittent     Equipment Recommendations  None recommended by PT    Recommendations for Other Services       Precautions / Restrictions Precautions Precaution Comments: monitor VS    Mobility  Bed Mobility Overal bed mobility: Independent                Transfers Overall transfer level: Independent                  Ambulation/Gait Ambulation/Gait assistance: Min guard Gait Distance (Feet): 200 Feet Assistive device: None;1 person hand held assist       General Gait Details: one episode of faultering   Stairs             Wheelchair Mobility    Modified Rankin (Stroke Patients Only)       Balance                                            Cognition Arousal/Alertness: Awake/alert                                     General Comments: anxious to go home      Exercises      General Comments        Pertinent Vitals/Pain Pain Assessment: No/denies pain    Home  Living                      Prior Function            PT Goals (current goals can now be found in the care plan section) Progress towards PT goals: Progressing toward goals    Frequency    Min 4X/week      PT Plan Current plan remains appropriate    Co-evaluation              AM-PAC PT "6 Clicks" Mobility   Outcome Measure  Help needed turning from your back to your side while in a flat bed without using bedrails?: None Help needed moving from lying on your back to sitting on the side of a flat bed without using bedrails?: None Help needed moving to and from a bed to a chair (including a wheelchair)?: A Little Help needed standing up from a chair using your arms (e.g., wheelchair or bedside chair)?: A Little Help needed to walk in hospital room?: A Little Help needed  climbing 3-5 steps with a railing? : A Little 6 Click Score: 20    End of Session Equipment Utilized During Treatment: Oxygen Activity Tolerance: Patient tolerated treatment well Patient left: in bed Nurse Communication: Mobility status PT Visit Diagnosis: Unsteadiness on feet (R26.81)     Time: 8889-1694 PT Time Calculation (min) (ACUTE ONLY): 16 min  Charges:  $Gait Training: 8-22 mins                     Blanchard Kelch PT Acute Rehabilitation Services Pager [phone] Office [phone]    Rada Hay 10/01/2018, 1:23 PM

## 2018-10-01 NOTE — Progress Notes (Signed)
Physical Therapy Treatment Patient Details Name: Becky Boyd MRN: [id number] DOB: 1955/07/08 Today's Date: 10/01/2018    History of Present Illness 63 year old female PMHx rheumatoid arthritis on chronic immunosuppressive therapy, Sjogren, Hashimoto thyroiditis, fibromyalgia;  Admitted to the hospital on 09/17/2018 with fever, cough, nausea diarrhea and fatigue that started about 8 days prior to admission.  Her husband was hospitalized at [facility] and diagnosed with COVID-19.  In our ED she was positive for coronavirus, chest x-ray was concerning for left lung infiltrate, she had a fever of 102 and developed hypoxic respiratory failure requiring oxygen supplementation.  Approximate date Covid symptoms started: 09/09/2018.     PT Comments    Patient desaturates on RA. Plans Dc today .   Follow Up Recommendations  Home health PT;Supervision - Intermittent     Equipment Recommendations  None recommended by PT    Recommendations for Other Services       Precautions / Restrictions Precautions Precaution Comments: monitor VS    Mobility  Bed Mobility Overal bed mobility: Independent                Transfers Overall transfer level: Independent                  Ambulation/Gait Ambulation/Gait assistance: Min guard Gait Distance (Feet): 60 Feet Assistive device: None;1 person hand held assist Gait Pattern/deviations: Step-through pattern     General Gait Details: sats dropp to 87 % on RA, See qualification note.   Stairs             Wheelchair Mobility    Modified Rankin (Stroke Patients Only)       Balance                                            Cognition Arousal/Alertness: Awake/alert                                     General Comments: anxious to go home      Exercises      General Comments        Pertinent Vitals/Pain Pain Assessment: No/denies pain    Home Living                       Prior Function            PT Goals (current goals can now be found in the care plan section) Progress towards PT goals: Progressing toward goals    Frequency    Min 4X/week      PT Plan Current plan remains appropriate    Co-evaluation              AM-PAC PT "6 Clicks" Mobility   Outcome Measure  Help needed turning from your back to your side while in a flat bed without using bedrails?: None Help needed moving from lying on your back to sitting on the side of a flat bed without using bedrails?: None Help needed moving to and from a bed to a chair (including a wheelchair)?: A Little Help needed standing up from a chair using your arms (e.g., wheelchair or bedside chair)?: A Little Help needed to walk in hospital room?: A Little Help needed climbing 3-5 steps with a railing? : A Little 6 Click  Score: 20    End of Session Equipment Utilized During Treatment: Oxygen Activity Tolerance: Patient tolerated treatment well Patient left: in bed Nurse Communication: Mobility status PT Visit Diagnosis: Unsteadiness on feet (R26.81)     Time: 5520-8022 PT Time Calculation (min) (ACUTE ONLY): 16 min  Charges:  $Gait Training: 8-22 mins                     Blanchard Kelch PT Acute Rehabilitation Services Pager [phone] Office [phone]    Rada Hay 10/01/2018, 1:41 PM

## 2018-10-01 NOTE — TOC Transition Note (Signed)
Transition of Care [facility]) - CM/SW Discharge Note   Patient Details  Name: Becky Boyd MRN: [id number] Date of Birth: 1956-05-19  Transition [facility]) CM/SW Contact:  Durenda Guthrie, RN Phone Number: 10/01/2018, 1:05 PM   Clinical Narrative:   63 yr old female admitted with  COVID 19. Patient has improved and will discharge home with Home Health services and oxygen. Case manager discussed discharge plan with patient. Referral for Oxygen and DME called to Durward Fortes, [facility] Liaison. Oxygen concentrator and shower stool will be delivered to patient's home. CM confirmed that someone is home to accept equipment and to assist patient. Referral for Home Health services called to Lorenza Chick, Liaison with [facility]. Case manager contacted [facility] and saturation note, oxygen order and order to shower stool to [employer]: [phone]. CM will notify bedside RN when it has been confirmed that oxygen has reached patient's home. Patient will discharge home with family support.  Final next level of care: Home w Home Health Services Barriers to Discharge: No Barriers Identified   Patient Goals and CMS Choice    To get better   Choice offered to / list presented to : Patient  Discharge Placement                       Discharge Plan and Services   Discharge Planning Services: CM Consult Post Acute Care Choice: Durable Medical Equipment, Home Health          DME Arranged: Shower stool, Oxygen DME Agency: [facility] Date DME Agency Contacted: 10/01/18 Time DME Agency Contacted: 1243 Representative spoke with at DME Agency: Durward Fortes    [phone] [facility] Arranged: PT, OT HH Agency: [facility] Date [facility] Agency Contacted: 10/01/18 Time HH Agency Contacted: 1243 Representative spoke with at [facility] Agency: Lorenza Chick  [phone]  Social Determinants of Health (SDOH) Interventions     Readmission Risk Interventions No flowsheet data  found.

## 2018-10-02 ENCOUNTER — Telehealth: Payer: Self-pay

## 2018-10-02 NOTE — Telephone Encounter (Signed)
Transition Care Management Follow-up Telephone Call  Admit date: 09/17/2018 Discharge date: 10/01/2018 DISCHARGE DIAGNOSES: Acute respiratory failure with hypoxia secondary to COVID-19   How have you been since you were released from the hospital? "I'm feeling much better"   Do you understand why you were in the hospital? yes   Do you understand the discharge instructions? yes   Where were you discharged to? Home. Family with patient. Patient and spouse isolating themselves on different floors of home.    Items Reviewed:  Medications reviewed: yes. Oxygen 2L.   Allergies reviewed: yes  Dietary changes reviewed: yes  Referrals reviewed: yes. [facility] RN and PT for home visits.    Functional Questionnaire:   Activities of Daily Living (ADLs):   She states they are independent in the following: ambulation, bathing and hygiene, feeding, continence, grooming, toileting and dressing States they require assistance with the following: None.    Any transportation issues/concerns?: no   Any patient concerns? no   Confirmed importance and date/time of follow-up visits scheduled yes  Provider Appointment booked with PCP Monday, 10/05/2018 via virtual visit.   Confirmed with patient if condition begins to worsen call PCP or go to the ER.  Patient was given the office number and encouraged to call back with question or concerns.  : yes

## 2018-10-05 ENCOUNTER — Ambulatory Visit (INDEPENDENT_AMBULATORY_CARE_PROVIDER_SITE_OTHER): Payer: Medicare Other | Admitting: Family Medicine

## 2018-10-05 ENCOUNTER — Other Ambulatory Visit: Payer: Self-pay

## 2018-10-05 ENCOUNTER — Encounter: Payer: Self-pay | Admitting: Family Medicine

## 2018-10-05 VITALS — BP 115/72 | HR 88 | Ht 63.0 in | Wt 135.0 lb

## 2018-10-05 DIAGNOSIS — J9621 Acute and chronic respiratory failure with hypoxia: Secondary | ICD-10-CM

## 2018-10-05 DIAGNOSIS — U071 COVID-19: Secondary | ICD-10-CM | POA: Diagnosis not present

## 2018-10-05 NOTE — Progress Notes (Signed)
I have discussed the procedure for the virtual visit with the patient who has given consent to proceed with assessment and treatment.   BETHANY DILLARD, CMA     

## 2018-10-05 NOTE — Progress Notes (Signed)
Virtual Visit via Video   I connected with patient on 10/05/18 at  3:00 PM EDT by a video enabled telemedicine application and verified that I am speaking with the correct person using two identifiers.  Location patient: Home Location provider: [facility] [place], [place] Persons participating in the virtual visit: Patient, Provider, CMA [facility](Bethany Dillard)  I discussed the limitations of evaluation and management by telemedicine and the availability of in person appointments. The patient expressed understanding and agreed to proceed.  Subjective:   HPI:   Hospital F/U- pt was admitted w/ COVID19 on 4/16 and remained hospitalized until 4/30.  She had acute respiratory failure secondary to COVID and required high levels of O2 during her stay.  She was d/c'd home on 2L O2 via [place].  She was also d/c'd w/ home health PT/OT to work on rehabilitating her to pre-hospital state.  Pt reports feeling much better.  Continues to have dry cough.  Pt reports increased HR and leg weakness w/ ambulation.  'i'm not sleeping'- can fall asleep but will wake frequently.  Able to eat and drink w/o difficulty.  Reviewed admission note, progress notes, consult notes, labs, images, and d/c summary.  Reviewed past medical, surgical, family and social histories.   ROS:   See pertinent positives and negatives per HPI.  Patient Active Problem List   Diagnosis Date Noted  . Chronic diastolic CHF (congestive heart failure) (HCC) 09/23/2018  . Rheumatoid arthritis (HCC) 09/23/2018  . Acute on chronic respiratory failure with hypoxia (HCC) 09/19/2018  . Hypokalemia 09/19/2018  . Fever 09/17/2018  . COVID-19 virus infection 09/17/2018  . Hyperlipidemia 01/27/2017  . Bell's palsy 02/13/2016  . Maxillary sinusitis, acute 09/07/2015  . Hematuria 03/02/2015  . Left lumbar radiculopathy 11/09/2014  . Shortness of breath 11/09/2014  . Jaw pain 04/13/2014  . Pain in joint, shoulder region 10/21/2013  . Chest pain  08/31/2013  . Post-nasal drip 08/31/2013  . Abnormal brain MRI 06/17/2013  . Sjogren's syndrome (HCC) 06/17/2013  . Blurry vision 06/17/2013  . Back pain, lumbosacral 09/30/2012  . Trigger point with back pain 03/22/2011  . General medical examination 01/25/2011  . Memory loss 01/25/2011  . Joint pain 11/08/2010  . Fatigue 11/08/2010  . Hypothyroidism 05/02/2008  . HASHIMOTO'S THYROIDITIS 05/02/2008  . ANOREXIA 05/02/2008  . DYSPHAGIA UNSPECIFIED 05/02/2008  . ABDOMINAL PAIN 05/02/2008    Social History   Tobacco Use  . Smoking status: Never Smoker  . Smokeless tobacco: Never Used  Substance Use Topics  . Alcohol use: Never    Frequency: Never    Current Outpatient Medications:  .  acetaminophen (TYLENOL) 325 MG tablet, Take 650 mg by mouth every 6 (six) hours as needed for fever or headache., Disp: , Rfl:  .  albuterol (VENTOLIN HFA) 108 (90 Base) MCG/ACT inhaler, Inhale 2 puffs into the lungs every 4 (four) hours as needed for wheezing or shortness of breath., Disp: 1 Inhaler, Rfl: 0 .  atorvastatin (LIPITOR) 20 MG tablet, TAKE ONE TABLET BY MOUTH ONE TIME DAILY  (Patient taking differently: Take 20 mg by mouth daily. ), Disp: 90 tablet, Rfl: 0 .  baclofen (LIORESAL) 10 MG tablet, take 1 tablet by mouth 3 times daily, Disp: 90 tablet, Rfl: 2 .  DULoxetine (CYMBALTA) 60 MG capsule, Take 60 mg by mouth daily. , Disp: , Rfl:  .  HYDROcodone-acetaminophen (NORCO/VICODIN) 5-325 MG per tablet, Take 1-2 tablets by mouth every 6 (six) hours as needed., Disp: 20 tablet, Rfl: 0 .  levothyroxine (SYNTHROID, LEVOTHROID) 75 MCG tablet, Take 75 mcg by mouth daily.  , Disp: , Rfl:  .  meloxicam (MOBIC) 15 MG tablet, Take 15 mg by mouth daily as needed for pain (takes daily). , Disp: , Rfl:  .  Multiple Vitamin (MULTIVITAMIN WITH MINERALS) TABS tablet, Take 1 tablet by mouth daily., Disp: 30 tablet, Rfl: 0 .  OXYGEN, Inhale 2 L/min into the lungs., Disp: , Rfl:  .  predniSONE (DELTASONE) 5 MG  tablet, Take 3 tablets once daily for 3 days, then take 2 tablets once daily for 3 days, then 1 tablet daily as before., Disp: 30 tablet, Rfl: 0 .  vitamin C (ASCORBIC ACID) 500 MG tablet, Take 1 tablet (500 mg total) by mouth 2 (two) times daily for 10 days., Disp: 20 tablet, Rfl: 0 .  sulfaSALAzine (AZULFIDINE) 500 MG tablet, 1,000 mg 2 (two) times daily. , Disp: , Rfl:   Allergies  Allergen Reactions  . Plaquenil [Hydroxychloroquine Sulfate] Other (See Comments)    Prolonged QT interval, skin feels like bee stings    Objective:   BP 115/72   Pulse 88   Ht 5\' 3"  (1.6 m)   Wt 135 lb (61.2 kg)   SpO2 93%   BMI 23.91 kg/m   AAOx3, NAD NCAT, EOMI No obvious CN deficits O2 in place via [place] WNL Dry cough Pt is able to speak clearly, coherently without shortness of breath or increased work of breathing.  Thought process is linear.  Mood is appropriate.   Assessment and Plan:   Respiratory failure due to COVID- resolving.  Pt remains on 2.5L [place] w/ some dry cough and fatigue but is otherwise feeling well.  Is looking forward to getting 'back to normal'.  Is really looking good considering she was on the verge of intubation a week ago.  Encouraged her to give herself time to recover and not push too hard.  Will continue to follow.   Neena Rhymes, MD 10/05/2018

## 2018-10-06 DIAGNOSIS — U071 COVID-19: Secondary | ICD-10-CM | POA: Diagnosis not present

## 2018-10-06 DIAGNOSIS — J1289 Other viral pneumonia: Secondary | ICD-10-CM | POA: Diagnosis not present

## 2018-10-06 DIAGNOSIS — I5032 Chronic diastolic (congestive) heart failure: Secondary | ICD-10-CM | POA: Diagnosis not present

## 2018-10-06 DIAGNOSIS — I110 Hypertensive heart disease with heart failure: Secondary | ICD-10-CM | POA: Diagnosis not present

## 2018-10-06 DIAGNOSIS — J9601 Acute respiratory failure with hypoxia: Secondary | ICD-10-CM | POA: Diagnosis not present

## 2018-10-09 DIAGNOSIS — J1289 Other viral pneumonia: Secondary | ICD-10-CM | POA: Diagnosis not present

## 2018-10-09 DIAGNOSIS — I110 Hypertensive heart disease with heart failure: Secondary | ICD-10-CM | POA: Diagnosis not present

## 2018-10-09 DIAGNOSIS — U071 COVID-19: Secondary | ICD-10-CM | POA: Diagnosis not present

## 2018-10-09 DIAGNOSIS — J9601 Acute respiratory failure with hypoxia: Secondary | ICD-10-CM | POA: Diagnosis not present

## 2018-10-09 DIAGNOSIS — I5032 Chronic diastolic (congestive) heart failure: Secondary | ICD-10-CM | POA: Diagnosis not present

## 2018-10-13 ENCOUNTER — Encounter: Payer: Self-pay | Admitting: Family Medicine

## 2018-10-13 DIAGNOSIS — J9601 Acute respiratory failure with hypoxia: Secondary | ICD-10-CM | POA: Diagnosis not present

## 2018-10-13 DIAGNOSIS — U071 COVID-19: Secondary | ICD-10-CM | POA: Diagnosis not present

## 2018-10-13 DIAGNOSIS — I5032 Chronic diastolic (congestive) heart failure: Secondary | ICD-10-CM | POA: Diagnosis not present

## 2018-10-13 DIAGNOSIS — J1289 Other viral pneumonia: Secondary | ICD-10-CM | POA: Diagnosis not present

## 2018-10-13 DIAGNOSIS — I110 Hypertensive heart disease with heart failure: Secondary | ICD-10-CM | POA: Diagnosis not present

## 2018-10-13 NOTE — Telephone Encounter (Signed)
Fwding to Dr. Beverely Low

## 2018-10-16 DIAGNOSIS — J1289 Other viral pneumonia: Secondary | ICD-10-CM | POA: Diagnosis not present

## 2018-10-16 DIAGNOSIS — I110 Hypertensive heart disease with heart failure: Secondary | ICD-10-CM | POA: Diagnosis not present

## 2018-10-16 DIAGNOSIS — I5032 Chronic diastolic (congestive) heart failure: Secondary | ICD-10-CM | POA: Diagnosis not present

## 2018-10-16 DIAGNOSIS — U071 COVID-19: Secondary | ICD-10-CM | POA: Diagnosis not present

## 2018-10-16 DIAGNOSIS — J9601 Acute respiratory failure with hypoxia: Secondary | ICD-10-CM | POA: Diagnosis not present

## 2018-10-19 ENCOUNTER — Telehealth: Payer: Self-pay | Admitting: Family Medicine

## 2018-10-19 NOTE — Telephone Encounter (Signed)
Paperwork given to PCP for completion.  

## 2018-10-19 NOTE — Telephone Encounter (Signed)
I have placed a HH cert. And a plan of care in the bin upfront with a charge sheet.

## 2018-10-20 DIAGNOSIS — Z9882 Breast implant status: Secondary | ICD-10-CM

## 2018-10-20 DIAGNOSIS — Z9181 History of falling: Secondary | ICD-10-CM

## 2018-10-20 DIAGNOSIS — J9811 Atelectasis: Secondary | ICD-10-CM

## 2018-10-20 DIAGNOSIS — Z79891 Long term (current) use of opiate analgesic: Secondary | ICD-10-CM

## 2018-10-20 DIAGNOSIS — Z7952 Long term (current) use of systemic steroids: Secondary | ICD-10-CM

## 2018-10-20 DIAGNOSIS — E063 Autoimmune thyroiditis: Secondary | ICD-10-CM

## 2018-10-20 DIAGNOSIS — M797 Fibromyalgia: Secondary | ICD-10-CM

## 2018-10-20 DIAGNOSIS — M069 Rheumatoid arthritis, unspecified: Secondary | ICD-10-CM

## 2018-10-20 DIAGNOSIS — I5032 Chronic diastolic (congestive) heart failure: Secondary | ICD-10-CM | POA: Diagnosis not present

## 2018-10-20 DIAGNOSIS — I110 Hypertensive heart disease with heart failure: Secondary | ICD-10-CM | POA: Diagnosis not present

## 2018-10-20 DIAGNOSIS — I700 Atherosclerosis of aorta: Secondary | ICD-10-CM

## 2018-10-20 DIAGNOSIS — M3500 Sicca syndrome, unspecified: Secondary | ICD-10-CM

## 2018-10-20 DIAGNOSIS — J1289 Other viral pneumonia: Secondary | ICD-10-CM | POA: Diagnosis not present

## 2018-10-20 DIAGNOSIS — J9601 Acute respiratory failure with hypoxia: Secondary | ICD-10-CM | POA: Diagnosis not present

## 2018-10-20 DIAGNOSIS — U071 COVID-19: Secondary | ICD-10-CM | POA: Diagnosis not present

## 2018-10-20 DIAGNOSIS — Z9981 Dependence on supplemental oxygen: Secondary | ICD-10-CM

## 2018-10-20 NOTE — Telephone Encounter (Signed)
Form completed and placed in basket  

## 2018-10-20 NOTE — Telephone Encounter (Signed)
Received forms from back and faxed. °

## 2018-10-21 DIAGNOSIS — J9601 Acute respiratory failure with hypoxia: Secondary | ICD-10-CM | POA: Diagnosis not present

## 2018-10-21 DIAGNOSIS — I110 Hypertensive heart disease with heart failure: Secondary | ICD-10-CM | POA: Diagnosis not present

## 2018-10-21 DIAGNOSIS — U071 COVID-19: Secondary | ICD-10-CM | POA: Diagnosis not present

## 2018-10-21 DIAGNOSIS — I5032 Chronic diastolic (congestive) heart failure: Secondary | ICD-10-CM | POA: Diagnosis not present

## 2018-10-21 DIAGNOSIS — J1289 Other viral pneumonia: Secondary | ICD-10-CM | POA: Diagnosis not present

## 2018-10-23 DIAGNOSIS — J9601 Acute respiratory failure with hypoxia: Secondary | ICD-10-CM | POA: Diagnosis not present

## 2018-10-23 DIAGNOSIS — I5032 Chronic diastolic (congestive) heart failure: Secondary | ICD-10-CM | POA: Diagnosis not present

## 2018-10-23 DIAGNOSIS — I110 Hypertensive heart disease with heart failure: Secondary | ICD-10-CM | POA: Diagnosis not present

## 2018-10-23 DIAGNOSIS — U071 COVID-19: Secondary | ICD-10-CM | POA: Diagnosis not present

## 2018-10-23 DIAGNOSIS — J1289 Other viral pneumonia: Secondary | ICD-10-CM | POA: Diagnosis not present

## 2018-10-27 ENCOUNTER — Telehealth: Payer: Self-pay | Admitting: Family Medicine

## 2018-10-27 DIAGNOSIS — J9601 Acute respiratory failure with hypoxia: Secondary | ICD-10-CM | POA: Diagnosis not present

## 2018-10-27 DIAGNOSIS — J1289 Other viral pneumonia: Secondary | ICD-10-CM | POA: Diagnosis not present

## 2018-10-27 DIAGNOSIS — I5032 Chronic diastolic (congestive) heart failure: Secondary | ICD-10-CM | POA: Diagnosis not present

## 2018-10-27 DIAGNOSIS — I110 Hypertensive heart disease with heart failure: Secondary | ICD-10-CM | POA: Diagnosis not present

## 2018-10-27 DIAGNOSIS — U071 COVID-19: Secondary | ICD-10-CM | POA: Diagnosis not present

## 2018-10-27 NOTE — Telephone Encounter (Signed)
Tresa Endo from [facility] called in asking for verbal orders to start winging the pt off of her portable oxygen. She would also like to know what is the appropriate range pt's O2 should be in.   [place] said that pt would like to know if she needs a f/up appt with T[facility] and would she like her to have another chest xray.    Tresa Endo is aware Beverely Low is not in the office today.   Please call with verbal orders to [phone]

## 2018-10-28 ENCOUNTER — Telehealth: Payer: Self-pay | Admitting: *Deleted

## 2018-10-28 NOTE — Telephone Encounter (Signed)
Ok for orders? 

## 2018-10-28 NOTE — Telephone Encounter (Signed)
Ok for orders.  O2 should not drop below 91%.  No need for repeat CXR at this time

## 2018-10-28 NOTE — Telephone Encounter (Signed)
Called and LMOVM to return call as no name was listed and did not say it was a secure number.

## 2018-10-28 NOTE — Telephone Encounter (Signed)
Called pt and left a message to call us back regarding donating plasma at [phone] and let them know you were calling back in reference to donating plasma.

## 2018-10-29 ENCOUNTER — Encounter: Payer: Self-pay | Admitting: Family Medicine

## 2018-10-29 DIAGNOSIS — J1289 Other viral pneumonia: Secondary | ICD-10-CM | POA: Diagnosis not present

## 2018-10-29 DIAGNOSIS — J9601 Acute respiratory failure with hypoxia: Secondary | ICD-10-CM | POA: Diagnosis not present

## 2018-10-29 DIAGNOSIS — I5032 Chronic diastolic (congestive) heart failure: Secondary | ICD-10-CM | POA: Diagnosis not present

## 2018-10-29 DIAGNOSIS — I110 Hypertensive heart disease with heart failure: Secondary | ICD-10-CM | POA: Diagnosis not present

## 2018-10-29 DIAGNOSIS — U071 COVID-19: Secondary | ICD-10-CM | POA: Diagnosis not present

## 2018-10-29 NOTE — Telephone Encounter (Signed)
Verbal orders given  

## 2018-10-31 DIAGNOSIS — U071 COVID-19: Secondary | ICD-10-CM | POA: Diagnosis not present

## 2018-12-01 DIAGNOSIS — U071 COVID-19: Secondary | ICD-10-CM | POA: Diagnosis not present

## 2018-12-07 ENCOUNTER — Other Ambulatory Visit: Payer: Self-pay | Admitting: Family Medicine

## 2018-12-14 DIAGNOSIS — M0579 Rheumatoid arthritis with rheumatoid factor of multiple sites without organ or systems involvement: Secondary | ICD-10-CM | POA: Diagnosis not present

## 2018-12-14 DIAGNOSIS — M359 Systemic involvement of connective tissue, unspecified: Secondary | ICD-10-CM | POA: Diagnosis not present

## 2018-12-14 DIAGNOSIS — Z79899 Other long term (current) drug therapy: Secondary | ICD-10-CM | POA: Diagnosis not present

## 2018-12-14 DIAGNOSIS — M797 Fibromyalgia: Secondary | ICD-10-CM | POA: Diagnosis not present

## 2018-12-14 LAB — CBC AND DIFFERENTIAL
HCT: 40 (ref 36–46)
Hemoglobin: 13.2 (ref 12.0–16.0)
Neutrophils Absolute: 6
Platelets: 249 (ref 150–399)
WBC: 7.7

## 2018-12-14 LAB — BASIC METABOLIC PANEL
BUN: 21 (ref 4–21)
Creatinine: 0.9 (ref 0.5–1.1)
Glucose: 89
Potassium: 4.8 (ref 3.4–5.3)
Sodium: 142 (ref 137–147)

## 2018-12-14 LAB — HEPATIC FUNCTION PANEL
ALT: 18 (ref 7–35)
AST: 23 (ref 13–35)
Alkaline Phosphatase: 97 (ref 25–125)
Bilirubin, Total: 0.4

## 2018-12-24 ENCOUNTER — Encounter: Payer: Self-pay | Admitting: General Practice

## 2018-12-30 ENCOUNTER — Encounter: Payer: Self-pay | Admitting: Family Medicine

## 2018-12-31 ENCOUNTER — Encounter: Payer: Self-pay | Admitting: Family Medicine

## 2018-12-31 DIAGNOSIS — U071 COVID-19: Secondary | ICD-10-CM | POA: Diagnosis not present

## 2019-01-07 ENCOUNTER — Encounter: Payer: Self-pay | Admitting: Family Medicine

## 2019-01-13 ENCOUNTER — Encounter: Payer: Self-pay | Admitting: Family Medicine

## 2019-01-13 MED ORDER — BACLOFEN 10 MG PO TABS: 10.0000 mg | ORAL_TABLET | Freq: Three times a day (TID) | ORAL | 2 refills | Status: DC

## 2019-02-15 ENCOUNTER — Encounter: Payer: Self-pay | Admitting: Family Medicine

## 2019-02-15 ENCOUNTER — Ambulatory Visit (INDEPENDENT_AMBULATORY_CARE_PROVIDER_SITE_OTHER): Payer: Medicare Other | Admitting: Family Medicine

## 2019-02-15 ENCOUNTER — Other Ambulatory Visit: Payer: Self-pay

## 2019-02-15 VITALS — BP 121/80 | HR 78 | Temp 97.9°F | Resp 16 | Ht 63.0 in | Wt 142.5 lb

## 2019-02-15 DIAGNOSIS — R350 Frequency of micturition: Secondary | ICD-10-CM

## 2019-02-15 DIAGNOSIS — R319 Hematuria, unspecified: Secondary | ICD-10-CM

## 2019-02-15 DIAGNOSIS — M542 Cervicalgia: Secondary | ICD-10-CM | POA: Diagnosis not present

## 2019-02-15 DIAGNOSIS — M5441 Lumbago with sciatica, right side: Secondary | ICD-10-CM | POA: Diagnosis not present

## 2019-02-15 LAB — POCT URINALYSIS DIPSTICK
Bilirubin, UA: NEGATIVE
Glucose, UA: NEGATIVE
Leukocytes, UA: NEGATIVE
Nitrite, UA: NEGATIVE
Protein, UA: NEGATIVE
Spec Grav, UA: 1.025 (ref 1.010–1.025)
Urobilinogen, UA: 0.2 E.U./dL
pH, UA: 5 (ref 5.0–8.0)

## 2019-02-15 MED ORDER — CEPHALEXIN 500 MG PO CAPS: 500.0000 mg | ORAL_CAPSULE | Freq: Two times a day (BID) | ORAL | 0 refills | Status: AC

## 2019-02-15 MED ORDER — PREDNISONE 10 MG PO TABS: ORAL_TABLET | ORAL | 0 refills | Status: DC

## 2019-02-15 NOTE — Patient Instructions (Signed)
We'll call you with your Carotid Ultrasound appt and then determine the next steps START the Prednisone taper as directed- 3 tabs at the same time x3 days, and then 2 tabs at the same time x3 days, and then 1 tab daily.  Take w/ food START the Cephalexin twice daily for likely UTI Increase your water intake Call with any questions or concerns Hang in there!

## 2019-02-15 NOTE — Progress Notes (Signed)
   Subjective:    Patient ID: Becky Boyd, female    DOB: 1956/05/15, 63 y.o.   MRN: [id number]  HPI Neck pain- pt reports since hospital d/c she is having 'cramp' like pains in R side of neck that radiates up into jaw and roof of mouth.  And severe pain after raking the yard- lasted 5 minutes before resolving.  Pt reports similar sxs ~3 yrs ago 'it just got better and I never gave it another thought'.  Occurring 0-3x/week.  Occurs w/ exertion and at rest.  Only on R side.  No TTP.  Will resolve spontaneously or w/ 'a baby ASA'.  No associated CP, dizziness.  R LBP- radiates to just below R buttock.  'it really hurts'.  sxs started 'weeks' ago.  Pain is causing her to avoid her regular walks.  Hx of RA.  Urinary frequency- 'I pee all the time'.  + LBP.  No hematuria.     Review of Systems For ROS see HPI     Objective:   Physical Exam Vitals signs reviewed.  Constitutional:      General: She is not in acute distress.    Appearance: Normal appearance. She is not ill-appearing.  HENT:     Head: Normocephalic and atraumatic.  Neck:     Musculoskeletal: Normal range of motion. Muscular tenderness present. No neck rigidity.     Vascular: No carotid bruit.  Cardiovascular:     Rate and Rhythm: Normal rate and regular rhythm.     Pulses: Normal pulses.     Heart sounds: Normal heart sounds. No murmur.  Pulmonary:     Effort: Pulmonary effort is normal. No respiratory distress.     Breath sounds: Normal breath sounds. No wheezing or rhonchi.  Abdominal:     General: There is no distension.     Tenderness: There is no abdominal tenderness. There is no right CVA tenderness or left CVA tenderness.  Musculoskeletal:     Comments: TTP over R lower back and R gluteal crease  Lymphadenopathy:     Cervical: No cervical adenopathy.  Skin:    General: Skin is warm and dry.  Neurological:     General: No focal deficit present.     Mental Status: She is alert and oriented to person, place, and  time.  Psychiatric:        Mood and Affect: Mood normal.        Behavior: Behavior normal.        Thought Content: Thought content normal.           Assessment & Plan:  R sided neck pain- new.  Appears to be musculoskeletal but need to r/o vasospasm or carotid abnormality.  Will get carotid dopplers.  If negative, will get [place] to assess neck.  Start Prednisone taper for neck and back pain.  Heat.  Reviewed supportive care and red flags that should prompt return.  Pt expressed understanding and is in agreement w/ plan.   R LBP- deteriorated.  Pt has chronic pain but she reports this has acutely worsened.  Start Prednisone taper and notify Dr Amil Amen.  Pt expressed understanding and is in agreement w/ plan.   Urinary frequency- new.  UA suspicious for UTI. Start Keflex and await culture results.  Pt expressed understanding and is in agreement w/ plan.

## 2019-02-17 LAB — URINE CULTURE
MICRO NUMBER:: 877963
SPECIMEN QUALITY:: ADEQUATE

## 2019-02-22 ENCOUNTER — Encounter: Payer: Self-pay | Admitting: Family Medicine

## 2019-02-22 ENCOUNTER — Ambulatory Visit (HOSPITAL_BASED_OUTPATIENT_CLINIC_OR_DEPARTMENT_OTHER)
Admission: RE | Admit: 2019-02-22 | Discharge: 2019-02-22 | Disposition: A | Discharge status: Home / Self Care | Payer: Medicare Other | Source: Ambulatory Visit | Attending: Family Medicine | Admitting: Family Medicine

## 2019-02-22 ENCOUNTER — Other Ambulatory Visit: Payer: Self-pay

## 2019-02-22 DIAGNOSIS — R079 Chest pain, unspecified: Secondary | ICD-10-CM

## 2019-02-22 DIAGNOSIS — M542 Cervicalgia: Secondary | ICD-10-CM

## 2019-02-22 DIAGNOSIS — R000 Tachycardia, unspecified: Secondary | ICD-10-CM

## 2019-02-22 MED ORDER — LEVOTHYROXINE SODIUM 75 MCG PO TABS: 75.0000 ug | ORAL_TABLET | Freq: Every day | ORAL | 1 refills | Status: DC

## 2019-02-22 NOTE — Telephone Encounter (Signed)
Pt made aware of PCP recommendations to seek treatment at ED. Referral placed. And med filled.

## 2019-03-05 ENCOUNTER — Other Ambulatory Visit: Payer: Self-pay | Admitting: Family Medicine

## 2019-03-12 ENCOUNTER — Other Ambulatory Visit: Payer: Self-pay | Admitting: Family Medicine

## 2019-03-12 NOTE — Telephone Encounter (Signed)
Please advise 

## 2019-03-16 ENCOUNTER — Encounter: Payer: Self-pay | Admitting: Family Medicine

## 2019-03-16 DIAGNOSIS — Z79899 Other long term (current) drug therapy: Secondary | ICD-10-CM | POA: Diagnosis not present

## 2019-03-16 DIAGNOSIS — M359 Systemic involvement of connective tissue, unspecified: Secondary | ICD-10-CM | POA: Diagnosis not present

## 2019-03-16 DIAGNOSIS — M5136 Other intervertebral disc degeneration, lumbar region: Secondary | ICD-10-CM | POA: Diagnosis not present

## 2019-03-16 DIAGNOSIS — M0579 Rheumatoid arthritis with rheumatoid factor of multiple sites without organ or systems involvement: Secondary | ICD-10-CM | POA: Diagnosis not present

## 2019-03-16 DIAGNOSIS — M797 Fibromyalgia: Secondary | ICD-10-CM | POA: Diagnosis not present

## 2019-04-22 ENCOUNTER — Encounter: Payer: Self-pay | Admitting: Family Medicine

## 2019-05-06 ENCOUNTER — Other Ambulatory Visit: Payer: Self-pay | Admitting: Family Medicine

## 2019-05-06 DIAGNOSIS — Z1231 Encounter for screening mammogram for malignant neoplasm of breast: Secondary | ICD-10-CM

## 2019-06-16 DIAGNOSIS — M0579 Rheumatoid arthritis with rheumatoid factor of multiple sites without organ or systems involvement: Secondary | ICD-10-CM | POA: Diagnosis not present

## 2019-06-16 DIAGNOSIS — M5136 Other intervertebral disc degeneration, lumbar region: Secondary | ICD-10-CM | POA: Diagnosis not present

## 2019-06-16 DIAGNOSIS — M797 Fibromyalgia: Secondary | ICD-10-CM | POA: Diagnosis not present

## 2019-06-16 DIAGNOSIS — Z79899 Other long term (current) drug therapy: Secondary | ICD-10-CM | POA: Diagnosis not present

## 2019-06-16 DIAGNOSIS — M359 Systemic involvement of connective tissue, unspecified: Secondary | ICD-10-CM | POA: Diagnosis not present

## 2019-06-17 DIAGNOSIS — M0579 Rheumatoid arthritis with rheumatoid factor of multiple sites without organ or systems involvement: Secondary | ICD-10-CM | POA: Diagnosis not present

## 2019-07-03 ENCOUNTER — Ambulatory Visit: Payer: Medicare Other

## 2019-07-08 ENCOUNTER — Ambulatory Visit: Payer: Medicare Other

## 2019-08-04 ENCOUNTER — Encounter: Payer: Self-pay | Admitting: Family Medicine

## 2019-08-09 ENCOUNTER — Other Ambulatory Visit: Payer: Self-pay | Admitting: General Practice

## 2019-08-09 ENCOUNTER — Encounter: Payer: Self-pay | Admitting: Family Medicine

## 2019-08-09 ENCOUNTER — Telehealth: Payer: Self-pay

## 2019-08-09 DIAGNOSIS — U071 COVID-19: Secondary | ICD-10-CM

## 2019-08-09 MED ORDER — ALBUTEROL SULFATE HFA 108 (90 BASE) MCG/ACT IN AERS: 2.0000 | INHALATION_SPRAY | RESPIRATORY_TRACT | 1 refills | Status: DC | PRN

## 2019-08-09 NOTE — Telephone Encounter (Signed)
Patient's pharmacy called in stating the patient's insurance won't pay for the ventolin but will pay for Proair. Please advise.

## 2019-08-10 NOTE — Telephone Encounter (Signed)
Reviewed in PCP absence. Ok to give them verbal to change brands of albuterol.

## 2019-08-10 NOTE — Telephone Encounter (Signed)
Called pharmacy but they were closed. Left message on prescriber line to give a verbal order per PA Martin's recommendation.

## 2019-08-19 ENCOUNTER — Institutional Professional Consult (permissible substitution): Payer: Medicare Other | Admitting: Internal Medicine

## 2019-08-23 ENCOUNTER — Ambulatory Visit: Payer: Medicare Other | Admitting: Internal Medicine

## 2019-08-23 ENCOUNTER — Other Ambulatory Visit: Payer: Self-pay

## 2019-08-23 ENCOUNTER — Ambulatory Visit (INDEPENDENT_AMBULATORY_CARE_PROVIDER_SITE_OTHER): Payer: Medicare Other

## 2019-08-23 ENCOUNTER — Telehealth: Payer: Self-pay | Admitting: *Deleted

## 2019-08-23 ENCOUNTER — Encounter: Payer: Self-pay | Admitting: Internal Medicine

## 2019-08-23 VITALS — BP 130/80 | HR 88 | Temp 97.7°F | Ht 63.0 in | Wt 152.2 lb

## 2019-08-23 DIAGNOSIS — R0602 Shortness of breath: Secondary | ICD-10-CM

## 2019-08-23 DIAGNOSIS — R053 Chronic cough: Secondary | ICD-10-CM

## 2019-08-23 DIAGNOSIS — R05 Cough: Secondary | ICD-10-CM

## 2019-08-23 DIAGNOSIS — U071 COVID-19: Secondary | ICD-10-CM | POA: Diagnosis not present

## 2019-08-23 DIAGNOSIS — R002 Palpitations: Secondary | ICD-10-CM

## 2019-08-23 MED ORDER — PANTOPRAZOLE SODIUM 40 MG PO TBEC: 40.0000 mg | DELAYED_RELEASE_TABLET | Freq: Every day | ORAL | 3 refills | Status: DC

## 2019-08-23 NOTE — Progress Notes (Signed)
Becky Boyd    [id number]    24-Nov-1955  Primary Care Physician:Tabori, Becky Millet, MD  Referring Physician: Midge Minium, MD [address] [place],  [place] [address] Reason for Consultation: shortness of breath Date of Consultation: 08/23/2019  Chief complaint:   Chief Complaint  Patient presents with  . Consult    hx covid April 2020 hospitalized.  sob     HPI: Becky Boyd is a 64 y.o. woman with rheumatoid arthritis on prednisone and sulfasalazine, sjogren's disease, hypothyroidism who presents with shortness of breath. Hospitalized in April 2020, treated with Actemra and steroids, never intubated. Discharged on home oxygen [facility] which she self discontinued. Has a puls oximeter and her oxygen saturation drops down to the low 90s. Dyspnea on exertion - has an albuterol inhaler which sometimes helps.   She has a feeling of something stuck in her throat - she is constantly swallowing Coughs a lot, which is worse at night. Usually dry. Has been going on for months Has gained weight recently - used to way in the 130s - gained 20 lbs in the last year because she hasn't been able to walk as much.   Notes that she is having palpitations and HR goes into the 130s while watching tv - this happens 3-4 times/week. Does not wake her up in her sleep.   Social history:  Occupation: worked at [employer] for many years, retired Exposures: Lives at home with her husband, 2 dogs, 6 cats Smoking history: never smokers.   Social History   Occupational History  . Not on file  Tobacco Use  . Smoking status: Never Smoker  . Smokeless tobacco: Never Used  Substance and Sexual Activity  . Alcohol use: Never  . Drug use: Never  . Sexual activity: Not on file    Relevant family history:  Family History  Problem Relation Age of Onset  . Diabetes Sister   . Breast cancer Paternal Aunt   . COPD Mother   . Heart disease Father   . COPD Brother   . Aneurysm Paternal Aunt   .  Diabetes Other     Past Medical History:  Diagnosis Date  . Aneurysm ([place])   . Anorexia   . Dysphagia   . Fibromyalgia   . Fibromyalgia   . Hashimoto thyroiditis   . Hypothyroidism   . Kidney stones   . Lupus ([place])   . Migraines   . Rheumatoid arthritis ([place])   . Sjogren's disease [facility])     Past Surgical History:  Procedure Laterality Date  . ABDOMINAL HYSTERECTOMY    . CERVICAL FUSION  2004   C4-C5 with Instrumentation  . CERVICAL FUSION  1990   C3-C4  . cervical rods    . CHOLECYSTECTOMY    . kidney stone    . l5-s1 surgery    . LUMBAR MICRODISCECTOMY  2004   Bilateral L5-S1     Review of systems: Review of Systems  Constitutional: Negative for chills, fever and weight loss.  HENT: Negative for congestion, sinus pain and sore throat.   Eyes: Negative for discharge and redness.  Respiratory: Positive for cough and shortness of breath. Negative for hemoptysis, sputum production and wheezing.   Cardiovascular: Negative for chest pain, palpitations and leg swelling.  Gastrointestinal: Negative for heartburn, nausea and vomiting.  Musculoskeletal: Positive for joint pain. Negative for myalgias.  Skin: Negative for rash.  Neurological: Negative for dizziness, tremors, focal weakness  and headaches.  Endo/Heme/Allergies: Negative for environmental allergies.  Psychiatric/Behavioral: Negative for depression. The patient is not nervous/anxious.   All other systems reviewed and are negative.   Physical Exam: Blood pressure 130/80, pulse 88, temperature 97.7 F (36.5 C), temperature source Temporal, height 5\' 3"  (1.6 m), weight 152 lb 3.2 oz (69 kg), SpO2 94 %. Gen:      No acute distress ENT:  no nasal polyps, mucus membranes moist Lungs:    No increased respiratory effort, symmetric chest wall excursion, clear to auscultation bilaterally, no wheezes or crackles CV:         Regular rate and rhythm; no murmurs, rubs, or gallops.  No pedal edema MSK: no acute synovitis  of DIP or PIP joints, no mechanics hands.  Skin:      Warm and dry; no rashes Neuro: normal speech, no focal facial asymmetry Psych: alert and oriented x3, normal mood and affect   Data Reviewed/Medical Decision Making:  Independent interpretation of tests: Imaging: . Review of patient's chest xray images from April 2020 revealed bibasilar airspace disease. The patient's images have been independently reviewed by me.    PFTs:  Labs:  Lab Results  Component Value Date   WBC 7.7 12/14/2018   HGB 13.2 12/14/2018   HCT 40 12/14/2018   MCV 100.8 (H) 10/01/2018   PLT 249 12/14/2018   Lab Results  Component Value Date   NA 142 12/14/2018   K 4.8 12/14/2018   CL 96 (L) 10/01/2018   CO2 32 10/01/2018     Immunization status:  Immunization History  Administered Date(s) Administered  . Influenza, Seasonal, Injecte, Preservative Fre 07/04/2016  . Influenza,inj,Quad PF,6+ Mos 04/22/2013, 04/01/2014, 01/27/2017, 03/04/2019  . Moderna SARS-COVID-2 Vaccination 07/09/2019, 08/05/2019    . I reviewed prior external note(s) from Hospital stay in April 2020. Dr. May 2020.  . I reviewed the result(s) of the labs and imaging as noted above.  . I have ordered chest xray   Assessment:  Shortness of breath Chronic Cough  GERD Palpitations  Plan/Recommendations: Ms. Caniglia has palpitations with heart rates documented on home pulse oximetry in the 130s which occurs 3-4 times a week.  I think she needs to see cardiology for this to make sure she is not going into A. fib with RVR.  Hopefully if she is having his episodes 3-4 times a week they can catch it on a Holter monitor or similar.  Guarding or shortness of breath may or may not be related to these palpitations.  She should continue the albuterol inhaler as needed this may be helping.  Cough and globus sensation seems to be the bigger issue which is consistent with possible reflux.  We will do a trial of PPI daily.  We discussed spirometry  to evaluate for dyspnea from asthma or any kind of intrinsic lung disease.  She would like to hold off on that for now.  I did obtain a chest x-ray today which I personally reviewed and this had improved aeration in both bases when compared to her chest x-rays from a year ago while hospitalized with Covid.  We may consider spirometry in the future if her symptoms do not improve with management of GERD and palpitations.   Return to Care: Return in about 3 months (around 11/23/2019).  11/25/2019, MD Pulmonary and Critical Care Medicine  HealthCare Office:[phone]  CC: Durel Salts, MD

## 2019-08-23 NOTE — Patient Instructions (Addendum)
The patient should have follow up scheduled 3 months.  Start taking protonix once a day every day to see if it helps cough.  Schedule with the cardiologist.    What is GERD? Gastroesophageal reflux disease (GERD) is gastroesophageal reflux diseasewhich occurs when the lower esophageal sphincter (LES) opens spontaneously, for varying periods of time, or does not close properly and stomach contents rise up into the esophagus. GER is also called acid reflux or acid regurgitation, because digestive juices--called acids--rise up with the food. The esophagus is the tube that carries food from the mouth to the stomach. The LES is a ring of muscle at the bottom of the esophagus that acts like a valve between the esophagus and stomach.  When acid reflux occurs, food or fluid can be tasted in the back of the mouth. When refluxed stomach acid touches the lining of the esophagus it may cause a burning sensation in the chest or throat called heartburn or acid indigestion. Occasional reflux is common. Persistent reflux that occurs more than twice a week is considered GERD, and it can eventually lead to more serious health problems. People of all ages can have GERD. Studies have shown that GERD may worsen or contribute to asthma, chronic cough, and pulmonary fibrosis.   What are the symptoms of GERD? The main symptom of GERD in adults is frequent heartburn, also called acid indigestion--burning-type pain in the lower part of the mid-chest, behind the breast bone, and in the mid-abdomen.  Not all reflux is acidic in nature, and many patients don't have heart burn at all. Sometimes it feels like a cough (either dry or with mucus), choking sensation, asthma, shortness of breath, waking up at night, frequent throat clearing, or trouble swallowing.    What causes GERD? The reason some people develop GERD is still unclear. However, research shows that in people with GERD, the LES relaxes while the rest of the esophagus  is working. Anatomical abnormalities such as a hiatal hernia may also contribute to GERD. A hiatal hernia occurs when the upper part of the stomach and the LES move above the diaphragm, the muscle wall that separates the stomach from the chest. Normally, the diaphragm helps the LES keep acid from rising up into the esophagus. When a hiatal hernia is present, acid reflux can occur more easily. A hiatal hernia can occur in people of any age and is most often a normal finding in otherwise healthy people over age 61. Most of the time, a hiatal hernia produces no symptoms.   Other factors that may contribute to GERD include - Obesity or recent weight gain - Pregnancy  - Smoking  - Diet - Certain medications  Common foods that can worsen reflux symptoms include: - carbonated beverages - artificial sweeteners - citrus fruits  - chocolate  - drinks with caffeine or alcohol  - fatty and fried foods  - garlic and onions  - mint flavorings  - spicy foods  - tomato-based foods, like spaghetti sauce, salsa, chili, and pizza   Lifestyle Changes If you smoke, stop.  Avoid foods and beverages that worsen symptoms (see above.) Lose weight if needed.  Eat small, frequent meals.  Wear loose-fitting clothes.  Avoid lying down for 3 hours after a meal.  Raise the head of your bed 6 to 8 inches by securing wood blocks under the bedposts. Just using extra pillows will not help, but using a wedge-shaped pillow may be helpful.  Medications  H2 blockers, such as cimetidine (  Tagamet HB), famotidine (Pepcid AC), nizatidine (Axid AR), and ranitidine (Zantac 75), decrease acid production. They are available in prescription strength and over-the-counter strength. These drugs provide short-term relief and are effective for about half of those who have GERD symptoms.  Proton pump inhibitors include omeprazole (Prilosec, Zegerid), lansoprazole (Prevacid), pantoprazole (Protonix), rabeprazole (Aciphex), and  esomeprazole (Nexium), which are available by prescription. Prilosec is also available in over-the-counter strength. Proton pump inhibitors are more effective than H2 blockers and can relieve symptoms and heal the esophageal lining in almost everyone who has GERD.  Because drugs work in different ways, combinations of medications may help control symptoms. People who get heartburn after eating may take both antacids and H2 blockers. The antacids work first to neutralize the acid in the stomach, and then the H2 blockers act on acid production. By the time the antacid stops working, the H2 blocker will have stopped acid production. Your health care provider is the best source of information about how to use medications for GERD.   Points to Remember 1. You can have GERD without having heartburn. Your symptoms could include a dry cough, asthma symptoms, or trouble swallowing.  2. Taking medications daily as prescribed is important in controlling you symptoms.  Sometimes it can take up to 8 weeks to fully achieve the effects of the medications prescribed.  3. Coughing related to GERD can be difficult to treat and is very frustrating!  However, it is important to stick with these medications and lifestyle modifications before pursuing more aggressive or invasive test and treatments.

## 2019-08-23 NOTE — Telephone Encounter (Signed)
Per Dr. Celine Mans:  Chest xray is improved from previous xray. No change in recommendations stated at office visit.  Attempted to call pt to let her know the results of the cxr but unable to reach. Per DPR okay to leave detailed message. Left detailed message for pt of the cxr results. Nothing further needed.

## 2019-08-26 ENCOUNTER — Other Ambulatory Visit: Payer: Self-pay | Admitting: Family Medicine

## 2019-09-01 ENCOUNTER — Telehealth: Payer: Self-pay | Admitting: Family Medicine

## 2019-09-01 NOTE — Progress Notes (Signed)
  Chronic Care Management   Outreach Note  09/01/2019 Name: Mariaisabel Bodiford MRN: [id number] DOB: [birth year]  Referred by: Sheliah Hatch, MD Reason for referral : No chief complaint on file.   An unsuccessful telephone outreach was attempted today. The patient was referred to the pharmacist for assistance with care management and care coordination.   Follow Up Plan:   Lynnae January Upstream Scheduler

## 2019-09-03 ENCOUNTER — Encounter: Payer: Self-pay | Admitting: General Practice

## 2019-09-09 ENCOUNTER — Telehealth: Payer: Self-pay | Admitting: Family Medicine

## 2019-09-09 NOTE — Progress Notes (Signed)
  Chronic Care Management   Outreach Note  09/09/2019 Name: Becky Boyd MRN: [id number] DOB: July 04, 1955  Referred by: Sheliah Hatch, MD Reason for referral : No chief complaint on file.   An unsuccessful telephone outreach was attempted today. The patient was referred to the pharmacist for assistance with care management and care coordination.   Follow Up Plan:   Lynnae January Upstream Scheduler

## 2019-09-15 DIAGNOSIS — M359 Systemic involvement of connective tissue, unspecified: Secondary | ICD-10-CM | POA: Diagnosis not present

## 2019-09-15 DIAGNOSIS — M0579 Rheumatoid arthritis with rheumatoid factor of multiple sites without organ or systems involvement: Secondary | ICD-10-CM | POA: Diagnosis not present

## 2019-09-15 DIAGNOSIS — Z79899 Other long term (current) drug therapy: Secondary | ICD-10-CM | POA: Diagnosis not present

## 2019-09-22 ENCOUNTER — Telehealth: Payer: Self-pay | Admitting: Family Medicine

## 2019-09-22 NOTE — Progress Notes (Signed)
  Chronic Care Management   Outreach Note  09/22/2019 Name: Becky Boyd MRN: [id number] DOB: November 04, 1955  Referred by: Sheliah Hatch, MD Reason for referral : No chief complaint on file.   An unsuccessful telephone outreach was attempted today. The patient was referred to the pharmacist for assistance with care management and care coordination.   Follow Up Plan:   Lynnae January Upstream Scheduler

## 2019-09-24 ENCOUNTER — Other Ambulatory Visit: Payer: Self-pay

## 2019-09-24 ENCOUNTER — Ambulatory Visit: Payer: Medicare Other | Admitting: Internal Medicine

## 2019-09-24 ENCOUNTER — Telehealth: Payer: Self-pay | Admitting: Radiology

## 2019-09-24 ENCOUNTER — Encounter: Payer: Self-pay | Admitting: Internal Medicine

## 2019-09-24 VITALS — BP 138/82 | HR 83 | Ht 63.0 in | Wt 152.0 lb

## 2019-09-24 DIAGNOSIS — E038 Other specified hypothyroidism: Secondary | ICD-10-CM

## 2019-09-24 DIAGNOSIS — R002 Palpitations: Secondary | ICD-10-CM

## 2019-09-24 DIAGNOSIS — R0602 Shortness of breath: Secondary | ICD-10-CM

## 2019-09-24 DIAGNOSIS — Z8616 Personal history of COVID-19: Secondary | ICD-10-CM

## 2019-09-24 DIAGNOSIS — M05711 Rheumatoid arthritis with rheumatoid factor of right shoulder without organ or systems involvement: Secondary | ICD-10-CM

## 2019-09-24 DIAGNOSIS — E785 Hyperlipidemia, unspecified: Secondary | ICD-10-CM

## 2019-09-24 DIAGNOSIS — M3500 Sicca syndrome, unspecified: Secondary | ICD-10-CM

## 2019-09-24 NOTE — Telephone Encounter (Signed)
Enrolled patient for a 30 [employer] Event monitor to be mailed to patients home.  

## 2019-09-24 NOTE — Patient Instructions (Signed)
Medication Instructions:  Continue same medications    Lab Work: None ordered    Testing/Procedures: Schedule Echo Schedule 30 day Event Monitor   Follow-Up: At [facility], you and your health needs are our priority.  As part of our continuing mission to provide you with exceptional heart care, we have created designated Provider Care Teams.  These Care Teams include your primary Cardiologist (physician) and Advanced Practice Providers (APPs -  Physician Assistants and Nurse Practitioners) who all work together to provide you with the care you need, when you need it.  We recommend signing up for the patient portal called "MyChart".  Sign up information is provided on this After Visit Summary.  MyChart is used to connect with patients for Virtual Visits (Telemedicine).  Patients are able to view lab/test results, encounter notes, upcoming appointments, etc.  Non-urgent messages can be sent to your provider as well.   To learn more about what you can do with MyChart, go to [email or web address].    Your next appointment:  After echo   The format for your next appointment: Virtual      Provider:  Dr.Acharya

## 2019-09-24 NOTE — Progress Notes (Signed)
Cardiology Office Note:    Date:  09/24/2019   ID:  Becky Boyd, DOB 04-19-1956, MRN [id number]  PCP:  Sheliah Hatch, MD  Cardiologist:  No primary care provider on file.  Electrophysiologist:  None   Referring MD: Charlott Holler, MD   Chief Complaint: Fast heart rate  History of Present Illness:    Becky Boyd is a 64 y.o. female with a history of rheumatoid arthritis on prednisone and sulfasalazine, Sjogren's disease, hypothyroidism, who presents for rapid heart rate and palpitations at the request of pulmonary medicine.  She had COVID-19 infection in April 2020 and was treated with Actemra and steroids, did not require intubation.  She was discharged home on 2 L nasal cannula which she self discontinued.  She endorses dyspnea on exertion and uses an albuterol inhaler which occasionally helps her.  While sitting and resting at home, she will have palpitations and notes that her heart rate is in the 130s to 140s beats per minute.  This will occur approximately 3-4 times per week.  Sensation does not wake her from sleep.  She notes occasional chest pain, and notes that she is more short of breath when she has palpitations.  She denies syncope or presyncope associated with palpitations.  She describes symptoms of cough and foreign body sensation in her throat, and was recently started on antacid therapy for reflux, she does not feel it is helping yet.  Snores, and has not yet been evaluated for sleep apnea per chart review.       Past Medical History:  Diagnosis Date  . Aneurysm (HCC)   . Anorexia   . Dysphagia   . Fibromyalgia   . Fibromyalgia   . Hashimoto thyroiditis   . Hypothyroidism   . Kidney stones   . Lupus (HCC)   . Migraines   . Rheumatoid arthritis (HCC)   . Sjogren's disease [facility]     Past Surgical History:  Procedure Laterality Date  . ABDOMINAL HYSTERECTOMY    . CERVICAL FUSION  2004   C4-C5 with Instrumentation  . CERVICAL FUSION  1990   C3-C4    . cervical rods    . CHOLECYSTECTOMY    . kidney stone    . l5-s1 surgery    . LUMBAR MICRODISCECTOMY  2004   Bilateral L5-S1    Current Medications: Current Meds  Medication Sig  . acetaminophen (TYLENOL) 325 MG tablet Take 650 mg by mouth every 6 (six) hours as needed for fever or headache.  . albuterol (VENTOLIN HFA) 108 (90 Base) MCG/ACT inhaler Inhale 2 puffs into the lungs every 4 (four) hours as needed for wheezing or shortness of breath.  Marland Kitchen atorvastatin (LIPITOR) 20 MG tablet TAKE ONE TABLET BY MOUTH ONE TIME DAILY   . baclofen (LIORESAL) 10 MG tablet TAKE ONE TABLET BY MOUTH THREE TIMES DAILY   . DULoxetine (CYMBALTA) 60 MG capsule Take 60 mg by mouth daily.   Marland Kitchen HYDROcodone-acetaminophen (NORCO/VICODIN) 5-325 MG per tablet Take 1-2 tablets by mouth every 6 (six) hours as needed.  Marland Kitchen levothyroxine (SYNTHROID) 75 MCG tablet TAKE ONE TABLET BY MOUTH ONE TIME DAILY   . meloxicam (MOBIC) 15 MG tablet Take 15 mg by mouth daily as needed for pain (takes daily).   . Multiple Vitamin (MULTIVITAMIN WITH MINERALS) TABS tablet Take 1 tablet by mouth daily.  . pantoprazole (PROTONIX) 40 MG tablet Take 1 tablet (40 mg total) by mouth daily.  . predniSONE (DELTASONE) 10 MG tablet 3 tabs x3  days and then 2 tabs x3 days and then 1 tab x3 days.  Take w/ food.  . sulfaSALAzine (AZULFIDINE) 500 MG tablet 1,000 mg 2 (two) times daily.      Allergies:   Plaquenil [hydroxychloroquine sulfate]   Social History   Socioeconomic History  . Marital status: Married    Spouse name: Not on file  . Number of children: 2  . Years of education: Not on file  . Highest education level: High school graduate  Occupational History  . Not on file  Tobacco Use  . Smoking status: Never Smoker  . Smokeless tobacco: Never Used  Substance and Sexual Activity  . Alcohol use: Never  . Drug use: Never  . Sexual activity: Not on file  Other Topics Concern  . Not on file  Social History Narrative   Lives at  home with her husband   Right handed   Caffeine: occasional pepsi or mtn dew   Social Determinants of Health   Financial Resource Strain:   . Difficulty of Paying Living Expenses:   Food Insecurity:   . Worried About Charity fundraiser in the Last Year:   . Arboriculturist in the Last Year:   Transportation Needs:   . Film/video editor (Medical):   Marland Kitchen Lack of Transportation (Non-Medical):   Physical Activity:   . Days of Exercise per Week:   . Minutes of Exercise per Session:   Stress:   . Feeling of Stress :   Social Connections:   . Frequency of Communication with Friends and Family:   . Frequency of Social Gatherings with Friends and Family:   . Attends Religious Services:   . Active Member of Clubs or Organizations:   . Attends Archivist Meetings:   Marland Kitchen Marital Status:      Family History: The patient's family history includes Aneurysm in her paternal aunt; Breast cancer in her paternal aunt; COPD in her brother and mother; Diabetes in her sister and another family member; Heart disease in her father.  ROS:   Please see the history of present illness.    All other systems reviewed and are negative.  EKGs/Labs/Other Studies Reviewed:    The following studies were reviewed today:  EKG:  NSR  I have independently reviewed the images from CTA chest dated 11/01/2014. No definite coronary artery calcifications.  Recent Labs: 12/14/2018: ALT 18; BUN 21; Creatinine 0.9; Hemoglobin 13.2; Platelets 249; Potassium 4.8; Sodium 142  Recent Lipid Panel    Component Value Date/Time   CHOL 173 10/08/2017 1347   TRIG 101 09/17/2018 1550   HDL 62.20 10/08/2017 1347   CHOLHDL 3 10/08/2017 1347   VLDL 21.4 10/08/2017 1347   LDLCALC 89 10/08/2017 1347   LDLDIRECT 123.9 06/17/2013 1529    Physical Exam:    VS:  BP 138/82   Pulse 83   Ht 5\' 3"  (1.6 m)   Wt 152 lb (68.9 kg)   BMI 26.93 kg/m     Wt Readings from Last 5 Encounters:  09/24/19 152 lb (68.9 kg)    08/23/19 152 lb 3.2 oz (69 kg)  02/15/19 142 lb 8 oz (64.6 kg)  10/05/18 135 lb (61.2 kg)  10/01/18 139 lb 5.3 oz (63.2 kg)     Constitutional: No acute distress Eyes: sclera non-icteric, normal conjunctiva and lids ENMT: normal dentition, moist mucous membranes Cardiovascular: regular rhythm, normal rate, no murmurs. S1 and S2 normal. Radial pulses normal bilaterally. No jugular venous  distention.  Respiratory: clear to auscultation bilaterally GI : normal bowel sounds, soft and nontender. No distention.   MSK: extremities warm, well perfused. No edema.  NEURO: grossly nonfocal exam, moves all extremities. PSYCH: alert and oriented x 3, normal mood and affect.   ASSESSMENT:    1. Shortness of breath   2. History of COVID-19   3. Palpitations   4. Hyperlipidemia, unspecified hyperlipidemia type   5. Rheumatoid arthritis involving right shoulder with positive rheumatoid factor (HCC)   6. Sjogren's syndrome, with unspecified organ involvement (HCC)   7. Other specified hypothyroidism    PLAN:    Shortness of breath - Plan: EKG 12-Lead, ECHOCARDIOGRAM COMPLETE, Cardiac event monitor She continues to have shortness of breath after recovery from COVID-19 infection requiring hospitalization but no intubation.  We will evaluate structure and function of the heart given that her last echo was in 2016 and was normal aside from grade 1 diastolic dysfunction.  We will evaluate diastolic function, will evaluate pericardium for features of constriction, and overall LV and RV function.   Palpitations - Plan: EKG 12-Lead, ECHOCARDIOGRAM COMPLETE, Cardiac event monitor  Request for consultation was for elevated heart rate and palpitations.  We will perform a cardiac event monitor to ensure that we can evaluate for atrial fibrillation.  Hyperlipidemia, unspecified hyperlipidemia type-continue atorvastatin  Total time of encounter: 60 minutes total time of encounter, including 35 minutes spent  in face-to-face patient care on the date of this encounter. This time includes coordination of care and counseling regarding above mentioned problem list. Remainder of non-face-to-face time involved reviewing chart documents/testing relevant to the patient encounter and documentation in the medical record. I have independently reviewed documentation from referring provider.   Weston Brass, MD [place]  [facility]    Medication Adjustments/Labs and Tests Ordered: Current medicines are reviewed at length with the patient today.  Concerns regarding medicines are outlined above.  Orders Placed This Encounter  Procedures  . Cardiac event monitor  . EKG 12-Lead  . ECHOCARDIOGRAM COMPLETE   No orders of the defined types were placed in this encounter.   Patient Instructions  Medication Instructions:  Continue same medications    Lab Work: None ordered    Testing/Procedures: Schedule Echo Schedule 30 day Event Monitor   Follow-Up: At [facility], you and your health needs are our priority.  As part of our continuing mission to provide you with exceptional heart care, we have created designated Provider Care Teams.  These Care Teams include your primary Cardiologist (physician) and Advanced Practice Providers (APPs -  Physician Assistants and Nurse Practitioners) who all work together to provide you with the care you need, when you need it.  We recommend signing up for the patient portal called "MyChart".  Sign up information is provided on this After Visit Summary.  MyChart is used to connect with patients for Virtual Visits (Telemedicine).  Patients are able to view lab/test results, encounter notes, upcoming appointments, etc.  Non-urgent messages can be sent to your provider as well.   To learn more about what you can do with MyChart, go to [email or web address].    Your next appointment:  After echo   The format for your next appointment: Virtual       Provider:  Dr.Machel Violante

## 2019-09-29 ENCOUNTER — Telehealth: Payer: Self-pay | Admitting: Family Medicine

## 2019-09-29 NOTE — Progress Notes (Signed)
  Chronic Care Management   Outreach Note  09/29/2019 Name: Becky Boyd MRN: [id number] DOB: 1955/08/22  Referred by: Sheliah Hatch, MD Reason for referral : No chief complaint on file.   An unsuccessful telephone outreach was attempted today. The patient was referred to the pharmacist for assistance with care management and care coordination.  This note is not being shared with the patient for the following reason: To respect privacy (The patient or proxy has requested that the information not be shared). Follow Up Plan:   Lynnae January Upstream Scheduler

## 2019-10-02 ENCOUNTER — Ambulatory Visit (INDEPENDENT_AMBULATORY_CARE_PROVIDER_SITE_OTHER): Payer: Medicare Other

## 2019-10-02 DIAGNOSIS — R002 Palpitations: Secondary | ICD-10-CM

## 2019-10-02 DIAGNOSIS — R0602 Shortness of breath: Secondary | ICD-10-CM | POA: Diagnosis not present

## 2019-10-02 DIAGNOSIS — Z8616 Personal history of COVID-19: Secondary | ICD-10-CM

## 2019-10-11 ENCOUNTER — Telehealth: Payer: Self-pay | Admitting: *Deleted

## 2019-10-11 NOTE — Telephone Encounter (Signed)
Call from [employer], Becky Boyd, that the patient had an episode on Vtach, 11 beats today at 11:38. This is day 10 of the monitor. All other rhytmns have been sinus.   Left a message for the patient to call back to check on them.

## 2019-10-12 NOTE — Telephone Encounter (Signed)
Left message for patient to call back  

## 2019-10-14 ENCOUNTER — Other Ambulatory Visit: Payer: Self-pay

## 2019-10-14 ENCOUNTER — Ambulatory Visit (HOSPITAL_COMMUNITY): Payer: Medicare Other

## 2019-10-14 ENCOUNTER — Telehealth: Payer: Self-pay | Admitting: Internal Medicine

## 2019-10-14 NOTE — Telephone Encounter (Signed)
Attempted to call the pt re: her monitor from 10/11/19 but LM no DPR to call a family member.. pt has echo today at the [facility] office at 3 pm.. called Tammi at [facility] and pt showed up 35 minutes late and she advised the front desk that she had "other" appts today and needed to reschedule.   Tammi asked the pt to please call our office back and pt says she will call back later today.    Addendum: Spoke with the pt re: her 11 beat run of VT on her monitor 10/11/19 at approx 11:30 am   Pt says she does not remember feeling "bad " during that time. She has not been doing anything exertional and does not remember feeling dizzy and no syncope.    LM for pt to call back to change her appt after talking with Dr. Lupe Carney nurse.    Pt had her Echo rescheduled for 11/02/19 but has follow up with Dr. Jacques Navy 10/21/19.Marland Kitchen will reschedule for mid June.

## 2019-10-14 NOTE — Telephone Encounter (Signed)
Patient returned call to [facility] to discuss monitor. Call successfully transferred to [facility].

## 2019-10-15 NOTE — Telephone Encounter (Signed)
LMTCB re: rescheduling her appt until after her Echo.

## 2019-10-18 DIAGNOSIS — M25562 Pain in left knee: Secondary | ICD-10-CM | POA: Diagnosis not present

## 2019-10-21 ENCOUNTER — Telehealth: Payer: Medicare Other | Admitting: Internal Medicine

## 2019-10-26 ENCOUNTER — Other Ambulatory Visit: Payer: Self-pay | Admitting: Family Medicine

## 2019-11-02 ENCOUNTER — Ambulatory Visit (HOSPITAL_COMMUNITY): Payer: Medicare Other | Attending: Cardiology

## 2019-11-02 ENCOUNTER — Other Ambulatory Visit: Payer: Self-pay

## 2019-11-02 DIAGNOSIS — R0602 Shortness of breath: Secondary | ICD-10-CM | POA: Insufficient documentation

## 2019-11-02 DIAGNOSIS — Z8616 Personal history of COVID-19: Secondary | ICD-10-CM | POA: Diagnosis not present

## 2019-11-02 DIAGNOSIS — R002 Palpitations: Secondary | ICD-10-CM | POA: Insufficient documentation

## 2019-11-22 ENCOUNTER — Other Ambulatory Visit: Payer: Self-pay | Admitting: Family Medicine

## 2019-11-25 ENCOUNTER — Encounter: Payer: Self-pay | Admitting: Internal Medicine

## 2019-11-25 ENCOUNTER — Ambulatory Visit: Payer: Medicare Other | Admitting: Internal Medicine

## 2019-11-25 ENCOUNTER — Other Ambulatory Visit: Payer: Self-pay

## 2019-11-25 VITALS — BP 132/84 | HR 93 | Ht 63.0 in | Wt 152.0 lb

## 2019-11-25 DIAGNOSIS — Z8616 Personal history of COVID-19: Secondary | ICD-10-CM | POA: Diagnosis not present

## 2019-11-25 DIAGNOSIS — R002 Palpitations: Secondary | ICD-10-CM

## 2019-11-25 DIAGNOSIS — E785 Hyperlipidemia, unspecified: Secondary | ICD-10-CM

## 2019-11-25 NOTE — Progress Notes (Signed)
Cardiology Office Note:    Date:  11/25/2019   ID:  Becky Boyd, DOB 03/11/1956, MRN [id number]  PCP:  Midge Minium, MD  Cardiologist:  No primary care provider on file.  Electrophysiologist:  None   Referring MD: Midge Minium, MD   Chief Complaint:  palpitations   HPI:    Becky Boyd is a 64 y.o. female with:  rheumatoid arthritis on prednisone and sulfasalazine, Sjogren's disease, hypothyroidism, who presents for rapid heart rate and palpitations at the request of pulmonary medicine. She had COVID-19 infection in April 2020 and was treated with Actemra and steroids, did not require intubation. She was discharged home on 2 L nasal cannula which she self discontinued.   Gerd treatment helped with globus sensation and neck pain.  We reviewed echo and event monitor.  Monitored showed symptoms in sinus tachycardia, but following an episode of NSVT. Echo with preserved EF and grade 1 diastolic dysfunction.   Past Medical History:  Diagnosis Date  . Aneurysm ([place])   . Anorexia   . Dysphagia   . Fibromyalgia   . Fibromyalgia   . Hashimoto thyroiditis   . Hypothyroidism   . Kidney stones   . Lupus ([place])   . Migraines   . Rheumatoid arthritis ([place])   . Sjogren's disease ([place])     Current Medications: Current Meds  Medication Sig  . acetaminophen (TYLENOL) 325 MG tablet Take 650 mg by mouth every 6 (six) hours as needed for fever or headache.  . albuterol (VENTOLIN HFA) 108 (90 Base) MCG/ACT inhaler Inhale 2 puffs into the lungs every 4 (four) hours as needed for wheezing or shortness of breath.  Marland Kitchen atorvastatin (LIPITOR) 20 MG tablet TAKE ONE TABLET BY MOUTH ONE TIME DAILY  . baclofen (LIORESAL) 10 MG tablet TAKE ONE TABLET BY MOUTH THREE TIMES DAILY   . DULoxetine (CYMBALTA) 60 MG capsule Take 60 mg by mouth daily.   Marland Kitchen HYDROcodone-acetaminophen (NORCO/VICODIN) 5-325 MG per tablet Take 1-2 tablets by mouth every 6 (six) hours as needed.  Marland Kitchen levothyroxine  (SYNTHROID) 75 MCG tablet TAKE ONE TABLET BY MOUTH ONE TIME DAILY  . meloxicam (MOBIC) 15 MG tablet Take 15 mg by mouth daily as needed for pain (takes daily).   . Multiple Vitamin (MULTIVITAMIN WITH MINERALS) TABS tablet Take 1 tablet by mouth daily.  . pantoprazole (PROTONIX) 40 MG tablet Take 1 tablet (40 mg total) by mouth daily.  . predniSONE (DELTASONE) 10 MG tablet 3 tabs x3 days and then 2 tabs x3 days and then 1 tab x3 days.  Take w/ food.  . sulfaSALAzine (AZULFIDINE) 500 MG tablet 1,000 mg 2 (two) times daily.      Allergies:   Plaquenil [hydroxychloroquine sulfate]   Social History   Tobacco Use  . Smoking status: Never Smoker  . Smokeless tobacco: Never Used  Vaping Use  . Vaping Use: Never used  Substance Use Topics  . Alcohol use: Never  . Drug use: Never     Family Hx: The patient's family history includes Aneurysm in her paternal aunt; Breast cancer in her paternal aunt; COPD in her brother and mother; Diabetes in her sister and another family member; Heart disease in her father.  ROS: negative except per HPI  EKGs/Labs/Other Test Reviewed:    EKG:  n/a  Recent Labs: 12/14/2018: ALT 18; BUN 21; Creatinine 0.9; Hemoglobin 13.2; Platelets 249; Potassium 4.8; Sodium 142   Recent Lipid Panel Lab Results  Component Value Date/Time   CHOL  173 10/08/2017 01:47 PM   TRIG 101 09/17/2018 03:50 PM   HDL 62.20 10/08/2017 01:47 PM   CHOLHDL 3 10/08/2017 01:47 PM   LDLCALC 89 10/08/2017 01:47 PM   LDLDIRECT 123.9 06/17/2013 03:29 PM    Physical Exam:    VS:  BP 132/84   Pulse 93   Ht 5\' 3"  (1.6 m)   Wt 152 lb (68.9 kg)   SpO2 96%   BMI 26.93 kg/m     Wt Readings from Last 3 Encounters:  11/25/19 152 lb (68.9 kg)  09/24/19 152 lb (68.9 kg)  08/23/19 152 lb 3.2 oz (69 kg)     Constitutional: No acute distress Eyes: sclera non-icteric, normal conjunctiva and lids ENMT: normal dentition, moist mucous membranes Cardiovascular: regular rhythm, normal rate,  no murmurs. S1 and S2 normal. Radial pulses normal bilaterally. No jugular venous distention.  Respiratory: clear to auscultation bilaterally GI : normal bowel sounds, soft and nontender. No distention.   MSK: extremities warm, well perfused. No edema.  NEURO: grossly nonfocal exam, moves all extremities. PSYCH: alert and oriented x 3, normal mood and affect.   ASSESSMENT & PLAN:    1. Palpitations   2. History of COVID-19   3. Hyperlipidemia, unspecified hyperlipidemia type    Palpitations - most likely related to sinus tachycardia but may represent symptomatic NSVT. Will consider adding BB for symptoms.   Hx of Covid 19 - echo grossly normal.   HLD - continue atorvastatin 20 mg daily. Repeat lipids at follow up or with PCP.   Medication Adjustments/Labs and Tests Ordered: Current medicines are reviewed at length with the patient today.  Concerns regarding medicines are outlined above.  Tests Ordered: No orders of the defined types were placed in this encounter.  Medication Changes: No orders of the defined types were placed in this encounter.   Signed, 08/25/19, MD  11/25/2019 3:21 PM    [facility]

## 2019-11-25 NOTE — Patient Instructions (Signed)
Medication Instructions:  NO CHANGES *If you need a refill on your cardiac medications before your next appointment, please call your pharmacy*   Lab Work: NOT NEEDED    Testing/Procedures: NOT NEEDED   Follow-Up: At [facility], you and your health needs are our priority.  As part of our continuing mission to provide you with exceptional heart care, we have created designated Provider Care Teams.  These Care Teams include your primary Cardiologist (physician) and Advanced Practice Providers (APPs -  Physician Assistants and Nurse Practitioners) who all work together to provide you with the care you need, when you need it.    Your next appointment:   6 month(s)  The format for your next appointment:   In Person  Provider:   Gayatri Acharya, MD     

## 2019-11-26 DIAGNOSIS — M25561 Pain in right knee: Secondary | ICD-10-CM | POA: Diagnosis not present

## 2019-11-26 DIAGNOSIS — M25562 Pain in left knee: Secondary | ICD-10-CM | POA: Diagnosis not present

## 2019-12-06 ENCOUNTER — Other Ambulatory Visit: Payer: Self-pay | Admitting: Family Medicine

## 2019-12-14 ENCOUNTER — Other Ambulatory Visit: Payer: Self-pay | Admitting: Family Medicine

## 2019-12-14 ENCOUNTER — Other Ambulatory Visit: Payer: Self-pay | Admitting: Internal Medicine

## 2019-12-14 DIAGNOSIS — R053 Chronic cough: Secondary | ICD-10-CM

## 2019-12-20 DIAGNOSIS — Z79899 Other long term (current) drug therapy: Secondary | ICD-10-CM | POA: Diagnosis not present

## 2019-12-20 DIAGNOSIS — M0579 Rheumatoid arthritis with rheumatoid factor of multiple sites without organ or systems involvement: Secondary | ICD-10-CM | POA: Diagnosis not present

## 2019-12-20 DIAGNOSIS — M797 Fibromyalgia: Secondary | ICD-10-CM | POA: Diagnosis not present

## 2019-12-20 DIAGNOSIS — M359 Systemic involvement of connective tissue, unspecified: Secondary | ICD-10-CM | POA: Diagnosis not present

## 2019-12-20 DIAGNOSIS — M5136 Other intervertebral disc degeneration, lumbar region: Secondary | ICD-10-CM | POA: Diagnosis not present

## 2020-01-05 DIAGNOSIS — M25562 Pain in left knee: Secondary | ICD-10-CM | POA: Diagnosis not present

## 2020-01-21 ENCOUNTER — Other Ambulatory Visit: Payer: Self-pay | Admitting: Family Medicine

## 2020-01-26 ENCOUNTER — Telehealth: Payer: Self-pay | Admitting: Specialist

## 2020-01-26 NOTE — Telephone Encounter (Signed)
Patient called in today. Seeking records from 2000's. Upon checking, advised no records in [facility], found OP note (e-conversion)on lumbar spine which is what she specifically needs. I've printed, she is coming to sign release and pick up op note.

## 2020-01-27 DIAGNOSIS — M25562 Pain in left knee: Secondary | ICD-10-CM | POA: Diagnosis not present

## 2020-02-02 ENCOUNTER — Encounter: Payer: Self-pay | Admitting: Family Medicine

## 2020-02-04 DIAGNOSIS — M25561 Pain in right knee: Secondary | ICD-10-CM | POA: Diagnosis not present

## 2020-02-08 ENCOUNTER — Telehealth: Payer: Self-pay | Admitting: Family Medicine

## 2020-02-08 NOTE — Telephone Encounter (Signed)
FYI

## 2020-02-08 NOTE — Telephone Encounter (Signed)
Pt has been scheduled for a pre-op visit with Tabori, I have placed the pw in the file cabnet upfront.

## 2020-02-17 ENCOUNTER — Ambulatory Visit (INDEPENDENT_AMBULATORY_CARE_PROVIDER_SITE_OTHER): Payer: Medicare Other | Admitting: Family Medicine

## 2020-02-17 ENCOUNTER — Encounter: Payer: Self-pay | Admitting: Family Medicine

## 2020-02-17 ENCOUNTER — Other Ambulatory Visit: Payer: Self-pay

## 2020-02-17 VITALS — BP 122/80 | HR 64 | Temp 97.9°F | Resp 16 | Ht 63.0 in | Wt 153.2 lb

## 2020-02-17 DIAGNOSIS — Z01818 Encounter for other preprocedural examination: Secondary | ICD-10-CM | POA: Diagnosis not present

## 2020-02-17 DIAGNOSIS — Z23 Encounter for immunization: Secondary | ICD-10-CM

## 2020-02-17 DIAGNOSIS — R05 Cough: Secondary | ICD-10-CM | POA: Diagnosis not present

## 2020-02-17 DIAGNOSIS — R053 Chronic cough: Secondary | ICD-10-CM

## 2020-02-17 LAB — CBC WITH DIFFERENTIAL/PLATELET
Basophils Absolute: 0.1 10*3/uL (ref 0.0–0.1)
Basophils Relative: 1.3 % (ref 0.0–3.0)
Eosinophils Absolute: 0.4 10*3/uL (ref 0.0–0.7)
Eosinophils Relative: 6.1 % — ABNORMAL HIGH (ref 0.0–5.0)
HCT: 37.8 % (ref 36.0–46.0)
Hemoglobin: 12.5 g/dL (ref 12.0–15.0)
Lymphocytes Relative: 25.7 % (ref 12.0–46.0)
Lymphs Abs: 1.5 10*3/uL (ref 0.7–4.0)
MCHC: 33.1 g/dL (ref 30.0–36.0)
MCV: 99.9 fl (ref 78.0–100.0)
Monocytes Absolute: 0.5 10*3/uL (ref 0.1–1.0)
Monocytes Relative: 9.2 % (ref 3.0–12.0)
Neutro Abs: 3.4 10*3/uL (ref 1.4–7.7)
Neutrophils Relative %: 57.7 % (ref 43.0–77.0)
Platelets: 237 10*3/uL (ref 150.0–400.0)
RBC: 3.78 Mil/uL — ABNORMAL LOW (ref 3.87–5.11)
RDW: 13.9 % (ref 11.5–15.5)
WBC: 5.8 10*3/uL (ref 4.0–10.5)

## 2020-02-17 LAB — BASIC METABOLIC PANEL
BUN: 16 mg/dL (ref 6–23)
CO2: 27 mEq/L (ref 19–32)
Calcium: 9.4 mg/dL (ref 8.4–10.5)
Chloride: 106 mEq/L (ref 96–112)
Creatinine, Ser: 0.92 mg/dL (ref 0.40–1.20)
GFR: 61.05 mL/min (ref >60.00)
Glucose, Bld: 92 mg/dL (ref 70–99)
Potassium: 3.7 mEq/L (ref 3.5–5.1)
Sodium: 140 mEq/L (ref 135–145)

## 2020-02-17 LAB — TSH: TSH: 7 u[IU]/mL — ABNORMAL HIGH (ref 0.35–4.50)

## 2020-02-17 LAB — LIPID PANEL
Cholesterol: 173 mg/dL (ref 0–200)
HDL: 50.7 mg/dL (ref >39.00)
NonHDL: 122.08
Total CHOL/HDL Ratio: 3
Triglycerides: 231 mg/dL — ABNORMAL HIGH (ref 0.0–149.0)
VLDL: 46.2 mg/dL — ABNORMAL HIGH (ref 0.0–40.0)

## 2020-02-17 LAB — HEPATIC FUNCTION PANEL
ALT: 33 U/L (ref 0–35)
AST: 34 U/L (ref 0–37)
Albumin: 4.4 g/dL (ref 3.5–5.2)
Alkaline Phosphatase: 123 U/L — ABNORMAL HIGH (ref 39–117)
Bilirubin, Direct: 0.1 mg/dL (ref 0.0–0.3)
Total Bilirubin: 0.4 mg/dL (ref 0.2–1.2)
Total Protein: 7 g/dL (ref 6.0–8.3)

## 2020-02-17 LAB — LDL CHOLESTEROL, DIRECT: Direct LDL: 78 mg/dL

## 2020-02-17 MED ORDER — PANTOPRAZOLE SODIUM 40 MG PO TBEC: 40.0000 mg | DELAYED_RELEASE_TABLET | Freq: Every day | ORAL | 1 refills | Status: DC

## 2020-02-17 NOTE — Progress Notes (Signed)
Subjective:    Becky Boyd is a 64 y.o. female who presents to the office today for a preoperative consultation at the request of surgeon Dr Shelle Iron who plans on performing L arthroscopic knee surgery on TBD  . This consultation is requested for the specific conditions prompting preoperative evaluation (i.e. because of potential affect on operative risk): none. Planned anesthesia: general. The patient has the following known anesthesia issues: no hx of complications. Patients bleeding risk: no recent abnormal bleeding, no remote history of abnormal bleeding and no use of Ca-channel blockers. Patient does not have objections to receiving blood products if needed.  The following portions of the patient's history were reviewed and updated as appropriate: allergies, current medications, past family history, past medical history, past social history, past surgical history and problem list.  Review of Systems A comprehensive review of systems was negative.    Objective:    BP 122/80   Pulse 64   Temp 97.9 F (36.6 C) (Skin)   Resp 16   Ht 5\' 3"  (1.6 m)   Wt 153 lb 4 oz (69.5 kg)   SpO2 98%   BMI 27.15 kg/m   General Appearance:    Alert, cooperative, no distress, appears stated age  Head:    Normocephalic, without obvious abnormality, atraumatic  Eyes:    PERRL, conjunctiva/corneas clear, EOM's intact, fundi    benign, both eyes  Ears:    Normal TM's and external ear canals, both ears  Nose:   Nares normal, septum midline, mucosa normal, no drainage    or sinus tenderness  Throat:   Lips, mucosa, and tongue normal  Neck:   Supple, symmetrical, trachea midline, no adenopathy;    thyroid:  no enlargement/tenderness/nodules  Back:     Symmetric, no curvature, ROM normal, no CVA tenderness  Lungs:     Clear to auscultation bilaterally, respirations unlabored  Chest Wall:    No tenderness or deformity   Heart:    Regular rate and rhythm, S1 and S2 normal, no murmur, rub   or gallop  Breast  Exam:    Deferred  Abdomen:     Soft, non-tender, bowel sounds active all four quadrants,    no masses, no organomegaly  Genitalia:    deferred  Rectal:    Extremities:   Extremities normal, atraumatic, no cyanosis or edema  Pulses:   2+ and symmetric all extremities  Skin:   Skin color, texture, turgor normal, no rashes or lesions  Lymph nodes:   Cervical, supraclavicular, and axillary nodes normal  Neurologic:   CNII-XII intact, normal strength, sensation and reflexes    throughout    Predictors of intubation difficulty:  Morbid obesity? no  Anatomically abnormal facies? no  Prominent incisors? no  Receding mandible? no  Short, thick neck? no  Neck range of motion: normal  Dentition: pt has 'maybe 10 teeth left'  Cardiographics ECG: no change since previous ECG dated 2015 Echocardiogram: not done  Imaging Chest x-ray: NA   Lab Review  Pending     Assessment:      64 y.o. female with planned surgery as above.   Known risk factors for perioperative complications: None   Difficulty with intubation is not anticipated.  Cardiac Risk Estimation: low  Current medications which may produce withdrawal symptoms if withheld perioperatively: Cymbalta     Plan:    1. Preoperative workup as follows ECG, hemoglobin, hematocrit, electrolytes, creatinine, glucose, liver function studies. 2. Change in medication regimen before surgery: discontinue  NSAIDs (meloxicam) 14 days before surgery. 3. Prophylaxis for cardiac events with perioperative beta-blockers: not indicated. 4. Invasive hemodynamic monitoring perioperatively: at the discretion of anesthesiologist. 5. Deep vein thrombosis prophylaxis postoperatively:regimen to be chosen by surgical team. 6. Surveillance for postoperative MI with ECG immediately postoperatively and on postoperative days 1 and 2 AND troponin levels 24 hours postoperatively and on day 4 or hospital discharge (whichever comes first): at the discretion of  anesthesiologist. 7. Other measures: none

## 2020-02-17 NOTE — Patient Instructions (Signed)
Follow up as needed or as scheduled We'll notify you of your lab results and make any changes if needed Consider a COVID booster b/c you qualify Call with any questions or concerns GOOD LUCK WITH SURGERY!!!!

## 2020-02-20 ENCOUNTER — Encounter: Payer: Self-pay | Admitting: Family Medicine

## 2020-02-21 ENCOUNTER — Other Ambulatory Visit: Payer: Self-pay | Admitting: General Practice

## 2020-02-21 DIAGNOSIS — E038 Other specified hypothyroidism: Secondary | ICD-10-CM

## 2020-02-21 MED ORDER — LEVOTHYROXINE SODIUM 88 MCG PO TABS: 88.0000 ug | ORAL_TABLET | Freq: Every day | ORAL | 3 refills | Status: DC

## 2020-02-23 DIAGNOSIS — M25561 Pain in right knee: Secondary | ICD-10-CM | POA: Diagnosis not present

## 2020-02-24 DIAGNOSIS — S83249A Other tear of medial meniscus, current injury, unspecified knee, initial encounter: Secondary | ICD-10-CM | POA: Insufficient documentation

## 2020-02-24 DIAGNOSIS — S83241D Other tear of medial meniscus, current injury, right knee, subsequent encounter: Secondary | ICD-10-CM | POA: Diagnosis not present

## 2020-02-29 NOTE — Telephone Encounter (Signed)
Faxed and sent to scanning

## 2020-03-07 ENCOUNTER — Other Ambulatory Visit: Payer: Self-pay | Admitting: Family Medicine

## 2020-03-08 ENCOUNTER — Other Ambulatory Visit: Payer: Self-pay | Admitting: Family Medicine

## 2020-03-09 NOTE — Telephone Encounter (Signed)
Please advise if ok to refill? Last filled 01/21/20 #90 with 0

## 2020-03-10 DIAGNOSIS — M0579 Rheumatoid arthritis with rheumatoid factor of multiple sites without organ or systems involvement: Secondary | ICD-10-CM | POA: Diagnosis not present

## 2020-03-10 DIAGNOSIS — M359 Systemic involvement of connective tissue, unspecified: Secondary | ICD-10-CM | POA: Diagnosis not present

## 2020-03-10 DIAGNOSIS — M797 Fibromyalgia: Secondary | ICD-10-CM | POA: Diagnosis not present

## 2020-03-10 DIAGNOSIS — M5136 Other intervertebral disc degeneration, lumbar region: Secondary | ICD-10-CM | POA: Diagnosis not present

## 2020-03-10 DIAGNOSIS — Z79899 Other long term (current) drug therapy: Secondary | ICD-10-CM | POA: Diagnosis not present

## 2020-03-14 ENCOUNTER — Other Ambulatory Visit: Payer: Self-pay | Admitting: Family Medicine

## 2020-03-27 DIAGNOSIS — G8918 Other acute postprocedural pain: Secondary | ICD-10-CM | POA: Diagnosis not present

## 2020-03-27 DIAGNOSIS — M948X6 Other specified disorders of cartilage, lower leg: Secondary | ICD-10-CM | POA: Diagnosis not present

## 2020-03-27 DIAGNOSIS — S83231A Complex tear of medial meniscus, current injury, right knee, initial encounter: Secondary | ICD-10-CM | POA: Diagnosis not present

## 2020-03-27 DIAGNOSIS — M1711 Unilateral primary osteoarthritis, right knee: Secondary | ICD-10-CM | POA: Diagnosis not present

## 2020-04-05 DIAGNOSIS — Z4889 Encounter for other specified surgical aftercare: Secondary | ICD-10-CM | POA: Diagnosis not present

## 2020-04-15 IMAGING — US US CAROTID DUPLEX BILAT
1 series · 13 of 24 positions shown · non-contrast
Comparison: None.

CLINICAL DATA: 63-year-old female with right neck pain radiating to
the jaw. Diagnosed with COVID September 2018.

EXAM:
BILATERAL CAROTID DUPLEX ULTRASOUND
TECHNIQUE: Gray scale imaging, color Doppler and duplex ultrasound were
performed of bilateral carotid and vertebral arteries in the neck.

[Series 1: us carotid duplex bilat · 13 of 72 slices shown]
[im 1/72]
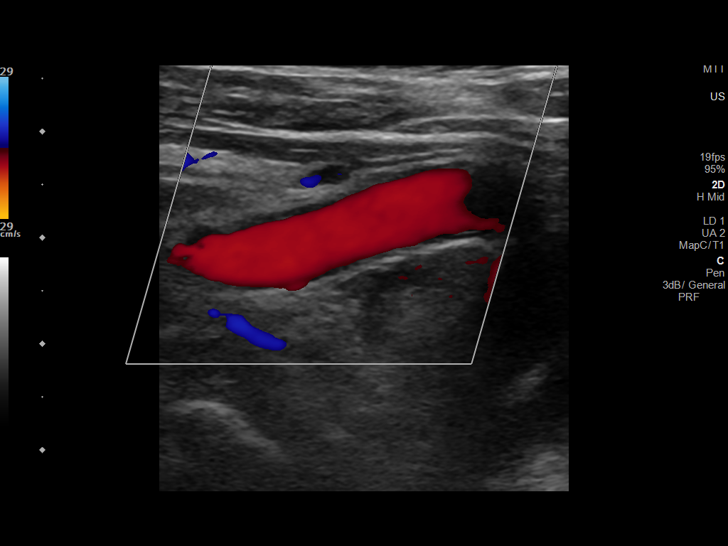
[im 7/72]
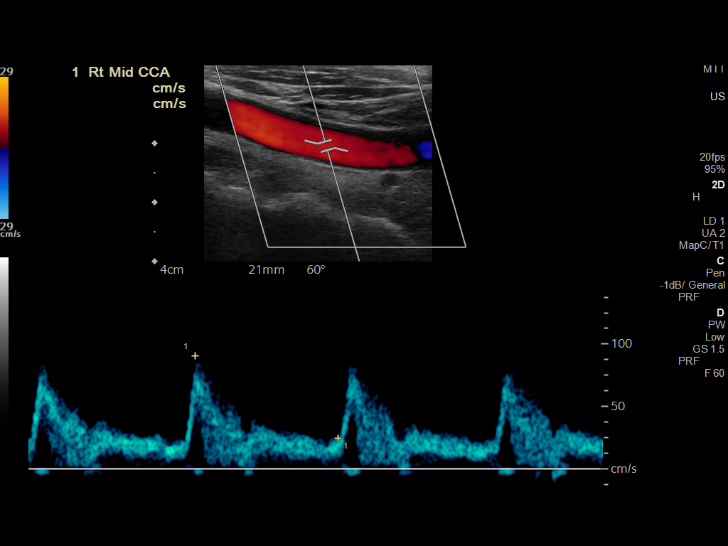
[im 13/72]
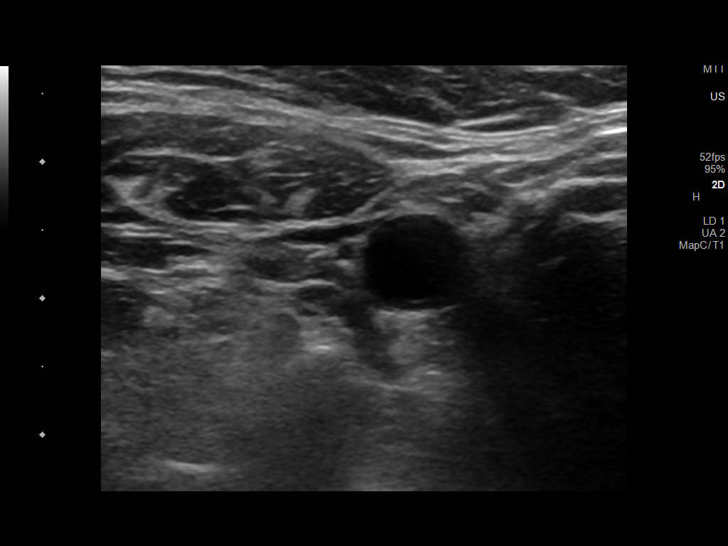
[im 19/72]
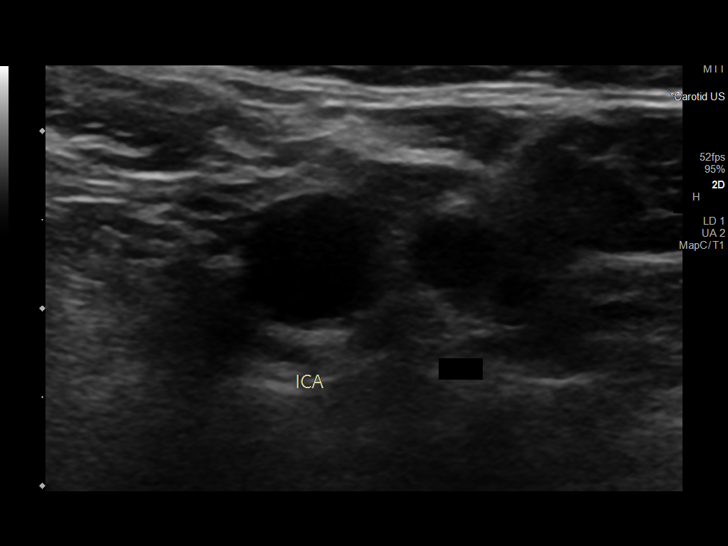
[im 25/72]
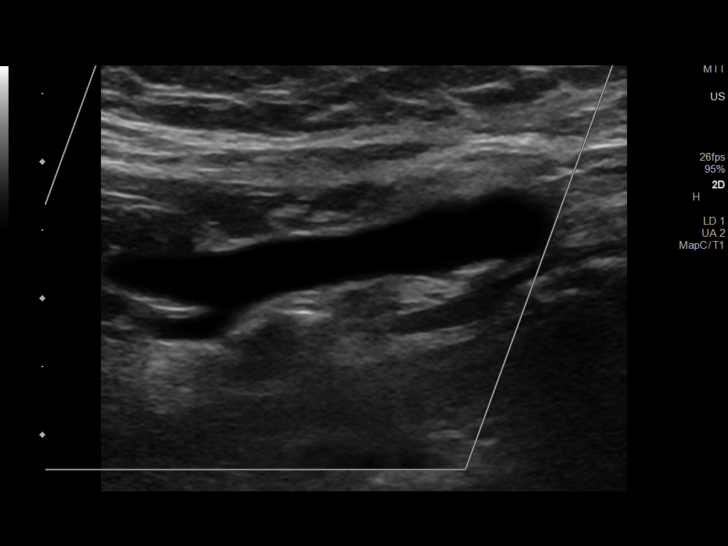
[im 31/72]
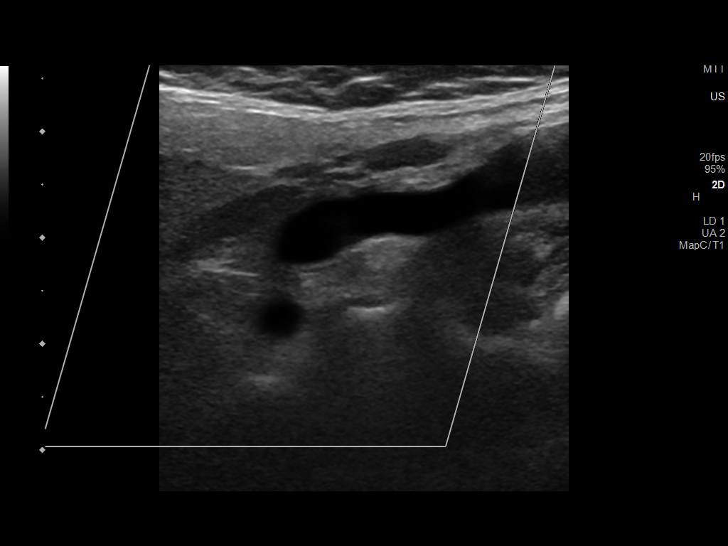
[im 38/72]
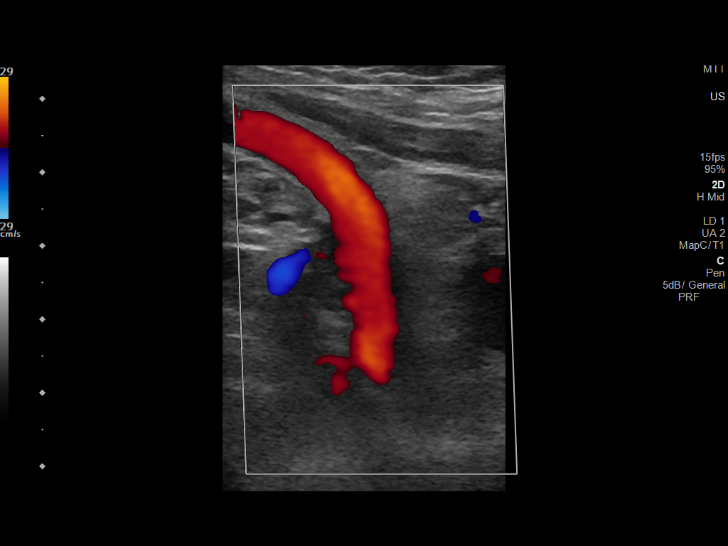
[im 41/72]
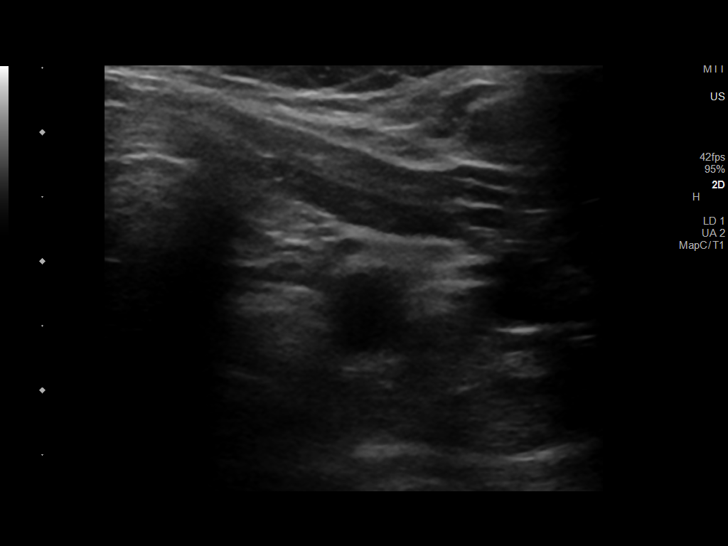
[im 47/72]
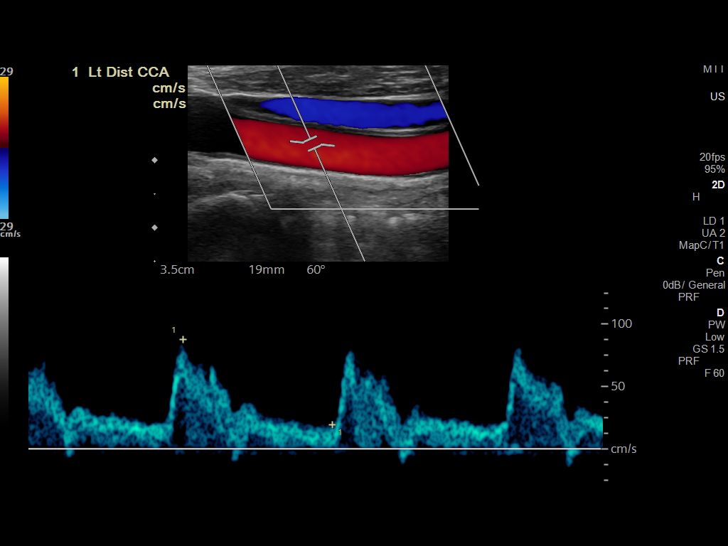
[im 53/72]
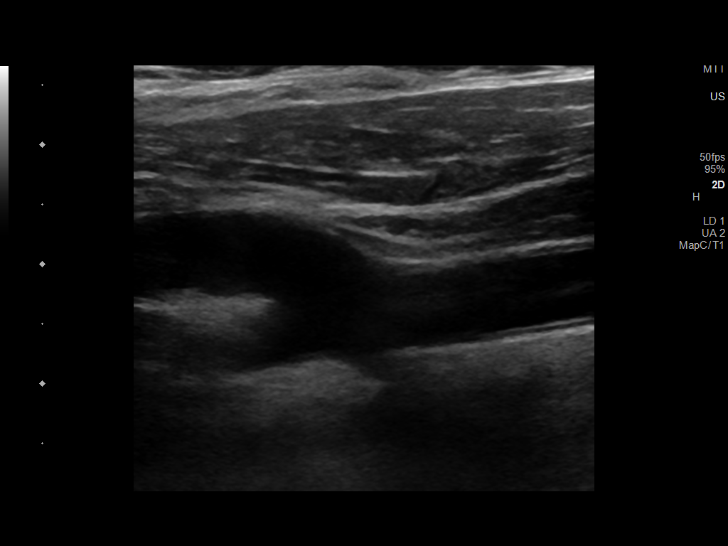
[im 59/72]
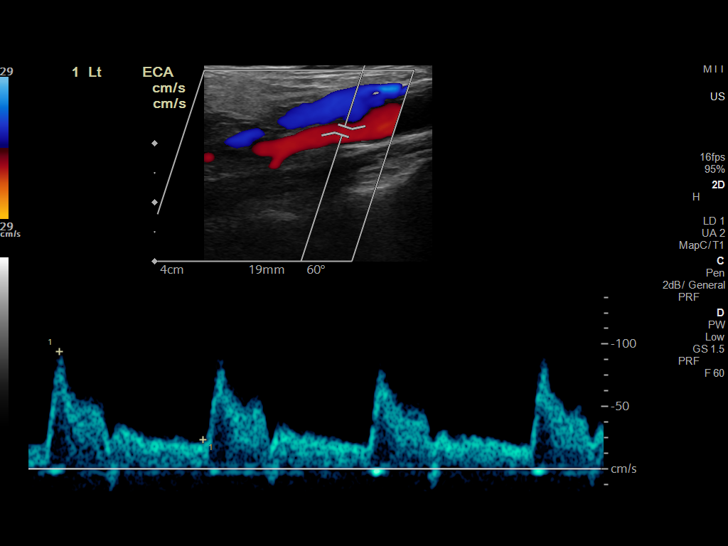
[im 65/72]
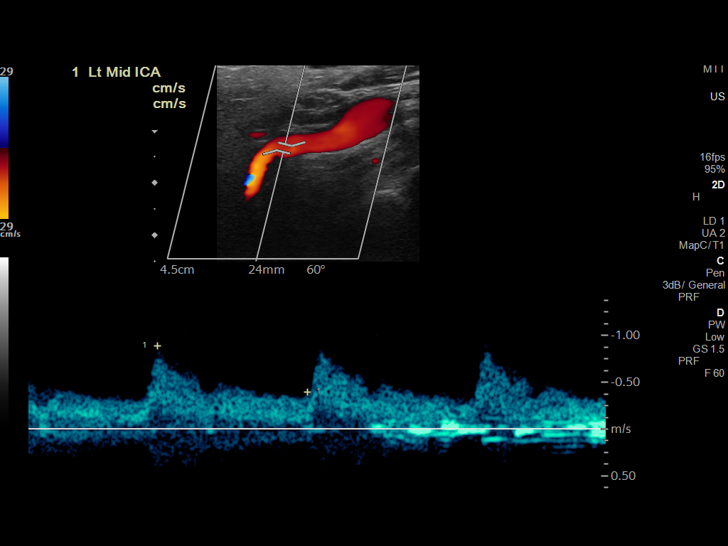
[im 72/72]
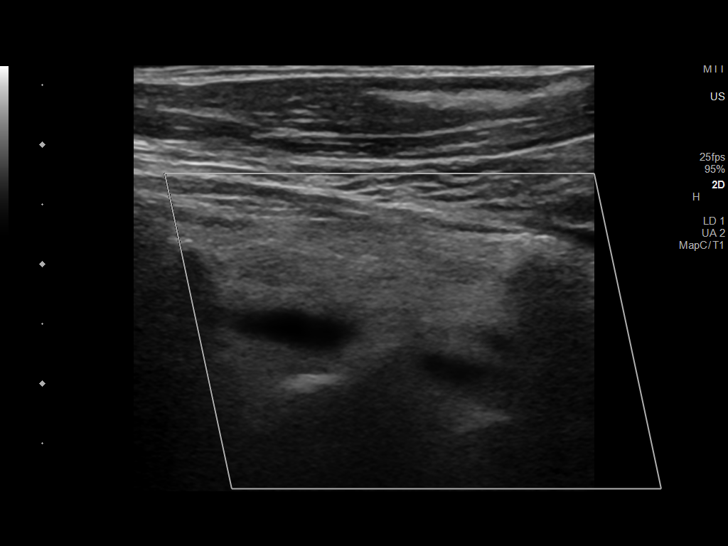

[13 of 24 positions shown; findings below may reference images not displayed]

FINDINGS: Criteria: Quantification of carotid stenosis is based on velocity
parameters that correlate the residual internal carotid diameter
with NASCET-based stenosis levels, using the diameter of the distal
internal carotid lumen as the denominator for stenosis measurement.

The following velocity measurements were obtained:

RIGHT
ICA: 113/40 cm/sec
CCA: 91/30 cm/sec

SYSTOLIC ICA/CCA RATIO:

ECA:  80 cm/sec

LEFT

ICA: 89/40 cm/sec

CCA: 134/36 cm/sec

SYSTOLIC ICA/CCA RATIO:

ECA:  60 cm/sec

RIGHT CAROTID ARTERY: No significant atherosclerotic plaque or
stenosis.

RIGHT VERTEBRAL ARTERY:  Patent with normal antegrade flow.

LEFT CAROTID ARTERY: No significant atherosclerotic plaque or
stenosis.

LEFT VERTEBRAL ARTERY:  Patent with normal antegrade flow.
IMPRESSION: Normal examination. No evidence of significant atherosclerotic
plaque or stenosis in either internal carotid artery.

## 2020-04-17 DIAGNOSIS — M25561 Pain in right knee: Secondary | ICD-10-CM | POA: Diagnosis not present

## 2020-04-19 DIAGNOSIS — M25561 Pain in right knee: Secondary | ICD-10-CM | POA: Diagnosis not present

## 2020-05-01 DIAGNOSIS — M25561 Pain in right knee: Secondary | ICD-10-CM | POA: Diagnosis not present

## 2020-05-08 DIAGNOSIS — M25561 Pain in right knee: Secondary | ICD-10-CM | POA: Diagnosis not present

## 2020-05-16 ENCOUNTER — Ambulatory Visit: Payer: Self-pay | Admitting: Orthopedic Surgery

## 2020-05-17 ENCOUNTER — Ambulatory Visit: Payer: Medicare Other | Admitting: Internal Medicine

## 2020-05-29 ENCOUNTER — Encounter: Payer: Self-pay | Admitting: Family Medicine

## 2020-06-05 ENCOUNTER — Ambulatory Visit: Payer: Self-pay | Admitting: Orthopedic Surgery

## 2020-06-05 NOTE — H&P (Signed)
Becky Boyd is an 65 y.o. female.   Chief Complaint: L knee pain HPI: The patient is here today for her H&P. She is scheduled 06/09/2020 for a left total knee arthroplasty.  She reports ongoing left knee pain refractory to injection therapy, bracing, medications, quadriceps strengthening, activity modification and relative rest. Pain and instability as well as swelling are all interfering with quality of life and activities of daily living at this point and she desires to proceed with a knee replacement.  Dr. Shelle Iron and the patient mutually agreed to proceed with a total knee replacement. Risks and benefits of the procedure were discussed including stiffness, suboptimal range of motion, persistent pain, infection requiring removal of prosthesis and reinsertion, need for prophylactic antibiotics in the future, for example, dental procedures, possible need for manipulation, revision in the future and also anesthetic complications including DVT, PE, etc. We discussed the perioperative course, time in the hospital, postoperative recovery and the need for elevation to control swelling. We also discussed the predicted range of motion and the probability that squatting and kneeling would be unobtainable in the future. In addition, postoperative anticoagulation was discussed. We have obtained preoperative medical clearance as necessary. Provided illustrated handout and discussed it in detail. They will enroll in the total joint replacement educational forum at the hospital.  Past Medical History:  Diagnosis Date  . Aneurysm (HCC)   . Anorexia   . Dysphagia   . Fibromyalgia   . Fibromyalgia   . Hashimoto thyroiditis   . Hypothyroidism   . Kidney stones   . Lupus (HCC)   . Migraines   . Rheumatoid arthritis (HCC)   . Sjogren's disease [facility])     Past Surgical History:  Procedure Laterality Date  . ABDOMINAL HYSTERECTOMY    . CERVICAL FUSION  2004   C4-C5 with Instrumentation  . CERVICAL FUSION  1990    C3-C4  . cervical rods    . CHOLECYSTECTOMY    . kidney stone    . l5-s1 surgery    . LUMBAR MICRODISCECTOMY  2004   Bilateral L5-S1    Family History  Problem Relation Age of Onset  . Diabetes Sister   . Breast cancer Paternal Aunt   . COPD Mother   . Heart disease Father   . COPD Brother   . Aneurysm Paternal Aunt   . Diabetes Other    Social History:  reports that she has never smoked. She has never used smokeless tobacco. She reports that she does not drink alcohol and does not use drugs.  Allergies:  Allergies  Allergen Reactions  . Plaquenil [Hydroxychloroquine Sulfate] Other (See Comments)    Prolonged QT interval, skin feels like bee stings   Medications: atorvastatin baclofen levothyroxine meloxicam methocarbamoL 500 mg tablet pantoprazole Percocet 5 mg-325 mg tablet predniSONE sulfaSALAzine 500 mg tablet  Review of Systems  Constitutional: Negative.   HENT: Negative.   Eyes: Negative.   Respiratory: Negative.   Cardiovascular: Negative.   Gastrointestinal: Negative.   Endocrine: Negative.   Genitourinary: Negative.   Musculoskeletal: Positive for arthralgias and joint swelling.  Skin: Negative.   Neurological: Negative.   Psychiatric/Behavioral: Negative.     There were no vitals taken for this visit. Physical Exam HENT:     Head: Normocephalic.     Right Ear: External ear normal.     Left Ear: External ear normal.     Nose: Nose normal.     Mouth/Throat:     Pharynx: Oropharynx is clear.  Cardiovascular:     Rate and Rhythm: Normal rate.     Pulses: Normal pulses.  Pulmonary:     Effort: Pulmonary effort is normal.  Abdominal:     General: Bowel sounds are normal.  Musculoskeletal:     Cervical back: Normal range of motion.     Comments: Patient is a 65 year old female.  Left knee tender medial joint line. Patellofemoral pain compression. Walks an antalgic gait. Range 0 120. Right knee no evidence of infection. Wounds are  well-healed no DVT. Tender medial joint line.  Skin:    General: Skin is warm and dry.  Neurological:     Mental Status: She is alert.      Assessment/Plan Impression: End-stage left knee osteoarthritis  Plan:Pt with end-stage left knee DJD, bone-on-bone, refractory to conservative tx, scheduled for left total knee replacement by Dr. Shelle Iron on January 7. We again discussed the procedure itself as well as risks, complications and alternatives, including but not limited to DVT, PE, infx, bleeding, failure of procedure, need for secondary procedure including manipulation, nerve injury, ongoing pain/symptoms, anesthesia risk, even stroke or death. Also discussed typical post-op protocols, activity restrictions, need for PT, flexion/extension exercises, time out of work. Discussed need for DVT ppx post-op per protocol. Discussed dental ppx and infx prevention. Also discussed limitations post-operatively such as kneeling and squatting. All questions were answered. Patient desires to proceed with surgery as scheduled.  Will hold supplements, ASA and NSAIDs accordingly. Will remain NPO after midnight the night before surgery. Will present to [facility] for pre-op testing. Anticipate hospital stay to include at least 2 midnights given medical history and to ensure proper pain control. Plan aspirin 81 mg twice a day for DVT ppx post-op. Plan Percocet, Robaxin, Colace, Miralax. Plan to return home with her husband there for assistance, start outpatient PT at our office the week following surgery. Will follow up 10-14 days post-op for suture removal and xrays.  Plan: Left total knee replacement  Dorothy Spark, PA-C for Dr. Shelle Iron 06/05/2020, 11:45 AM

## 2020-06-05 NOTE — H&P (View-Only) (Signed)
Becky Boyd is an 65 y.o. female.   Chief Complaint: L knee pain HPI: The patient is here today for her H&P. She is scheduled 06/09/2020 for a left total knee arthroplasty.  She reports ongoing left knee pain refractory to injection therapy, bracing, medications, quadriceps strengthening, activity modification and relative rest. Pain and instability as well as swelling are all interfering with quality of life and activities of daily living at this point and she desires to proceed with a knee replacement.  Dr. Shelle Iron and the patient mutually agreed to proceed with a total knee replacement. Risks and benefits of the procedure were discussed including stiffness, suboptimal range of motion, persistent pain, infection requiring removal of prosthesis and reinsertion, need for prophylactic antibiotics in the future, for example, dental procedures, possible need for manipulation, revision in the future and also anesthetic complications including DVT, PE, etc. We discussed the perioperative course, time in the hospital, postoperative recovery and the need for elevation to control swelling. We also discussed the predicted range of motion and the probability that squatting and kneeling would be unobtainable in the future. In addition, postoperative anticoagulation was discussed. We have obtained preoperative medical clearance as necessary. Provided illustrated handout and discussed it in detail. They will enroll in the total joint replacement educational forum at the hospital.  Past Medical History:  Diagnosis Date  . Aneurysm (HCC)   . Anorexia   . Dysphagia   . Fibromyalgia   . Fibromyalgia   . Hashimoto thyroiditis   . Hypothyroidism   . Kidney stones   . Lupus (HCC)   . Migraines   . Rheumatoid arthritis (HCC)   . Sjogren's disease [facility])     Past Surgical History:  Procedure Laterality Date  . ABDOMINAL HYSTERECTOMY    . CERVICAL FUSION  2004   C4-C5 with Instrumentation  . CERVICAL FUSION  1990    C3-C4  . cervical rods    . CHOLECYSTECTOMY    . kidney stone    . l5-s1 surgery    . LUMBAR MICRODISCECTOMY  2004   Bilateral L5-S1    Family History  Problem Relation Age of Onset  . Diabetes Sister   . Breast cancer Paternal Aunt   . COPD Mother   . Heart disease Father   . COPD Brother   . Aneurysm Paternal Aunt   . Diabetes Other    Social History:  reports that she has never smoked. She has never used smokeless tobacco. She reports that she does not drink alcohol and does not use drugs.  Allergies:  Allergies  Allergen Reactions  . Plaquenil [Hydroxychloroquine Sulfate] Other (See Comments)    Prolonged QT interval, skin feels like bee stings   Medications: atorvastatin baclofen levothyroxine meloxicam methocarbamoL 500 mg tablet pantoprazole Percocet 5 mg-325 mg tablet predniSONE sulfaSALAzine 500 mg tablet  Review of Systems  Constitutional: Negative.   HENT: Negative.   Eyes: Negative.   Respiratory: Negative.   Cardiovascular: Negative.   Gastrointestinal: Negative.   Endocrine: Negative.   Genitourinary: Negative.   Musculoskeletal: Positive for arthralgias and joint swelling.  Skin: Negative.   Neurological: Negative.   Psychiatric/Behavioral: Negative.     There were no vitals taken for this visit. Physical Exam HENT:     Head: Normocephalic.     Right Ear: External ear normal.     Left Ear: External ear normal.     Nose: Nose normal.     Mouth/Throat:     Pharynx: Oropharynx is clear.  Cardiovascular:     Rate and Rhythm: Normal rate.     Pulses: Normal pulses.  Pulmonary:     Effort: Pulmonary effort is normal.  Abdominal:     General: Bowel sounds are normal.  Musculoskeletal:     Cervical back: Normal range of motion.     Comments: Patient is a 65-year-old female.  Left knee tender medial joint line. Patellofemoral pain compression. Walks an antalgic gait. Range 0 120. Right knee no evidence of infection. Wounds are  well-healed no DVT. Tender medial joint line.  Skin:    General: Skin is warm and dry.  Neurological:     Mental Status: She is alert.      Assessment/Plan Impression: End-stage left knee osteoarthritis  Plan:Pt with end-stage left knee DJD, bone-on-bone, refractory to conservative tx, scheduled for left total knee replacement by Dr. Beane on January 7. We again discussed the procedure itself as well as risks, complications and alternatives, including but not limited to DVT, PE, infx, bleeding, failure of procedure, need for secondary procedure including manipulation, nerve injury, ongoing pain/symptoms, anesthesia risk, even stroke or death. Also discussed typical post-op protocols, activity restrictions, need for PT, flexion/extension exercises, time out of work. Discussed need for DVT ppx post-op per protocol. Discussed dental ppx and infx prevention. Also discussed limitations post-operatively such as kneeling and squatting. All questions were answered. Patient desires to proceed with surgery as scheduled.  Will hold supplements, ASA and NSAIDs accordingly. Will remain NPO after midnight the night before surgery. [facility] present to [facility] for pre-op testing. Anticipate hospital stay to include at least 2 midnights given medical history and to ensure proper pain control. Plan aspirin 81 mg twice a day for DVT ppx post-op. Plan Percocet, Robaxin, Colace, Miralax. Plan to return home with her husband there for assistance, start outpatient PT at our office the week following surgery. Will follow up 10-14 days post-op for suture removal and xrays.  Plan: Left total knee replacement  Alyan Hartline M Nichlas Pitera, PA-C for Dr. Beane 06/05/2020, 11:45 AM   

## 2020-06-05 NOTE — Progress Notes (Signed)
DUE TO COVID-19 ONLY ONE VISITOR IS ALLOWED TO COME WITH YOU AND STAY IN THE WAITING ROOM ONLY DURING PRE OP AND PROCEDURE DAY OF SURGERY. THE 1 VISITOR  MAY VISIT WITH YOU AFTER SURGERY IN YOUR PRIVATE ROOM DURING VISITING HOURS ONLY!  YOU NEED TO HAVE A COVID 19 TEST ON__1/09/2020 _____ @___230pm____ , THIS TEST MUST BE DONE BEFORE SURGERY,  COVID TESTING SITE [address] [place] , IT IS ON THE RIGHT GOING OUT [address] APPROXIMATELY  2 MINUTES PAST [employer] ON THE RIGHT. ONCE YOUR COVID TEST IS COMPLETED,  PLEASE BEGIN THE QUARANTINE INSTRUCTIONS AS OUTLINED IN YOUR HANDOUT.                Breeanna Galgano  06/05/2020   Your procedure is scheduled on: 06/09/2020    Report to [facility] Main  Entrance   Report to admitting at      1000AM     Call this number if you have problems the morning of surgery [phone]    REMEMBER: NO  SOLID FOOD CANDY OR GUM AFTER MIDNIGHT. CLEAR LIQUIDS UNTIL    0930am      . NOTHING BY MOUTH EXCEPT CLEAR LIQUIDS UNTIL    . PLEASE FINISH ENSURE DRINK PER SURGEON ORDER  WHICH NEEDS TO BE COMPLETED AT   0930am   .      CLEAR LIQUID DIET   Foods Allowed                                                                    Coffee and tea, regular and decaf                            Fruit ices (not with fruit pulp)                                      Iced Popsicles                                    Carbonated beverages, regular and diet                                    Cranberry, grape and apple juices Sports drinks like Gatorade Lightly seasoned clear broth or consume(fat free) Sugar, honey syrup ___________________________________________________________________      BRUSH YOUR TEETH MORNING OF SURGERY AND RINSE YOUR MOUTH OUT, NO CHEWING GUM CANDY OR MINTS.     Take these medicines the morning of surgery with A SIP OF WATER: Synthroid, Protonix   DO NOT TAKE ANY DIABETIC MEDICATIONS DAY OF YOUR  SURGERY                               You may not have any metal on your body including hair pins and              piercings  Do not  wear jewelry, make-up, lotions, powders or perfumes, deodorant             Do not wear nail polish on your fingernails.  Do not shave  48 hours prior to surgery.              Men may shave face and neck.   Do not bring valuables to the hospital. [place].  Contacts, dentures or bridgework may not be worn into surgery.  Leave suitcase in the car. After surgery it may be brought to your room.     Patients discharged the day of surgery will not be allowed to drive home. IF YOU ARE HAVING SURGERY AND GOING HOME THE SAME DAY, YOU MUST HAVE AN ADULT TO DRIVE YOU HOME AND BE WITH YOU FOR 24 HOURS. YOU MAY GO HOME BY TAXI OR UBER OR ORTHERWISE, BUT AN ADULT MUST ACCOMPANY YOU HOME AND STAY WITH YOU FOR 24 HOURS.  Name and phone number of your driver:  Special Instructions: N/A              Please read over the following fact sheets you were given: _____________________________________________________________________  [facility] - Preparing for Surgery Before surgery, you can play an important role.  Because skin is not sterile, your skin needs to be as free of germs as possible.  You can reduce the number of germs on your skin by washing with CHG (chlorahexidine gluconate) soap before surgery.  CHG is an antiseptic cleaner which kills germs and bonds with the skin to continue killing germs even after washing. Please DO NOT use if you have an allergy to CHG or antibacterial soaps.  If your skin becomes reddened/irritated stop using the CHG and inform your nurse when you arrive at Short Stay. Do not shave (including legs and underarms) for at least 48 hours prior to the first CHG shower.  You may shave your face/neck. Please follow these instructions carefully:  1.  Shower with CHG Soap the night before surgery and the   morning of Surgery.  2.  If you choose to wash your hair, wash your hair first as usual with your  normal  shampoo.  3.  After you shampoo, rinse your hair and body thoroughly to remove the  shampoo.                           4.  Use CHG as you would any other liquid soap.  You can apply chg directly  to the skin and wash                       Gently with a scrungie or clean washcloth.  5.  Apply the CHG Soap to your body ONLY FROM THE NECK DOWN.   Do not use on face/ open                           Wound or open sores. Avoid contact with eyes, ears mouth and genitals (private parts).                       Wash face,  Genitals (private parts) with your normal soap.             6.  Wash thoroughly, paying  special attention to the area where your surgery  will be performed.  7.  Thoroughly rinse your body with warm water from the neck down.  8.  DO NOT shower/wash with your normal soap after using and rinsing off  the CHG Soap.                9.  Pat yourself dry with a clean towel.            10.  Wear clean pajamas.            11.  Place clean sheets on your bed the night of your first shower and do not  sleep with pets. Day of Surgery : Do not apply any lotions/deodorants the morning of surgery.  Please wear clean clothes to the hospital/surgery center.  FAILURE TO FOLLOW THESE INSTRUCTIONS MAY RESULT IN THE CANCELLATION OF YOUR SURGERY PATIENT SIGNATURE_________________________________  NURSE SIGNATURE__________________________________  ________________________________________________________________________

## 2020-06-06 ENCOUNTER — Encounter (HOSPITAL_COMMUNITY)
Admission: RE | Admit: 2020-06-06 | Discharge: 2020-06-06 | Disposition: A | Discharge status: Home / Self Care | Payer: Medicare Other | Source: Ambulatory Visit | Attending: Specialist | Admitting: Specialist

## 2020-06-06 ENCOUNTER — Other Ambulatory Visit (HOSPITAL_COMMUNITY)
Admission: RE | Admit: 2020-06-06 | Discharge: 2020-06-06 | Disposition: A | Discharge status: Home / Self Care | Payer: Medicare Other | Source: Ambulatory Visit | Attending: Specialist | Admitting: Specialist

## 2020-06-06 ENCOUNTER — Encounter (HOSPITAL_COMMUNITY): Payer: Self-pay

## 2020-06-06 ENCOUNTER — Other Ambulatory Visit: Payer: Self-pay

## 2020-06-06 DIAGNOSIS — Z791 Long term (current) use of non-steroidal anti-inflammatories (NSAID): Secondary | ICD-10-CM | POA: Diagnosis not present

## 2020-06-06 DIAGNOSIS — Z20822 Contact with and (suspected) exposure to covid-19: Secondary | ICD-10-CM | POA: Insufficient documentation

## 2020-06-06 DIAGNOSIS — K219 Gastro-esophageal reflux disease without esophagitis: Secondary | ICD-10-CM | POA: Insufficient documentation

## 2020-06-06 DIAGNOSIS — Z96651 Presence of right artificial knee joint: Secondary | ICD-10-CM | POA: Diagnosis not present

## 2020-06-06 DIAGNOSIS — Z825 Family history of asthma and other chronic lower respiratory diseases: Secondary | ICD-10-CM | POA: Diagnosis not present

## 2020-06-06 DIAGNOSIS — R400 Somnolence: Secondary | ICD-10-CM | POA: Diagnosis not present

## 2020-06-06 DIAGNOSIS — M069 Rheumatoid arthritis, unspecified: Secondary | ICD-10-CM | POA: Insufficient documentation

## 2020-06-06 DIAGNOSIS — Z981 Arthrodesis status: Secondary | ICD-10-CM | POA: Diagnosis not present

## 2020-06-06 DIAGNOSIS — Z87442 Personal history of urinary calculi: Secondary | ICD-10-CM | POA: Diagnosis not present

## 2020-06-06 DIAGNOSIS — E063 Autoimmune thyroiditis: Secondary | ICD-10-CM | POA: Diagnosis not present

## 2020-06-06 DIAGNOSIS — G8918 Other acute postprocedural pain: Secondary | ICD-10-CM | POA: Diagnosis not present

## 2020-06-06 DIAGNOSIS — Z79890 Hormone replacement therapy (postmenopausal): Secondary | ICD-10-CM | POA: Diagnosis not present

## 2020-06-06 DIAGNOSIS — M1712 Unilateral primary osteoarthritis, left knee: Secondary | ICD-10-CM | POA: Insufficient documentation

## 2020-06-06 DIAGNOSIS — E876 Hypokalemia: Secondary | ICD-10-CM | POA: Diagnosis not present

## 2020-06-06 DIAGNOSIS — M797 Fibromyalgia: Secondary | ICD-10-CM | POA: Diagnosis not present

## 2020-06-06 DIAGNOSIS — E785 Hyperlipidemia, unspecified: Secondary | ICD-10-CM | POA: Diagnosis not present

## 2020-06-06 DIAGNOSIS — Z01812 Encounter for preprocedural laboratory examination: Secondary | ICD-10-CM | POA: Insufficient documentation

## 2020-06-06 DIAGNOSIS — Z8249 Family history of ischemic heart disease and other diseases of the circulatory system: Secondary | ICD-10-CM | POA: Diagnosis not present

## 2020-06-06 DIAGNOSIS — M3500 Sicca syndrome, unspecified: Secondary | ICD-10-CM | POA: Diagnosis not present

## 2020-06-06 DIAGNOSIS — M21162 Varus deformity, not elsewhere classified, left knee: Secondary | ICD-10-CM | POA: Diagnosis not present

## 2020-06-06 DIAGNOSIS — Z96652 Presence of left artificial knee joint: Secondary | ICD-10-CM | POA: Diagnosis not present

## 2020-06-06 DIAGNOSIS — Z7952 Long term (current) use of systemic steroids: Secondary | ICD-10-CM | POA: Diagnosis not present

## 2020-06-06 DIAGNOSIS — Z9049 Acquired absence of other specified parts of digestive tract: Secondary | ICD-10-CM | POA: Diagnosis not present

## 2020-06-06 DIAGNOSIS — Z79899 Other long term (current) drug therapy: Secondary | ICD-10-CM | POA: Insufficient documentation

## 2020-06-06 DIAGNOSIS — Z833 Family history of diabetes mellitus: Secondary | ICD-10-CM | POA: Diagnosis not present

## 2020-06-06 DIAGNOSIS — R0602 Shortness of breath: Secondary | ICD-10-CM | POA: Diagnosis not present

## 2020-06-06 DIAGNOSIS — Z9889 Other specified postprocedural states: Secondary | ICD-10-CM | POA: Diagnosis not present

## 2020-06-06 DIAGNOSIS — Z471 Aftercare following joint replacement surgery: Secondary | ICD-10-CM | POA: Diagnosis not present

## 2020-06-06 DIAGNOSIS — Z888 Allergy status to other drugs, medicaments and biological substances status: Secondary | ICD-10-CM | POA: Diagnosis not present

## 2020-06-06 DIAGNOSIS — R4182 Altered mental status, unspecified: Secondary | ICD-10-CM | POA: Diagnosis not present

## 2020-06-06 DIAGNOSIS — Z803 Family history of malignant neoplasm of breast: Secondary | ICD-10-CM | POA: Diagnosis not present

## 2020-06-06 DIAGNOSIS — Z8616 Personal history of COVID-19: Secondary | ICD-10-CM | POA: Diagnosis not present

## 2020-06-06 DIAGNOSIS — Z90710 Acquired absence of both cervix and uterus: Secondary | ICD-10-CM | POA: Diagnosis not present

## 2020-06-06 DIAGNOSIS — N390 Urinary tract infection, site not specified: Secondary | ICD-10-CM | POA: Diagnosis not present

## 2020-06-06 DIAGNOSIS — M329 Systemic lupus erythematosus, unspecified: Secondary | ICD-10-CM | POA: Diagnosis not present

## 2020-06-06 HISTORY — DX: Palpitations: R00.2

## 2020-06-06 HISTORY — DX: Personal history of urinary calculi: Z87.442

## 2020-06-06 HISTORY — DX: Gastro-esophageal reflux disease without esophagitis: K21.9

## 2020-06-06 LAB — URINALYSIS, ROUTINE W REFLEX MICROSCOPIC
Bilirubin Urine: NEGATIVE
Glucose, UA: NEGATIVE mg/dL
Hgb urine dipstick: NEGATIVE
Ketones, ur: NEGATIVE mg/dL
Leukocytes,Ua: NEGATIVE
Nitrite: NEGATIVE
Protein, ur: NEGATIVE mg/dL
Specific Gravity, Urine: 1.027 (ref 1.005–1.030)
pH: 5 (ref 5.0–8.0)

## 2020-06-06 LAB — BASIC METABOLIC PANEL
Anion gap: 10 (ref 5–15)
BUN: 20 mg/dL (ref 8–23)
CO2: 26 mmol/L (ref 22–32)
Calcium: 9.6 mg/dL (ref 8.9–10.3)
Chloride: 105 mmol/L (ref 98–111)
Creatinine, Ser: 0.81 mg/dL (ref 0.44–1.00)
GFR, Estimated: >60 mL/min (ref >60)
Glucose, Bld: 113 mg/dL — ABNORMAL HIGH (ref 70–99)
Potassium: 4 mmol/L (ref 3.5–5.1)
Sodium: 141 mmol/L (ref 135–145)

## 2020-06-06 LAB — CBC
HCT: 36.9 % (ref 36.0–46.0)
Hemoglobin: 11.6 g/dL — ABNORMAL LOW (ref 12.0–15.0)
MCH: 32.3 pg (ref 26.0–34.0)
MCHC: 31.4 g/dL (ref 30.0–36.0)
MCV: 102.8 fL — ABNORMAL HIGH (ref 80.0–100.0)
Platelets: 241 10*3/uL (ref 150–400)
RBC: 3.59 MIL/uL — ABNORMAL LOW (ref 3.87–5.11)
RDW: 13.5 % (ref 11.5–15.5)
WBC: 7.5 10*3/uL (ref 4.0–10.5)
nRBC: 0 % (ref 0.0–0.2)

## 2020-06-06 LAB — SURGICAL PCR SCREEN
MRSA, PCR: NEGATIVE
Staphylococcus aureus: NEGATIVE

## 2020-06-06 LAB — PROTIME-INR
INR: 1 (ref 0.8–1.2)
Prothrombin Time: 12.6 seconds (ref 11.4–15.2)

## 2020-06-06 LAB — APTT: aPTT: 29 seconds (ref 24–36)

## 2020-06-06 NOTE — Progress Notes (Addendum)
Anesthesia Review:  PCP: dr Beverely Low- 02/17/2020- PREOP EXAM  Cardiologist : Elmo Putt  11/25/19 - OFFICE VISIT WITH CARDIOLOGY  Pulm- LOV 08/23/19 DR Desai  Chest x-ray :08/23/19  2020- CAROTID S  EKG :02/17/20  Echo : 11/02/19 HEART [facility]- 01/30/20  Stress test: Cardiac Cath :  Activity level: CAN DO A FLIGHT OF STAIRS WITHOUT DIFFICULTY  Sleep Study/ CPAP : Fasting Blood Sugar :      / Checks Blood Sugar -- times a day:   Blood Thinner/ Instructions /Last Dose: ASA / Instructions/ Last Dose :  Had covid 09/2018 per pt in hosp x 3 weeks came home on 2l oxygen no longer uses oxygen

## 2020-06-07 LAB — SARS CORONAVIRUS 2 (TAT 6-24 HRS): SARS Coronavirus 2: NEGATIVE

## 2020-06-07 NOTE — Progress Notes (Signed)
Anesthesia Chart Review   Case: [id number] Date/Time: 06/09/20 1217   Procedure: TOTAL KNEE ARTHROPLASTY (Left Knee)   Anesthesia type: Spinal   Pre-op diagnosis: Left knee degenerative joint disease   Location: [facility] ROOM 10 / [facility]   Surgeons: Jene Every, MD      DISCUSSION:65 y.o. never smoker with h/o GERD, RA, Sjogren's, left knee djd scheduled for above procedure 06/09/2020 with Dr. Jene Every.   Pt seen by PCP 02/17/2020 for preoperative evaluation.  Per OV note, " 65 y.o. female with planned surgery as above.   Known risk factors for perioperative complications: None   Difficulty with intubation is not anticipated. Cardiac Risk Estimation: low Current medications which may produce withdrawal symptoms if withheld perioperatively: Cymbalta"  Anticipate pt can proceed with planned procedure barring acute status change.   VS: BP (!) 147/87   Pulse 80   Temp 36.6 C (Oral)   Resp 16   Ht 5\' 3"  (1.6 m)   Wt 69.4 kg   SpO2 94%   BMI 27.10 kg/m   PROVIDERS: , MD is PCP    LABS: Labs reviewed: Acceptable for surgery. (all labs ordered are listed, but only abnormal results are displayed)  Labs Reviewed  CBC - Abnormal; Notable for the following components:      Result Value   RBC 3.59 (*)    Hemoglobin 11.6 (*)    MCV 102.8 (*)    All other components within normal limits  BASIC METABOLIC PANEL - Abnormal; Notable for the following components:   Glucose, Bld 113 (*)    All other components within normal limits  URINALYSIS, ROUTINE W REFLEX MICROSCOPIC - Abnormal; Notable for the following components:   Color, Urine AMBER (*)    All other components within normal limits  SURGICAL PCR SCREEN  PROTIME-INR  APTT     IMAGES:   EKG: 02/17/2020 Rate 84 bpm  Old anterior infarct Unchanged from 2015 ekg  CV: Echo 11/02/2019 IMPRESSIONS    1. Left ventricular ejection fraction, by estimation, is 60 to 65%. The  left ventricle has normal  function. The left ventricle has no regional  wall motion abnormalities. Left ventricular diastolic parameters are  consistent with Grade I diastolic  dysfunction (impaired relaxation).  2. Right ventricular systolic function is normal. The right ventricular  size is normal.  3. The mitral valve is normal in structure. No evidence of mitral valve  regurgitation. No evidence of mitral stenosis.  4. The aortic valve is normal in structure. Aortic valve regurgitation is  not visualized. No aortic stenosis is present.  5. The inferior vena cava is normal in size with greater than 50%  respiratory variability, suggesting right atrial pressure of 3 mmHg.  Past Medical History:  Diagnosis Date  . Aneurysm (HCC)   . Anorexia   . Dysphagia   . Fibromyalgia   . Fibromyalgia   . GERD (gastroesophageal reflux disease)   . Hashimoto thyroiditis   . History of kidney stones    hx of multiple kidney stones   . Hypothyroidism   . Kidney stones   . Lupus (HCC)   . Palpitations   . Rheumatoid arthritis (HCC)   . Sjogren's disease [facility])     Past Surgical History:  Procedure Laterality Date  . ABDOMINAL HYSTERECTOMY    . CERVICAL FUSION  2004   C4-C5 with Instrumentation  . CERVICAL FUSION  1990   C3-C4  . cervical rods    . CHOLECYSTECTOMY    .  kidney stone    . l5-s1 surgery    . LUMBAR MICRODISCECTOMY  2004   Bilateral L5-S1    MEDICATIONS: . acetaminophen (TYLENOL) 500 MG tablet  . atorvastatin (LIPITOR) 20 MG tablet  . baclofen (LIORESAL) 10 MG tablet  . Carboxymethylcellul-Glycerin (LUBRICATING EYE DROPS OP)  . DULoxetine (CYMBALTA) 60 MG capsule  . HYDROcodone-acetaminophen (NORCO/VICODIN) 5-325 MG per tablet  . ibuprofen (ADVIL) 200 MG tablet  . levothyroxine (SYNTHROID) 88 MCG tablet  . meloxicam (MOBIC) 15 MG tablet  . Multiple Vitamin (MULTIVITAMIN WITH MINERALS) TABS tablet  . pantoprazole (PROTONIX) 40 MG tablet  . predniSONE (DELTASONE) 10 MG tablet  .  predniSONE (DELTASONE) 5 MG tablet  . sulfaSALAzine (AZULFIDINE) 500 MG tablet   No current facility-administered medications for this encounter.     Jodell Cipro, PA-C [facility] Pre-Surgical Testing [phone]

## 2020-06-08 ENCOUNTER — Other Ambulatory Visit: Payer: Self-pay | Admitting: Family Medicine

## 2020-06-08 NOTE — Telephone Encounter (Signed)
LFD 03/09/20 #90 with no refills LOV 02/17/20 for pre-op visit/clearance NOV none

## 2020-06-09 ENCOUNTER — Encounter (HOSPITAL_COMMUNITY)
Admission: RE | Disposition: A | Discharge status: Home / Self Care | Payer: Self-pay | Source: Home / Self Care | Attending: Specialist

## 2020-06-09 ENCOUNTER — Other Ambulatory Visit (HOSPITAL_COMMUNITY): Payer: Self-pay | Admitting: Specialist

## 2020-06-09 ENCOUNTER — Inpatient Hospital Stay (HOSPITAL_COMMUNITY): Payer: Medicare Other

## 2020-06-09 ENCOUNTER — Other Ambulatory Visit: Payer: Self-pay

## 2020-06-09 ENCOUNTER — Ambulatory Visit (HOSPITAL_COMMUNITY): Payer: Medicare Other | Admitting: Physician Assistant

## 2020-06-09 ENCOUNTER — Ambulatory Visit (HOSPITAL_COMMUNITY): Payer: Medicare Other | Admitting: Anesthesiology

## 2020-06-09 ENCOUNTER — Inpatient Hospital Stay (HOSPITAL_COMMUNITY)
Admission: RE | Admit: 2020-06-09 | Discharge: 2020-06-14 | DRG: 470 | Disposition: A | Discharge status: Home / Self Care | Payer: Medicare Other | Attending: Specialist | Admitting: Specialist

## 2020-06-09 ENCOUNTER — Encounter (HOSPITAL_COMMUNITY): Payer: Self-pay | Admitting: Specialist

## 2020-06-09 DIAGNOSIS — M3500 Sicca syndrome, unspecified: Secondary | ICD-10-CM | POA: Diagnosis not present

## 2020-06-09 DIAGNOSIS — Z87442 Personal history of urinary calculi: Secondary | ICD-10-CM

## 2020-06-09 DIAGNOSIS — M329 Systemic lupus erythematosus, unspecified: Secondary | ICD-10-CM | POA: Diagnosis present

## 2020-06-09 DIAGNOSIS — Z981 Arthrodesis status: Secondary | ICD-10-CM

## 2020-06-09 DIAGNOSIS — E876 Hypokalemia: Secondary | ICD-10-CM | POA: Diagnosis not present

## 2020-06-09 DIAGNOSIS — K219 Gastro-esophageal reflux disease without esophagitis: Secondary | ICD-10-CM | POA: Diagnosis present

## 2020-06-09 DIAGNOSIS — E785 Hyperlipidemia, unspecified: Secondary | ICD-10-CM | POA: Diagnosis not present

## 2020-06-09 DIAGNOSIS — Z9049 Acquired absence of other specified parts of digestive tract: Secondary | ICD-10-CM

## 2020-06-09 DIAGNOSIS — Z825 Family history of asthma and other chronic lower respiratory diseases: Secondary | ICD-10-CM

## 2020-06-09 DIAGNOSIS — R4182 Altered mental status, unspecified: Secondary | ICD-10-CM | POA: Diagnosis not present

## 2020-06-09 DIAGNOSIS — Z96659 Presence of unspecified artificial knee joint: Secondary | ICD-10-CM

## 2020-06-09 DIAGNOSIS — Z791 Long term (current) use of non-steroidal anti-inflammatories (NSAID): Secondary | ICD-10-CM | POA: Diagnosis not present

## 2020-06-09 DIAGNOSIS — Z8249 Family history of ischemic heart disease and other diseases of the circulatory system: Secondary | ICD-10-CM | POA: Diagnosis not present

## 2020-06-09 DIAGNOSIS — M797 Fibromyalgia: Secondary | ICD-10-CM | POA: Diagnosis present

## 2020-06-09 DIAGNOSIS — E063 Autoimmune thyroiditis: Secondary | ICD-10-CM | POA: Diagnosis not present

## 2020-06-09 DIAGNOSIS — Z90710 Acquired absence of both cervix and uterus: Secondary | ICD-10-CM

## 2020-06-09 DIAGNOSIS — Z79899 Other long term (current) drug therapy: Secondary | ICD-10-CM

## 2020-06-09 DIAGNOSIS — Z79890 Hormone replacement therapy (postmenopausal): Secondary | ICD-10-CM

## 2020-06-09 DIAGNOSIS — Z803 Family history of malignant neoplasm of breast: Secondary | ICD-10-CM | POA: Diagnosis not present

## 2020-06-09 DIAGNOSIS — N390 Urinary tract infection, site not specified: Secondary | ICD-10-CM | POA: Diagnosis not present

## 2020-06-09 DIAGNOSIS — Z7952 Long term (current) use of systemic steroids: Secondary | ICD-10-CM

## 2020-06-09 DIAGNOSIS — Z833 Family history of diabetes mellitus: Secondary | ICD-10-CM | POA: Diagnosis not present

## 2020-06-09 DIAGNOSIS — M069 Rheumatoid arthritis, unspecified: Secondary | ICD-10-CM | POA: Diagnosis present

## 2020-06-09 DIAGNOSIS — Z888 Allergy status to other drugs, medicaments and biological substances status: Secondary | ICD-10-CM | POA: Diagnosis not present

## 2020-06-09 DIAGNOSIS — Z20822 Contact with and (suspected) exposure to covid-19: Secondary | ICD-10-CM | POA: Diagnosis not present

## 2020-06-09 DIAGNOSIS — Z8616 Personal history of COVID-19: Secondary | ICD-10-CM

## 2020-06-09 DIAGNOSIS — M1712 Unilateral primary osteoarthritis, left knee: Principal | ICD-10-CM | POA: Diagnosis present

## 2020-06-09 DIAGNOSIS — M21162 Varus deformity, not elsewhere classified, left knee: Secondary | ICD-10-CM | POA: Diagnosis not present

## 2020-06-09 DIAGNOSIS — Z96651 Presence of right artificial knee joint: Secondary | ICD-10-CM

## 2020-06-09 DIAGNOSIS — D72829 Elevated white blood cell count, unspecified: Secondary | ICD-10-CM

## 2020-06-09 DIAGNOSIS — G8918 Other acute postprocedural pain: Secondary | ICD-10-CM | POA: Diagnosis not present

## 2020-06-09 DIAGNOSIS — R400 Somnolence: Secondary | ICD-10-CM | POA: Diagnosis not present

## 2020-06-09 HISTORY — PX: TOTAL KNEE ARTHROPLASTY: SHX125

## 2020-06-09 SURGERY — ARTHROPLASTY, KNEE, TOTAL
Anesthesia: Regional | Site: Knee | Laterality: Left

## 2020-06-09 MED ORDER — MENTHOL 3 MG MT LOZG: 1.0000 | LOZENGE | OROMUCOSAL | Status: DC | PRN

## 2020-06-09 MED ORDER — LIDOCAINE HCL (PF) 2 % IJ SOLN
INTRAMUSCULAR | Status: AC
  Filled 2020-06-09: qty 5

## 2020-06-09 MED ORDER — FENTANYL CITRATE (PF) 100 MCG/2ML IJ SOLN
INTRAMUSCULAR | Status: AC
  Filled 2020-06-09: qty 2

## 2020-06-09 MED ORDER — ASPIRIN 81 MG PO CHEW
81.0000 mg | CHEWABLE_TABLET | Freq: Two times a day (BID) | ORAL | Status: DC
  Administered 2020-06-10 – 2020-06-14 (×9): 81 mg via ORAL
  Filled 2020-06-09 (×9): qty 1

## 2020-06-09 MED ORDER — FENTANYL CITRATE (PF) 100 MCG/2ML IJ SOLN
50.0000 ug | INTRAMUSCULAR | Status: AC
  Administered 2020-06-09: 50 ug via INTRAVENOUS
  Filled 2020-06-09 (×2): qty 2

## 2020-06-09 MED ORDER — DEXAMETHASONE SODIUM PHOSPHATE 10 MG/ML IJ SOLN: INTRAMUSCULAR | Status: DC | PRN

## 2020-06-09 MED ORDER — OXYCODONE HCL 5 MG PO TABS
5.0000 mg | ORAL_TABLET | ORAL | Status: DC | PRN
  Administered 2020-06-09: 5 mg via ORAL

## 2020-06-09 MED ORDER — HYDROMORPHONE HCL 1 MG/ML IJ SOLN
0.5000 mg | INTRAMUSCULAR | Status: DC | PRN
  Administered 2020-06-09 – 2020-06-10 (×3): 1 mg via INTRAVENOUS
  Administered 2020-06-10: 0.5 mg via INTRAVENOUS
  Filled 2020-06-09 (×3): qty 1

## 2020-06-09 MED ORDER — BUPIVACAINE-EPINEPHRINE 0.25% -1:200000 IJ SOLN
INTRAMUSCULAR | Status: AC
  Filled 2020-06-09: qty 1

## 2020-06-09 MED ORDER — METOCLOPRAMIDE HCL 5 MG PO TABS: 5.0000 mg | ORAL_TABLET | Freq: Three times a day (TID) | ORAL | Status: DC | PRN

## 2020-06-09 MED ORDER — PHENOL 1.4 % MT LIQD: 1.0000 | OROMUCOSAL | Status: DC | PRN

## 2020-06-09 MED ORDER — ONDANSETRON HCL 4 MG/2ML IJ SOLN
INTRAMUSCULAR | Status: DC | PRN
  Administered 2020-06-09: 4 mg via INTRAVENOUS

## 2020-06-09 MED ORDER — RISAQUAD PO CAPS
1.0000 | ORAL_CAPSULE | Freq: Every day | ORAL | Status: DC
  Administered 2020-06-09 – 2020-06-14 (×6): 1 via ORAL
  Filled 2020-06-09 (×6): qty 1

## 2020-06-09 MED ORDER — ONDANSETRON HCL 4 MG/2ML IJ SOLN
4.0000 mg | Freq: Four times a day (QID) | INTRAMUSCULAR | Status: DC | PRN
  Administered 2020-06-09: 4 mg via INTRAVENOUS
  Filled 2020-06-09: qty 2

## 2020-06-09 MED ORDER — POLYETHYLENE GLYCOL 3350 17 G PO PACK: 17.0000 g | PACK | Freq: Every day | ORAL | 0 refills | Status: DC

## 2020-06-09 MED ORDER — BISACODYL 5 MG PO TBEC: 5.0000 mg | DELAYED_RELEASE_TABLET | Freq: Every day | ORAL | Status: DC | PRN

## 2020-06-09 MED ORDER — OXYCODONE HCL 5 MG PO TABS
ORAL_TABLET | ORAL | Status: AC
  Filled 2020-06-09: qty 1

## 2020-06-09 MED ORDER — EPHEDRINE 5 MG/ML INJ
INTRAVENOUS | Status: AC
  Filled 2020-06-09: qty 10

## 2020-06-09 MED ORDER — PROPOFOL 10 MG/ML IV BOLUS
INTRAVENOUS | Status: DC | PRN
  Administered 2020-06-09 (×3): 30 mg via INTRAVENOUS
  Administered 2020-06-09: 50 mg via INTRAVENOUS
  Administered 2020-06-09: 30 mg via INTRAVENOUS

## 2020-06-09 MED ORDER — ACETAMINOPHEN 325 MG PO TABS
325.0000 mg | ORAL_TABLET | Freq: Four times a day (QID) | ORAL | Status: DC | PRN
  Administered 2020-06-12 – 2020-06-14 (×2): 650 mg via ORAL
  Filled 2020-06-09 (×2): qty 2

## 2020-06-09 MED ORDER — OXYCODONE-ACETAMINOPHEN 5-325 MG PO TABS: 1.0000 | ORAL_TABLET | ORAL | 0 refills | Status: DC | PRN

## 2020-06-09 MED ORDER — STERILE WATER FOR IRRIGATION IR SOLN
Status: DC | PRN
  Administered 2020-06-09: 2000 mL

## 2020-06-09 MED ORDER — POLYVINYL ALCOHOL 1.4 % OP SOLN: 1.0000 [drp] | OPHTHALMIC | Status: DC | PRN

## 2020-06-09 MED ORDER — SODIUM CHLORIDE 0.9 % IR SOLN
Status: DC | PRN
  Administered 2020-06-09: 1000 mL

## 2020-06-09 MED ORDER — PROPOFOL 1000 MG/100ML IV EMUL
INTRAVENOUS | Status: AC
  Filled 2020-06-09: qty 100

## 2020-06-09 MED ORDER — ASPIRIN EC 81 MG PO TBEC: 81.0000 mg | DELAYED_RELEASE_TABLET | Freq: Every day | ORAL | 1 refills | Status: DC

## 2020-06-09 MED ORDER — KCL IN DEXTROSE-NACL 20-5-0.45 MEQ/L-%-% IV SOLN
INTRAVENOUS | Status: AC
  Filled 2020-06-09: qty 1000

## 2020-06-09 MED ORDER — PROMETHAZINE HCL 25 MG/ML IJ SOLN: 6.2500 mg | INTRAMUSCULAR | Status: DC | PRN

## 2020-06-09 MED ORDER — OXYCODONE HCL 5 MG PO TABS: 5.0000 mg | ORAL_TABLET | Freq: Once | ORAL | Status: DC | PRN

## 2020-06-09 MED ORDER — ONDANSETRON HCL 4 MG PO TABS: 4.0000 mg | ORAL_TABLET | Freq: Four times a day (QID) | ORAL | Status: DC | PRN

## 2020-06-09 MED ORDER — ACETAMINOPHEN 500 MG PO TABS
1000.0000 mg | ORAL_TABLET | Freq: Four times a day (QID) | ORAL | Status: AC
  Administered 2020-06-09 – 2020-06-10 (×4): 1000 mg via ORAL
  Filled 2020-06-09 (×4): qty 2

## 2020-06-09 MED ORDER — CHLORHEXIDINE GLUCONATE 0.12 % MT SOLN
15.0000 mL | Freq: Once | OROMUCOSAL | Status: AC
  Administered 2020-06-09: 15 mL via OROMUCOSAL

## 2020-06-09 MED ORDER — LACTATED RINGERS IV SOLN: INTRAVENOUS | Status: DC

## 2020-06-09 MED ORDER — BUPIVACAINE IN DEXTROSE 0.75-8.25 % IT SOLN
INTRATHECAL | Status: DC | PRN
  Administered 2020-06-09: 1.6 mL via INTRATHECAL

## 2020-06-09 MED ORDER — PANTOPRAZOLE SODIUM 40 MG PO TBEC
40.0000 mg | DELAYED_RELEASE_TABLET | Freq: Every day | ORAL | Status: DC
  Administered 2020-06-10 – 2020-06-14 (×5): 40 mg via ORAL
  Filled 2020-06-09 (×5): qty 1

## 2020-06-09 MED ORDER — DULOXETINE HCL 60 MG PO CPEP
60.0000 mg | ORAL_CAPSULE | Freq: Every day | ORAL | Status: DC
  Administered 2020-06-09 – 2020-06-13 (×5): 60 mg via ORAL
  Filled 2020-06-09 (×5): qty 1

## 2020-06-09 MED ORDER — PROPOFOL 500 MG/50ML IV EMUL
INTRAVENOUS | Status: DC | PRN
  Administered 2020-06-09: 100 ug/kg/min via INTRAVENOUS

## 2020-06-09 MED ORDER — CEFAZOLIN SODIUM-DEXTROSE 2-4 GM/100ML-% IV SOLN
INTRAVENOUS | Status: AC
  Filled 2020-06-09: qty 100

## 2020-06-09 MED ORDER — BUPIVACAINE HCL (PF) 0.5 % IJ SOLN
INTRAMUSCULAR | Status: DC | PRN
  Administered 2020-06-09: 25 mL

## 2020-06-09 MED ORDER — IRRISEPT - 450ML BOTTLE WITH 0.05% CHG IN STERILE WATER, USP 99.95% OPTIME
TOPICAL | Status: DC | PRN
  Administered 2020-06-09: 450 mL via TOPICAL

## 2020-06-09 MED ORDER — ADULT MULTIVITAMIN W/MINERALS CH
1.0000 | ORAL_TABLET | Freq: Every day | ORAL | Status: DC
  Administered 2020-06-09 – 2020-06-14 (×6): 1 via ORAL
  Filled 2020-06-09 (×6): qty 1

## 2020-06-09 MED ORDER — PHENYLEPHRINE HCL-NACL 10-0.9 MG/250ML-% IV SOLN
INTRAVENOUS | Status: DC | PRN
  Administered 2020-06-09: 30 ug/min via INTRAVENOUS

## 2020-06-09 MED ORDER — PHENYLEPHRINE 40 MCG/ML (10ML) SYRINGE FOR IV PUSH (FOR BLOOD PRESSURE SUPPORT)
PREFILLED_SYRINGE | INTRAVENOUS | Status: AC
  Filled 2020-06-09: qty 10

## 2020-06-09 MED ORDER — OXYCODONE HCL 5 MG/5ML PO SOLN: 5.0000 mg | Freq: Once | ORAL | Status: DC | PRN

## 2020-06-09 MED ORDER — HYDROMORPHONE HCL 1 MG/ML IJ SOLN
INTRAMUSCULAR | Status: AC
  Filled 2020-06-09: qty 1

## 2020-06-09 MED ORDER — ACETAMINOPHEN 10 MG/ML IV SOLN
INTRAVENOUS | Status: AC
  Filled 2020-06-09: qty 100

## 2020-06-09 MED ORDER — OXYCODONE HCL 5 MG PO TABS: 10.0000 mg | ORAL_TABLET | ORAL | Status: DC | PRN

## 2020-06-09 MED ORDER — SODIUM CHLORIDE 0.9 % IR SOLN
Status: DC | PRN
  Administered 2020-06-09: 3000 mL

## 2020-06-09 MED ORDER — PREDNISONE 5 MG PO TABS
5.0000 mg | ORAL_TABLET | Freq: Every day | ORAL | Status: DC
  Administered 2020-06-10 – 2020-06-14 (×5): 5 mg via ORAL
  Filled 2020-06-09 (×5): qty 1

## 2020-06-09 MED ORDER — CEFAZOLIN SODIUM-DEXTROSE 2-4 GM/100ML-% IV SOLN
2.0000 g | INTRAVENOUS | Status: AC
  Administered 2020-06-09: 2 g via INTRAVENOUS
  Filled 2020-06-09: qty 100

## 2020-06-09 MED ORDER — DOCUSATE SODIUM 100 MG PO CAPS
100.0000 mg | ORAL_CAPSULE | Freq: Two times a day (BID) | ORAL | Status: DC
  Administered 2020-06-09 – 2020-06-13 (×8): 100 mg via ORAL
  Filled 2020-06-09 (×9): qty 1

## 2020-06-09 MED ORDER — LEVOTHYROXINE SODIUM 88 MCG PO TABS
88.0000 ug | ORAL_TABLET | Freq: Every day | ORAL | Status: DC
  Administered 2020-06-10 – 2020-06-14 (×5): 88 ug via ORAL
  Filled 2020-06-09 (×5): qty 1

## 2020-06-09 MED ORDER — FENTANYL CITRATE (PF) 250 MCG/5ML IJ SOLN
INTRAMUSCULAR | Status: DC | PRN
  Administered 2020-06-09: 100 ug via INTRAVENOUS

## 2020-06-09 MED ORDER — EPHEDRINE SULFATE-NACL 50-0.9 MG/10ML-% IV SOSY
PREFILLED_SYRINGE | INTRAVENOUS | Status: DC | PRN
  Administered 2020-06-09: 10 mg via INTRAVENOUS
  Administered 2020-06-09 (×2): 5 mg via INTRAVENOUS

## 2020-06-09 MED ORDER — POLYETHYLENE GLYCOL 3350 17 G PO PACK: 17.0000 g | PACK | Freq: Every day | ORAL | Status: DC | PRN

## 2020-06-09 MED ORDER — MAGNESIUM CITRATE PO SOLN: 1.0000 | Freq: Once | ORAL | Status: DC | PRN

## 2020-06-09 MED ORDER — MIDAZOLAM HCL 2 MG/2ML IJ SOLN
1.0000 mg | INTRAMUSCULAR | Status: AC
  Administered 2020-06-09: 2 mg via INTRAVENOUS
  Filled 2020-06-09 (×2): qty 2

## 2020-06-09 MED ORDER — DIPHENHYDRAMINE HCL 12.5 MG/5ML PO ELIX: 12.5000 mg | ORAL_SOLUTION | ORAL | Status: DC | PRN

## 2020-06-09 MED ORDER — TRANEXAMIC ACID-NACL 1000-0.7 MG/100ML-% IV SOLN
1000.0000 mg | INTRAVENOUS | Status: AC
  Administered 2020-06-09: 1000 mg via INTRAVENOUS
  Filled 2020-06-09 (×2): qty 100

## 2020-06-09 MED ORDER — BACLOFEN 10 MG PO TABS
10.0000 mg | ORAL_TABLET | Freq: Three times a day (TID) | ORAL | Status: DC
  Administered 2020-06-09 – 2020-06-12 (×10): 10 mg via ORAL
  Filled 2020-06-09 (×10): qty 1

## 2020-06-09 MED ORDER — LABETALOL HCL 5 MG/ML IV SOLN
INTRAVENOUS | Status: AC
  Filled 2020-06-09: qty 4

## 2020-06-09 MED ORDER — CARBOXYMETHYLCELLUL-GLYCERIN 0.5-0.9 % OP SOLN: 1.0000 [drp] | Freq: Every day | OPHTHALMIC | Status: DC | PRN

## 2020-06-09 MED ORDER — FENTANYL CITRATE (PF) 100 MCG/2ML IJ SOLN
25.0000 ug | INTRAMUSCULAR | Status: DC | PRN
  Administered 2020-06-09 (×3): 50 ug via INTRAVENOUS

## 2020-06-09 MED ORDER — ACETAMINOPHEN 10 MG/ML IV SOLN
1000.0000 mg | INTRAVENOUS | Status: AC
  Administered 2020-06-09: 1000 mg via INTRAVENOUS
  Filled 2020-06-09: qty 100

## 2020-06-09 MED ORDER — LABETALOL HCL 5 MG/ML IV SOLN
INTRAVENOUS | Status: DC | PRN
  Administered 2020-06-09: 5 mg via INTRAVENOUS

## 2020-06-09 MED ORDER — AMISULPRIDE (ANTIEMETIC) 5 MG/2ML IV SOLN: 10.0000 mg | Freq: Once | INTRAVENOUS | Status: DC | PRN

## 2020-06-09 MED ORDER — LIDOCAINE 2% (20 MG/ML) 5 ML SYRINGE
INTRAMUSCULAR | Status: DC | PRN
  Administered 2020-06-09: 30 mg via INTRAVENOUS

## 2020-06-09 MED ORDER — DOCUSATE SODIUM 100 MG PO CAPS: 100.0000 mg | ORAL_CAPSULE | Freq: Two times a day (BID) | ORAL | 1 refills | Status: DC | PRN

## 2020-06-09 MED ORDER — METOCLOPRAMIDE HCL 5 MG/ML IJ SOLN: 5.0000 mg | Freq: Three times a day (TID) | INTRAMUSCULAR | Status: DC | PRN

## 2020-06-09 MED ORDER — ALUM & MAG HYDROXIDE-SIMETH 200-200-20 MG/5ML PO SUSP: 30.0000 mL | ORAL | Status: DC | PRN

## 2020-06-09 MED ORDER — SULFASALAZINE 500 MG PO TABS
1000.0000 mg | ORAL_TABLET | Freq: Two times a day (BID) | ORAL | Status: DC
  Administered 2020-06-09 – 2020-06-14 (×10): 1000 mg via ORAL
  Filled 2020-06-09 (×10): qty 2

## 2020-06-09 MED ORDER — ASPIRIN EC 81 MG PO TBEC: 81.0000 mg | DELAYED_RELEASE_TABLET | Freq: Two times a day (BID) | ORAL | 1 refills | Status: DC

## 2020-06-09 MED ORDER — ACETAMINOPHEN 500 MG PO TABS: 1000.0000 mg | ORAL_TABLET | Freq: Once | ORAL | Status: DC

## 2020-06-09 MED ORDER — PHENYLEPHRINE HCL-NACL 10-0.9 MG/250ML-% IV SOLN
INTRAVENOUS | Status: AC
  Filled 2020-06-09: qty 250

## 2020-06-09 MED ORDER — CEFAZOLIN SODIUM-DEXTROSE 1-4 GM/50ML-% IV SOLN
1.0000 g | Freq: Four times a day (QID) | INTRAVENOUS | Status: AC
  Administered 2020-06-09 – 2020-06-10 (×3): 1 g via INTRAVENOUS
  Filled 2020-06-09 (×3): qty 50

## 2020-06-09 MED FILL — OXYCODONE-APAP 5-325MG: 5-325 | 5 days supply | Qty: 40 | Fill #0

## 2020-06-09 SURGICAL SUPPLY — 82 items
AGENT HMST SPONGE THK3/8 (HEMOSTASIS) ×1
ATTUNE MED DOME PAT 32 KNEE (Knees) ×1 IMPLANT
ATTUNE PS FEM LT SZ 3 CEM KNEE (Femur) ×1 IMPLANT
ATTUNE PSRP INSR SZ3 6 KNEE (Insert) ×1 IMPLANT
BAG DECANTER FOR FLEXI CONT (MISCELLANEOUS) ×2 IMPLANT
BAG SPEC THK2 15X12 ZIP CLS (MISCELLANEOUS)
BAG ZIPLOCK 12X15 (MISCELLANEOUS) IMPLANT
BASE TIBIAL ROT PLAT SZ 3 KNEE (Knees) IMPLANT
BLADE SAG 18X100X1.27 (BLADE) ×2 IMPLANT
BLADE SAW SGTL 11.0X1.19X90.0M (BLADE) ×2 IMPLANT
BLADE SAW SGTL 13.0X1.19X90.0M (BLADE) ×2 IMPLANT
BLADE SURG SZ10 CARB STEEL (BLADE) ×2 IMPLANT
BNDG COHESIVE 4X5 TAN STRL (GAUZE/BANDAGES/DRESSINGS) ×2 IMPLANT
BNDG ELASTIC 4X5.8 VLCR STR LF (GAUZE/BANDAGES/DRESSINGS) ×2 IMPLANT
BNDG ELASTIC 6X5.8 VLCR STR LF (GAUZE/BANDAGES/DRESSINGS) ×2 IMPLANT
BSPLAT TIB 3 CMNT ROT PLAT STR (Knees) ×1 IMPLANT
CEMENT HV SMART SET (Cement) ×4 IMPLANT
COVER SURGICAL LIGHT HANDLE (MISCELLANEOUS) ×2 IMPLANT
COVER WAND RF STERILE (DRAPES) IMPLANT
CUFF TOURN SGL QUICK 34 (TOURNIQUET CUFF) ×2
CUFF TRNQT CYL 34X4.125X (TOURNIQUET CUFF) ×1 IMPLANT
DECANTER SPIKE VIAL GLASS SM (MISCELLANEOUS) ×2 IMPLANT
DRAPE INCISE IOBAN 66X45 STRL (DRAPES) IMPLANT
DRAPE ORTHO SPLIT 77X108 STRL (DRAPES) ×4
DRAPE SHEET LG 3/4 BI-LAMINATE (DRAPES) ×4 IMPLANT
DRAPE SURG ORHT 6 SPLT 77X108 (DRAPES) ×2 IMPLANT
DRAPE U-SHAPE 47X51 STRL (DRAPES) ×2 IMPLANT
DRSG AQUACEL AG ADV 3.5X10 (GAUZE/BANDAGES/DRESSINGS) ×1 IMPLANT
DRSG TEGADERM 4X4.75 (GAUZE/BANDAGES/DRESSINGS) IMPLANT
DURAPREP 26ML APPLICATOR (WOUND CARE) ×2 IMPLANT
ELECT BLADE TIP CTD 4 INCH (ELECTRODE) ×2 IMPLANT
ELECT REM PT RETURN 15FT ADLT (MISCELLANEOUS) ×2 IMPLANT
EVACUATOR 1/8 PVC DRAIN (DRAIN) IMPLANT
GAUZE SPONGE 2X2 8PLY STRL LF (GAUZE/BANDAGES/DRESSINGS) IMPLANT
GLOVE BIOGEL PI IND STRL 7.5 (GLOVE) ×1 IMPLANT
GLOVE BIOGEL PI INDICATOR 7.5 (GLOVE) ×1
GLOVE SRG 8 PF TXTR STRL LF DI (GLOVE) ×1 IMPLANT
GLOVE SURG SS PI 7.5 STRL IVOR (GLOVE) ×4 IMPLANT
GLOVE SURG SS PI 8.0 STRL IVOR (GLOVE) ×4 IMPLANT
GLOVE SURG UNDER POLY LF SZ8 (GLOVE) ×2
GOWN STRL REUS W/TWL XL LVL3 (GOWN DISPOSABLE) ×4 IMPLANT
HANDPIECE INTERPULSE COAX TIP (DISPOSABLE) ×2
HEMOSTAT SPONGE AVITENE ULTRA (HEMOSTASIS) ×2 IMPLANT
HOLDER FOLEY CATH W/STRAP (MISCELLANEOUS) IMPLANT
IMMOBILIZER KNEE 20 (SOFTGOODS) ×2
IMMOBILIZER KNEE 20 THIGH 36 (SOFTGOODS) ×1 IMPLANT
JET LAVAGE IRRISEPT WOUND (IRRIGATION / IRRIGATOR) ×2
KIT TURNOVER KIT A (KITS) IMPLANT
LAVAGE JET IRRISEPT WOUND (IRRIGATION / IRRIGATOR) ×1 IMPLANT
MANIFOLD NEPTUNE II (INSTRUMENTS) ×2 IMPLANT
NDL SAFETY ECLIPSE 18X1.5 (NEEDLE) IMPLANT
NEEDLE HYPO 18GX1.5 SHARP (NEEDLE)
NS IRRIG 1000ML POUR BTL (IV SOLUTION) IMPLANT
PACK TOTAL KNEE CUSTOM (KITS) ×2 IMPLANT
PIN DRILL FIX HALF THREAD (BIT) ×1 IMPLANT
PIN STEINMAN FIXATION KNEE (PIN) ×1 IMPLANT
PROTECTOR NERVE ULNAR (MISCELLANEOUS) ×2 IMPLANT
SEALER BIPOLAR AQUA 6.0 (INSTRUMENTS) ×1 IMPLANT
SET HNDPC FAN SPRY TIP SCT (DISPOSABLE) ×1 IMPLANT
SPONGE GAUZE 2X2 STER 10/PKG (GAUZE/BANDAGES/DRESSINGS)
SPONGE LAP 18X18 RF (DISPOSABLE) ×2 IMPLANT
SPONGE SURGIFOAM ABS GEL 100 (HEMOSTASIS) IMPLANT
STAPLER VISISTAT (STAPLE) ×1 IMPLANT
STRIP CLOSURE SKIN 1/2X4 (GAUZE/BANDAGES/DRESSINGS) IMPLANT
SUT BONE WAX W31G (SUTURE) IMPLANT
SUT MNCRL AB 4-0 PS2 18 (SUTURE) IMPLANT
SUT STRATAFIX 0 PDS 27 VIOLET (SUTURE) ×2
SUT VIC AB 1 CT1 27 (SUTURE) ×4
SUT VIC AB 1 CT1 27XBRD ANTBC (SUTURE) ×2 IMPLANT
SUT VIC AB 1 CTX 36 (SUTURE)
SUT VIC AB 1 CTX36XBRD ANBCTR (SUTURE) IMPLANT
SUT VIC AB 2-0 CT1 27 (SUTURE) ×6
SUT VIC AB 2-0 CT1 TAPERPNT 27 (SUTURE) ×3 IMPLANT
SUTURE STRATFX 0 PDS 27 VIOLET (SUTURE) ×1 IMPLANT
SYR 3ML LL SCALE MARK (SYRINGE) IMPLANT
SYR 50ML LL SCALE MARK (SYRINGE) IMPLANT
TIBIAL BASE ROT PLAT SZ 3 KNEE (Knees) ×2 IMPLANT
TOWER CARTRIDGE SMART MIX (DISPOSABLE) ×2 IMPLANT
TRAY FOLEY MTR SLVR 16FR STAT (SET/KITS/TRAYS/PACK) ×2 IMPLANT
WATER STERILE IRR 1000ML POUR (IV SOLUTION) ×2 IMPLANT
WIPE CHG CHLORHEXIDINE 2% (PERSONAL CARE ITEMS) ×2 IMPLANT
WRAP KNEE MAXI GEL POST OP (GAUZE/BANDAGES/DRESSINGS) ×2 IMPLANT

## 2020-06-09 NOTE — Transfer of Care (Signed)
Immediate Anesthesia Transfer of Care Note  Patient: Becky Boyd  Procedure(s) Performed: TOTAL KNEE ARTHROPLASTY (Left Knee)  Patient Location: PACU  Anesthesia Type:Spinal  Level of Consciousness: drowsy  Airway & Oxygen Therapy: Patient Spontanous Breathing and Patient connected to face mask  Post-op Assessment: Report given to RN and Post -op Vital signs reviewed and stable  Post vital signs: Reviewed and stable  Last Vitals:  Vitals Value Taken Time  BP 137/79 06/09/20 1615  Temp    Pulse 71 06/09/20 1616  Resp 16 06/09/20 1616  SpO2 99 % 06/09/20 1616  Vitals shown include unvalidated device data.  Last Pain:  Vitals:   06/09/20 1104  TempSrc:   PainSc: 5       Patients Stated Pain Goal: 4 (06/09/20 1104)  Complications: No complications documented.

## 2020-06-09 NOTE — Anesthesia Postprocedure Evaluation (Signed)
Anesthesia Post Note  Patient: Becky Boyd  Procedure(s) Performed: TOTAL KNEE ARTHROPLASTY (Left Knee)     Patient location during evaluation: PACU Anesthesia Type: Regional and Spinal Level of consciousness: oriented and awake and alert Pain management: pain level controlled Vital Signs Assessment: post-procedure vital signs reviewed and stable Respiratory status: spontaneous breathing and respiratory function stable Cardiovascular status: blood pressure returned to baseline and stable Postop Assessment: no headache, no backache, no apparent nausea or vomiting and patient able to bend at knees Anesthetic complications: no   No complications documented.  Last Vitals:  Vitals:   06/09/20 1645 06/09/20 1700  BP: 120/76 126/75  Pulse: 69 66  Resp: 17 13  Temp:    SpO2: 97% 97%    Last Pain:  Vitals:   06/09/20 1708  TempSrc:   PainSc: 7                  Candra R Misbah Hornaday

## 2020-06-09 NOTE — Anesthesia Procedure Notes (Signed)
Spinal  Patient location during procedure: OR Start time: 06/09/2020 1:10 PM End time: 06/09/2020 1:15 PM Staffing Performed: anesthesiologist  Anesthesiologist: Mellody Dance, MD Preanesthetic Checklist Completed: patient identified, IV checked, risks and benefits discussed, surgical consent, monitors and equipment checked, pre-op evaluation and timeout performed Spinal Block Patient position: sitting Prep: DuraPrep Patient monitoring: cardiac monitor, continuous pulse ox and blood pressure Approach: midline Location: L3-4 Injection technique: single-shot Needle Needle type: Pencan  Needle gauge: 24 G Needle length: 9 cm Additional Notes Functioning IV was confirmed and monitors were applied. Sterile prep and drape, including hand hygiene and sterile gloves were used. The patient was positioned and the spine was prepped. The skin was anesthetized with lidocaine.  Free flow of clear CSF was obtained prior to injecting local anesthetic into the CSF.  The spinal needle aspirated freely following injection.  The needle was carefully withdrawn.  The patient tolerated the procedure well.

## 2020-06-09 NOTE — Anesthesia Procedure Notes (Signed)
Procedure Name: LMA Insertion Date/Time: 06/09/2020 2:34 PM Performed by: Vanessa [place], CRNA Pre-anesthesia Checklist: Emergency Drugs available, Patient identified, Suction available and Patient being monitored Patient Re-evaluated:Patient Re-evaluated prior to induction Oxygen Delivery Method: Circle system utilized Preoxygenation: Pre-oxygenation with 100% oxygen Induction Type: IV induction Ventilation: Mask ventilation without difficulty LMA: LMA inserted LMA Size: 4.0 Number of attempts: 1 Placement Confirmation: positive ETCO2 and breath sounds checked- equal and bilateral Tube secured with: Tape Dental Injury: Teeth and Oropharynx as per pre-operative assessment

## 2020-06-09 NOTE — Care Plan (Signed)
Ortho Bundle Case Management Note  Patient Details  Name: Kuulei Kleier MRN: [id number] Date of Birth: 1955-06-06  L TKA on 06-09-20 DCP:  Home with husband.  2 story home with 3 ste. DME:  No needs.  Has a RW and 3-in-1. PT:  [facility].  PT eval scheduled on 06-13-20.                   DME Arranged:  N/A DME Agency:  NA  HH Arranged:  NA HH Agency:  NA  Additional Comments: Please contact me with any questions of if this plan should need to change.  Ennis Forts, RN,CCM [facility]  [phone] 06/09/2020, 10:34 AM

## 2020-06-09 NOTE — Anesthesia Procedure Notes (Addendum)
Anesthesia Regional Block: Adductor canal block   Pre-Anesthetic Checklist: ,, timeout performed, Correct Patient, Correct Site, Correct Laterality, Correct Procedure, Correct Position, site marked, Risks and benefits discussed,  Surgical consent,  Pre-op evaluation,  At surgeon's request and post-op pain management  Laterality: Left  Prep: chloraprep       Needles:  Injection technique: Single-shot  Needle Type: Echogenic Stimulator Needle     Needle Length: 10cm  Needle Gauge: 20     Additional Needles:   Procedures:,,,, ultrasound used (permanent image in chart),,,,  Narrative:  Start time: 06/09/2020 12:10 PM End time: 06/09/2020 12:15 PM Injection made incrementally with aspirations every 5 mL.  Performed by: Personally  Anesthesiologist: Mellody Dance, MD  Additional Notes: A functioning IV was confirmed and monitors were applied.  Sterile prep and drape, hand hygiene and sterile gloves were used.  Negative aspiration and test dose prior to incremental administration of local anesthetic. The patient tolerated the procedure well.Ultrasound  guidance: relevant anatomy identified, needle position confirmed, local anesthetic spread visualized around nerve(s), vascular puncture avoided.  Image printed for medical record.

## 2020-06-09 NOTE — Anesthesia Preprocedure Evaluation (Addendum)
Anesthesia Evaluation  Patient identified by MRN, date of birth, ID band Patient awake    Reviewed: Allergy & Precautions, NPO status , Patient's Chart, lab work & pertinent test results  Airway Mallampati: II  TM Distance: >3 FB Neck ROM: Limited    Dental  (+) Poor Dentition, Missing Multiple missing teeth:   Pulmonary  H/o Covid   Pulmonary exam normal        Cardiovascular Exercise Tolerance: Good METS: 3 - Mets Normal cardiovascular exam Rhythm:Regular Rate:Normal     Neuro/Psych negative psych ROS   GI/Hepatic GERD  ,  Endo/Other  Hypothyroidism   Renal/GU Renal disease (kidney stones)  negative genitourinary   Musculoskeletal  (+) Arthritis , Osteoarthritis,  Fibromyalgia -  Abdominal   Peds negative pediatric ROS (+)  Hematology  (+) anemia ,   Anesthesia Other Findings Sjogren's   Reproductive/Obstetrics                            Anesthesia Physical Anesthesia Plan  ASA: III  Anesthesia Plan: Regional and Spinal   Post-op Pain Management:  Regional for Post-op pain   Induction:   PONV Risk Score and Plan: 2 and TIVA, Treatment may vary due to age or medical condition, Midazolam, Ondansetron and Propofol infusion  Airway Management Planned: Natural Airway and Simple Face Mask  Additional Equipment:   Intra-op Plan:   Post-operative Plan:   Informed Consent: I have reviewed the patients History and Physical, chart, labs and discussed the procedure including the risks, benefits and alternatives for the proposed anesthesia with the patient or authorized representative who has indicated his/her understanding and acceptance.       Plan Discussed with: CRNA and Anesthesiologist  Anesthesia Plan Comments:         Anesthesia Quick Evaluation

## 2020-06-09 NOTE — Progress Notes (Signed)
AssistedDr. Bass with left, ultrasound guided, adductor canal block. Side rails up, monitors on throughout procedure. See vital signs in flow sheet. Tolerated Procedure well.  

## 2020-06-09 NOTE — Brief Op Note (Signed)
06/09/2020  3:57 PM  PATIENT:  [name]  65 y.o. female  PRE-OPERATIVE DIAGNOSIS:  Left knee degenerative joint disease  POST-OPERATIVE DIAGNOSIS:  Left knee degenerative joint disease  PROCEDURE:  Procedure(s): TOTAL KNEE ARTHROPLASTY (Left)  SURGEON:  Surgeon(s) and Role:    Jene Every, MD - Primary  PHYSICIAN ASSISTANT:   ASSISTANTS: Bissell   ANESTHESIA:   Spinal  EBL:  300 mL   BLOOD ADMINISTERED:none  DRAINS: none   LOCAL MEDICATIONS USED:  MARCAINE     SPECIMEN:  No Specimen  DISPOSITION OF SPECIMEN:  N/A  COUNTS:  YES  TOURNIQUET:   Total Tourniquet Time Documented: Thigh (Left) - 84 minutes Total: Thigh (Left) - 84 minutes   DICTATION: .Other Dictation: Dictation Number     [id number]  PLAN OF CARE: Admit to inpatient   PATIENT DISPOSITION:  PACU - hemodynamically stable.   Delay start of Pharmacological VTE agent (>24hrs) due to surgical blood loss or risk of bleeding: no

## 2020-06-09 NOTE — Interval H&P Note (Signed)
History and Physical Interval Note:  06/09/2020 12:37 PM  Becky Boyd  has presented today for surgery, with the diagnosis of Left knee degenerative joint disease.  The various methods of treatment have been discussed with the patient and family. After consideration of risks, benefits and other options for treatment, the patient has consented to  Procedure(s): TOTAL KNEE ARTHROPLASTY (Left) as a surgical intervention.  The patient's history has been reviewed, patient examined, no change in status, stable for surgery.  I have reviewed the patient's chart and labs.  Questions were answered to the patient's satisfaction.     Javier Docker

## 2020-06-09 NOTE — Discharge Instructions (Signed)
Elevate leg above heart 6x a day for 20minutes each Use knee immobilizer while walking until can SLR x 10 Use knee immobilizer in bed to keep knee in extension Aquacel dressing may remain in place until follow up. May shower with aquacel dressing in place. If the dressing becomes saturated or peels off, you may remove aquacel dressing. Do not remove steri-strips if they are present. Place new dressing with gauze and tape or ACE bandage which should be kept clean and dry and changed daily.  INSTRUCTIONS AFTER JOINT REPLACEMENT   o Remove items at home which could result in a fall. This includes throw rugs or furniture in walking pathways o ICE to the affected joint every three hours while awake for 30 minutes at a time, for at least the first 3-5 days, and then as needed for pain and swelling.  Continue to use ice for pain and swelling. You may notice swelling that will progress down to the foot and ankle.  This is normal after surgery.  Elevate your leg when you are not up walking on it.   o Continue to use the breathing machine you got in the hospital (incentive spirometer) which will help keep your temperature down.  It is common for your temperature to cycle up and down following surgery, especially at night when you are not up moving around and exerting yourself.  The breathing machine keeps your lungs expanded and your temperature down.   DIET:  As you were doing prior to hospitalization, we recommend a well-balanced diet.  DRESSING / WOUND CARE / SHOWERING  Keep the surgical dressing until follow up.  The dressing is water proof, so you can shower without any extra covering.  IF THE DRESSING FALLS OFF or the wound gets wet inside, change the dressing with sterile gauze.  Please use good hand washing techniques before changing the dressing.  Do not use any lotions or creams on the incision until instructed by your surgeon.    ACTIVITY  o Increase activity slowly as tolerated, but follow the  weight bearing instructions below.   o No driving for 6 weeks or until further direction given by your physician.  You cannot drive while taking narcotics.  o No lifting or carrying greater than 10 lbs. until further directed by your surgeon. o Avoid periods of inactivity such as sitting longer than an hour when not asleep. This helps prevent blood clots.  o You may return to work once you are authorized by your doctor.     WEIGHT BEARING   Weight bearing as tolerated with assist device (walker, cane, etc) as directed, use it as long as suggested by your surgeon or therapist, typically at least 4-6 weeks.   EXERCISES  Results after joint replacement surgery are often greatly improved when you follow the exercise, range of motion and muscle strengthening exercises prescribed by your doctor. Safety measures are also important to protect the joint from further injury. Any time any of these exercises cause you to have increased pain or swelling, decrease what you are doing until you are comfortable again and then slowly increase them. If you have problems or questions, call your caregiver or physical therapist for advice.   Rehabilitation is important following a joint replacement. After just a few days of immobilization, the muscles of the leg can become weakened and shrink (atrophy).  These exercises are designed to build up the tone and strength of the thigh and leg muscles and to improve motion. Often   times heat used for twenty to thirty minutes before working out will loosen up your tissues and help with improving the range of motion but do not use heat for the first two weeks following surgery (sometimes heat can increase post-operative swelling).   These exercises can be done on a training (exercise) mat, on the floor, on a table or on a bed. Use whatever works the best and is most comfortable for you.    Use music or television while you are exercising so that the exercises are a pleasant  break in your day. This will make your life better with the exercises acting as a break in your routine that you can look forward to.   Perform all exercises about fifteen times, three times per day or as directed.  You should exercise both the operative leg and the other leg as well.  Exercises include:   . Quad Sets - Tighten up the muscle on the front of the thigh (Quad) and hold for 5-10 seconds.   . Straight Leg Raises - With your knee straight (if you were given a brace, keep it on), lift the leg to 60 degrees, hold for 3 seconds, and slowly lower the leg.  Perform this exercise against resistance later as your leg gets stronger.  . Leg Slides: Lying on your back, slowly slide your foot toward your buttocks, bending your knee up off the floor (only go as far as is comfortable). Then slowly slide your foot back down until your leg is flat on the floor again.  . Angel Wings: Lying on your back spread your legs to the side as far apart as you can without causing discomfort.  . Hamstring Strength:  Lying on your back, push your heel against the floor with your leg straight by tightening up the muscles of your buttocks.  Repeat, but this time bend your knee to a comfortable angle, and push your heel against the floor.  You may put a pillow under the heel to make it more comfortable if necessary.   A rehabilitation program following joint replacement surgery can speed recovery and prevent re-injury in the future due to weakened muscles. Contact your doctor or a physical therapist for more information on knee rehabilitation.    CONSTIPATION  Constipation is defined medically as fewer than three stools per week and severe constipation as less than one stool per week.  Even if you have a regular bowel pattern at home, your normal regimen is likely to be disrupted due to multiple reasons following surgery.  Combination of anesthesia, postoperative narcotics, change in appetite and fluid intake all can  affect your bowels.   YOU MUST use at least one of the following options; they are listed in order of increasing strength to get the job done.  They are all available over the counter, and you may need to use some, POSSIBLY even all of these options:    Drink plenty of fluids (prune juice may be helpful) and high fiber foods Colace 100 mg by mouth twice a day  Senokot for constipation as directed and as needed Dulcolax (bisacodyl), take with full glass of water  Miralax (polyethylene glycol) once or twice a day as needed.  If you have tried all these things and are unable to have a bowel movement in the first 3-4 days after surgery call either your surgeon or your primary doctor.    If you experience loose stools or diarrhea, hold the medications until you stool   forms back up.  If your symptoms do not get better within 1 week or if they get worse, check with your doctor.  If you experience "the worst abdominal pain ever" or develop nausea or vomiting, please contact the office immediately for further recommendations for treatment.   ITCHING:  If you experience itching with your medications, try taking only a single pain pill, or even half a pain pill at a time.  You can also use Benadryl over the counter for itching or also to help with sleep.   TED HOSE STOCKINGS:  Use stockings on both legs until for at least 2 weeks or as directed by physician office. They may be removed at night for sleeping.  MEDICATIONS:  See your medication summary on the "After Visit Summary" that nursing will review with you.  You may have some home medications which will be placed on hold until you complete the course of blood thinner medication.  It is important for you to complete the blood thinner medication as prescribed.  PRECAUTIONS:  If you experience chest pain or shortness of breath - call 911 immediately for transfer to the hospital emergency department.   If you develop a fever greater that 101 F, purulent  drainage from wound, increased redness or drainage from wound, foul odor from the wound/dressing, or calf pain - CONTACT YOUR SURGEON.                                                   FOLLOW-UP APPOINTMENTS:  If you do not already have a post-op appointment, please call the office for an appointment to be seen by your surgeon.  Guidelines for how soon to be seen are listed in your "After Visit Summary", but are typically between 1-4 weeks after surgery.  OTHER INSTRUCTIONS:   Knee Replacement:  Do not place pillow under knee, focus on keeping the knee straight while resting. CPM instructions: 0-90 degrees, 2 hours in the morning, 2 hours in the afternoon, and 2 hours in the evening. Place foam block, curve side up under heel at all times except when in CPM or when walking.  DO NOT modify, tear, cut, or change the foam block in any way.   DENTAL ANTIBIOTICS:  In most cases prophylactic antibiotics for Dental procdeures after total joint surgery are not necessary.  Exceptions are as follows:  1. History of prior total joint infection  2. Severely immunocompromised (Organ Transplant, cancer chemotherapy, Rheumatoid biologic meds such as Humera)  3. Poorly controlled diabetes (A1C &gt; 8.0, blood glucose over 200)  If you have one of these conditions, contact your surgeon for an antibiotic prescription, prior to your dental procedure.   MAKE SURE YOU:  . Understand these instructions.  . Get help right away if you are not doing well or get worse.    Thank you for letting us be a part of your medical care team.  It is a privilege we respect greatly.  We hope these instructions will help you stay on track for a fast and full recovery!   

## 2020-06-10 LAB — CBC
HCT: 33.6 % — ABNORMAL LOW (ref 36.0–46.0)
Hemoglobin: 10.5 g/dL — ABNORMAL LOW (ref 12.0–15.0)
MCH: 33.3 pg (ref 26.0–34.0)
MCHC: 31.3 g/dL (ref 30.0–36.0)
MCV: 106.7 fL — ABNORMAL HIGH (ref 80.0–100.0)
Platelets: 227 10*3/uL (ref 150–400)
RBC: 3.15 MIL/uL — ABNORMAL LOW (ref 3.87–5.11)
RDW: 13.5 % (ref 11.5–15.5)
WBC: 14 10*3/uL — ABNORMAL HIGH (ref 4.0–10.5)
nRBC: 0 % (ref 0.0–0.2)

## 2020-06-10 LAB — BASIC METABOLIC PANEL
Anion gap: 10 (ref 5–15)
BUN: 10 mg/dL (ref 8–23)
CO2: 25 mmol/L (ref 22–32)
Calcium: 8.8 mg/dL — ABNORMAL LOW (ref 8.9–10.3)
Chloride: 100 mmol/L (ref 98–111)
Creatinine, Ser: 0.75 mg/dL (ref 0.44–1.00)
GFR, Estimated: >60 mL/min (ref >60)
Glucose, Bld: 153 mg/dL — ABNORMAL HIGH (ref 70–99)
Potassium: 3.7 mmol/L (ref 3.5–5.1)
Sodium: 135 mmol/L (ref 135–145)

## 2020-06-10 LAB — GLUCOSE, CAPILLARY: Glucose-Capillary: 82 mg/dL (ref 70–99)

## 2020-06-10 MED ORDER — OXYCODONE HCL 5 MG PO TABS
5.0000 mg | ORAL_TABLET | ORAL | Status: DC | PRN
  Administered 2020-06-10 – 2020-06-13 (×4): 5 mg via ORAL
  Filled 2020-06-10 (×4): qty 1

## 2020-06-10 MED ORDER — TRAMADOL HCL 50 MG PO TABS
50.0000 mg | ORAL_TABLET | Freq: Four times a day (QID) | ORAL | Status: DC | PRN
  Administered 2020-06-10: 13:00:00 100 mg via ORAL
  Administered 2020-06-12: 50 mg via ORAL
  Filled 2020-06-10 (×3): qty 1

## 2020-06-10 MED ORDER — OXYCODONE HCL 5 MG PO TABS
10.0000 mg | ORAL_TABLET | ORAL | Status: DC | PRN
  Administered 2020-06-10 – 2020-06-12 (×3): 10 mg via ORAL
  Filled 2020-06-10 (×3): qty 2

## 2020-06-10 NOTE — Progress Notes (Signed)
Becky Boyd  MRN: [id number] DOB/Age: Mar 18, 1956 65 y.o. [facility] Procedure: Procedure(s) (LRB): TOTAL KNEE ARTHROPLASTY (Left)     Subjective: Becky Boyd is completely oversedated and almost unarousable this morning upon entering the room.  On several attempts she is unable to even hold her eyes open.  Upon review with the day nurse she received only IV medications through the night for pain meds.  She received a total of 3-1/2 of Dilaudid IV and the most recent dose was around 6 AM.  No oral meds have been administered as of yet.   Vital Signs Temp:  [97.7 F (36.5 C)-98.1 F (36.7 C)] 98 F (36.7 C) (01/08 0643) Pulse Rate:  [66-92] 92 (01/08 0643) Resp:  [6-25] 17 (01/08 0301) BP: (92-172)/(57-109) 134/71 (01/08 0643) SpO2:  [94 %-100 %] 98 % (01/08 0643) Weight:  [69.4 kg] 69.4 kg (01/07 1105)  Lab Results Recent Labs    06/10/20 0553  WBC 14.0*  HGB 10.5*  HCT 33.6*  PLT 227   BMET Recent Labs    06/10/20 0553  NA 135  K 3.7  CL 100  CO2 25  GLUCOSE 153*  BUN 10  CREATININE 0.75  CALCIUM 8.8*   INR  Date Value Ref Range Status  06/06/2020 1.0 0.8 - 1.2 Final    Comment:    (NOTE) INR goal varies based on device and disease states. Performed at [facility], [address], [place], [place] [address]      Exam Her dressing is dry knee immobilizer is in place.  She does wiggle toe faintly on command but then quickly dozes back off.        Plan Hold all IV narcotics to allow patient to become more coherent.  Obviously she is going to need an additional day because it is not safe to work with her from a therapy standpoint yet.  She was not in respiratory distress so I do not feel Narcan is necessary.  I am going to review all of her pain medication orders and adjust.  We have discussed treatment plan with the nurse taking care of her today.  Hopefully she will be much more alert to try to work with therapy later  this afternoon.  I adjusted her dosing to provide tramadol for mild to moderate pain, and I have decreased the oxycodone dosing for the moderate to severe pain.  Please see new orders and try to limit all narcotics.  Tylenol was also ordered.  Korie Brabson PA-C  06/10/2020, 9:22 AM Contact # [phone]

## 2020-06-10 NOTE — Evaluation (Signed)
Physical Therapy Evaluation Patient Details Name: Becky Boyd MRN: [id number] DOB: June 22, 1955 Today's Date: 06/10/2020   History of Present Illness  Pt s/p L TKR and with hx of RA, Sjogrens, and multiple spinal surgeries including cervical fusion  Clinical Impression  Pt s/p L TKR and presents with decreased L LE strength/ROM, post op pain, and premorbid deconditioning limiting functional mobility.  Pt should progress to dc home with family assist.    Follow Up Recommendations Home health PT    Equipment Recommendations  None recommended by PT    Recommendations for Other Services       Precautions / Restrictions Precautions Precautions: Fall;Knee Required Braces or Orthoses: Knee Immobilizer - Left Knee Immobilizer - Left: Discontinue once straight leg raise with < 10 degree lag Restrictions Weight Bearing Restrictions: No Other Position/Activity Restrictions: WBAT      Mobility  Bed Mobility Overal bed mobility: Needs Assistance Bed Mobility: Supine to Sit     Supine to sit: Mod assist;+2 for physical assistance;+2 for safety/equipment     General bed mobility comments: Increased time with cues for sequence and use of R LE to self assist.    Transfers Overall transfer level: Needs assistance Equipment used: Rolling walker (2 wheeled) Transfers: Sit to/from [employer] Sit to Stand: Mod assist;Max assist;+2 physical assistance;+2 safety/equipment;From elevated surface Stand pivot transfers: Mod assist;Max assist;+2 physical assistance;+2 safety/equipment;From elevated surface       General transfer comment: cues for LE management and use of UEs to self assist.  Physical assist to bring wt up and fwd and to balance in initial standing  Ambulation/Gait             General Gait Details: stand pvt bed to [facility] to recliner only - pain limited  Stairs            Wheelchair Mobility    Modified Rankin (Stroke Patients Only)        Balance Overall balance assessment: Needs assistance Sitting-balance support: No upper extremity supported;Feet supported Sitting balance-Leahy Scale: Fair     Standing balance support: Bilateral upper extremity supported Standing balance-Leahy Scale: Poor                               Pertinent Vitals/Pain Pain Assessment: 0-10 Pain Score: 10-Worst pain ever Pain Location: L knee/thigh Pain Descriptors / Indicators: Aching;Grimacing;Sore;Tightness Pain Intervention(s): Limited activity within patient's tolerance;Monitored during session;Premedicated before session;Ice applied    Home Living Family/patient expects to be discharged to:: Private residence Living Arrangements: Spouse/significant other Available Help at Discharge: Family Type of Home: House Home Access: Stairs to enter Entrance Stairs-Rails: Doctor, general practice of Steps: 3 Home Layout: Multi-level;Bed/bath upstairs Home Equipment: Environmental consultant - 2 wheels;Cane - single point;Bedside commode      Prior Function Level of Independence: Independent;Independent with assistive device(s)               Hand Dominance        Extremity/Trunk Assessment   Upper Extremity Assessment Upper Extremity Assessment: Overall WFL for tasks assessed    Lower Extremity Assessment Lower Extremity Assessment: LLE deficits/detail LLE: Unable to fully assess due to pain    Cervical / Trunk Assessment Cervical / Trunk Assessment: Kyphotic  Communication   Communication: No difficulties  Cognition Arousal/Alertness: Awake/alert Behavior During Therapy: WFL for tasks assessed/performed Overall Cognitive Status: Within Functional Limits for tasks assessed  General Comments      Exercises Total Joint Exercises Ankle Circles/Pumps: AROM;Both;15 reps;Supine   Assessment/Plan    PT Assessment Patient needs continued PT services  PT Problem  List Decreased strength;Decreased range of motion;Decreased activity tolerance;Decreased balance;Decreased mobility;Decreased knowledge of use of DME;Pain       PT Treatment Interventions DME instruction;Gait training;Stair training;Functional mobility training;Therapeutic activities;Therapeutic exercise;Patient/family education    PT Goals (Current goals can be found in the Care Plan section)  Acute Rehab PT Goals Patient Stated Goal: less pain PT Goal Formulation: With patient Time For Goal Achievement: 06/17/20 Potential to Achieve Goals: Fair    Frequency 7X/week   Barriers to discharge        Co-evaluation               AM-PAC PT "6 Clicks" Mobility  Outcome Measure Help needed turning from your back to your side while in a flat bed without using bedrails?: A Lot Help needed moving from lying on your back to sitting on the side of a flat bed without using bedrails?: A Lot Help needed moving to and from a bed to a chair (including a wheelchair)?: A Lot Help needed standing up from a chair using your arms (e.g., wheelchair or bedside chair)?: A Lot Help needed to walk in hospital room?: Total Help needed climbing 3-5 steps with a railing? : Total 6 Click Score: 10    End of Session Equipment Utilized During Treatment: Gait belt;Left knee immobilizer Activity Tolerance: Patient limited by pain Patient left: in chair;with call bell/phone within reach;with chair alarm set;with nursing/sitter in room Nurse Communication: Mobility status PT Visit Diagnosis: Difficulty in walking, not elsewhere classified (R26.2);Muscle weakness (generalized) (M62.81);Pain Pain - Right/Left: Left Pain - part of body: Knee    Time: 5697-9480 PT Time Calculation (min) (ACUTE ONLY): 40 min   Charges:   PT Evaluation $PT Eval Low Complexity: 1 Low PT Treatments $Therapeutic Activity: 8-22 mins        Mauro Kaufmann PT Acute Rehabilitation Services Pager [phone] Office  [phone]   Rondia Higginbotham 06/10/2020, 4:49 PM

## 2020-06-10 NOTE — Progress Notes (Signed)
Physical Therapy Treatment Patient Details Name: Becky Boyd MRN: [id number] DOB: [birth year] Today's Date: 06/10/2020    History of Present Illness Pt s/p L TKR and with hx of RA, Sjogrens, and multiple spinal surgeries including cervical fusion    PT Comments    Pt continues cooperative but pain limited and fatigues easily.   Follow Up Recommendations  Home health PT     Equipment Recommendations  None recommended by PT    Recommendations for Other Services       Precautions / Restrictions Precautions Precautions: Fall;Knee Required Braces or Orthoses: Knee Immobilizer - Left Knee Immobilizer - Left: Discontinue once straight leg raise with < 10 degree lag Restrictions Weight Bearing Restrictions: No Other Position/Activity Restrictions: WBAT    Mobility  Bed Mobility Overal bed mobility: Needs Assistance Bed Mobility: Sit to Supine     Supine to sit: Mod assist;+2 for physical assistance;+2 for safety/equipment Sit to supine: Mod assist;+2 for physical assistance   General bed mobility comments: Increased time with cues for sequence and use of R LE to self assist.  Physical assist for trunk control and to manage LEs  Transfers Overall transfer level: Needs assistance Equipment used: Rolling walker (2 wheeled) Transfers: Sit to/from [employer] Sit to Stand: Mod assist;Max assist;+2 physical assistance;+2 safety/equipment;From elevated surface Stand pivot transfers: Mod assist;Max assist;+2 physical assistance;+2 safety/equipment;From elevated surface       General transfer comment: cues for LE management and use of UEs to self assist.  Physical assist to bring wt up and fwd and to balance in initial standing  Ambulation/Gait Ambulation/Gait assistance: Mod assist;+2 physical assistance;+2 safety/equipment Gait Distance (Feet): 2 Feet Assistive device: Rolling walker (2 wheeled) Gait Pattern/deviations: Step-to pattern;Decreased step length -  right;Shuffle;Trunk flexed;Decreased step length - left;Decreased stance time - left Gait velocity: decr   General Gait Details: Increased time with cues for sequence, posture, safety and position from RW - limited by pain and fatigue   Stairs             Wheelchair Mobility    Modified Rankin (Stroke Patients Only)       Balance Overall balance assessment: Needs assistance Sitting-balance support: No upper extremity supported;Feet supported Sitting balance-Leahy Scale: Fair     Standing balance support: Bilateral upper extremity supported Standing balance-Leahy Scale: Poor                              Cognition Arousal/Alertness: Awake/alert Behavior During Therapy: WFL for tasks assessed/performed Overall Cognitive Status: Within Functional Limits for tasks assessed                                        Exercises Total Joint Exercises Ankle Circles/Pumps: AROM;Both;15 reps;Supine    General Comments        Pertinent Vitals/Pain Pain Assessment: 0-10 Pain Score: 10-Worst pain ever Pain Location: L knee/thigh Pain Descriptors / Indicators: Aching;Grimacing;Sore;Tightness Pain Intervention(s): Limited activity within patient's tolerance;Monitored during session;Premedicated before session;Ice applied    Home Living Family/patient expects to be discharged to:: Private residence Living Arrangements: Spouse/significant other Available Help at Discharge: Family Type of Home: House Home Access: Stairs to enter Entrance Stairs-Rails: Right;Left Home Layout: Multi-level;Bed/bath upstairs Home Equipment: Environmental consultant - 2 wheels;Cane - single point;Bedside commode      Prior Function Level of Independence: Independent;Independent with assistive device(s)  PT Goals (current goals can now be found in the care plan section) Acute Rehab PT Goals Patient Stated Goal: less pain PT Goal Formulation: With patient Time For Goal  Achievement: 06/17/20 Potential to Achieve Goals: Fair Progress towards PT goals: Progressing toward goals    Frequency    7X/week      PT Plan Current plan remains appropriate    Co-evaluation              AM-PAC PT "6 Clicks" Mobility   Outcome Measure  Help needed turning from your back to your side while in a flat bed without using bedrails?: A Lot Help needed moving from lying on your back to sitting on the side of a flat bed without using bedrails?: A Lot Help needed moving to and from a bed to a chair (including a wheelchair)?: A Lot Help needed standing up from a chair using your arms (e.g., wheelchair or bedside chair)?: A Lot Help needed to walk in hospital room?: Total Help needed climbing 3-5 steps with a railing? : Total 6 Click Score: 10    End of Session Equipment Utilized During Treatment: Gait belt;Left knee immobilizer Activity Tolerance: Patient limited by pain Patient left: with call bell/phone within reach;with nursing/sitter in room;in bed;with bed alarm set Nurse Communication: Mobility status PT Visit Diagnosis: Difficulty in walking, not elsewhere classified (R26.2);Muscle weakness (generalized) (M62.81);Pain Pain - Right/Left: Left Pain - part of body: Knee     Time: 1478-2956 PT Time Calculation (min) (ACUTE ONLY): 18 min  Charges:  $Gait Training: 8-22 mins $Therapeutic Activity: 8-22 mins                     Mauro Kaufmann PT Acute Rehabilitation Services Pager [phone] Office [phone]    Holliday Sheaffer 06/10/2020, 5:01 PM

## 2020-06-10 NOTE — Progress Notes (Signed)
Anesthesiology Note: Dr. Shelle Iron contacted me regarding Mrs Forlenza. She was apparently very sedated this morning and he was concerned that intraoperative events were potentially contributing. I reviewed her intraop record and evaluated the patient. She is awake and alert this morning, albeit a little sedated appearing. Her husband was at bedside and said she is markedly improved. She's moving all extremities and is oriented. Her main complaint is severe knee pain on the operative knee. I reassured her that this is unfortunately normal for TKA surgery, but that it would get better. She stated to me that her leg was "wrapped too tight" and that she just got sedated from the pain medicine. Now, she feels like she needs to move her knee. I said she would likely see PT soon and they'd start working with her ASAP. I found the nurse and PT and discussed with them and spoke with Dr. Shelle Iron again as well.

## 2020-06-10 NOTE — Op Note (Signed)
Becky Boyd, Becky Boyd MEDICAL RECORD [id number] ACCOUNT [id number] DATE OF BIRTH:12-30-1955 FACILITY: [facility] LOCATION: [facility] PHYSICIAN:Quinlynn Cuthbert Connye Burkitt, MD  OPERATIVE REPORT  DATE OF PROCEDURE:  06/09/2020  PREOPERATIVE DIAGNOSIS:  End-stage osteoarthrosis, medial compartment of the left knee, varus deformity.  POSTOPERATIVE DIAGNOSIS:  End-stage osteoarthrosis, medial compartment of the left knee, varus deformity.  PROCEDURE PERFORMED:  Left total knee arthroplasty utilizing DePuy Attune rotating platform 3 femur, 3 tibia, 6 insert, 32 patella.  ANESTHESIA:  Spinal.  ASSISTANT:  Andrez Grime, PA  HISTORY:  A 65 year old female with end-stage osteoarthritis medial compartment left knee failing conservative treatment, indicated for replacement of the degenerative joint with bone-on-bone arthrosis.  Risks and benefits discussed including bleeding,  infection, damage to neurovascular structures, no change in symptoms, worsening symptoms, DVT, PE, anesthetic complications, etc.  TECHNIQUE:  With the patient in supine position.  After induction of adequate spinal anesthesia and Kefzol, left lower extremity was prepped and draped and exsanguinated in usual sterile fashion.  Thigh tourniquet inflated to 225 mmHg.  Knee slightly  flexed.  Midline incision was made over the knee.  Full thickness flaps developed.  Median parapatellar arthrotomy performed.  Clear synovial fluid was evacuated.  Patella was everted, knee was flexed.  Tricompartmental osteoarthrosis with bone-on-bone  medial compartment osteophytes were noted.  Remnants of medial and lateral menisci and the ACL were removed.  I placed a notch above the femoral notch as the starting point for the intramedullary guide.  This was gently drilled.  It was irrigated.  A  T-handle reamer was then placed without difficulty.  Intramedullary guide placed a 5-degree left, 9 off the distal femur was selected.  I then performed a distal femoral cut.   Soft tissues protected at all times.  We then sized the femur; however, we  were unable to sit it flush against the distal femoral cut.  We therefore turned our attention to the tibia, which was subluxed.  We removed the remnants of medial and lateral menisci.  I cauterized the geniculates.  External alignment guide was utilized  to help the defect which was medially was approximately 7 mm laterally, bisecting the tibiotalar joint 3-degree slope parallel to the shaft.  This was then pinned.  I protected the soft tissues posteriorly with a curved Crego.  I performed a tibial cut.   Following this, removed the external alignment guide.  We sized the femur, which was sized to a 3.  This is off the anterior cortex with 3 degrees of external rotation.  This was then pinned and then I lowered the block 2 mm, which sat more flush  against the anterior cortex of the femur.  I performed the anterior, posterior and chamfer cuts, did not notch the femur.  This was better cut posteriorly.  Soft tissues protected at all times.  Attention turned back towards the tibia subluxed, used a 3  tibial baseplate maximizing the coverage just the medial aspect of the tibial tubercle.  This was then pinned.  Harvested bone from the tibial canal and impacted into the distal femoral canal.  Following this, we performed our box cut.  The box cut jig  was then used to bisect the femoral condyle as it was flushed laterally.  This was then pinned I performed our box cut.  This was performed without difficulty.  I then used a trial femur and impacted it, reduced it and placed a 5 mm insert in.  At that  point, though there was escalation of the  patient's blood pressure to over 200/110 to 120.  She was not tachycardic.  She did not desaturate.  This caused generalized bleeding within the knee.  We covered the knee with sponges and applied a pressure on  the knee.  We then subsequently increased the tourniquet time to 250.  Anesthesiologist  was called into the room.  The patient was given some fentanyl and a small dose of labetalol.  That brought the pressure back down.  Following this period of time was  approximately 10 minutes, we continued on with the case.  Everted the patella, measured to a 22, planed to a 14 utilizing the external alignment patellar jig.  I then measured the residual, which was 14, sized to a 35, drilled our peg holes medializing  those.  I placed a trial patella, reduced it and had good patellofemoral tracking.  We then tried a 6 insert, which seemed to fit better.  I then removed all our instrumentations and checked posteriorly pulsatile lavage.  Popliteus was intact.  Menisci  were absent.  Cauterized geniculate region.  Flexed the knee, subluxed the tibia.  All surfaces thoroughly dried.  We had mixed cement on the back table in appropriate fashion.  We then placed it on the tibial tray and I injected into the tibial canal,  digitally pressurizing it.  I impacted the tibial tray, a 3.  Redundant cement removed.  I cemented and impacted the femur.  I placed a 5 insert and reduced the knee and held an axial load throughout the curing of the cement.  I cemented in the patella  and clamped it.  Redundant cement removed.  After appropriate curing of the cement, we released the tourniquet 84 minutes.  Any bleeding, which was minor was cauterized.  We flexed and extended the knee.  We felt the 5 was slightly loose laterally.  We  placed in the 6 with some difficulty reducing the 6.  Following the reduction of 6, it was in full extension, full flexion, good stability with varus valgus stressing 0 to 30 degrees, negative anterior drawer.  We selected the 6.  This was then removed.   Knee was flexed and I then meticulously removed all redundant cement.  We copiously irrigated with antibiotic irrigation.  Knee was then flexed.  We selected the 6 insert.  Again, some challenge reducing the 6, but once it was reduced, I had full   extension, full flexion, good stability with varus valgus stressing 0 to 30 degrees.  Negative anterior drawer.  Next, with slight flexion we repaired the patellar arthrotomy with #1 Vicryl interrupted figure-of-eight sutures, oversewn with a running  Stratafix.  We had flexion to gravity.  Following this, an excellent patellofemoral tracking and stability.  Following this, copious irrigation, subcutaneous tissue with 2-0 and skin with staples.  Wound was dressed sterilely.  She had flexion to gravity  and excellent patellofemoral tracking.  Next, after sterile dressing, placed in immobilizer, transported to the recovery room in satisfactory condition.  The patient tolerated the procedure well.  No complications.  Blood loss 300 mL.  HN/NUANCE  D:06/09/2020 T:06/10/2020 JOB:[id number]

## 2020-06-11 ENCOUNTER — Other Ambulatory Visit: Payer: Self-pay

## 2020-06-11 LAB — CBC
HCT: 31.3 % — ABNORMAL LOW (ref 36.0–46.0)
Hemoglobin: 10.2 g/dL — ABNORMAL LOW (ref 12.0–15.0)
MCH: 33.2 pg (ref 26.0–34.0)
MCHC: 32.6 g/dL (ref 30.0–36.0)
MCV: 102 fL — ABNORMAL HIGH (ref 80.0–100.0)
Platelets: 207 10*3/uL (ref 150–400)
RBC: 3.07 MIL/uL — ABNORMAL LOW (ref 3.87–5.11)
RDW: 13.1 % (ref 11.5–15.5)
WBC: 13.6 10*3/uL — ABNORMAL HIGH (ref 4.0–10.5)
nRBC: 0 % (ref 0.0–0.2)

## 2020-06-11 NOTE — Progress Notes (Signed)
Physical Therapy Treatment Patient Details Name: Becky Boyd MRN: [id number] DOB: December 03, 1955 Today's Date: 06/11/2020    History of Present Illness Pt s/p L TKR and with hx of RA, Sjogrens, and multiple spinal surgeries including cervical fusion    PT Comments    Pt continues ltd by intermittent lethargy - requiring increased time for all tasks and constant cueing and encouragement to focus on task and attempt to participate.  Pt returned to bed - RN aware.   Follow Up Recommendations  Home health PT     Equipment Recommendations  None recommended by PT    Recommendations for Other Services       Precautions / Restrictions Precautions Precautions: Fall;Knee Required Braces or Orthoses: Knee Immobilizer - Left Knee Immobilizer - Left: Discontinue once straight leg raise with < 10 degree lag Restrictions Weight Bearing Restrictions: No Other Position/Activity Restrictions: WBAT    Mobility  Bed Mobility Overal bed mobility: Needs Assistance Bed Mobility: Supine to Sit;Sit to Supine     Supine to sit: Mod assist;+2 for physical assistance;+2 for safety/equipment Sit to supine: Max assist;+2 for safety/equipment;+2 for physical assistance   General bed mobility comments: OOB deferred  Transfers Overall transfer level: Needs assistance Equipment used: Rolling walker (2 wheeled) Transfers: Sit to/from Stand Sit to Stand: Max assist;+2 physical assistance;+2 safety/equipment;From elevated surface         General transfer comment: cues for LE management and use of UEs to self assist.  Physical assist to bring wt up and fwd and to balance in initial standing  Ambulation/Gait         Gait velocity: decr   General Gait Details: Pt stood only with RW and returned to bed with repeated attempts to sit   Stairs             Wheelchair Mobility    Modified Rankin (Stroke Patients Only)       Balance Overall balance assessment: Needs  assistance Sitting-balance support: Feet supported;Single extremity supported Sitting balance-Leahy Scale: Poor     Standing balance support: Bilateral upper extremity supported Standing balance-Leahy Scale: Poor                              Cognition Arousal/Alertness: Lethargic;Suspect due to medications Behavior During Therapy: Flat affect Overall Cognitive Status: Within Functional Limits for tasks assessed                                        Exercises      General Comments        Pertinent Vitals/Pain Pain Assessment: Faces Faces Pain Scale: Hurts even more Pain Location: L knee/thigh Pain Descriptors / Indicators: Aching;Grimacing;Sore;Tightness Pain Intervention(s): Limited activity within patient's tolerance;Monitored during session;Premedicated before session;Ice applied    Home Living                      Prior Function            PT Goals (current goals can now be found in the care plan section) Acute Rehab PT Goals Patient Stated Goal: less pain PT Goal Formulation: Patient unable to participate in goal setting Time For Goal Achievement: 06/17/20 Potential to Achieve Goals: Fair Progress towards PT goals: Not progressing toward goals - comment    Frequency    7X/week  PT Plan Current plan remains appropriate    Co-evaluation              AM-PAC PT "6 Clicks" Mobility   Outcome Measure  Help needed turning from your back to your side while in a flat bed without using bedrails?: A Lot Help needed moving from lying on your back to sitting on the side of a flat bed without using bedrails?: A Lot Help needed moving to and from a bed to a chair (including a wheelchair)?: A Lot Help needed standing up from a chair using your arms (e.g., wheelchair or bedside chair)?: A Lot Help needed to walk in hospital room?: Total Help needed climbing 3-5 steps with a railing? : Total 6 Click Score: 10    End  of Session Equipment Utilized During Treatment: Gait belt;Left knee immobilizer Activity Tolerance: Patient limited by lethargy;Patient limited by pain Patient left: with call bell/phone within reach;with nursing/sitter in room;in bed;with bed alarm set Nurse Communication: Mobility status PT Visit Diagnosis: Difficulty in walking, not elsewhere classified (R26.2);Muscle weakness (generalized) (M62.81);Pain Pain - Right/Left: Left Pain - part of body: Knee     Time: 8315-1761 PT Time Calculation (min) (ACUTE ONLY): 24 min  Charges:  $Therapeutic Activity: 23-37 mins                     Mauro Kaufmann PT Acute Rehabilitation Services Pager [phone] Office [phone]    Candida Vetter 06/11/2020, 1:37 PM

## 2020-06-11 NOTE — Progress Notes (Signed)
Physical Therapy Treatment Patient Details Name: Becky Boyd MRN: [id number] DOB: 1956/02/24 Today's Date: 06/11/2020    History of Present Illness Pt s/p L TKR and with hx of RA, Sjogrens, and multiple spinal surgeries including cervical fusion    PT Comments    Pt continues limited by pain tolerance but lethargic likely due to meds.  Therex program initiated with pt assisting vs resisting intermittently and with frequent cueing required for pt focus and participation.  Will follow up later to attempt OOB if pt alertness and ability to participate improve.   Follow Up Recommendations  Home health PT     Equipment Recommendations  None recommended by PT    Recommendations for Other Services       Precautions / Restrictions Precautions Precautions: Fall;Knee Required Braces or Orthoses: Knee Immobilizer - Left Knee Immobilizer - Left: Discontinue once straight leg raise with < 10 degree lag Restrictions Weight Bearing Restrictions: No Other Position/Activity Restrictions: WBAT    Mobility  Bed Mobility               General bed mobility comments: OOB deferred  Transfers                    Ambulation/Gait                 Stairs             Wheelchair Mobility    Modified Rankin (Stroke Patients Only)       Balance                                            Cognition Arousal/Alertness: Lethargic;Suspect due to medications Behavior During Therapy: Flat affect Overall Cognitive Status: Within Functional Limits for tasks assessed                                        Exercises Total Joint Exercises Ankle Circles/Pumps: Both;15 reps;Supine;AAROM;AROM Quad Sets: AAROM;Right;15 reps;Supine Heel Slides: Right;AAROM;PROM;15 reps Straight Leg Raises: AAROM;Right;10 reps;Supine Goniometric ROM: AAROM -12 - 30    General Comments        Pertinent Vitals/Pain Pain Assessment: Faces Faces Pain  Scale: Hurts even more Pain Location: L knee/thigh Pain Descriptors / Indicators: Aching;Grimacing;Sore;Tightness Pain Intervention(s): Limited activity within patient's tolerance;Monitored during session;Premedicated before session;Ice applied    Home Living                      Prior Function            PT Goals (current goals can now be found in the care plan section) Acute Rehab PT Goals Patient Stated Goal: less pain PT Goal Formulation: With patient Time For Goal Achievement: 06/17/20 Potential to Achieve Goals: Fair Progress towards PT goals: Not progressing toward goals - comment    Frequency    7X/week      PT Plan Current plan remains appropriate    Co-evaluation              AM-PAC PT "6 Clicks" Mobility   Outcome Measure  Help needed turning from your back to your side while in a flat bed without using bedrails?: Total Help needed moving from lying on your back to sitting on the side of a flat  bed without using bedrails?: Total Help needed moving to and from a bed to a chair (including a wheelchair)?: Total Help needed standing up from a chair using your arms (e.g., wheelchair or bedside chair)?: Total Help needed to walk in hospital room?: Total Help needed climbing 3-5 steps with a railing? : Total 6 Click Score: 6    End of Session Equipment Utilized During Treatment: Gait belt;Left knee immobilizer Activity Tolerance: Patient limited by lethargy;Patient limited by pain Patient left: with call bell/phone within reach;with nursing/sitter in room;in bed;with bed alarm set Nurse Communication: Mobility status PT Visit Diagnosis: Difficulty in walking, not elsewhere classified (R26.2);Muscle weakness (generalized) (M62.81);Pain Pain - Right/Left: Left Pain - part of body: Knee     Time: 0825-0842 PT Time Calculation (min) (ACUTE ONLY): 17 min  Charges:  $Therapeutic Exercise: 8-22 mins                     Mauro Kaufmann PT Acute  Rehabilitation Services Pager [phone] Office [phone]    Vasilios Ottaway 06/11/2020, 8:49 AM

## 2020-06-11 NOTE — Progress Notes (Signed)
    Subjective:  Patient reports pain as mild to moderate.  Denies N/V/CP/SOB. Patient is more alert this AM compared to yesterday according to nursing. She has been asked to eat more, and was able to eat a graham cracker with a soda last evening. Endorses flatus  Objective:   VITALS:   Vitals:   06/10/20 1022 06/10/20 1356 06/10/20 2100 06/11/20 0551  BP: (!) 143/78 128/75 (!) 154/84 (!) 155/82  Pulse: 97 85 86 90  Resp: 18 18 20 20   Temp: 98.2 F (36.8 C) 98.4 F (36.9 C) 97.8 F (36.6 C) 97.6 F (36.4 C)  TempSrc: Oral Oral Oral Oral  SpO2: 98% (!) 89% (!) 89% 91%  Weight:      Height:        NAD Abdomen soft, nontender Dorsiflexion and plantar flexion intact Strong pedal pulses Knee secured in immobilizer   Lab Results  Component Value Date   WBC 13.6 (H) 06/11/2020   HGB 10.2 (L) 06/11/2020   HCT 31.3 (L) 06/11/2020   MCV 102.0 (H) 06/11/2020   PLT 207 06/11/2020   BMET    Component Value Date/Time   NA 135 06/10/2020 0553   NA 142 12/14/2018 0000   K 3.7 06/10/2020 0553   CL 100 06/10/2020 0553   CO2 25 06/10/2020 0553   GLUCOSE 153 (H) 06/10/2020 0553   BUN 10 06/10/2020 0553   BUN 21 12/14/2018 0000   CREATININE 0.75 06/10/2020 0553   CREATININE 0.90 01/25/2011 1606   CALCIUM 8.8 (L) 06/10/2020 0553   GFRNONAA >60 06/10/2020 0553   GFRAA >60 10/01/2018 0517     Assessment/Plan: 2 Days Post-Op   Active Problems:   S/P TKR (total knee replacement) using cement   WBAT with walker DVT ppx: Aspirin, SCDs, TEDS PO pain control PT/OT Dispo: D/C pending     10/03/2018 06/11/2020, 10:09 AM [facility] is now [facility] [address], [place], [place] Phone: [phone] [email or web address] Facebook  [phone]

## 2020-06-12 ENCOUNTER — Inpatient Hospital Stay (HOSPITAL_COMMUNITY): Payer: Medicare Other

## 2020-06-12 ENCOUNTER — Encounter (HOSPITAL_COMMUNITY): Payer: Self-pay | Admitting: Specialist

## 2020-06-12 DIAGNOSIS — R400 Somnolence: Secondary | ICD-10-CM

## 2020-06-12 LAB — BLOOD GAS, ARTERIAL
Acid-Base Excess: 4.2 mmol/L — ABNORMAL HIGH (ref 0.0–2.0)
Bicarbonate: 27.2 mmol/L (ref 20.0–28.0)
O2 Saturation: 96.4 %
Patient temperature: 98.6
pCO2 arterial: 35.5 mmHg (ref 32.0–48.0)
pH, Arterial: 7.495 — ABNORMAL HIGH (ref 7.350–7.450)
pO2, Arterial: 90.9 mmHg (ref 83.0–108.0)

## 2020-06-12 LAB — COMPREHENSIVE METABOLIC PANEL
ALT: 24 U/L (ref 0–44)
AST: 29 U/L (ref 15–41)
Albumin: 3.3 g/dL — ABNORMAL LOW (ref 3.5–5.0)
Alkaline Phosphatase: 100 U/L (ref 38–126)
Anion gap: 14 (ref 5–15)
BUN: 15 mg/dL (ref 8–23)
CO2: 26 mmol/L (ref 22–32)
Calcium: 9 mg/dL (ref 8.9–10.3)
Chloride: 96 mmol/L — ABNORMAL LOW (ref 98–111)
Creatinine, Ser: 0.77 mg/dL (ref 0.44–1.00)
GFR, Estimated: >60 mL/min (ref >60)
Glucose, Bld: 155 mg/dL — ABNORMAL HIGH (ref 70–99)
Potassium: 3.2 mmol/L — ABNORMAL LOW (ref 3.5–5.1)
Sodium: 136 mmol/L (ref 135–145)
Total Bilirubin: 0.7 mg/dL (ref 0.3–1.2)
Total Protein: 5.7 g/dL — ABNORMAL LOW (ref 6.5–8.1)

## 2020-06-12 LAB — URINALYSIS, ROUTINE W REFLEX MICROSCOPIC
Bacteria, UA: NONE SEEN
Bilirubin Urine: NEGATIVE
Glucose, UA: NEGATIVE mg/dL
Ketones, ur: 20 mg/dL — AB
Nitrite: NEGATIVE
Protein, ur: NEGATIVE mg/dL
Specific Gravity, Urine: 1.015 (ref 1.005–1.030)
WBC, UA: >50 WBC/hpf — ABNORMAL HIGH (ref 0–5)
pH: 5 (ref 5.0–8.0)

## 2020-06-12 LAB — AMMONIA: Ammonia: 27 umol/L (ref 9–35)

## 2020-06-12 LAB — CBC
HCT: 35.9 % — ABNORMAL LOW (ref 36.0–46.0)
Hemoglobin: 11.5 g/dL — ABNORMAL LOW (ref 12.0–15.0)
MCH: 33 pg (ref 26.0–34.0)
MCHC: 32 g/dL (ref 30.0–36.0)
MCV: 102.9 fL — ABNORMAL HIGH (ref 80.0–100.0)
Platelets: 220 10*3/uL (ref 150–400)
RBC: 3.49 MIL/uL — ABNORMAL LOW (ref 3.87–5.11)
RDW: 13.5 % (ref 11.5–15.5)
WBC: 15.2 10*3/uL — ABNORMAL HIGH (ref 4.0–10.5)
nRBC: 0 % (ref 0.0–0.2)

## 2020-06-12 LAB — TSH: TSH: 1.717 u[IU]/mL (ref 0.350–4.500)

## 2020-06-12 LAB — T4, FREE: Free T4: 1.03 ng/dL (ref 0.61–1.12)

## 2020-06-12 NOTE — Progress Notes (Signed)
Physical Therapy Treatment Patient Details Name: Becky Boyd MRN: [id number] DOB: 1956/03/21 Today's Date: 06/12/2020    History of Present Illness Pt s/p L TKR and with hx of RA, Sjogrens, and multiple spinal surgeries including cervical fusion    PT Comments    POD # 3 am session Daughter present during session.  Pt AxO x 3 but difficult to sustain alertness.  Sleepy/groggy and with difficulty following commands esp getting on/off BSC.  Pt required + 2 assist to get OOB and was unable to amb this session due to "grogginess". Reported to RN and will see again after lunch  Follow Up Recommendations  Home health PT (with daughter 24/7)     Equipment Recommendations  None recommended by PT    Recommendations for Other Services       Precautions / Restrictions Precautions Precautions: Fall;Knee Required Braces or Orthoses: Knee Immobilizer - Left Restrictions Weight Bearing Restrictions: No Other Position/Activity Restrictions: WBAT    Mobility  Bed Mobility Overal bed mobility: Needs Assistance Bed Mobility: Supine to Sit     Supine to sit: Mod assist;+2 for physical assistance;+2 for safety/equipment     General bed mobility comments: required much assist esp to scoot to EOB pt slow/sluggish/drunk  Transfers Overall transfer level: Needs assistance Equipment used: Rolling walker (2 wheeled) Transfers: Sit to/from [employer] Sit to Stand: Mod assist;+2 physical assistance;+2 safety/equipment Stand pivot transfers: Max assist;+2 physical assistance;+2 safety/equipment       General transfer comment: pt groggy/impaired required repeat "hand over hand" instruction to assist from EOB to [facility] and again off BSC to recliner.  Ambulation/Gait             General Gait Details: transfer only die to lethargy (? meds)   Stairs             Wheelchair Mobility    Modified Rankin (Stroke Patients Only)       Balance                                             Cognition Arousal/Alertness: Lethargic;Suspect due to medications Behavior During Therapy: Flat affect                                   General Comments: AxO x 3 but difficult to sustain alertness and follow commands. Required "hand over hand" repeat instruction during [facility] transfer.      Exercises  10 reps AP 10 reps HS AAROM    General Comments        Pertinent Vitals/Pain Pain Assessment: 0-10 Faces Pain Scale: Hurts little more Pain Location: L knee/thigh Pain Descriptors / Indicators: Aching;Grimacing;Sore;Tightness Pain Intervention(s): Monitored during session;Premedicated before session;Repositioned;Ice applied    Home Living                      Prior Function            PT Goals (current goals can now be found in the care plan section) Progress towards PT goals: Progressing toward goals    Frequency    7X/week      PT Plan Current plan remains appropriate    Co-evaluation              AM-PAC PT "6 Clicks" Mobility   Outcome  Measure  Help needed turning from your back to your side while in a flat bed without using bedrails?: A Lot Help needed moving from lying on your back to sitting on the side of a flat bed without using bedrails?: A Lot Help needed moving to and from a bed to a chair (including a wheelchair)?: A Lot Help needed standing up from a chair using your arms (e.g., wheelchair or bedside chair)?: A Lot Help needed to walk in hospital room?: Total Help needed climbing 3-5 steps with a railing? : Total 6 Click Score: 10    End of Session Equipment Utilized During Treatment: Gait belt Activity Tolerance: Patient limited by lethargy Patient left: in chair;with call bell/phone within reach;with family/visitor present Nurse Communication: Mobility status PT Visit Diagnosis: Difficulty in walking, not elsewhere classified (R26.2);Muscle weakness (generalized)  (M62.81);Pain Pain - Right/Left: Left Pain - part of body: Knee     Time: 8527-7824 PT Time Calculation (min) (ACUTE ONLY): 35 min  Charges:  $Gait Training: 8-22 mins $Therapeutic Activity: 8-22 mins                     Felecia Shelling  PTA Acute  Rehabilitation Services Pager      [phone] Office      [phone]

## 2020-06-12 NOTE — Progress Notes (Addendum)
Subjective: 3 Days Post-Op Procedure(s) (LRB): TOTAL KNEE ARTHROPLASTY (Left) Patient reports pain as moderate.  Daughter here this morning, concerned about her mother's confusion and somnolence yesterday. She seems better this AM already.  Objective: Vital signs in last 24 hours: Temp:  [97.5 F (36.4 C)-98.4 F (36.9 C)] 97.5 F (36.4 C) (01/10 0639) Pulse Rate:  [95-102] 102 (01/10 0639) Resp:  [16-18] 18 (01/10 0639) BP: (151-172)/(84-108) 151/84 (01/10 0639) SpO2:  [96 %-100 %] 100 % (01/10 0639)  Intake/Output from previous day: 01/09 0701 - 01/10 0700 In: 300 [P.O.:300] Out: 800 [Urine:800] Intake/Output this shift: Total I/O In: 120 [P.O.:120] Out: -   Recent Labs    06/10/20 0553 06/11/20 0544 06/12/20 0721  HGB 10.5* 10.2* 11.5*   Recent Labs    06/11/20 0544 06/12/20 0721  WBC 13.6* 15.2*  RBC 3.07* 3.49*  HCT 31.3* 35.9*  PLT 207 220   Recent Labs    06/10/20 0553  NA 135  K 3.7  CL 100  CO2 25  BUN 10  CREATININE 0.75  GLUCOSE 153*  CALCIUM 8.8*   No results for input(s): LABPT, INR in the last 72 hours.  Neurologically intact ABD soft Neurovascular intact Sensation intact distally Intact pulses distally Dorsiflexion/Plantar flexion intact Incision: dressing C/D/I and scant drainage No cellulitis present Compartment soft no calf pain or sign of DVT   Assessment/Plan: 3 Days Post-Op Procedure(s) (LRB): TOTAL KNEE ARTHROPLASTY (Left) Advance diet Up with therapy D/C IV fluids ACE removed To place TED stocking today Possible D/C later today if pain well controlled and safe for D/C home Outpt PT already scheduled Discussed with Dr. Shelle Iron  Addendum:  Discussed with patient's daughter who indicates she is more somulent Takes up to 6 Vicodan per day without difficulty. Last oxy was 8 hours ago 10mg . O2 sat 97%. VSS. Patient responds to commands. Discussed with Hospitalist. Obtain ABG and ammonia. Also Bmet. Consult to  see. CN 2-12 grossly intact.No focal deficits  06/12/2020, 10:12 AM

## 2020-06-12 NOTE — Care Management Important Message (Signed)
Important Message  Patient Details IM Letter given to the Patient. Name: Becky Boyd MRN: [id number] Date of Birth: [birth year]   Medicare Important Message Given:  Yes     Addasyn, Mcbreen 06/12/2020, 1:12 PM

## 2020-06-12 NOTE — Progress Notes (Addendum)
Met briefly with pt and daughter to review potential dc needs.  Pt is part of [facility] and confirms she has all needed DME.  Plan for OPPT at [facility].  No TOC needs.  Aileen Amore, LCSW   Addendum 1/12 :  Now to have HHPT via [facility]

## 2020-06-12 NOTE — Consult Note (Signed)
Medical Consultation  Becky Boyd UKG:[id number] DOB: February 01, 1956 DOA: 06/09/2020 PCP: Sheliah Hatch, MD   Requesting physician: Dr. Shelle Iron Date of consultation: 06/12/20 Reason for consultation: Somnolence  Impression/Recommendations AMS     - ABG w/o CO2 rentention, ammonia is ok     - she does have an elevated white count w/o fever; check UA, CXR.     - non-focal exam, but will check CTH     - check TSH, FT4/3     - per family, ortho to talk to her rheumatologist about increase in steroids d/t SLE/sjogren's hx; will await outcome of that conversation.      - initially groggy but picked up towards the end of my exam; could be that it's taking this long to clear up from surgery/pain meds     - would be helpful to keep her sleep cycle in check (bright during day, dark at night) and keep familiar object/people around  [facility] will follow-up again tomorrow. Please contact me if I can be of assistance in the meanwhile. Thank you for this consultation.  Chief Complaint: somnolence  HPI:  Becky Boyd is a 65 y.o. female with medical history significant of SLE, Sjogren's, hypothyroidism. Presented for knee replacement 3 days ago. Successfully completed procedure. Since the procedure, she has been groggy. Initially thought to be d/t pain medicine. They cut back greatly on medication, but that has not brought her back to her baseline activity/mentation per her daughter. Periods of confusion as well. Never had anything happen like this before. Never had this response to pain meds. [facility] was called for evaluation.   Review of Systems:  Denies CP, dyspnea, palpitations, N/V/D/HA. Remainder ROS is negative for all not mentioned in HPI.   Past Medical History:  Diagnosis Date  . Aneurysm (HCC)   . Anorexia   . Dysphagia   . Fibromyalgia   . Fibromyalgia   . GERD (gastroesophageal reflux disease)   . Hashimoto thyroiditis   . History of kidney stones    hx of multiple kidney stones   .  Hypothyroidism   . Kidney stones   . Lupus (HCC)   . Palpitations   . Rheumatoid arthritis (HCC)   . Sjogren's disease [facility])    Past Surgical History:  Procedure Laterality Date  . ABDOMINAL HYSTERECTOMY    . CERVICAL FUSION  2004   C4-C5 with Instrumentation  . CERVICAL FUSION  1990   C3-C4  . cervical rods    . CHOLECYSTECTOMY    . kidney stone    . l5-s1 surgery    . LUMBAR MICRODISCECTOMY  2004   Bilateral L5-S1  . TOTAL KNEE ARTHROPLASTY Left 06/09/2020   Procedure: TOTAL KNEE ARTHROPLASTY;  Surgeon: Jene Every, MD;  Location: [facility];  Service: Orthopedics;  Laterality: Left;   Social History:  reports that she has never smoked. She has never used smokeless tobacco. She reports that she does not drink alcohol and does not use drugs.  Allergies  Allergen Reactions  . Plaquenil [Hydroxychloroquine Sulfate] Other (See Comments)    Prolonged QT interval, skin feels like bee stings   Family History  Problem Relation Age of Onset  . Diabetes Sister   . Breast cancer Paternal Aunt   . COPD Mother   . Heart disease Father   . COPD Brother   . Aneurysm Paternal Aunt   . Diabetes Other     Prior to Admission medications   Medication Sig Start Date End Date Taking? Authorizing Provider  acetaminophen (TYLENOL) 500 MG tablet Take 1,000-1,500 mg by mouth every 8 (eight) hours as needed for moderate pain.   Yes [provider]  aspirin EC 81 MG tablet Take 1 tablet (81 mg total) by mouth daily. 06/09/20  Yes Jene Every, MD  aspirin EC 81 MG tablet Take 1 tablet (81 mg total) by mouth 2 (two) times daily. 06/09/20  Yes Jene Every, MD  atorvastatin (LIPITOR) 20 MG tablet TAKE ONE TABLET BY MOUTH ONE TIME DAILY Patient taking differently: Take 20 mg by mouth daily. 03/08/20  Yes Sheliah Hatch, MD  baclofen (LIORESAL) 10 MG tablet TAKE ONE TABLET BY MOUTH THREE TIMES DAILY 06/08/20  Yes Sheliah Hatch, MD  docusate sodium (COLACE) 100 MG capsule Take 1  capsule (100 mg total) by mouth 2 (two) times daily as needed for mild constipation. 06/09/20  Yes Jene Every, MD  DULoxetine (CYMBALTA) 60 MG capsule Take 60 mg by mouth at bedtime. 11/01/14  Yes [provider]  HYDROcodone-acetaminophen (NORCO/VICODIN) 5-325 MG per tablet Take 1-2 tablets by mouth every 6 (six) hours as needed. Patient taking differently: Take 1-2 tablets by mouth every 6 (six) hours as needed for moderate pain. 10/11/13  Yes Wofford, Jonny Ruiz, MD  ibuprofen (ADVIL) 200 MG tablet Take 400-600 mg by mouth every 6 (six) hours as needed for moderate pain.   Yes [provider]  levothyroxine (SYNTHROID) 88 MCG tablet Take 1 tablet (88 mcg total) by mouth daily. 02/21/20  Yes Sheliah Hatch, MD  meloxicam (MOBIC) 15 MG tablet Take 15 mg by mouth daily.   Yes [provider]  oxyCODONE-acetaminophen (PERCOCET) 5-325 MG tablet Take 1-2 tablets by mouth every 4 (four) hours as needed for severe pain. 06/09/20  Yes Jene Every, MD  pantoprazole (PROTONIX) 40 MG tablet Take 1 tablet (40 mg total) by mouth daily. 02/17/20  Yes Sheliah Hatch, MD  polyethylene glycol (MIRALAX / GLYCOLAX) 17 g packet Take 17 g by mouth daily. 06/09/20  Yes Jene Every, MD  predniSONE (DELTASONE) 5 MG tablet Take 5 mg by mouth daily.   Yes [provider]  sulfaSALAzine (AZULFIDINE) 500 MG tablet Take 1,000 mg by mouth 2 (two) times daily. 01/06/17  Yes [provider]  Carboxymethylcellul-Glycerin (LUBRICATING EYE DROPS OP) Place 1 drop into both eyes daily as needed (dry eyes).    [provider]  Multiple Vitamin (MULTIVITAMIN WITH MINERALS) TABS tablet Take 1 tablet by mouth daily. Patient not taking: Reported on 06/01/2020 10/01/18   Osvaldo Shipper, MD  predniSONE (DELTASONE) 10 MG tablet 3 tabs x3 days and then 2 tabs x3 days and then 1 tab x3 days.  Take w/ food. Patient not taking: Reported on 06/01/2020 02/15/19   Sheliah Hatch, MD    Physical Exam: Blood pressure (!) 141/76, pulse 98, temperature (!) 97.5 F (36.4 C), temperature source Oral, resp. rate 15, height 5\' 3"  (1.6 m), weight 69.4 kg, SpO2 97 %. Vitals:   06/12/20 1422 06/12/20 1558  BP: 107/83 (!) 141/76  Pulse: (!) 114 98  Resp: 16 15  Temp:  (!) 97.5 F (36.4 C)  SpO2: 92% 97%    General: 65 y.o. female resting in bed in NAD Eyes: PERRL, normal sclera ENMT: Nares patent w/o discharge, orophaynx clear, dentition normal, ears w/o discharge/lesions/ulcers Neck: Supple, trachea midline Cardiovascular: RRR, +S1, S2, no m/g/r, equal pulses throughout Respiratory: CTABL, no w/r/r, normal WOB GI: BS+, NDNT, no masses noted, no organomegaly noted MSK: No e/c/c  Neuro: A&O x 3, no focal deficits Psyc: Initially slow and groggy interaction, but picked up pace as we continued interview/exam; flat affect, cooperative  Labs on Admission:  Basic Metabolic Panel: Recent Labs  Lab 06/06/20 1334 06/10/20 0553 06/12/20 1233  NA 141 135 136  K 4.0 3.7 3.2*  CL 105 100 96*  CO2 26 25 26   GLUCOSE 113* 153* 155*  BUN 20 10 15   CREATININE 0.81 0.75 0.77  CALCIUM 9.6 8.8* 9.0   Liver Function Tests: Recent Labs  Lab 06/12/20 1233  AST 29  ALT 24  ALKPHOS 100  BILITOT 0.7  PROT 5.7*  ALBUMIN 3.3*   No results for input(s): LIPASE, AMYLASE in the last 168 hours. Recent Labs  Lab 06/12/20 1233  AMMONIA 27   CBC: Recent Labs  Lab 06/06/20 1334 06/10/20 0553 06/11/20 0544 06/12/20 0721  WBC 7.5 14.0* 13.6* 15.2*  HGB 11.6* 10.5* 10.2* 11.5*  HCT 36.9 33.6* 31.3* 35.9*  MCV 102.8* 106.7* 102.0* 102.9*  PLT 241 227 207 220   Cardiac Enzymes: No results for input(s): CKTOTAL, CKMB, CKMBINDEX, TROPONINI in the last 168 hours. BNP: Invalid input(s): POCBNP CBG: Recent Labs  Lab 06/10/20 2057  GLUCAP 82    Radiological Exams on Admission: No results found.  Time spent: 35 minutes  Terrance Lanahan A Jama Mcmiller DO [facility]  If  7PM-7AM, please contact night-coverage [email or web address] 06/12/2020, 4:50 PM

## 2020-06-12 NOTE — Progress Notes (Signed)
Physical Therapy Treatment Patient Details Name: Becky Boyd MRN: [id number] DOB: 01-20-1956 Today's Date: 06/12/2020    History of Present Illness Pt s/p L TKR and with hx of RA, Sjogrens, and multiple spinal surgeries including cervical fusion    PT Comments    POD # 3 Pt has had NO pain meds since this morning and remains sleepy/groggy/lethargic Assisted from recliner to [facility] then back to bed.   Pt NOT progressing with her mobility.     Follow Up Recommendations  Home health PT (with daughter 24/7)     Equipment Recommendations  None recommended by PT    Recommendations for Other Services       Precautions / Restrictions Precautions Precautions: Fall;Knee Required Braces or Orthoses: Knee Immobilizer - Left Restrictions Weight Bearing Restrictions: No Other Position/Activity Restrictions: WBAT    Mobility  Bed Mobility Overal bed mobility: Needs Assistance Bed Mobility: Sit to Supine     Supine to sit: Mod assist;+2 for physical assistance;+2 for safety/equipment Sit to supine: Total assist;Max assist   General bed mobility comments: required increased assist back to bed due to lethargy  Transfers Overall transfer level: Needs assistance Equipment used: Rolling walker (2 wheeled) Transfers: Sit to/from [employer] Transfers Sit to Stand: Max assist Stand pivot transfers: Max assist       General transfer comment: pt groggy/impaired required repeat "hand over hand" instruction to assist from recliner  to [facility] and again off BSC back to bed  Ambulation/Gait             General Gait Details: transfer only do to lethargy (? meds)   Stairs             Wheelchair Mobility    Modified Rankin (Stroke Patients Only)       Balance                                            Cognition Arousal/Alertness: Lethargic;Suspect due to medications Behavior During Therapy: Flat affect Overall Cognitive Status:  Impaired/Different from baseline Area of Impairment: Following commands;Safety/judgement;Awareness;Problem solving                       Following Commands: Follows one step commands inconsistently       General Comments: AxO x 3 but difficult to sustain alertness and follow commands. Required "hand over hand" repeat instruction during [facility] transfer.      Exercises      General Comments        Pertinent Vitals/Pain Pain Assessment: Faces Faces Pain Scale: Hurts little more Pain Location: L knee/thigh Pain Descriptors / Indicators: Aching;Grimacing;Sore;Tightness Pain Intervention(s): Monitored during session;Repositioned;Ice applied    Home Living                      Prior Function            PT Goals (current goals can now be found in the care plan section) Progress towards PT goals: Progressing toward goals    Frequency    7X/week      PT Plan Current plan remains appropriate    Co-evaluation              AM-PAC PT "6 Clicks" Mobility   Outcome Measure  Help needed turning from your back to your side while in a flat bed without using bedrails?:  A Lot Help needed moving from lying on your back to sitting on the side of a flat bed without using bedrails?: A Lot Help needed moving to and from a bed to a chair (including a wheelchair)?: A Lot Help needed standing up from a chair using your arms (e.g., wheelchair or bedside chair)?: A Lot Help needed to walk in hospital room?: Total Help needed climbing 3-5 steps with a railing? : Total 6 Click Score: 10    End of Session Equipment Utilized During Treatment: Gait belt Activity Tolerance: Patient limited by lethargy Patient left: in chair;with call bell/phone within reach;with family/visitor present Nurse Communication: Mobility status PT Visit Diagnosis: Difficulty in walking, not elsewhere classified (R26.2);Muscle weakness (generalized) (M62.81);Pain Pain - Right/Left: Left Pain -  part of body: Knee     Time: 1340-1405 PT Time Calculation (min) (ACUTE ONLY): 25 min  Charges:  $Therapeutic Activity: 23-37 mins                     Felecia Shelling  PTA Acute  Rehabilitation Services Pager      [phone] Office      [phone]

## 2020-06-13 ENCOUNTER — Other Ambulatory Visit: Payer: Self-pay

## 2020-06-13 DIAGNOSIS — Z96651 Presence of right artificial knee joint: Secondary | ICD-10-CM | POA: Diagnosis not present

## 2020-06-13 LAB — CBC WITH DIFFERENTIAL/PLATELET
Abs Immature Granulocytes: 0.03 10*3/uL (ref 0.00–0.07)
Basophils Absolute: 0.1 10*3/uL (ref 0.0–0.1)
Basophils Relative: 1 %
Eosinophils Absolute: 0.1 10*3/uL (ref 0.0–0.5)
Eosinophils Relative: 1 %
HCT: 33.3 % — ABNORMAL LOW (ref 36.0–46.0)
Hemoglobin: 10.5 g/dL — ABNORMAL LOW (ref 12.0–15.0)
Immature Granulocytes: 0 %
Lymphocytes Relative: 9 %
Lymphs Abs: 1 10*3/uL (ref 0.7–4.0)
MCH: 32.4 pg (ref 26.0–34.0)
MCHC: 31.5 g/dL (ref 30.0–36.0)
MCV: 102.8 fL — ABNORMAL HIGH (ref 80.0–100.0)
Monocytes Absolute: 1.3 10*3/uL — ABNORMAL HIGH (ref 0.1–1.0)
Monocytes Relative: 12 %
Neutro Abs: 8.5 10*3/uL — ABNORMAL HIGH (ref 1.7–7.7)
Neutrophils Relative %: 77 %
Platelets: 239 10*3/uL (ref 150–400)
RBC: 3.24 MIL/uL — ABNORMAL LOW (ref 3.87–5.11)
RDW: 13.6 % (ref 11.5–15.5)
WBC: 10.9 10*3/uL — ABNORMAL HIGH (ref 4.0–10.5)
nRBC: 0 % (ref 0.0–0.2)

## 2020-06-13 LAB — MAGNESIUM: Magnesium: 2.2 mg/dL (ref 1.7–2.4)

## 2020-06-13 LAB — BASIC METABOLIC PANEL
Anion gap: 13 (ref 5–15)
BUN: 18 mg/dL (ref 8–23)
CO2: 27 mmol/L (ref 22–32)
Calcium: 8.8 mg/dL — ABNORMAL LOW (ref 8.9–10.3)
Chloride: 97 mmol/L — ABNORMAL LOW (ref 98–111)
Creatinine, Ser: 0.66 mg/dL (ref 0.44–1.00)
GFR, Estimated: >60 mL/min (ref >60)
Glucose, Bld: 88 mg/dL (ref 70–99)
Potassium: 2.9 mmol/L — ABNORMAL LOW (ref 3.5–5.1)
Sodium: 137 mmol/L (ref 135–145)

## 2020-06-13 MED ORDER — HYDROCODONE-ACETAMINOPHEN 5-325 MG PO TABS
1.0000 | ORAL_TABLET | ORAL | Status: DC | PRN
  Administered 2020-06-13 (×2): 1 via ORAL
  Administered 2020-06-14 (×2): 2 via ORAL
  Administered 2020-06-14: 04:00:00 1 via ORAL
  Filled 2020-06-13 (×2): qty 2
  Filled 2020-06-13 (×3): qty 1

## 2020-06-13 MED ORDER — SODIUM CHLORIDE 0.9 % IV SOLN
1.0000 g | INTRAVENOUS | Status: DC
  Administered 2020-06-13 – 2020-06-14 (×2): 1 g via INTRAVENOUS
  Filled 2020-06-13: qty 1
  Filled 2020-06-13: qty 10

## 2020-06-13 MED ORDER — BACLOFEN 10 MG PO TABS: 10.0000 mg | ORAL_TABLET | Freq: Three times a day (TID) | ORAL | Status: DC | PRN

## 2020-06-13 MED ORDER — POTASSIUM CHLORIDE CRYS ER 20 MEQ PO TBCR
40.0000 meq | EXTENDED_RELEASE_TABLET | ORAL | Status: AC
  Administered 2020-06-13 (×2): 40 meq via ORAL
  Filled 2020-06-13: qty 2

## 2020-06-13 NOTE — Progress Notes (Addendum)
Subjective: 4 Days Post-Op Procedure(s) (LRB): TOTAL KNEE ARTHROPLASTY (Left) Patient reports pain as 4 on 0-10 scale.   Denies CP or SOB.  Voiding without difficulty. Positive flatus. Alert.Not somnolent.esponds to commands Objective: Vital signs in last 24 hours: Temp:  [97.5 F (36.4 C)-99.4 F (37.4 C)] 97.6 F (36.4 C) (01/11 0500) Pulse Rate:  [98-114] 100 (01/11 0500) Resp:  [15-20] 18 (01/11 0500) BP: (107-141)/(60-86) 131/86 (01/11 0500) SpO2:  [91 %-99 %] 96 % (01/11 0500)  Intake/Output from previous day: 01/10 0701 - 01/11 0700 In: 490 [P.O.:490] Out: -  Intake/Output this shift: No intake/output data recorded.  Recent Labs    06/11/20 0544 06/12/20 0721  HGB 10.2* 11.5*   Recent Labs    06/11/20 0544 06/12/20 0721  WBC 13.6* 15.2*  RBC 3.07* 3.49*  HCT 31.3* 35.9*  PLT 207 220   Recent Labs    06/12/20 1233  NA 136  K 3.2*  CL 96*  CO2 26  BUN 15  CREATININE 0.77  GLUCOSE 155*  CALCIUM 9.0   No results for input(s): LABPT, INR in the last 72 hours.  UA possible uti.   Neurologically intact Sensation intact distally Intact pulses distally Dorsiflexion/Plantar flexion intact Incision: scant drainage Compartment soft  Assessment/Plan: 4 Days Post-Op Procedure(s) (LRB): TOTAL KNEE ARTHROPLASTY (Left) Up with therapy  Try home hydrocodone and or tramadol. Encourage to range the knee. Appreciate medical consult. Labs pending. Possible d/c to home Will discuss with medicine possible uti.    Javier Docker 06/13/2020, 7:10 AM

## 2020-06-13 NOTE — Progress Notes (Signed)
Physical Therapy Treatment Patient Details Name: Becky Boyd MRN: [id number] DOB: 05/10/1956 Today's Date: 06/13/2020    History of Present Illness Pt s/p L TKR and with hx of RA, Sjogrens, and multiple spinal surgeries including cervical fusion    PT Comments    POD # 4  MUCH improved cognition  AxO x 3 and fully awake Daughter present and fully involved and very helpful. General bed mobility comments: demonstarted and instructed on how to use a belt to self assist L LE off bed.  Required increased time and 25% VC's on proper tech  General transfer comment: had daughter "hands on" asssist under Therapist direction with 50% VC's on proper hand placement and safety with turns assisting from EOB to [facility] then again off BSC to amb.  Instructed on safe handling and use of gait belt.General Gait Details: imrovement with mobility having daughter "hands on" assist under Therapist direction pt was able to advance gait to 12 feet with 25% VC's on proper sequencing and proper walker to self distance.  Performed some TE's esp knee flex and discussed/educated on knee extension positioning as pt tends to lay ext rotated and flexed.  Will see pt again this afternoon.    Follow Up Recommendations  Home health PT  to daughter's home    Equipment Recommendations  None recommended by PT (has everything from prior surgery)    Recommendations for Other Services       Precautions / Restrictions Precautions Precautions: Fall;Knee Precaution Comments: instructed on proper positioning full extension at rest Restrictions Weight Bearing Restrictions: No Other Position/Activity Restrictions: WBAT    Mobility  Bed Mobility Overal bed mobility: Needs Assistance Bed Mobility: Supine to Sit     Supine to sit: Min assist     General bed mobility comments: demonstarted and instructed on how to use a belt to self assist L LE off bed.  Required increased time and 25% VC's on proper tech  Transfers Overall  transfer level: Needs assistance Equipment used: Rolling walker (2 wheeled) Transfers: Sit to/from [employer] Sit to Stand: Mod assist Stand pivot transfers: Mod assist       General transfer comment: had daughter "hands on" asssist under Therapist direction with 50% VC's on proper hand placement and safety with turns assisting from EOB to [facility] then again off BSC to amb.  Instructed on safe handling and use of gait belt.  Ambulation/Gait Ambulation/Gait assistance: Min assist;Mod assist Gait Distance (Feet): 12 Feet Assistive device: Rolling walker (2 wheeled) Gait Pattern/deviations: Step-to pattern;Decreased step length - right;Shuffle;Trunk flexed;Decreased step length - left;Decreased stance time - left Gait velocity: decr   General Gait Details: imrovement with mobility having daughter "hands on" assist under Therapist direction pt was able to advance gait to 12 feet with 25% VC's on proper sequencing and proper walker to self distance.   Stairs             Wheelchair Mobility    Modified Rankin (Stroke Patients Only)       Balance                                            Cognition Arousal/Alertness: Awake/alert Behavior During Therapy: WFL for tasks assessed/performed Overall Cognitive Status: Within Functional Limits for tasks assessed  General Comments: MUCH improved AxO x 3 and fully awake      Exercises      General Comments        Pertinent Vitals/Pain Pain Assessment: 0-10 Pain Score: 7  Pain Location: L knee/thigh Pain Descriptors / Indicators: Aching;Grimacing;Sore;Tightness Pain Intervention(s): Monitored during session;Repositioned;Ice applied;Premedicated before session    Home Living                      Prior Function            PT Goals (current goals can now be found in the care plan section) Progress towards PT goals: Progressing toward  goals    Frequency    7X/week      PT Plan Current plan remains appropriate    Co-evaluation              AM-PAC PT "6 Clicks" Mobility   Outcome Measure  Help needed turning from your back to your side while in a flat bed without using bedrails?: A Little Help needed moving from lying on your back to sitting on the side of a flat bed without using bedrails?: A Lot Help needed moving to and from a bed to a chair (including a wheelchair)?: A Lot Help needed standing up from a chair using your arms (e.g., wheelchair or bedside chair)?: A Lot Help needed to walk in hospital room?: A Lot Help needed climbing 3-5 steps with a railing? : Total 6 Click Score: 12    End of Session Equipment Utilized During Treatment: Gait belt Activity Tolerance: Patient tolerated treatment well Patient left: in chair;with call bell/phone within reach;with family/visitor present Nurse Communication: Mobility status PT Visit Diagnosis: Difficulty in walking, not elsewhere classified (R26.2);Muscle weakness (generalized) (M62.81);Pain Pain - Right/Left: Left Pain - part of body: Knee      Time: 1020-1048 PT Time Calculation (min) (ACUTE ONLY): 28 min  Charges:  $Gait Training: 8-22 mins $Therapeutic Activity: 8-22 mins                     Felecia Shelling  PTA Acute  Rehabilitation Services Pager      [phone] Office      [phone]

## 2020-06-13 NOTE — Progress Notes (Signed)
Physical Therapy Treatment Patient Details Name: Becky Boyd MRN: [id number] DOB: 1956/02/13 Today's Date: 06/13/2020    History of Present Illness Pt s/p L TKR and with hx of RA, Sjogrens, and multiple spinal surgeries including cervical fusion    PT Comments    POD #4 pm session Cognition continues to improve Daughter is present and fully involved plus very helpful  Assisted out of recliner to [facility].  General transfer comment: had daughter "hands on" asssist under Therapist direction with 50% VC's on proper hand placement and safety with turns assisting from recliner  to [facility] then again off BSC to amb.  Instructed on safe handling and use of gait belt. General Gait Details: imrovement with mobility having daughter "hands on" assist under Therapist direction pt was able to advance gait to 22 feet with 25% VC's on proper sequencing and proper walker to self distance. General bed mobility comments: had daughter "hands on" asssist pt back to bed, performed some TE's  and position to comfort as well as L LE in extension followed by ICE.   Follow Up Recommendations  Home health PT     Equipment Recommendations  None recommended by PT (has everything from prior surgery)    Recommendations for Other Services       Precautions / Restrictions Precautions Precautions: Fall;Knee Precaution Comments: instructed on proper positioning full extension at rest Restrictions Weight Bearing Restrictions: No Other Position/Activity Restrictions: WBAT    Mobility  Bed Mobility Overal bed mobility: Needs Assistance Bed Mobility: Sit to Supine     Supine to sit: Min assist Sit to supine: Mod assist;Max assist   General bed mobility comments: had daughter "hands on" asssist pt back to bed and position to comfort as well as L LE in extension  Transfers Overall transfer level: Needs assistance Equipment used: Rolling walker (2 wheeled) Transfers: Sit to/from [employer] Sit to  Stand: Mod assist Stand pivot transfers: Mod assist       General transfer comment: had daughter "hands on" asssist under Therapist direction with 50% VC's on proper hand placement and safety with turns assisting from recliner  to [facility] then again off BSC to amb.  Instructed on safe handling and use of gait belt.  Ambulation/Gait Ambulation/Gait assistance: Min assist;Mod assist Gait Distance (Feet): 22 Feet Assistive device: Rolling walker (2 wheeled) Gait Pattern/deviations: Step-to pattern;Decreased step length - right;Shuffle;Trunk flexed;Decreased step length - left;Decreased stance time - left Gait velocity: decr   General Gait Details: imrovement with mobility having daughter "hands on" assist under Therapist direction pt was able to advance gait to 22 feet with 25% VC's on proper sequencing and proper walker to self distance.   Stairs             Wheelchair Mobility    Modified Rankin (Stroke Patients Only)       Balance                                            Cognition Arousal/Alertness: Awake/alert Behavior During Therapy: WFL for tasks assessed/performed Overall Cognitive Status: Within Functional Limits for tasks assessed                                 General Comments: MUCH improved AxO x 3 and fully awake      Exercises  General Comments        Pertinent Vitals/Pain Pain Assessment: 0-10 Pain Score: 7  Pain Location: L knee/thigh Pain Descriptors / Indicators: Aching;Grimacing;Sore;Tightness Pain Intervention(s): Monitored during session;Repositioned;Ice applied;Premedicated before session    Home Living                      Prior Function            PT Goals (current goals can now be found in the care plan section) Progress towards PT goals: Progressing toward goals    Frequency    7X/week      PT Plan Current plan remains appropriate    Co-evaluation               AM-PAC PT "6 Clicks" Mobility   Outcome Measure  Help needed turning from your back to your side while in a flat bed without using bedrails?: A Little Help needed moving from lying on your back to sitting on the side of a flat bed without using bedrails?: A Lot Help needed moving to and from a bed to a chair (including a wheelchair)?: A Lot Help needed standing up from a chair using your arms (e.g., wheelchair or bedside chair)?: A Lot Help needed to walk in hospital room?: A Lot Help needed climbing 3-5 steps with a railing? : Total 6 Click Score: 12    End of Session Equipment Utilized During Treatment: Gait belt Activity Tolerance: Patient tolerated treatment well Patient left: in chair;with call bell/phone within reach;with family/visitor present Nurse Communication: Mobility status PT Visit Diagnosis: Difficulty in walking, not elsewhere classified (R26.2);Muscle weakness (generalized) (M62.81);Pain Pain - Right/Left: Left Pain - part of body: Knee     Time: 9373-4287 PT Time Calculation (min) (ACUTE ONLY): 26 min  Charges:  $Gait Training: 8-22 mins $Therapeutic Activity: 8-22 mins                     Felecia Shelling  PTA Acute  Rehabilitation Services Pager      [phone] Office      [phone]

## 2020-06-13 NOTE — Progress Notes (Signed)
PROGRESS NOTE    Becky Boyd  XFG:[id number] DOB: 02/17/1956 DOA: 06/09/2020 PCP: Sheliah Hatch, MD   Chief Complain: Consulted for somnolence after knee surgery  Brief Narrative: Patient is a 65 year old female with history of SLE, Sjogren's syndrome, hypothyroidism who was admitted for left knee replacement.  She underwent total knee arthroplasty on the left.  Postoperatively, she was found to be somnolent.  TRH was consulted for this.  Thought to be secondary to pain medication/opioids.  Urinalysis was suggestive of UTI.  Started on ceftriaxone, urine culture ordered.  Assessment & Plan:   Active Problems:   S/P TKR (total knee replacement) using cement   Altered mental status/somnolence: ABG did not show CO2 retention.  Ammonia is normal.  She was on opioids for pain management, on baclofen, on Reglan.  Try to minimize opioids.  CT head did not show any acute intracranial abnormality.  TSH is normal.  Mental status has improved today after cutting down on opiates.    UTI:  urinalysis is suggestive of UTI with significant leukocytes.Has mild leukocytosis.  Afebrile.  She denies any dysuria, continue ceftriaxone ,pending urine culture  Severe hypokalemia: We will supplement with potassium, check magnesium level.  Hypothyroidism: Continue Synthyroid  Status post left knee replacement: Underwent total knee replacement.  Management as per orthopedics.  History of SLE/Sjogren: Follow-up with rheumatology as an outpatient.  On prednisone, sulfasalazine .         DVT prophylaxis:Aspirin Procedures: Total knee replacement  Antimicrobials:  Anti-infectives (From admission, onward)   Start     Dose/Rate Route Frequency Ordered Stop   06/13/20 0800  cefTRIAXone (ROCEPHIN) 1 g in sodium chloride 0.9 % 100 mL IVPB        1 g 200 mL/hr over 30 Minutes Intravenous Every 24 hours 06/13/20 0730     06/09/20 2115  ceFAZolin (ANCEF) IVPB 1 g/50 mL premix        1 g 100 mL/hr over 30  Minutes Intravenous Every 6 hours 06/09/20 2018 06/10/20 0917   06/09/20 1053  ceFAZolin (ANCEF) 2-4 GM/100ML-% IVPB       Note to Pharmacy: Montel Clock   : cabinet override      06/09/20 1053 06/09/20 1337   06/09/20 1030  ceFAZolin (ANCEF) IVPB 2g/100 mL premix        2 g 200 mL/hr over 30 Minutes Intravenous On call to O.R. 06/09/20 1017 06/09/20 1339      Subjective: Patient seen and examined the bedside this morning.  Alert and oriented.  Denies any complaints.  Mental status has returned to baseline.  Objective: Vitals:   06/12/20 1558 06/12/20 2019 06/13/20 0045 06/13/20 0500  BP: (!) 141/76 120/72 119/81 131/86  Pulse: 98 100 99 100  Resp: 15 17 18 18   Temp: (!) 97.5 F (36.4 C) 98.2 F (36.8 C) 98.2 F (36.8 C) 97.6 F (36.4 C)  TempSrc: Oral Oral Oral Oral  SpO2: 97% 95% 91% 96%  Weight:      Height:        Intake/Output Summary (Last 24 hours) at 06/13/2020 0742 Last data filed at 06/13/2020 0546 Gross per 24 hour  Intake 490 ml  Output -  Net 490 ml   Filed Weights   06/09/20 1105  Weight: 69.4 kg    Examination:  General exam: Appears calm and comfortable ,Not in distress,average built HEENT:PERRL,Oral mucosa moist, Ear/Nose normal on gross exam Respiratory system: Bilateral equal air entry, normal vesicular breath sounds, no wheezes or  crackles  Cardiovascular system: S1 & S2 heard, RRR. No JVD, murmurs, rubs, gallops or clicks. No pedal edema. Gastrointestinal system: Abdomen is nondistended, soft and nontender. No organomegaly or masses felt. Normal bowel sounds heard. Central nervous system: Alert and oriented. No focal neurological deficits. Extremities: No edema, no clubbing ,no cyanosis, right knee surgical wound skin: No rashes, lesions or ulcers,no icterus ,no pallor   Data Reviewed: I have personally reviewed following labs and imaging studies  CBC: Recent Labs  Lab 06/06/20 1334 06/10/20 0553 06/11/20 0544 06/12/20 0721  WBC  7.5 14.0* 13.6* 15.2*  HGB 11.6* 10.5* 10.2* 11.5*  HCT 36.9 33.6* 31.3* 35.9*  MCV 102.8* 106.7* 102.0* 102.9*  PLT 241 227 207 220   Basic Metabolic Panel: Recent Labs  Lab 06/06/20 1334 06/10/20 0553 06/12/20 1233  NA 141 135 136  K 4.0 3.7 3.2*  CL 105 100 96*  CO2 26 25 26   GLUCOSE 113* 153* 155*  BUN 20 10 15   CREATININE 0.81 0.75 0.77  CALCIUM 9.6 8.8* 9.0   GFR: Estimated Creatinine Clearance: 64.6 mL/min (by C-G formula based on SCr of 0.77 mg/dL). Liver Function Tests: Recent Labs  Lab 06/12/20 1233  AST 29  ALT 24  ALKPHOS 100  BILITOT 0.7  PROT 5.7*  ALBUMIN 3.3*   No results for input(s): LIPASE, AMYLASE in the last 168 hours. Recent Labs  Lab 06/12/20 1233  AMMONIA 27   Coagulation Profile: Recent Labs  Lab 06/06/20 1334  INR 1.0   Cardiac Enzymes: No results for input(s): CKTOTAL, CKMB, CKMBINDEX, TROPONINI in the last 168 hours. BNP (last 3 results) No results for input(s): PROBNP in the last 8760 hours. HbA1C: No results for input(s): HGBA1C in the last 72 hours. CBG: Recent Labs  Lab 06/10/20 2057  GLUCAP 82   Lipid Profile: No results for input(s): CHOL, HDL, LDLCALC, TRIG, CHOLHDL, LDLDIRECT in the last 72 hours. Thyroid Function Tests: Recent Labs    06/12/20 1809  TSH 1.717  FREET4 1.03   Anemia Panel: No results for input(s): VITAMINB12, FOLATE, FERRITIN, TIBC, IRON, RETICCTPCT in the last 72 hours. Sepsis Labs: No results for input(s): PROCALCITON, LATICACIDVEN in the last 168 hours.  Recent Results (from the past 240 hour(s))  Surgical pcr screen     Status: None   Collection Time: 06/06/20  1:34 PM   Specimen: Nasal Mucosa; Nasal Swab  Result Value Ref Range Status   MRSA, PCR NEGATIVE NEGATIVE Final   Staphylococcus aureus NEGATIVE NEGATIVE Final    Comment: (NOTE) The Xpert SA Assay (FDA approved for NASAL specimens in patients 52 years of age and older), is one component of a comprehensive surveillance  program. It is not intended to diagnose infection nor to guide or monitor treatment. Performed at [facility], [address], [place], [place]   SARS CORONAVIRUS 2 (TAT 6-24 HRS) Nasopharyngeal Nasopharyngeal Swab     Status: None   Collection Time: 06/06/20  3:07 PM   Specimen: Nasopharyngeal Swab  Result Value Ref Range Status   SARS Coronavirus 2 NEGATIVE NEGATIVE Final    Comment: (NOTE) SARS-CoV-2 target nucleic acids are NOT DETECTED.  The SARS-CoV-2 RNA is generally detectable in upper and lower respiratory specimens during the acute phase of infection. Negative results do not preclude SARS-CoV-2 infection, do not rule out co-infections with other pathogens, and should not be used as the sole basis for treatment or other patient management decisions. Negative results must be combined with clinical  observations, patient history, and epidemiological information. The expected result is Negative.  Fact Sheet for Patients: HairSlick.no  Fact Sheet for Healthcare Providers: quierodirigir.com  This test is not yet approved or cleared by the [place] FDA and  has been authorized for detection and/or diagnosis of SARS-CoV-2 by FDA under an Emergency Use Authorization (EUA). This EUA will remain  in effect (meaning this test can be used) for the duration of the COVID-19 declaration under Se ction 564(b)(1) of the Act, 21 U.S.C. section 360bbb-3(b)(1), unless the authorization is terminated or revoked sooner.  Performed at [facility], [address], [place], [place] [address]          Radiology Studies: CT HEAD WO CONTRAST  Result Date: 06/12/2020 CLINICAL DATA:  Mental status change, persistent or worsening Recent knee replacement. EXAM: CT HEAD WITHOUT CONTRAST TECHNIQUE: Contiguous axial images were obtained from the base of the skull through the vertex without intravenous  contrast. COMPARISON:  Brain MRI 02/13/2016 FINDINGS: Brain: No intracranial hemorrhage, mass effect, or midline shift. Brain volume is normal for age. No hydrocephalus. The basilar cisterns are patent. Moderate for age chronic small vessel ischemic change. No evidence of territorial infarct or acute ischemia. No extra-axial or intracranial fluid collection. Vascular: No hyperdense vessel. Skull: No fracture or focal lesion.  Scattered arachnoid leaks. Sinuses/Orbits: Paranasal sinuses and mastoid air cells are clear. The visualized orbits are unremarkable. Other: None. IMPRESSION: 1. No acute intracranial abnormality. 2. Moderate for age chronic small vessel ischemic change. Electronically Signed   By: Narda Rutherford M.D.   On: 06/12/2020 20:01   DG CHEST PORT 1 VIEW  Result Date: 06/12/2020 CLINICAL DATA:  Shortness of breast EXAM: PORTABLE CHEST 1 VIEW COMPARISON:  08/23/2019 FINDINGS: The heart size and mediastinal contours are within normal limits. Both lungs are clear. The visualized skeletal structures are unremarkable. IMPRESSION: No active disease. Electronically Signed   By: Deatra Robinson M.D.   On: 06/12/2020 19:27        Scheduled Meds: . acidophilus  1 capsule Oral Daily  . aspirin  81 mg Oral BID  . docusate sodium  100 mg Oral BID  . DULoxetine  60 mg Oral QHS  . levothyroxine  88 mcg Oral Daily  . multivitamin with minerals  1 tablet Oral Daily  . pantoprazole  40 mg Oral Daily  . predniSONE  5 mg Oral Daily  . sulfaSALAzine  1,000 mg Oral BID   Continuous Infusions: . cefTRIAXone (ROCEPHIN)  IV       LOS: 4 days    Time spent: 25 mins.More than 50% of that time was spent in counseling and/or coordination of care.      Burnadette Pop, MD [facility] P1/04/2021, 7:42 AM

## 2020-06-14 ENCOUNTER — Other Ambulatory Visit (HOSPITAL_COMMUNITY): Payer: Self-pay | Admitting: Orthopedic Surgery

## 2020-06-14 DIAGNOSIS — Z96651 Presence of right artificial knee joint: Secondary | ICD-10-CM | POA: Diagnosis not present

## 2020-06-14 LAB — BASIC METABOLIC PANEL
Anion gap: 13 (ref 5–15)
BUN: 14 mg/dL (ref 8–23)
CO2: 27 mmol/L (ref 22–32)
Calcium: 8.7 mg/dL — ABNORMAL LOW (ref 8.9–10.3)
Chloride: 96 mmol/L — ABNORMAL LOW (ref 98–111)
Creatinine, Ser: 0.65 mg/dL (ref 0.44–1.00)
GFR, Estimated: >60 mL/min (ref >60)
Glucose, Bld: 104 mg/dL — ABNORMAL HIGH (ref 70–99)
Potassium: 3.1 mmol/L — ABNORMAL LOW (ref 3.5–5.1)
Sodium: 136 mmol/L (ref 135–145)

## 2020-06-14 LAB — T3, FREE: T3, Free: 1.2 pg/mL — ABNORMAL LOW (ref 2.0–4.4)

## 2020-06-14 MED ORDER — CEFDINIR 300 MG PO CAPS: 300.0000 mg | ORAL_CAPSULE | Freq: Two times a day (BID) | ORAL | Status: DC

## 2020-06-14 MED ORDER — CEFDINIR 300 MG PO CAPS: 300.0000 mg | ORAL_CAPSULE | Freq: Two times a day (BID) | ORAL | 2 refills | Status: DC

## 2020-06-14 MED ORDER — POTASSIUM CHLORIDE CRYS ER 20 MEQ PO TBCR
40.0000 meq | EXTENDED_RELEASE_TABLET | ORAL | Status: AC
  Administered 2020-06-14 (×2): 40 meq via ORAL
  Filled 2020-06-14 (×2): qty 2

## 2020-06-14 MED ORDER — HYDROCODONE-ACETAMINOPHEN 5-325 MG PO TABS: 1.0000 | ORAL_TABLET | ORAL | 0 refills | Status: DC | PRN

## 2020-06-14 MED ORDER — POTASSIUM CHLORIDE CRYS ER 20 MEQ PO TBCR: 40.0000 meq | EXTENDED_RELEASE_TABLET | Freq: Every day | ORAL | 2 refills | Status: DC

## 2020-06-14 MED FILL — POTASSIUM CHLORIDE CRYS ER: 20 | 5 days supply | Qty: 10 | Fill #0

## 2020-06-14 MED FILL — CEFDINIR 300 MG CAPS: 300 | 3 days supply | Qty: 6 | Fill #0

## 2020-06-14 NOTE — Plan of Care (Signed)
  Problem: Education: ?Goal: Required Educational Video(s) ?Outcome: Completed/Met ?  ?Problem: Clinical Measurements: ?Goal: Postoperative complications will be avoided or minimized ?Outcome: Completed/Met ?  ?Problem: Skin Integrity: ?Goal: Demonstration of wound healing without infection will improve ?Outcome: Completed/Met ?  ?

## 2020-06-14 NOTE — Progress Notes (Signed)
Subjective: 5 Days Post-Op Procedure(s) (LRB): TOTAL KNEE ARTHROPLASTY (Left) Patient reports pain as 3 on 0-10 scale.   Denies CP or SOB.  Voiding without difficulty. Positive flatus. Objective: Vital signs in last 24 hours: Temp:  [97.8 F (36.6 C)-98.3 F (36.8 C)] 97.8 F (36.6 C) (01/12 0521) Pulse Rate:  [83-100] 83 (01/12 0521) Resp:  [16-18] 16 (01/12 0521) BP: (108-115)/(56-63) 114/62 (01/12 0521) SpO2:  [94 %-100 %] 94 % (01/12 0521)  Intake/Output from previous day: 01/11 0701 - 01/12 0700 In: 820 [P.O.:720; IV Piggyback:100] Out: -  Intake/Output this shift: No intake/output data recorded.  Recent Labs    06/12/20 0721 06/13/20 0800  HGB 11.5* 10.5*   Recent Labs    06/12/20 0721 06/13/20 0800  WBC 15.2* 10.9*  RBC 3.49* 3.24*  HCT 35.9* 33.3*  PLT 220 239   Recent Labs    06/13/20 0800 06/14/20 0530  NA 137 136  K 2.9* 3.1*  CL 97* 96*  CO2 27 27  BUN 18 14  CREATININE 0.66 0.65  GLUCOSE 88 104*  CALCIUM 8.8* 8.7*   No results for input(s): LABPT, INR in the last 72 hours.  Neurologically intact Sensation intact distally Dorsiflexion/Plantar flexion intact Incision: scant drainage  Assessment/Plan: 5 Days Post-Op Procedure(s) (LRB): TOTAL KNEE ARTHROPLASTY (Left)  Much better Advance diet Up with therapy Discharge home with home health if potassium better and can convert to oral Ab for uti. Also rx for home hydrocodone  Javier Docker 06/14/2020, 7:54 AM

## 2020-06-14 NOTE — Discharge Summary (Signed)
Physician Discharge Summary   Patient ID: Becky Boyd MRN: [id number] DOB/AGE: November 13, 1955 65 y.o.  Admit date: 06/09/2020 Discharge date: 06/14/2020  Primary Diagnosis: Left knee primary osteoarthritis  Admission Diagnoses:  Past Medical History:  Diagnosis Date  . Aneurysm (HCC)   . Anorexia   . Dysphagia   . Fibromyalgia   . Fibromyalgia   . GERD (gastroesophageal reflux disease)   . Hashimoto thyroiditis   . History of kidney stones    hx of multiple kidney stones   . Hypothyroidism   . Kidney stones   . Lupus (HCC)   . Palpitations   . Rheumatoid arthritis (HCC)   . Sjogren's disease [facility])    Discharge Diagnoses:   Active Problems:   S/P TKR (total knee replacement) using cement Hyperkalemia UTI  Estimated body mass index is 27.1 kg/m as calculated from the following:   Height as of this encounter: 5\' 3"  (1.6 m).   Weight as of this encounter: 69.4 kg.  Procedure:  Procedure(s) (LRB): TOTAL KNEE ARTHROPLASTY (Left)   Consults: hospitalists  HPI: see H&P Laboratory Data: Admission on 06/09/2020  Component Date Value Ref Range Status  . WBC 06/10/2020 14.0* 4.0 - 10.5 K/uL Final  . RBC 06/10/2020 3.15* 3.87 - 5.11 MIL/uL Final  . Hemoglobin 06/10/2020 10.5* 12.0 - 15.0 g/dL Final  . HCT 19/14/7829 33.6* 36.0 - 46.0 % Final  . MCV 06/10/2020 106.7* 80.0 - 100.0 fL Final  . MCH 06/10/2020 33.3  26.0 - 34.0 pg Final  . MCHC 06/10/2020 31.3  30.0 - 36.0 g/dL Final  . RDW 56/21/3086 13.5  11.5 - 15.5 % Final  . Platelets 06/10/2020 227  150 - 400 K/uL Final  . nRBC 06/10/2020 0.0  0.0 - 0.2 % Final   Performed at [facility], [address] [place], [place] [address]  . Sodium 06/10/2020 135  135 - 145 mmol/L Final  . Potassium 06/10/2020 3.7  3.5 - 5.1 mmol/L Final  . Chloride 06/10/2020 100  98 - 111 mmol/L Final  . CO2 06/10/2020 25  22 - 32 mmol/L Final  . Glucose, Bld 06/10/2020 153* 70 - 99 mg/dL Final   Glucose reference range  applies only to samples taken after fasting for at least 8 hours.  . BUN 06/10/2020 10  8 - 23 mg/dL Final  . Creatinine, Ser 06/10/2020 0.75  0.44 - 1.00 mg/dL Final  . Calcium 96/29/5284 8.8* 8.9 - 10.3 mg/dL Final  . GFR, Estimated 06/10/2020 >60  >60 mL/min Final   Comment: (NOTE) Calculated using the CKD-EPI Creatinine Equation (2021)   . Anion gap 06/10/2020 10  5 - 15 Final   Performed at [facility], [address], [place], [place] [address]  . WBC 06/11/2020 13.6* 4.0 - 10.5 K/uL Final  . RBC 06/11/2020 3.07* 3.87 - 5.11 MIL/uL Final  . Hemoglobin 06/11/2020 10.2* 12.0 - 15.0 g/dL Final  . HCT 06/05/7251 31.3* 36.0 - 46.0 % Final  . MCV 06/11/2020 102.0* 80.0 - 100.0 fL Final  . MCH 06/11/2020 33.2  26.0 - 34.0 pg Final  . MCHC 06/11/2020 32.6  30.0 - 36.0 g/dL Final  . RDW 66/44/0347 13.1  11.5 - 15.5 % Final  . Platelets 06/11/2020 207  150 - 400 K/uL Final  . nRBC 06/11/2020 0.0  0.0 - 0.2 % Final   Performed at [facility], [address], [place], [place] [address]  . Glucose-Capillary 06/10/2020 82  70 -  99 mg/dL Final   Glucose reference range applies only to samples taken after fasting for at least 8 hours.  . WBC 06/12/2020 15.2* 4.0 - 10.5 K/uL Final  . RBC 06/12/2020 3.49* 3.87 - 5.11 MIL/uL Final  . Hemoglobin 06/12/2020 11.5* 12.0 - 15.0 g/dL Final  . HCT 53/66/4403 35.9* 36.0 - 46.0 % Final  . MCV 06/12/2020 102.9* 80.0 - 100.0 fL Final  . MCH 06/12/2020 33.0  26.0 - 34.0 pg Final  . MCHC 06/12/2020 32.0  30.0 - 36.0 g/dL Final  . RDW 47/42/5956 13.5  11.5 - 15.5 % Final  . Platelets 06/12/2020 220  150 - 400 K/uL Final  . nRBC 06/12/2020 0.0  0.0 - 0.2 % Final   Performed at [facility], [address], [place], [place] [address]  . Ammonia 06/12/2020 27  9 - 35 umol/L Final   Performed at [facility], [address], [place], [place] [address]  . Delivery systems 06/12/2020  NASAL CANNULA   Final  . pH, Arterial 06/12/2020 7.495* 7.350 - 7.450 Final  . pCO2 arterial 06/12/2020 35.5  32.0 - 48.0 mmHg Final  . pO2, Arterial 06/12/2020 90.9  83.0 - 108.0 mmHg Final  . Bicarbonate 06/12/2020 27.2  20.0 - 28.0 mmol/L Final  . Acid-Base Excess 06/12/2020 4.2* 0.0 - 2.0 mmol/L Final  . O2 Saturation 06/12/2020 96.4  % Final  . Patient temperature 06/12/2020 98.6   Final  . Sample type 06/12/2020 LEFT RADIAL   Final  . Allens test (pass/fail) 06/12/2020 PASS  PASS Final   Performed at [facility], [address] [place], [place] [address]  . Sodium 06/12/2020 136  135 - 145 mmol/L Final  . Potassium 06/12/2020 3.2* 3.5 - 5.1 mmol/L Final  . Chloride 06/12/2020 96* 98 - 111 mmol/L Final  . CO2 06/12/2020 26  22 - 32 mmol/L Final  . Glucose, Bld 06/12/2020 155* 70 - 99 mg/dL Final   Glucose reference range applies only to samples taken after fasting for at least 8 hours.  . BUN 06/12/2020 15  8 - 23 mg/dL Final  . Creatinine, Ser 06/12/2020 0.77  0.44 - 1.00 mg/dL Final  . Calcium 16/60/6301 9.0  8.9 - 10.3 mg/dL Final  . Total Protein 06/12/2020 5.7* 6.5 - 8.1 g/dL Final  . Albumin 60/03/9322 3.3* 3.5 - 5.0 g/dL Final  . AST 55/73/2202 29  15 - 41 U/L Final  . ALT 06/12/2020 24  0 - 44 U/L Final  . Alkaline Phosphatase 06/12/2020 100  38 - 126 U/L Final  . Total Bilirubin 06/12/2020 0.7  0.3 - 1.2 mg/dL Final  . GFR, Estimated 06/12/2020 >60  >60 mL/min Final   Comment: (NOTE) Calculated using the CKD-EPI Creatinine Equation (2021)   . Anion gap 06/12/2020 14  5 - 15 Final   Performed at [facility], [address] [place], [place] [address]  . Color, Urine 06/12/2020 AMBER* YELLOW Final   BIOCHEMICALS MAY BE AFFECTED BY COLOR  . APPearance 06/12/2020 HAZY* CLEAR Final  . Specific Gravity, Urine 06/12/2020 1.015  1.005 - 1.030 Final  . pH 06/12/2020 5.0  5.0 - 8.0 Final  . Glucose, UA 06/12/2020 NEGATIVE  NEGATIVE  mg/dL Final  . Hgb urine dipstick 06/12/2020 SMALL* NEGATIVE Final  . Bilirubin Urine 06/12/2020 NEGATIVE  NEGATIVE Final  . Ketones, ur 06/12/2020 20* NEGATIVE mg/dL Final  . Protein, ur 62/37/6283 NEGATIVE  NEGATIVE mg/dL Final  .  Nitrite 06/12/2020 NEGATIVE  NEGATIVE Final  . Leukocytes,Ua 06/12/2020 MODERATE* NEGATIVE Final  . RBC / HPF 06/12/2020 11-20  0 - 5 RBC/hpf Final  . WBC, UA 06/12/2020 >50* 0 - 5 WBC/hpf Final  . Bacteria, UA 06/12/2020 NONE SEEN  NONE SEEN Final  . Mucus 06/12/2020 PRESENT   Final   Performed at [facility], [address] [place], [place] [address]  . TSH 06/12/2020 1.717  0.350 - 4.500 uIU/mL Final   Comment: Performed by a 3rd Generation assay with a functional sensitivity of <=0.01 uIU/mL. Performed at [facility], [address], [place], [place] [address]   . Free T4 06/12/2020 1.03  0.61 - 1.12 ng/dL Final   Comment: (NOTE) Biotin ingestion may interfere with free T4 tests. If the results are inconsistent with the TSH level, previous test results, or the clinical presentation, then consider biotin interference. If needed, order repeat testing after stopping biotin. Performed at [facility], [address], [place], [place] [address]   . T3, Free 06/12/2020 1.2* 2.0 - 4.4 pg/mL Final   Comment: (NOTE) Performed At: [facility] [address] [place], [place] [id number] Jolene Schimke MD OZ:[id number]   . WBC 06/13/2020 10.9* 4.0 - 10.5 K/uL Final  . RBC 06/13/2020 3.24* 3.87 - 5.11 MIL/uL Final  . Hemoglobin 06/13/2020 10.5* 12.0 - 15.0 g/dL Final  . HCT 62/95/2841 33.3* 36.0 - 46.0 % Final  . MCV 06/13/2020 102.8* 80.0 - 100.0 fL Final  . MCH 06/13/2020 32.4  26.0 - 34.0 pg Final  . MCHC 06/13/2020 31.5  30.0 - 36.0 g/dL Final  . RDW 32/44/0102 13.6  11.5 - 15.5 % Final  . Platelets 06/13/2020 239  150 - 400 K/uL Final  . nRBC 06/13/2020 0.0  0.0 - 0.2 % Final  . Neutrophils  Relative % 06/13/2020 77  % Final  . Neutro Abs 06/13/2020 8.5* 1.7 - 7.7 K/uL Final  . Lymphocytes Relative 06/13/2020 9  % Final  . Lymphs Abs 06/13/2020 1.0  0.7 - 4.0 K/uL Final  . Monocytes Relative 06/13/2020 12  % Final  . Monocytes Absolute 06/13/2020 1.3* 0.1 - 1.0 K/uL Final  . Eosinophils Relative 06/13/2020 1  % Final  . Eosinophils Absolute 06/13/2020 0.1  0.0 - 0.5 K/uL Final  . Basophils Relative 06/13/2020 1  % Final  . Basophils Absolute 06/13/2020 0.1  0.0 - 0.1 K/uL Final  . Immature Granulocytes 06/13/2020 0  % Final  . Abs Immature Granulocytes 06/13/2020 0.03  0.00 - 0.07 K/uL Final   Performed at [facility], [address], [place], [place] [address]  . Sodium 06/13/2020 137  135 - 145 mmol/L Final  . Potassium 06/13/2020 2.9* 3.5 - 5.1 mmol/L Final  . Chloride 06/13/2020 97* 98 - 111 mmol/L Final  . CO2 06/13/2020 27  22 - 32 mmol/L Final  . Glucose, Bld 06/13/2020 88  70 - 99 mg/dL Final   Glucose reference range applies only to samples taken after fasting for at least 8 hours.  . BUN 06/13/2020 18  8 - 23 mg/dL Final  . Creatinine, Ser 06/13/2020 0.66  0.44 - 1.00 mg/dL Final  . Calcium 64/40/3474 8.8* 8.9 - 10.3 mg/dL Final  . GFR, Estimated 06/13/2020 >60  >60 mL/min Final   Comment: (NOTE) Calculated using the CKD-EPI Creatinine Equation (2021)   . Anion gap 06/13/2020 13  5 - 15 Final   Performed at [facility], [address] [place], [place] [address]  . Magnesium 06/13/2020 2.2  1.7 - 2.4 mg/dL Final   Performed at [facility], [address], [place], [place] [address]  . Sodium 06/14/2020 136  135 - 145 mmol/L Final  . Potassium 06/14/2020 3.1* 3.5 - 5.1 mmol/L Final  . Chloride 06/14/2020 96* 98 - 111 mmol/L Final  . CO2 06/14/2020 27  22 - 32 mmol/L Final  . Glucose, Bld 06/14/2020 104* 70 - 99 mg/dL Final   Glucose reference range applies only to samples taken after fasting for at  least 8 hours.  . BUN 06/14/2020 14  8 - 23 mg/dL Final  . Creatinine, Ser 06/14/2020 0.65  0.44 - 1.00 mg/dL Final  . Calcium 09/81/1914 8.7* 8.9 - 10.3 mg/dL Final  . GFR, Estimated 06/14/2020 >60  >60 mL/min Final   Comment: (NOTE) Calculated using the CKD-EPI Creatinine Equation (2021)   . Anion gap 06/14/2020 13  5 - 15 Final   Performed at [facility], [address], [place], [place] [address]  Hospital Outpatient Visit on 06/06/2020  Component Date Value Ref Range Status  . SARS Coronavirus 2 06/06/2020 NEGATIVE  NEGATIVE Final   Comment: (NOTE) SARS-CoV-2 target nucleic acids are NOT DETECTED.  The SARS-CoV-2 RNA is generally detectable in upper and lower respiratory specimens during the acute phase of infection. Negative results do not preclude SARS-CoV-2 infection, do not rule out co-infections with other pathogens, and should not be used as the sole basis for treatment or other patient management decisions. Negative results must be combined with clinical observations, patient history, and epidemiological information. The expected result is Negative.  Fact Sheet for Patients: HairSlick.no  Fact Sheet for Healthcare Providers: quierodirigir.com  This test is not yet approved or cleared by the [place] FDA and  has been authorized for detection and/or diagnosis of SARS-CoV-2 by FDA under an Emergency Use Authorization (EUA). This EUA will remain  in effect (meaning this test can be used) for the duration of the COVID-19 declaration under Se                          ction 564(b)(1) of the Act, 21 U.S.C. section 360bbb-3(b)(1), unless the authorization is terminated or revoked sooner.  Performed at [facility], [address] [place], [place] [address]   Hospital Outpatient Visit on 06/06/2020  Component Date Value Ref Range Status  . WBC 06/06/2020 7.5  4.0 - 10.5 K/uL Final  .  RBC 06/06/2020 3.59* 3.87 - 5.11 MIL/uL Final  . Hemoglobin 06/06/2020 11.6* 12.0 - 15.0 g/dL Final  . HCT 86/57/8469 36.9  36.0 - 46.0 % Final  . MCV 06/06/2020 102.8* 80.0 - 100.0 fL Final  . MCH 06/06/2020 32.3  26.0 - 34.0 pg Final  . MCHC 06/06/2020 31.4  30.0 - 36.0 g/dL Final  . RDW 62/95/2841 13.5  11.5 - 15.5 % Final  . Platelets 06/06/2020 241  150 - 400 K/uL Final  . nRBC 06/06/2020 0.0  0.0 - 0.2 % Final   Performed at [facility], [address], [place], [place] [address]  . Sodium 06/06/2020 141  135 - 145 mmol/L Final  . Potassium 06/06/2020 4.0  3.5 - 5.1 mmol/L Final  . Chloride 06/06/2020 105  98 - 111 mmol/L Final  . CO2 06/06/2020 26  22 - 32 mmol/L Final  . Glucose, Bld 06/06/2020 113* 70 -  99 mg/dL Final   Glucose reference range applies only to samples taken after fasting for at least 8 hours.  . BUN 06/06/2020 20  8 - 23 mg/dL Final  . Creatinine, Ser 06/06/2020 0.81  0.44 - 1.00 mg/dL Final  . Calcium 40/98/1191 9.6  8.9 - 10.3 mg/dL Final  . GFR, Estimated 06/06/2020 >60  >60 mL/min Final   Comment: (NOTE) Calculated using the CKD-EPI Creatinine Equation (2021)   . Anion gap 06/06/2020 10  5 - 15 Final   Performed at [facility], [address], [place], [place] [address]  . Prothrombin Time 06/06/2020 12.6  11.4 - 15.2 seconds Final  . INR 06/06/2020 1.0  0.8 - 1.2 Final   Comment: (NOTE) INR goal varies based on device and disease states. Performed at [facility], [address] [place], [place] [address]   . aPTT 06/06/2020 29  24 - 36 seconds Final   Performed at [facility], [address], [place], [place] [address]  . Color, Urine 06/06/2020 AMBER* YELLOW Final   BIOCHEMICALS MAY BE AFFECTED BY COLOR  . APPearance 06/06/2020 CLEAR  CLEAR Final  . Specific Gravity, Urine 06/06/2020 1.027  1.005 - 1.030 Final  . pH 06/06/2020 5.0  5.0 - 8.0 Final  . Glucose, UA  06/06/2020 NEGATIVE  NEGATIVE mg/dL Final  . Hgb urine dipstick 06/06/2020 NEGATIVE  NEGATIVE Final  . Bilirubin Urine 06/06/2020 NEGATIVE  NEGATIVE Final  . Ketones, ur 06/06/2020 NEGATIVE  NEGATIVE mg/dL Final  . Protein, ur 84/69/6295 NEGATIVE  NEGATIVE mg/dL Final  . Nitrite 28/41/3244 NEGATIVE  NEGATIVE Final  . Glori Luis 06/06/2020 NEGATIVE  NEGATIVE Final   Performed at [facility], [address] [place], [place] [address]  . MRSA, PCR 06/06/2020 NEGATIVE  NEGATIVE Final  . Staphylococcus aureus 06/06/2020 NEGATIVE  NEGATIVE Final   Comment: (NOTE) The Xpert SA Assay (FDA approved for NASAL specimens in patients 62 years of age and older), is one component of a comprehensive surveillance program. It is not intended to diagnose infection nor to guide or monitor treatment. Performed at [facility], [address] [place], [place] [address]      X-Rays:DG Knee 2 Views Left  Result Date: 06/09/2020 CLINICAL DATA:  Total knee replacement EXAM: LEFT KNEE - 1-2 VIEW COMPARISON:  None. FINDINGS: The patient is status post left total knee replacement. Femoral and tibial components are in good position. Skin staples are noted. Postoperative air seen in the soft tissues. IMPRESSION: Status post left total knee replacement as above. Electronically Signed   By: Gerome Sam III M.D   On: 06/09/2020 19:28   CT HEAD WO CONTRAST  Result Date: 06/12/2020 CLINICAL DATA:  Mental status change, persistent or worsening Recent knee replacement. EXAM: CT HEAD WITHOUT CONTRAST TECHNIQUE: Contiguous axial images were obtained from the base of the skull through the vertex without intravenous contrast. COMPARISON:  Brain MRI 02/13/2016 FINDINGS: Brain: No intracranial hemorrhage, mass effect, or midline shift. Brain volume is normal for age. No hydrocephalus. The basilar cisterns are patent. Moderate for age chronic small vessel ischemic change. No evidence of  territorial infarct or acute ischemia. No extra-axial or intracranial fluid collection. Vascular: No hyperdense vessel. Skull: No fracture or focal lesion.  Scattered arachnoid leaks. Sinuses/Orbits: Paranasal sinuses and mastoid air cells are clear. The visualized orbits are unremarkable. Other: None. IMPRESSION: 1. No acute intracranial abnormality. 2. Moderate for age chronic small vessel ischemic change.  Electronically Signed   By: Narda Rutherford M.D.   On: 06/12/2020 20:01   DG CHEST PORT 1 VIEW  Result Date: 06/12/2020 CLINICAL DATA:  Shortness of breast EXAM: PORTABLE CHEST 1 VIEW COMPARISON:  08/23/2019 FINDINGS: The heart size and mediastinal contours are within normal limits. Both lungs are clear. The visualized skeletal structures are unremarkable. IMPRESSION: No active disease. Electronically Signed   By: Deatra Robinson M.D.   On: 06/12/2020 19:27    EKG: Orders placed or performed in visit on 02/17/20  . EKG 12-Lead     Hospital Course: Becky Boyd is a 65 y.o. who was admitted to [facility]. They were brought to the operating room on 06/09/2020 and underwent Procedure(s): TOTAL KNEE ARTHROPLASTY.  Patient tolerated the procedure well and was later transferred to the recovery room and then to the orthopaedic floor for postoperative care.  They were given PO and IV analgesics for pain control following their surgery.  They were given 24 hours of postoperative antibiotics of  Anti-infectives (From admission, onward)   Start     Dose/Rate Route Frequency Ordered Stop   06/15/20 1000  cefdinir (OMNICEF) capsule 300 mg        300 mg Oral Every 12 hours 06/14/20 0934 06/18/20 0959   06/15/20 0000  cefdinir (OMNICEF) 300 MG capsule        300 mg Oral Every 12 hours 06/14/20 1221     06/13/20 0800  cefTRIAXone (ROCEPHIN) 1 g in sodium chloride 0.9 % 100 mL IVPB  Status:  Discontinued        1 g 200 mL/hr over 30 Minutes Intravenous Every 24 hours 06/13/20 0730 06/14/20 0934    06/09/20 2115  ceFAZolin (ANCEF) IVPB 1 g/50 mL premix        1 g 100 mL/hr over 30 Minutes Intravenous Every 6 hours 06/09/20 2018 06/10/20 0917   06/09/20 1053  ceFAZolin (ANCEF) 2-4 GM/100ML-% IVPB       Note to Pharmacy: Montel Clock   : cabinet override      06/09/20 1053 06/09/20 1337   06/09/20 1030  ceFAZolin (ANCEF) IVPB 2g/100 mL premix        2 g 200 mL/hr over 30 Minutes Intravenous On call to O.R. 06/09/20 1017 06/09/20 1339     and started on DVT prophylaxis in the form of Aspirin, TED hose and SCDs.   PT and OT were ordered for total joint protocol.  Discharge planning consulted to help with postop disposition and equipment needs.  Patient had a fair night on the evening of surgery.  They started to get up OOB with therapy on day one.  Continued to work with therapy into day two. She became very somnolent through the weekend due to Dilaudid and Oxycodone. By day 3 post-op, with holding dilaudid, mentation improved some. Hospitalists were also asked to evaluate her, she was found to be hypokalemic and have a UTI. Her symptoms did improve with repletion of potassium and initiation of Omnicef for her UTI.  By day five, the patient had progressed with therapy and meeting their goals.  Incision was healing well.  Patient was seen in rounds daily and was ready to go home.   Diet: Regular diet Activity:WBAT Follow-up:in 10-14 days Disposition - Home Discharged Condition: good   Discharge Instructions    Call MD / Call 911   Complete by: As directed    If you experience chest pain or shortness of breath, CALL 911 and  be transported to the hospital emergency room.  If you develope a fever above 101 F, pus (white drainage) or increased drainage or redness at the wound, or calf pain, call your surgeon's office.   Constipation Prevention   Complete by: As directed    Drink plenty of fluids.  Prune juice may be helpful.  You may use a stool softener, such as Colace (over the counter)  100 mg twice a day.  Use MiraLax (over the counter) for constipation as needed.   Diet - low sodium heart healthy   Complete by: As directed    Increase activity slowly as tolerated   Complete by: As directed      Allergies as of 06/14/2020      Reactions   Plaquenil [hydroxychloroquine Sulfate] Other (See Comments)   Prolonged QT interval, skin feels like bee stings      Medication List    STOP taking these medications   acetaminophen 500 MG tablet Commonly known as: TYLENOL   atorvastatin 20 MG tablet Commonly known as: LIPITOR   ibuprofen 200 MG tablet Commonly known as: ADVIL   meloxicam 15 MG tablet Commonly known as: MOBIC     TAKE these medications   aspirin EC 81 MG tablet Take 1 tablet (81 mg total) by mouth daily.   baclofen 10 MG tablet Commonly known as: LIORESAL TAKE ONE TABLET BY MOUTH THREE TIMES DAILY   cefdinir 300 MG capsule Commonly known as: OMNICEF Take 1 capsule (300 mg total) by mouth every 12 (twelve) hours. Start taking on: June 15, 2020   docusate sodium 100 MG capsule Commonly known as: Colace Take 1 capsule (100 mg total) by mouth 2 (two) times daily as needed for mild constipation.   DULoxetine 60 MG capsule Commonly known as: CYMBALTA Take 60 mg by mouth at bedtime.   HYDROcodone-acetaminophen 5-325 MG tablet Commonly known as: NORCO/VICODIN Take 1-2 tablets by mouth every 4 (four) hours as needed for moderate pain. What changed:   when to take this  reasons to take this   levothyroxine 88 MCG tablet Commonly known as: SYNTHROID Take 1 tablet (88 mcg total) by mouth daily.   LUBRICATING EYE DROPS OP Place 1 drop into both eyes daily as needed (dry eyes).   multivitamin with minerals Tabs tablet Take 1 tablet by mouth daily.   pantoprazole 40 MG tablet Commonly known as: Protonix Take 1 tablet (40 mg total) by mouth daily.   polyethylene glycol 17 g packet Commonly known as: MIRALAX / GLYCOLAX Take 17 g by mouth  daily.   potassium chloride SA 20 MEQ tablet Commonly known as: KLOR-CON Take 2 tablets (40 mEq total) by mouth daily.   predniSONE 5 MG tablet Commonly known as: DELTASONE Take 5 mg by mouth daily. What changed: Another medication with the same name was removed. Continue taking this medication, and follow the directions you see here.   sulfaSALAzine 500 MG tablet Commonly known as: AZULFIDINE Take 1,000 mg by mouth 2 (two) times daily.       Follow-up Information    Zorita Pang.. Go on 06/13/2020.   Why: You are scheduled for a physical therapy appointment on 06-13-20 at 8:00 am.  Contact information: [address] [place] [address] [phone]        Jene Every, MD. Go on 06/21/2020.   Specialty: Orthopedic Surgery Why: You are scheduled for a post-operative appointment on 06-21-20 at 2:45 pm.  Contact information: [address] [place] [address] [phone]               Signed: Andrez Grime, PA-C Orthopaedic Surgery 06/14/2020, 1:38 PM

## 2020-06-14 NOTE — Progress Notes (Signed)
PROGRESS NOTE    Becky Boyd  ZJQ:[id number] DOB: 04-09-1956 DOA: 06/09/2020 PCP: Sheliah Hatch, MD   Chief Complain: Consulted for somnolence after knee surgery  Brief Narrative: Patient is a 66 year old female with history of SLE, Sjogren's syndrome, hypothyroidism who was admitted for left knee replacement.  She underwent total knee arthroplasty on the left.  Postoperatively, she was found to be somnolent.  TRH was consulted for this.  Thought to be secondary to pain medication/opioids.  Mental status has returned to baseline now.  Assessment & Plan:   Active Problems:   S/P TKR (total knee replacement) using cement   Altered mental status/somnolence: ABG did not show CO2 retention.  Ammonia is normal.  She was on opioids for pain management, on baclofen, on Reglan.  Try to minimize opioids.  CT head did not show any acute intracranial abnormality.  TSH is normal.  Mental status has returned to baseline after cutting down on opiates.  Currently alert and oriented  UTI:  urinalysis is suggestive of UTI with significant leukocytes.Has mild leukocytosis.  Afebrile.  She denies any dysuria, started on ceftriaxone ,pending urine culture.  We can stop the IV antibiotic preparing for discharge, continue Omnicef for 3 more days  Severe hypokalemia: Supplemented with potassium.  On discharge, continue K-  Dur 40 meq daily for  5 days starting from tomorrow.  Check BMP in a week  Hypothyroidism: Continue Synthyroid  Status post left knee replacement: Underwent total knee replacement.  Management as per orthopedics.  History of SLE/Sjogren: Follow-up with rheumatology as an outpatient.  On prednisone, sulfasalazine .  Patient can be discharged from medical perspective.  TRH will sign off         DVT prophylaxis:Aspirin Procedures: Total knee replacement  Antimicrobials:  Anti-infectives (From admission, onward)   Start     Dose/Rate Route Frequency Ordered Stop   06/13/20 0800   cefTRIAXone (ROCEPHIN) 1 g in sodium chloride 0.9 % 100 mL IVPB        1 g 200 mL/hr over 30 Minutes Intravenous Every 24 hours 06/13/20 0730     06/09/20 2115  ceFAZolin (ANCEF) IVPB 1 g/50 mL premix        1 g 100 mL/hr over 30 Minutes Intravenous Every 6 hours 06/09/20 2018 06/10/20 0917   06/09/20 1053  ceFAZolin (ANCEF) 2-4 GM/100ML-% IVPB       Note to Pharmacy: Montel Clock   : cabinet override      06/09/20 1053 06/09/20 1337   06/09/20 1030  ceFAZolin (ANCEF) IVPB 2g/100 mL premix        2 g 200 mL/hr over 30 Minutes Intravenous On call to O.R. 06/09/20 1017 06/09/20 1339      Subjective: Patient seen and examined at the bedside this morning.  Medically stable.  Alert and oriented.  Denies any complaints  Objective: Vitals:   06/13/20 0500 06/13/20 1450 06/13/20 2153 06/14/20 0521  BP: 131/86 (!) 108/56 115/63 114/62  Pulse: 100 100 86 83  Resp: 18 18 18 16   Temp: 97.6 F (36.4 C) 98.3 F (36.8 C) 98.3 F (36.8 C) 97.8 F (36.6 C)  TempSrc: Oral Oral Oral Oral  SpO2: 96% 100% 94% 94%  Weight:      Height:        Intake/Output Summary (Last 24 hours) at 06/14/2020 0720 Last data filed at 06/14/2020 0500 Gross per 24 hour  Intake 820 ml  Output -  Net 820 ml   08/12/2020  06/09/20 1105  Weight: 69.4 kg    Examination:  General exam: Appears calm and comfortable ,Not in distress,average built HEENT:PERRL,Oral mucosa moist, Ear/Nose normal on gross exam Respiratory system: Bilateral equal air entry, normal vesicular breath sounds, no wheezes or crackles  Cardiovascular system: S1 & S2 heard, RRR. No JVD, murmurs, rubs, gallops or clicks. Gastrointestinal system: Abdomen is nondistended, soft and nontender. No organomegaly or masses felt. Normal bowel sounds heard. Central nervous system: Alert and oriented. No focal neurological deficits. Extremities: No edema, no clubbing ,no cyanosis, dressing on right knee Skin: No rashes, lesions or ulcers,no  icterus ,no pallor    Data Reviewed: I have personally reviewed following labs and imaging studies  CBC: Recent Labs  Lab 06/10/20 0553 06/11/20 0544 06/12/20 0721 06/13/20 0800  WBC 14.0* 13.6* 15.2* 10.9*  NEUTROABS  --   --   --  8.5*  HGB 10.5* 10.2* 11.5* 10.5*  HCT 33.6* 31.3* 35.9* 33.3*  MCV 106.7* 102.0* 102.9* 102.8*  PLT 227 207 220 239   Basic Metabolic Panel: Recent Labs  Lab 06/10/20 0553 06/12/20 1233 06/13/20 0800 06/14/20 0530  NA 135 136 137 136  K 3.7 3.2* 2.9* 3.1*  CL 100 96* 97* 96*  CO2 25 26 27 27   GLUCOSE 153* 155* 88 104*  BUN 10 15 18 14   CREATININE 0.75 0.77 0.66 0.65  CALCIUM 8.8* 9.0 8.8* 8.7*  MG  --   --  2.2  --    GFR: Estimated Creatinine Clearance: 64.6 mL/min (by C-G formula based on SCr of 0.65 mg/dL). Liver Function Tests: Recent Labs  Lab 06/12/20 1233  AST 29  ALT 24  ALKPHOS 100  BILITOT 0.7  PROT 5.7*  ALBUMIN 3.3*   No results for input(s): LIPASE, AMYLASE in the last 168 hours. Recent Labs  Lab 06/12/20 1233  AMMONIA 27   Coagulation Profile: No results for input(s): INR, PROTIME in the last 168 hours. Cardiac Enzymes: No results for input(s): CKTOTAL, CKMB, CKMBINDEX, TROPONINI in the last 168 hours. BNP (last 3 results) No results for input(s): PROBNP in the last 8760 hours. HbA1C: No results for input(s): HGBA1C in the last 72 hours. CBG: Recent Labs  Lab 06/10/20 2057  GLUCAP 82   Lipid Profile: No results for input(s): CHOL, HDL, LDLCALC, TRIG, CHOLHDL, LDLDIRECT in the last 72 hours. Thyroid Function Tests: Recent Labs    06/12/20 1809  TSH 1.717  FREET4 1.03  T3FREE 1.2*   Anemia Panel: No results for input(s): VITAMINB12, FOLATE, FERRITIN, TIBC, IRON, RETICCTPCT in the last 72 hours. Sepsis Labs: No results for input(s): PROCALCITON, LATICACIDVEN in the last 168 hours.  Recent Results (from the past 240 hour(s))  Surgical pcr screen     Status: None   Collection Time: 06/06/20   1:34 PM   Specimen: Nasal Mucosa; Nasal Swab  Result Value Ref Range Status   MRSA, PCR NEGATIVE NEGATIVE Final   Staphylococcus aureus NEGATIVE NEGATIVE Final    Comment: (NOTE) The Xpert SA Assay (FDA approved for NASAL specimens in patients 4 years of age and older), is one component of a comprehensive surveillance program. It is not intended to diagnose infection nor to guide or monitor treatment. Performed at [facility], [address], [place], [place]   SARS CORONAVIRUS 2 (TAT 6-24 HRS) Nasopharyngeal Nasopharyngeal Swab     Status: None   Collection Time: 06/06/20  3:07 PM   Specimen: Nasopharyngeal Swab  Result Value Ref Range Status  SARS Coronavirus 2 NEGATIVE NEGATIVE Final    Comment: (NOTE) SARS-CoV-2 target nucleic acids are NOT DETECTED.  The SARS-CoV-2 RNA is generally detectable in upper and lower respiratory specimens during the acute phase of infection. Negative results do not preclude SARS-CoV-2 infection, do not rule out co-infections with other pathogens, and should not be used as the sole basis for treatment or other patient management decisions. Negative results must be combined with clinical observations, patient history, and epidemiological information. The expected result is Negative.  Fact Sheet for Patients: HairSlick.no  Fact Sheet for Healthcare Providers: quierodirigir.com  This test is not yet approved or cleared by the [place] FDA and  has been authorized for detection and/or diagnosis of SARS-CoV-2 by FDA under an Emergency Use Authorization (EUA). This EUA will remain  in effect (meaning this test can be used) for the duration of the COVID-19 declaration under Se ction 564(b)(1) of the Act, 21 U.S.C. section 360bbb-3(b)(1), unless the authorization is terminated or revoked sooner.  Performed at [facility], [address],  [place], [place] [address]          Radiology Studies: CT HEAD WO CONTRAST  Result Date: 06/12/2020 CLINICAL DATA:  Mental status change, persistent or worsening Recent knee replacement. EXAM: CT HEAD WITHOUT CONTRAST TECHNIQUE: Contiguous axial images were obtained from the base of the skull through the vertex without intravenous contrast. COMPARISON:  Brain MRI 02/13/2016 FINDINGS: Brain: No intracranial hemorrhage, mass effect, or midline shift. Brain volume is normal for age. No hydrocephalus. The basilar cisterns are patent. Moderate for age chronic small vessel ischemic change. No evidence of territorial infarct or acute ischemia. No extra-axial or intracranial fluid collection. Vascular: No hyperdense vessel. Skull: No fracture or focal lesion.  Scattered arachnoid leaks. Sinuses/Orbits: Paranasal sinuses and mastoid air cells are clear. The visualized orbits are unremarkable. Other: None. IMPRESSION: 1. No acute intracranial abnormality. 2. Moderate for age chronic small vessel ischemic change. Electronically Signed   By: Narda Rutherford M.D.   On: 06/12/2020 20:01   DG CHEST PORT 1 VIEW  Result Date: 06/12/2020 CLINICAL DATA:  Shortness of breast EXAM: PORTABLE CHEST 1 VIEW COMPARISON:  08/23/2019 FINDINGS: The heart size and mediastinal contours are within normal limits. Both lungs are clear. The visualized skeletal structures are unremarkable. IMPRESSION: No active disease. Electronically Signed   By: Deatra Robinson M.D.   On: 06/12/2020 19:27        Scheduled Meds: . acidophilus  1 capsule Oral Daily  . aspirin  81 mg Oral BID  . docusate sodium  100 mg Oral BID  . DULoxetine  60 mg Oral QHS  . levothyroxine  88 mcg Oral Daily  . multivitamin with minerals  1 tablet Oral Daily  . pantoprazole  40 mg Oral Daily  . potassium chloride  40 mEq Oral Q4H  . predniSONE  5 mg Oral Daily  . sulfaSALAzine  1,000 mg Oral BID   Continuous Infusions: . cefTRIAXone (ROCEPHIN)  IV Stopped  (06/13/20 1243)     LOS: 5 days    Time spent: 25 mins.More than 50% of that time was spent in counseling and/or coordination of care.      Burnadette Pop, MD [facility] P1/05/2021, 7:20 AM

## 2020-06-14 NOTE — Progress Notes (Signed)
Physical Therapy Treatment Patient Details Name: Becky Boyd MRN: [id number] DOB: 1956-05-01 Today's Date: 06/14/2020    History of Present Illness Pt s/p L TKR and with hx of RA, Sjogrens, and multiple spinal surgeries including cervical fusion    PT Comments    POD # 5 am session This time Son was present during session and educated on all aspects of mobility with safety and HEP.   Assisted pt OOB.  General bed mobility comments: instructed sone on proper handling and assist needed L LE  General transfer comment: had son "hands on" asssist under Therapist direction with 25% VC's on proper hand placement and safety with turns assisting from EOB to amb.  Instructed on safe handling and use of gait belt.  General Gait Details: imrovement with mobility having Son "hands on" assist under Therapist direction pt was able to advance gait to 35 feet with 25% VC's on proper sequencing and proper walker to self distance.Then returned to room to perform some TE's following HEP handout.  Instructed on proper tech, freq as well as use of ICE.   Addressed all mobility questions, discussed appropriate activity, educated on use of ICE.  Pt ready for D/C to home.   Follow Up Recommendations  Home health PT per evaluating LPT Per chart review pt is set for OP     Equipment Recommendations  None recommended by PT    Recommendations for Other Services       Precautions / Restrictions Precautions Precautions: Fall;Knee Precaution Comments: instructed on proper positioning full extension at rest Restrictions Weight Bearing Restrictions: No Other Position/Activity Restrictions: WBAT    Mobility  Bed Mobility Overal bed mobility: Needs Assistance Bed Mobility: Supine to Sit     Supine to sit: Supervision;Min guard     General bed mobility comments: instructed sone on proper handling and assist needed L LE  Transfers Overall transfer level: Needs assistance Equipment used: Rolling walker (2  wheeled) Transfers: Sit to/from [facility] Sit to Stand: Min assist Stand pivot transfers: Min assist       General transfer comment: had son "hands on" asssist under Therapist direction with 25% VC's on proper hand placement and safety with turns assisting from EOB to amb.  Instructed on safe handling and use of gait belt.  Ambulation/Gait Ambulation/Gait assistance: Supervision;Min guard Gait Distance (Feet): 35 Feet Assistive device: Rolling walker (2 wheeled) Gait Pattern/deviations: Step-to pattern;Decreased step length - right;Shuffle;Trunk flexed;Decreased step length - left;Decreased stance time - left     General Gait Details: imrovement with mobility having Son "hands on" assist under Therapist direction pt was able to advance gait to 35 feet with 25% VC's on proper sequencing and proper walker to self distance.   Stairs Stairs:  (no stairs to enter home)           Wheelchair Mobility    Modified Rankin (Stroke Patients Only)       Balance                                            Cognition Arousal/Alertness: Awake/alert Behavior During Therapy: WFL for tasks assessed/performed Overall Cognitive Status: Within Functional Limits for tasks assessed                                 General Comments: [facility]  improved AxO x 3 and fully awake following all directions      Exercises   Total Knee Replacement TE's following HEP handout 10 reps B LE ankle pumps 05 reps towel squeezes 05 reps knee presses 05 reps heel slides  05 reps SAQ's 05 reps SLR's 05 reps ABD Educated on use of gait belt to assist with TE's Followed by ICE     General Comments        Pertinent Vitals/Pain Pain Assessment: 0-10 Pain Score: 7  Pain Location: L knee/thigh Pain Descriptors / Indicators: Aching;Grimacing;Sore;Tightness Pain Intervention(s): Monitored during session;Premedicated before session;Repositioned;Ice applied     Home Living                      Prior Function            PT Goals (current goals can now be found in the care plan section) Progress towards PT goals: Progressing toward goals    Frequency    7X/week      PT Plan Current plan remains appropriate    Co-evaluation              AM-PAC PT "6 Clicks" Mobility   Outcome Measure  Help needed turning from your back to your side while in a flat bed without using bedrails?: None Help needed moving from lying on your back to sitting on the side of a flat bed without using bedrails?: None Help needed moving to and from a bed to a chair (including a wheelchair)?: A Little Help needed standing up from a chair using your arms (e.g., wheelchair or bedside chair)?: A Little Help needed to walk in hospital room?: A Little Help needed climbing 3-5 steps with a railing? : A Little 6 Click Score: 20    End of Session Equipment Utilized During Treatment: Gait belt Activity Tolerance: Patient tolerated treatment well Patient left: in chair;with call bell/phone within reach;with family/visitor present Nurse Communication: Mobility status (pt has met Therapy goals to D/C to home today) PT Visit Diagnosis: Difficulty in walking, not elsewhere classified (R26.2);Muscle weakness (generalized) (M62.81);Pain Pain - Right/Left: Left Pain - part of body: Knee     Time: 2355-7322 PT Time Calculation (min) (ACUTE ONLY): 39 min  Charges:  $Gait Training: 8-22 mins $Therapeutic Exercise: 8-22 mins $Therapeutic Activity: 8-22 mins                     Rica Koyanagi  PTA Acute  Rehabilitation Services Pager      [phone] Office      [phone]

## 2020-06-15 DIAGNOSIS — M25562 Pain in left knee: Secondary | ICD-10-CM | POA: Diagnosis not present

## 2020-06-16 LAB — URINE CULTURE: Culture: 10000 — AB

## 2020-06-21 DIAGNOSIS — Z96652 Presence of left artificial knee joint: Secondary | ICD-10-CM | POA: Diagnosis not present

## 2020-06-22 ENCOUNTER — Other Ambulatory Visit: Payer: Self-pay | Admitting: Family Medicine

## 2020-06-23 DIAGNOSIS — M25562 Pain in left knee: Secondary | ICD-10-CM | POA: Diagnosis not present

## 2020-06-26 DIAGNOSIS — M25562 Pain in left knee: Secondary | ICD-10-CM | POA: Diagnosis not present

## 2020-06-28 DIAGNOSIS — M25562 Pain in left knee: Secondary | ICD-10-CM | POA: Diagnosis not present

## 2020-06-30 DIAGNOSIS — M25562 Pain in left knee: Secondary | ICD-10-CM | POA: Diagnosis not present

## 2020-07-04 DIAGNOSIS — M25562 Pain in left knee: Secondary | ICD-10-CM | POA: Diagnosis not present

## 2020-07-06 DIAGNOSIS — M25562 Pain in left knee: Secondary | ICD-10-CM | POA: Diagnosis not present

## 2020-07-13 ENCOUNTER — Ambulatory Visit (INDEPENDENT_AMBULATORY_CARE_PROVIDER_SITE_OTHER): Payer: Medicare Other | Admitting: Family Medicine

## 2020-07-13 ENCOUNTER — Encounter: Payer: Self-pay | Admitting: Family Medicine

## 2020-07-13 ENCOUNTER — Other Ambulatory Visit: Payer: Self-pay

## 2020-07-13 VITALS — BP 111/70 | HR 80 | Temp 97.6°F | Resp 20 | Ht 63.0 in | Wt 148.6 lb

## 2020-07-13 DIAGNOSIS — M25562 Pain in left knee: Secondary | ICD-10-CM | POA: Diagnosis not present

## 2020-07-13 DIAGNOSIS — E876 Hypokalemia: Secondary | ICD-10-CM | POA: Diagnosis not present

## 2020-07-13 DIAGNOSIS — N390 Urinary tract infection, site not specified: Secondary | ICD-10-CM | POA: Diagnosis not present

## 2020-07-13 DIAGNOSIS — R824 Acetonuria: Secondary | ICD-10-CM

## 2020-07-13 LAB — POCT URINALYSIS DIPSTICK OB
Bilirubin, UA: NEGATIVE
Blood, UA: NEGATIVE
Glucose, UA: NEGATIVE
Ketones, UA: NEGATIVE
Leukocytes, UA: NEGATIVE
Nitrite, UA: NEGATIVE
Spec Grav, UA: 1.02 (ref 1.010–1.025)
Urobilinogen, UA: 0.2 E.U./dL
pH, UA: 5 (ref 5.0–8.0)

## 2020-07-13 MED ORDER — BACLOFEN 10 MG PO TABS: 10.0000 mg | ORAL_TABLET | Freq: Three times a day (TID) | ORAL | 3 refills | Status: DC

## 2020-07-13 NOTE — Progress Notes (Signed)
   Subjective:    Patient ID: Becky Boyd, female    DOB: [birth year], 66 y.o.   MRN: [id number]  HPI Hospital f/u- pt had knee replacement on 1/7 and was d/c'd on 1/12.  Pt reports she has been doing well since d/c.  Completed course of Omnicef for UTI dx'd during hospitalization.  No dysuria, change in frequency or urgency.  Pt is here today to f/u UTI and the presence of ketones in urine.  Hypokalemia- K+ was low at 3.1 at time of d/c.  Pt has hx of similar   Review of Systems For ROS see HPI   This visit occurred during the SARS-CoV-2 public health emergency.  Safety protocols were in place, including screening questions prior to the visit, additional usage of staff PPE, and extensive cleaning of exam room while observing appropriate contact time as indicated for disinfecting solutions.       Objective:   Physical Exam Vitals reviewed.  Constitutional:      General: She is not in acute distress.    Appearance: Normal appearance. She is not ill-appearing.  HENT:     Head: Normocephalic and atraumatic.  Cardiovascular:     Rate and Rhythm: Normal rate and regular rhythm.  Pulmonary:     Effort: Pulmonary effort is normal. No respiratory distress.     Breath sounds: Normal breath sounds.  Abdominal:     General: There is no distension.     Palpations: Abdomen is soft.     Tenderness: There is no abdominal tenderness. There is no guarding or rebound.  Skin:    General: Skin is warm and dry.  Neurological:     General: No focal deficit present.     Mental Status: She is alert and oriented to person, place, and time.  Psychiatric:        Mood and Affect: Mood normal.        Behavior: Behavior normal.        Thought Content: Thought content normal.           Assessment & Plan:  Urinary ketones- noted during hospitalization.  UA is negative for ketones today.  Reassurance provided  Hypokalemia- pt has hx of this and was again noted in the hospital.  Check labs and  replete prn.

## 2020-07-13 NOTE — Patient Instructions (Addendum)
Follow up as needed or as scheduled We'll notify you of your lab results and make any changes if needed Your urine is clear- no ketones or signs of infection Continue to drink plenty of fluids Keep up the good work on rehab- you're doing great! Call with any questions or concerns Stay Safe!  Stay Healthy!

## 2020-07-14 ENCOUNTER — Other Ambulatory Visit: Payer: Self-pay

## 2020-07-14 DIAGNOSIS — E785 Hyperlipidemia, unspecified: Secondary | ICD-10-CM

## 2020-07-14 DIAGNOSIS — E876 Hypokalemia: Secondary | ICD-10-CM

## 2020-07-14 LAB — BASIC METABOLIC PANEL
BUN: 10 mg/dL (ref 6–23)
CO2: 27 mEq/L (ref 19–32)
Calcium: 9.3 mg/dL (ref 8.4–10.5)
Chloride: 104 mEq/L (ref 96–112)
Creatinine, Ser: 1.07 mg/dL (ref 0.40–1.20)
GFR: 54.11 mL/min — ABNORMAL LOW (ref >60.00)
Glucose, Bld: 103 mg/dL — ABNORMAL HIGH (ref 70–99)
Potassium: 3.2 mEq/L — ABNORMAL LOW (ref 3.5–5.1)
Sodium: 141 mEq/L (ref 135–145)

## 2020-07-14 MED ORDER — LEVOTHYROXINE SODIUM 88 MCG PO TABS: 88.0000 ug | ORAL_TABLET | Freq: Every day | ORAL | 0 refills | Status: DC

## 2020-07-14 MED ORDER — POTASSIUM CHLORIDE CRYS ER 20 MEQ PO TBCR: 20.0000 meq | EXTENDED_RELEASE_TABLET | Freq: Every day | ORAL | 3 refills | Status: DC

## 2020-07-18 DIAGNOSIS — M25562 Pain in left knee: Secondary | ICD-10-CM | POA: Diagnosis not present

## 2020-07-19 DIAGNOSIS — M1711 Unilateral primary osteoarthritis, right knee: Secondary | ICD-10-CM | POA: Diagnosis not present

## 2020-07-20 DIAGNOSIS — M25562 Pain in left knee: Secondary | ICD-10-CM | POA: Diagnosis not present

## 2020-07-26 DIAGNOSIS — M25562 Pain in left knee: Secondary | ICD-10-CM | POA: Diagnosis not present

## 2020-07-28 DIAGNOSIS — M25562 Pain in left knee: Secondary | ICD-10-CM | POA: Diagnosis not present

## 2020-08-01 DIAGNOSIS — M25562 Pain in left knee: Secondary | ICD-10-CM | POA: Diagnosis not present

## 2020-08-03 DIAGNOSIS — M25562 Pain in left knee: Secondary | ICD-10-CM | POA: Diagnosis not present

## 2020-08-08 DIAGNOSIS — M25562 Pain in left knee: Secondary | ICD-10-CM | POA: Diagnosis not present

## 2020-08-09 ENCOUNTER — Other Ambulatory Visit: Payer: Self-pay

## 2020-08-09 DIAGNOSIS — E876 Hypokalemia: Secondary | ICD-10-CM

## 2020-08-10 DIAGNOSIS — M25562 Pain in left knee: Secondary | ICD-10-CM | POA: Diagnosis not present

## 2020-08-11 ENCOUNTER — Other Ambulatory Visit: Payer: Medicare Other

## 2020-08-11 ENCOUNTER — Other Ambulatory Visit: Payer: Self-pay

## 2020-08-11 DIAGNOSIS — E876 Hypokalemia: Secondary | ICD-10-CM | POA: Diagnosis not present

## 2020-08-11 NOTE — Addendum Note (Signed)
Addended by: Ilda Foil on: 08/11/2020 02:48 PM   Modules accepted: Orders

## 2020-08-12 LAB — BASIC METABOLIC PANEL
BUN: 13 mg/dL (ref 7–25)
CO2: 24 mmol/L (ref 20–32)
Calcium: 9.7 mg/dL (ref 8.6–10.4)
Chloride: 106 mmol/L (ref 98–110)
Creat: 0.91 mg/dL (ref 0.50–0.99)
Glucose, Bld: 151 mg/dL — ABNORMAL HIGH (ref 65–99)
Potassium: 4.3 mmol/L (ref 3.5–5.3)
Sodium: 140 mmol/L (ref 135–146)

## 2020-08-14 DIAGNOSIS — M0579 Rheumatoid arthritis with rheumatoid factor of multiple sites without organ or systems involvement: Secondary | ICD-10-CM | POA: Diagnosis not present

## 2020-08-14 DIAGNOSIS — M722 Plantar fascial fibromatosis: Secondary | ICD-10-CM | POA: Diagnosis not present

## 2020-08-14 DIAGNOSIS — M25511 Pain in right shoulder: Secondary | ICD-10-CM | POA: Diagnosis not present

## 2020-08-14 DIAGNOSIS — M5136 Other intervertebral disc degeneration, lumbar region: Secondary | ICD-10-CM | POA: Diagnosis not present

## 2020-08-14 DIAGNOSIS — Z79899 Other long term (current) drug therapy: Secondary | ICD-10-CM | POA: Diagnosis not present

## 2020-08-14 DIAGNOSIS — M21372 Foot drop, left foot: Secondary | ICD-10-CM | POA: Diagnosis not present

## 2020-08-14 DIAGNOSIS — M797 Fibromyalgia: Secondary | ICD-10-CM | POA: Diagnosis not present

## 2020-08-14 DIAGNOSIS — M359 Systemic involvement of connective tissue, unspecified: Secondary | ICD-10-CM | POA: Diagnosis not present

## 2020-08-16 ENCOUNTER — Telehealth: Payer: Self-pay | Admitting: Family Medicine

## 2020-08-16 NOTE — Telephone Encounter (Signed)
Left message for patient to schedule Annual Wellness Visit.  Please schedule with Nurse Health Advisor Martha Stanley, RN at [place] [facility]  

## 2020-08-17 DIAGNOSIS — M25562 Pain in left knee: Secondary | ICD-10-CM | POA: Diagnosis not present

## 2020-08-22 ENCOUNTER — Other Ambulatory Visit: Payer: Self-pay

## 2020-08-22 DIAGNOSIS — R053 Chronic cough: Secondary | ICD-10-CM

## 2020-08-22 DIAGNOSIS — M25562 Pain in left knee: Secondary | ICD-10-CM | POA: Diagnosis not present

## 2020-08-22 MED ORDER — PANTOPRAZOLE SODIUM 40 MG PO TBEC: 40.0000 mg | DELAYED_RELEASE_TABLET | Freq: Every day | ORAL | 0 refills | Status: DC

## 2020-09-02 ENCOUNTER — Other Ambulatory Visit: Payer: Self-pay | Admitting: Family Medicine

## 2020-09-02 DIAGNOSIS — E785 Hyperlipidemia, unspecified: Secondary | ICD-10-CM

## 2020-09-16 ENCOUNTER — Other Ambulatory Visit: Payer: Self-pay | Admitting: Family Medicine

## 2020-09-25 ENCOUNTER — Other Ambulatory Visit: Payer: Self-pay | Admitting: Family Medicine

## 2020-09-25 DIAGNOSIS — E785 Hyperlipidemia, unspecified: Secondary | ICD-10-CM

## 2020-09-27 ENCOUNTER — Encounter: Payer: Self-pay | Admitting: Family Medicine

## 2020-09-28 ENCOUNTER — Other Ambulatory Visit: Payer: Self-pay

## 2020-09-28 MED ORDER — ATORVASTATIN CALCIUM 20 MG PO TABS: 1.0000 | ORAL_TABLET | Freq: Every day | ORAL | 1 refills | Status: DC

## 2020-10-23 ENCOUNTER — Other Ambulatory Visit: Payer: Self-pay | Admitting: Family Medicine

## 2020-10-23 DIAGNOSIS — E785 Hyperlipidemia, unspecified: Secondary | ICD-10-CM

## 2020-11-14 DIAGNOSIS — M797 Fibromyalgia: Secondary | ICD-10-CM | POA: Diagnosis not present

## 2020-11-14 DIAGNOSIS — M359 Systemic involvement of connective tissue, unspecified: Secondary | ICD-10-CM | POA: Diagnosis not present

## 2020-11-14 DIAGNOSIS — Z79899 Other long term (current) drug therapy: Secondary | ICD-10-CM | POA: Diagnosis not present

## 2020-11-14 DIAGNOSIS — M21372 Foot drop, left foot: Secondary | ICD-10-CM | POA: Diagnosis not present

## 2020-11-14 DIAGNOSIS — M722 Plantar fascial fibromatosis: Secondary | ICD-10-CM | POA: Diagnosis not present

## 2020-11-14 DIAGNOSIS — M5136 Other intervertebral disc degeneration, lumbar region: Secondary | ICD-10-CM | POA: Diagnosis not present

## 2020-11-14 DIAGNOSIS — M25511 Pain in right shoulder: Secondary | ICD-10-CM | POA: Diagnosis not present

## 2020-11-14 DIAGNOSIS — M0579 Rheumatoid arthritis with rheumatoid factor of multiple sites without organ or systems involvement: Secondary | ICD-10-CM | POA: Diagnosis not present

## 2020-11-21 ENCOUNTER — Other Ambulatory Visit: Payer: Self-pay | Admitting: Family Medicine

## 2020-11-21 DIAGNOSIS — R053 Chronic cough: Secondary | ICD-10-CM

## 2020-11-23 ENCOUNTER — Other Ambulatory Visit: Payer: Self-pay | Admitting: Family Medicine

## 2020-11-23 DIAGNOSIS — R053 Chronic cough: Secondary | ICD-10-CM

## 2020-11-23 DIAGNOSIS — E785 Hyperlipidemia, unspecified: Secondary | ICD-10-CM

## 2020-11-29 ENCOUNTER — Encounter: Payer: Self-pay | Admitting: *Deleted

## 2020-11-30 ENCOUNTER — Other Ambulatory Visit: Payer: Self-pay | Admitting: Family Medicine

## 2020-11-30 DIAGNOSIS — E785 Hyperlipidemia, unspecified: Secondary | ICD-10-CM

## 2020-12-07 ENCOUNTER — Other Ambulatory Visit: Payer: Self-pay | Admitting: Family Medicine

## 2020-12-07 DIAGNOSIS — E785 Hyperlipidemia, unspecified: Secondary | ICD-10-CM

## 2020-12-09 ENCOUNTER — Other Ambulatory Visit: Payer: Self-pay | Admitting: Family Medicine

## 2020-12-09 DIAGNOSIS — E785 Hyperlipidemia, unspecified: Secondary | ICD-10-CM

## 2020-12-14 ENCOUNTER — Other Ambulatory Visit: Payer: Self-pay | Admitting: Family Medicine

## 2020-12-18 ENCOUNTER — Encounter: Payer: Self-pay | Admitting: Family Medicine

## 2020-12-22 ENCOUNTER — Ambulatory Visit: Payer: Medicare Other | Admitting: Family Medicine

## 2020-12-26 ENCOUNTER — Telehealth: Payer: Self-pay | Admitting: Family Medicine

## 2020-12-26 NOTE — Telephone Encounter (Signed)
Left message for patient to schedule Annual Wellness Visit.  Please schedule with Nurse Health Advisor Julie Greer, RN at [place] [facility]   

## 2020-12-31 ENCOUNTER — Other Ambulatory Visit: Payer: Self-pay | Admitting: Family Medicine

## 2020-12-31 DIAGNOSIS — E785 Hyperlipidemia, unspecified: Secondary | ICD-10-CM

## 2021-01-10 ENCOUNTER — Telehealth: Payer: Self-pay | Admitting: Family Medicine

## 2021-01-10 NOTE — Telephone Encounter (Signed)
Left message for patient to call back and schedule Medicare Annual Wellness Visit (AWV).   Please offer to do virtually or by telephone.  Left office number and my jabber [phone].  Last AWV:10/08/2017  Please schedule at anytime with Nurse Health Advisor.

## 2021-01-27 ENCOUNTER — Other Ambulatory Visit: Payer: Self-pay | Admitting: Family Medicine

## 2021-01-27 DIAGNOSIS — E785 Hyperlipidemia, unspecified: Secondary | ICD-10-CM

## 2021-01-29 ENCOUNTER — Other Ambulatory Visit: Payer: Self-pay | Admitting: Family Medicine

## 2021-01-29 NOTE — Telephone Encounter (Signed)
LFD 12/14/20 #90 with no refills LOV 07/13/20 NOV 02/19/21

## 2021-02-14 DIAGNOSIS — M5136 Other intervertebral disc degeneration, lumbar region: Secondary | ICD-10-CM | POA: Diagnosis not present

## 2021-02-14 DIAGNOSIS — Z79899 Other long term (current) drug therapy: Secondary | ICD-10-CM | POA: Diagnosis not present

## 2021-02-14 DIAGNOSIS — M21372 Foot drop, left foot: Secondary | ICD-10-CM | POA: Diagnosis not present

## 2021-02-14 DIAGNOSIS — M25511 Pain in right shoulder: Secondary | ICD-10-CM | POA: Diagnosis not present

## 2021-02-14 DIAGNOSIS — M359 Systemic involvement of connective tissue, unspecified: Secondary | ICD-10-CM | POA: Diagnosis not present

## 2021-02-14 DIAGNOSIS — M722 Plantar fascial fibromatosis: Secondary | ICD-10-CM | POA: Diagnosis not present

## 2021-02-14 DIAGNOSIS — M797 Fibromyalgia: Secondary | ICD-10-CM | POA: Diagnosis not present

## 2021-02-14 DIAGNOSIS — M0579 Rheumatoid arthritis with rheumatoid factor of multiple sites without organ or systems involvement: Secondary | ICD-10-CM | POA: Diagnosis not present

## 2021-02-16 ENCOUNTER — Telehealth: Payer: Self-pay

## 2021-02-16 NOTE — Telephone Encounter (Signed)
Left message on voicemail req pt call office to schedule AWV with health advisor or with PCP

## 2021-02-19 ENCOUNTER — Other Ambulatory Visit: Payer: Self-pay

## 2021-02-19 ENCOUNTER — Encounter: Payer: Self-pay | Admitting: Family Medicine

## 2021-02-19 ENCOUNTER — Ambulatory Visit (INDEPENDENT_AMBULATORY_CARE_PROVIDER_SITE_OTHER): Payer: Medicare Other | Admitting: Family Medicine

## 2021-02-19 VITALS — BP 116/80 | HR 75 | Temp 97.7°F | Resp 17 | Ht 63.0 in | Wt 146.8 lb

## 2021-02-19 DIAGNOSIS — R9082 White matter disease, unspecified: Secondary | ICD-10-CM

## 2021-02-19 DIAGNOSIS — Z23 Encounter for immunization: Secondary | ICD-10-CM
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
## 2021-02-19 NOTE — Patient Instructions (Signed)
Schedule your complete physical in 3 months No need for labs today- yay! We'll call you to schedule your MRI and neurology appts Call with any questions or concerns Stay Safe!  Stay Healthy! Happy Fall!!!

## 2021-02-20 ENCOUNTER — Other Ambulatory Visit: Payer: Self-pay | Admitting: Family Medicine

## 2021-02-20 DIAGNOSIS — R053 Chronic cough: Secondary | ICD-10-CM

## 2021-02-25 ENCOUNTER — Other Ambulatory Visit: Payer: Self-pay | Admitting: Family Medicine

## 2021-02-25 ENCOUNTER — Other Ambulatory Visit: Payer: Self-pay | Admitting: Family

## 2021-02-25 DIAGNOSIS — E785 Hyperlipidemia, unspecified: Secondary | ICD-10-CM

## 2021-03-01 DIAGNOSIS — M25561 Pain in right knee: Secondary | ICD-10-CM | POA: Diagnosis not present

## 2021-03-01 DIAGNOSIS — M25562 Pain in left knee: Secondary | ICD-10-CM | POA: Diagnosis not present

## 2021-03-10 ENCOUNTER — Ambulatory Visit
Admission: RE | Admit: 2021-03-10 | Discharge: 2021-03-10 | Disposition: A | Discharge status: Home / Self Care | Payer: Medicare Other | Source: Ambulatory Visit | Attending: Family Medicine | Admitting: Family Medicine

## 2021-03-10 DIAGNOSIS — R42 Dizziness and giddiness: Secondary | ICD-10-CM | POA: Diagnosis not present

## 2021-03-10 DIAGNOSIS — R413 Other amnesia: Secondary | ICD-10-CM | POA: Diagnosis not present

## 2021-03-10 DIAGNOSIS — R9082 White matter disease, unspecified: Secondary | ICD-10-CM

## 2021-03-10 MED ORDER — GADOBENATE DIMEGLUMINE 529 MG/ML IV SOLN
13.0000 mL | Freq: Once | INTRAVENOUS | Status: AC | PRN
  Administered 2021-03-10: 13 mL via INTRAVENOUS

## 2021-03-13 ENCOUNTER — Encounter: Payer: Self-pay | Admitting: Family Medicine

## 2021-03-18 ENCOUNTER — Encounter: Payer: Self-pay | Admitting: Family Medicine

## 2021-03-24 ENCOUNTER — Other Ambulatory Visit: Payer: Self-pay | Admitting: Family Medicine

## 2021-03-24 DIAGNOSIS — E785 Hyperlipidemia, unspecified: Secondary | ICD-10-CM

## 2021-03-25 ENCOUNTER — Other Ambulatory Visit: Payer: Self-pay | Admitting: Family Medicine

## 2021-04-23 ENCOUNTER — Telehealth: Payer: Self-pay | Admitting: Family Medicine

## 2021-05-01 ENCOUNTER — Other Ambulatory Visit: Payer: Self-pay | Admitting: Family Medicine

## 2021-05-01 DIAGNOSIS — E785 Hyperlipidemia, unspecified: Secondary | ICD-10-CM

## 2021-05-03 ENCOUNTER — Encounter: Payer: Self-pay | Admitting: Neurology

## 2021-05-03 ENCOUNTER — Ambulatory Visit: Payer: Medicare Other | Admitting: Neurology

## 2021-05-03 VITALS — BP 167/98 | HR 76 | Ht 63.0 in | Wt 151.2 lb

## 2021-05-03 DIAGNOSIS — I776 Arteritis, unspecified: Secondary | ICD-10-CM

## 2021-05-03 DIAGNOSIS — R413 Other amnesia: Secondary | ICD-10-CM | POA: Diagnosis not present

## 2021-05-03 DIAGNOSIS — M3500 Sicca syndrome, unspecified: Secondary | ICD-10-CM | POA: Diagnosis not present

## 2021-05-03 DIAGNOSIS — M3507 Sjogren syndrome with central nervous system involvement: Secondary | ICD-10-CM | POA: Diagnosis not present

## 2021-05-03 DIAGNOSIS — M05711 Rheumatoid arthritis with rheumatoid factor of right shoulder without organ or systems involvement: Secondary | ICD-10-CM

## 2021-05-03 DIAGNOSIS — R29898 Other symptoms and signs involving the musculoskeletal system: Secondary | ICD-10-CM

## 2021-05-03 DIAGNOSIS — R42 Dizziness and giddiness: Secondary | ICD-10-CM | POA: Diagnosis not present

## 2021-05-03 DIAGNOSIS — R9082 White matter disease, unspecified: Secondary | ICD-10-CM | POA: Diagnosis not present

## 2021-05-03 NOTE — Patient Instructions (Addendum)
Differential includes demyelinating disease(Multiple Sclerosis) vs Vasculitis vs cerebral small vessel disease.  Extensive Blood work today for multiple causes of vasculitis (inflammation of the blood vessels) and other etiologies of changes seen in her brain and memory loss  Lumbar puncture to check for MS and Vasculitis/inflammation   CTA of the head   Specialist at [facility]

## 2021-05-03 NOTE — Progress Notes (Addendum)
[facility]    Provider:  Dr Jaynee Eagles Referring Provider: Midge Minium, MD Primary Care Physician:  Midge Minium, MD  CC:  Headaches, abnormal white matter changes on MRI brain progressive with new neurologic symptoms. She has a PMHx of sjogren's, sicca syndrome, RA, fibromyalgia, hypothyroidism, lupus  HPI May 03, 2021: Becky Boyd is a 65 year old female here as a referral from Dr. Birdie Riddle.  We saw her in the past over 3 years ago for headaches but that was over 3 years ago and patient's headaches had resolved and she declined repeat brain Imaging and workup at that time.  Patient is worried about her brain, having Multiple Sclerosis.  Patient has a history of white matter changes in the brain.  I reviewed Dr. Virgil Benedict notes: Daughter is noticing increased difficulty with coordination, left foot drop, dizziness, confusion, the confusion definitely worsened after her near death battle with [place].  They requested an MS specialist at [facility]. I explained that I am a general neurologist and not an [place] specialist but I can certainly start an evaluation (recommend in the past when I saw her in 2019) and end her to a specialist.  I reviewed her imaging with our MS specialist, and given her age and some of the findings and 2 prior negative LP workups for MS he states there is a less likelihood of MS. However there is no question her white-matter abnormalities have significantly progressed based on imaging from 2017 and even further back(see imaging pictures in assessment and plan). We discussed at length, reviewed images together, I recommend repeating the workup and also repeating lumbar puncture and CTA of the head and neck and sending to [place] at [facility]. May need a cerebral angiogram.  Per patient, her memory is "gone", she is having difficult thinking. She gets confused very easily. Talking to her is like playing charades, she forgets what she wants to say. She can  remember things from a long time ago but not from this morning. She gets off balance. She now has a foot weakness left mostly. She has strange sensations in the lower extremities. She gets dizzy for no reason when she stands up or standing can be room spinning or lightheaded, brief, does it then goes away. Her eyesight is getting very bad, she only uses drug-store glasses, she is going to the eye doctor, her neck is fused s/p surgery, she has a lot of low back pain that radiates into the right buttocks. No significant numbness or tingling in the feet or the hands. Feet may tingle occasionally. She is really fatigued and tired of being tired. She does not know if she snores, daughter says she snores, she has sjogren's and RA and also has a lot of joint and muscle disease.  No smoking, no drinking. No other focal neurologic deficits, associated symptoms, inciting events or modifiable factors.   Sees Leigh Aurora in [place] [facility]  MRI 03/12/2021: personally reviewed and agree with the following  No acute abnormality   Progressive hyperintense signal changes in the white matter, basal ganglia, and pons compared with 2017. Differential diagnosis includes progressive chronic microvascular ischemic change or vasculitis. Correlate with risk factors for small vessel disease. Demyelinating disease also possible.  HPI 10/15/2017:  Becky Boyd is a  here as a referral from Dr. Birdie Riddle for headaches. But she says she is not sure why she is here. She used to see Dr. Melton Alar for headaches which are improved. Every few years he would send  her for MRI of her brain bc of white matter changes, we reviewed the images of her brain together and discussed chronic white matter changes. . She has dizziness. She has pain in her legs. Her lower back hurts, she has 12-13/10 in pain, it hurts across the lower back and into the lower right buttocks. Pain is worsening. The calves in her legs really hurt. If she walk far she  has to stop pr bend over and then continue. Her memory is poor but neuropsych testing in the past was unremarkable. She can't afford physical therapy. She has a lot of pain. However she declines any treatment or any testing, she says her headaches are not that bad or that often, she sees her doctor for pain and just wanted to ask me some questions. I encouraged her to return if symptoms worsen. She says she has a lot of chronic problems but nothing acute or new or needing attention per patient.   Reviewed notes, labs and imaging from outside physicians, which showed:    Reviewed MRi images and agree with the following: Progressive, moderate cerebral white matter and brainstem signal changes from 2015, nonspecific. This may reflect demyelinating disease, chronic small vessel ischemia, or vasculitis among others.   TSH, cbc, cmp normal.   LP MS panel was negative for MS 2015  Review of Systems: Patient complains of symptoms per HPI as well as the following symptoms: foot drop . Pertinent negatives and positives per HPI. All others negative    Social History   Socioeconomic History   Marital status: Married    Spouse name: Not on file   Number of children: 2   Years of education: Not on file   Highest education level: High school graduate  Occupational History   Not on file  Tobacco Use   Smoking status: Never   Smokeless tobacco: Never  Vaping Use   Vaping Use: Never used  Substance and Sexual Activity   Alcohol use: Never   Drug use: Never   Sexual activity: Not on file  Other Topics Concern   Not on file  Social History Narrative   Lives at home with her husband   Right handed   Caffeine: occasional pepsi or mtn dew   Social Determinants of Health   Financial Resource Strain: Not on file  Food Insecurity: Not on file  Transportation Needs: Not on file  Physical Activity: Not on file  Stress: Not on file  Social Connections: Not on file  Intimate Partner Violence:  Not on file    Family History  Problem Relation Age of Onset   Diabetes Sister    Breast cancer Paternal Aunt    COPD Mother    Heart disease Father    COPD Brother    Aneurysm Paternal Aunt    Diabetes Other     Past Medical History:  Diagnosis Date   Aneurysm ([place])    Anorexia    COVID    Dysphagia    Fibromyalgia    Fibromyalgia    GERD (gastroesophageal reflux disease)    Hashimoto thyroiditis    History of kidney stones    hx of multiple kidney stones    Hypothyroidism    Kidney stones    Lupus ([place])    Palpitations    Rheumatoid arthritis ([place])    Sjogren's disease ([place])     Past Surgical History:  Procedure Laterality Date   ABDOMINAL HYSTERECTOMY     CERVICAL FUSION  2004  C4-C5 with Instrumentation   CERVICAL FUSION  1990   C3-C4   cervical rods     CHOLECYSTECTOMY     kidney stone     l5-s1 surgery     LUMBAR MICRODISCECTOMY  2004   Bilateral L5-S1   TOTAL KNEE ARTHROPLASTY Left 06/09/2020   Procedure: TOTAL KNEE ARTHROPLASTY;  Surgeon: Susa Day, MD;  Location: [facility];  Service: Orthopedics;  Laterality: Left;    Current Outpatient Medications  Medication Sig Dispense Refill   aspirin 81 MG EC tablet TAKE 1 TABLET BY MOUTH ONCE A DAY 40 tablet 1   aspirin EC 81 MG tablet Take 1 tablet (81 mg total) by mouth daily. 14 tablet 1   atorvastatin (LIPITOR) 20 MG tablet TAKE ONE TABLET BY MOUTH ONE TIME DAILY 90 tablet 0   baclofen (LIORESAL) 10 MG tablet TAKE ONE TABLET BY MOUTH THREE TIMES DAILY 90 tablet 0   Carboxymethylcellul-Glycerin (LUBRICATING EYE DROPS OP) Place 1 drop into both eyes daily as needed (dry eyes).     DULoxetine (CYMBALTA) 60 MG capsule Take 60 mg by mouth at bedtime.     levothyroxine (SYNTHROID) 88 MCG tablet TAKE ONE TABLET BY MOUTH ONE TIME DAILY 30 tablet 0   MODERNA COVID-19 VACCINE 100 MCG/0.5ML injection      Multiple Vitamin (MULTIVITAMIN WITH MINERALS) TABS tablet Take 1 tablet by mouth daily. 30 tablet 0    pantoprazole (PROTONIX) 40 MG tablet TAKE ONE TABLET BY MOUTH ONE TIME DAILY 90 tablet 0   predniSONE (DELTASONE) 5 MG tablet Take 5 mg by mouth daily.     sulfaSALAzine (AZULFIDINE) 500 MG tablet Take 1,000 mg by mouth 2 (two) times daily.     No current facility-administered medications for this visit.    Allergies as of 05/03/2021 - Review Complete 05/03/2021  Allergen Reaction Noted   Plaquenil [hydroxychloroquine sulfate] Other (See Comments) 09/30/2012    Vitals: BP (!) 167/98   Pulse 76   Ht _0  (1.6 m)   Wt 151 lb 3.2 oz (68.6 kg)   BMI 26.78 kg/m  Last Weight:  Wt Readings from Last 1 Encounters:  05/03/21 151 lb 3.2 oz (68.6 kg)   Last Height:   Ht Readings from Last 1 Encounters:  05/03/21 _1  (1.6 m)    Physical exam: Exam: Gen: NAD, conversant, well nourised, well groomed                     CV: RRR, no MRG. No Carotid Bruits. No peripheral edema, warm, nontender Eyes: Conjunctivae clear without exudates or hemorrhage  Neuro: Detailed Neurologic Exam  Speech:    Speech is normal; fluent and spontaneous with normal comprehension.  Cognition:   MMSE - Mini Mental State Exam 05/03/2021 10/08/2017  Orientation to time 5 4  Orientation to Place 5 5  Registration 3 3  Attention/ Calculation 4 5  Recall 2 3  Language- name 2 objects 2 2  Language- repeat 1 1  Language- follow 3 step command 3 3  Language- read & follow direction 1 1  Write a sentence 1 1  Copy design 1 1  Total score 28 29       The patient is oriented to person, place, and time;     recent and remote memory intact;     language fluent;     normal attention, concentration,     fund of knowledge Cranial Nerves:    The pupils are equal, round, and  reactive to light. Pupils too small to bisualize pupils.  Visual fields are full to finger confrontation. Extraocular movements are intact. Trigeminal sensation is intact and the muscles of mastication are normal. The face is symmetric. The  palate elevates in the midline. Hearing intact. Voice is normal. Shoulder shrug is normal. The tongue has normal motion without fasciculations.   Coordination:    Normal finger to nose and heel to shin.   Gait:    Heel-toe and tandem gait intact but with imbalance.   Motor Observation:    No asymmetry, no atrophy, and no involuntary movements noted. Tone:    Normal muscle tone.    Posture:    Posture is normal. normal erect    Strength: she has some prox weakness but I believe this is due to pain and that her strength is intact and symmetrical. I did not appreciate much of a foot drop, she was able to walk on both heels, maybe 5-/5 left dorsiflexion.      Sensation: intact to LT, pin prick distally, and vibration     Reflex Exam:  DTR's: AJs 1-2+, patellars difficult due to pain but appaer 2+, biceps 2+,   symmetrical  Toes:    The toes are downgoing bilaterally.   Clonus:    Clonus is absent.    Assessment/Plan:  Ms. Rehmann is a 65 year old female here as a referral from Dr. Birdie Riddle.    Patient has a history of white matter changes in the brain.  I reviewed her imaging with our MS specialist, and given her age and some of the findings and 2 prior negative LP workups for MS he states there is a less likelihood of MS. However there is no question her white-matter abnormalities appear demyelinating have significantly progressed based on imaging from 2017 and even further back(see imaging pictures in assessment and plan). Could also be vasculitis/inflammatory given her extensive rheumatologic conditions(Sjogrn's, Sicca syndrome, RA). May also be chronic microvascular changes. We discussed at length, reviewed images together, I recommend repeating the workup and also repeating lumbar puncture and CTA of the head and neck and sending to [place] at [facility]. May need a cerebral angiogram. Sees Dr. Amil Amen Rheumatology who appears to be treating with low-dose steroids(will CC him, I do not  have his records in [place])  Addendum 05/22/2021: Elevated WBCs in the csf (may be due to chronic prednisone?) with lymphocytic increase with normal protein and glucose. CTA of the head unremarkable, no sign of vasculitis or detectable atherosclerotic small vessel disease. Recommended cerebral angiogram but patient hesitant. Patient asked for referral to [place] for another opinion, I agree will send her to [facility]. Referral placed.     Below are t2 flair from 2022 and 2017  Sagittal flair 2022    Axial Flair 2022 vs 2017       Orders Placed This Encounter  Procedures   CT ANGIO HEAD W OR WO CONTRAST   CT ANGIO NECK W OR WO CONTRAST   DG FLUORO GUIDED LOC OF NEEDLE/CATH TIP FOR SPINAL INJECT LT   Sedimentation rate   C-reactive protein   ANCA Profile   Cryoglobulin   ANA, IFA (with reflex)   Sjogren's syndrome antibods(ssa + ssb)   Rheumatoid factor   ANA Comprehensive Panel   ANA, IFA Comprehensive Panel   Complement component c1q   Complement, total   C3 complement   C4 complement   Hemoglobin A1c   B12 and Folate Panel   Methylmalonic acid, serum  Vitamin B1   Multiple Myeloma Panel (SPEP&IFE w/QIG)   TSH   HIV Antibody (routine testing w rflx)   Hepatitis C antibody   Heavy metals, blood   Vitamin B6   CBC with Differential/Platelets   Comprehensive metabolic panel   Ambulatory referral to Neurology    Cc: Dr. Lorelee Market, MD  [facility] [address] [place], [place] [address]  Phone [phone] Fax [phone]  I spent over 105 minutes of face-to-face and non-face-to-face time with patient on the  1. Confluent parieto-occipital white matter abnormalities present on magnetic resonance imaging   2. Memory loss   3. Weakness of both lower extremities   4. Dizziness   5. Sicca syndrome ([place])   6. Rheumatoid arthritis involving right shoulder with positive rheumatoid factor (HCC)   7. Sjogren's  syndrome with central nervous system involvement ([place])   8. White matter abnormality on MRI of brain   9. Arteritis ([place])   10. CNS vasculitis (HCC)    diagnosis.  This included previsit chart review, lab review, study review, order entry, electronic health record documentation, patient education on the different diagnostic and therapeutic options, counseling and coordination of care, risks and benefits of management, compliance, or risk factor reduction  Cc: Dr. Birdie Riddle, Dr. Amil Amen

## 2021-05-05 ENCOUNTER — Encounter: Payer: Self-pay | Admitting: Neurology

## 2021-05-06 ENCOUNTER — Encounter: Payer: Self-pay | Admitting: Neurology

## 2021-05-07 ENCOUNTER — Other Ambulatory Visit: Payer: Self-pay | Admitting: Neurology

## 2021-05-07 ENCOUNTER — Telehealth: Payer: Self-pay | Admitting: Neurology

## 2021-05-07 NOTE — Telephone Encounter (Signed)
[employer] Medicare no auth req- sent the CT order's to GI they will reach out to the patient to schedule.  Sent the LP order to [facility], she will reach out to the patient to schedule.

## 2021-05-08 ENCOUNTER — Other Ambulatory Visit: Payer: Self-pay | Admitting: Family Medicine

## 2021-05-08 DIAGNOSIS — E785 Hyperlipidemia, unspecified: Secondary | ICD-10-CM

## 2021-05-09 LAB — CBC WITH DIFFERENTIAL/PLATELET
Basophils Absolute: 0.1 10*3/uL (ref 0.0–0.2)
Basos: 1 %
EOS (ABSOLUTE): 0.2 10*3/uL (ref 0.0–0.4)
Eos: 3 %
Hematocrit: 36.3 % (ref 34.0–46.6)
Hemoglobin: 12 g/dL (ref 11.1–15.9)
Immature Grans (Abs): 0.1 10*3/uL (ref 0.0–0.1)
Immature Granulocytes: 1 %
Lymphocytes Absolute: 1.4 10*3/uL (ref 0.7–3.1)
Lymphs: 16 %
MCH: 30.1 pg (ref 26.6–33.0)
MCHC: 33.1 g/dL (ref 31.5–35.7)
MCV: 91 fL (ref 79–97)
Monocytes Absolute: 0.5 10*3/uL (ref 0.1–0.9)
Monocytes: 6 %
Neutrophils Absolute: 6.7 10*3/uL (ref 1.4–7.0)
Neutrophils: 73 %
Platelets: 261 10*3/uL (ref 150–450)
RBC: 3.99 x10E6/uL (ref 3.77–5.28)
RDW: 13.1 % (ref 11.7–15.4)
WBC: 9 10*3/uL (ref 3.4–10.8)

## 2021-05-09 LAB — MULTIPLE MYELOMA PANEL, SERUM
Albumin SerPl Elph-Mcnc: 4.2 g/dL (ref 2.9–4.4)
Albumin/Glob SerPl: 1.3 (ref 0.7–1.7)
Alpha 1: 0.3 g/dL (ref 0.0–0.4)
Alpha2 Glob SerPl Elph-Mcnc: 0.8 g/dL (ref 0.4–1.0)
B-Globulin SerPl Elph-Mcnc: 1 g/dL (ref 0.7–1.3)
Gamma Glob SerPl Elph-Mcnc: 1.4 g/dL (ref 0.4–1.8)
Globulin, Total: 3.5 g/dL (ref 2.2–3.9)
IgA/Immunoglobulin A, Serum: 125 mg/dL (ref 87–352)
IgG (Immunoglobin G), Serum: 1293 mg/dL (ref 586–1602)
IgM (Immunoglobulin M), Srm: 107 mg/dL (ref 26–217)

## 2021-05-09 LAB — ANA COMPREHENSIVE PANEL
Anti JO-1: <0.2 AI (ref 0.0–0.9)
Centromere Ab Screen: <0.2 AI (ref 0.0–0.9)
Chromatin Ab SerPl-aCnc: 0.8 AI (ref 0.0–0.9)
ENA RNP Ab: <0.2 AI (ref 0.0–0.9)
ENA SM Ab Ser-aCnc: <0.2 AI (ref 0.0–0.9)
ENA SSA (RO) Ab: >8 AI — ABNORMAL HIGH (ref 0.0–0.9)
ENA SSB (LA) Ab: <0.2 AI (ref 0.0–0.9)
Scleroderma (Scl-70) (ENA) Antibody, IgG: <0.2 AI (ref 0.0–0.9)
dsDNA Ab: 3 IU/mL (ref 0–9)

## 2021-05-09 LAB — ANCA PROFILE
Anti-MPO Antibodies: <0.2 units (ref 0.0–0.9)
Anti-PR3 Antibodies: <0.2 units (ref 0.0–0.9)
Atypical pANCA: <1:20 {titer}
C-ANCA: <1:20 {titer}
P-ANCA: <1:20 {titer}

## 2021-05-09 LAB — ANTINUCLEAR ANTIBODIES, IFA: ANA Titer 1: NEGATIVE

## 2021-05-09 LAB — VITAMIN B6: Vitamin B6: 13.1 ug/L (ref 3.4–65.2)

## 2021-05-09 LAB — RHEUMATOID FACTOR: Rheumatoid fact SerPl-aCnc: <10 IU/mL (ref <14.0)

## 2021-05-09 LAB — METHYLMALONIC ACID, SERUM: Methylmalonic Acid: 223 nmol/L (ref 0–378)

## 2021-05-09 LAB — C3 COMPLEMENT: Complement C3, Serum: 134 mg/dL (ref 82–167)

## 2021-05-09 LAB — VITAMIN B1: Thiamine: 143.5 nmol/L (ref 66.5–200.0)

## 2021-05-09 LAB — COMPREHENSIVE METABOLIC PANEL
ALT: 15 IU/L (ref 0–32)
AST: 24 IU/L (ref 0–40)
Albumin/Globulin Ratio: 1.9 (ref 1.2–2.2)
Albumin: 5 g/dL — ABNORMAL HIGH (ref 3.8–4.8)
Alkaline Phosphatase: 131 IU/L — ABNORMAL HIGH (ref 44–121)
BUN/Creatinine Ratio: 14 (ref 12–28)
BUN: 13 mg/dL (ref 8–27)
Bilirubin Total: 0.3 mg/dL (ref 0.0–1.2)
CO2: 25 mmol/L (ref 20–29)
Calcium: 10.1 mg/dL (ref 8.7–10.3)
Chloride: 103 mmol/L (ref 96–106)
Creatinine, Ser: 0.91 mg/dL (ref 0.57–1.00)
Globulin, Total: 2.7 g/dL (ref 1.5–4.5)
Glucose: 93 mg/dL (ref 70–99)
Potassium: 4.4 mmol/L (ref 3.5–5.2)
Sodium: 141 mmol/L (ref 134–144)
Total Protein: 7.7 g/dL (ref 6.0–8.5)
eGFR: 69 mL/min/{1.73_m2} (ref >59)

## 2021-05-09 LAB — COMPLEMENT, TOTAL: Compl, Total (CH50): >60 U/mL (ref >41)

## 2021-05-09 LAB — CRYOGLOBULIN

## 2021-05-09 LAB — C-REACTIVE PROTEIN: CRP: 4 mg/L (ref 0–10)

## 2021-05-09 LAB — HEMOGLOBIN A1C
Est. average glucose Bld gHb Est-mCnc: 97 mg/dL
Hgb A1c MFr Bld: 5 % (ref 4.8–5.6)

## 2021-05-09 LAB — HEAVY METALS, BLOOD
Arsenic: <1 ug/L (ref 0–9)
Lead, Blood: <1 ug/dL (ref 0.0–3.4)
Mercury: <1 ug/L (ref 0.0–14.9)

## 2021-05-09 LAB — HEPATITIS C ANTIBODY: Hep C Virus Ab: <0.1 s/co ratio (ref 0.0–0.9)

## 2021-05-09 LAB — B12 AND FOLATE PANEL
Folate: 5.9 ng/mL (ref >3.0)
Vitamin B-12: 491 pg/mL (ref 232–1245)

## 2021-05-09 LAB — C4 COMPLEMENT: Complement C4, Serum: 26 mg/dL (ref 12–38)

## 2021-05-09 LAB — HIV ANTIBODY (ROUTINE TESTING W REFLEX): HIV Screen 4th Generation wRfx: NONREACTIVE

## 2021-05-09 LAB — SEDIMENTATION RATE: Sed Rate: 26 mm/hr (ref 0–40)

## 2021-05-09 LAB — TSH: TSH: 2.12 u[IU]/mL (ref 0.450–4.500)

## 2021-05-09 LAB — COMPLEMENT COMPONENT C1Q: Complement C1Q: 15.8 mg/dL (ref 10.3–20.5)

## 2021-05-15 DIAGNOSIS — M1711 Unilateral primary osteoarthritis, right knee: Secondary | ICD-10-CM | POA: Diagnosis not present

## 2021-05-15 DIAGNOSIS — M545 Low back pain, unspecified: Secondary | ICD-10-CM | POA: Diagnosis not present

## 2021-05-17 ENCOUNTER — Inpatient Hospital Stay (HOSPITAL_COMMUNITY): Admit: 2021-05-17 | Payer: Medicare Other

## 2021-05-17 ENCOUNTER — Other Ambulatory Visit: Payer: Self-pay

## 2021-05-17 ENCOUNTER — Other Ambulatory Visit (HOSPITAL_COMMUNITY)
Admission: RE | Admit: 2021-05-17 | Discharge: 2021-05-17 | Disposition: A | Discharge status: Home / Self Care | Payer: Medicare Other | Source: Ambulatory Visit | Attending: Neurology | Admitting: Neurology

## 2021-05-17 ENCOUNTER — Ambulatory Visit
Admission: RE | Admit: 2021-05-17 | Discharge: 2021-05-17 | Disposition: A | Discharge status: Home / Self Care | Payer: Medicare Other | Source: Ambulatory Visit | Attending: Neurology | Admitting: Neurology

## 2021-05-17 DIAGNOSIS — R9082 White matter disease, unspecified: Secondary | ICD-10-CM | POA: Diagnosis not present

## 2021-05-17 DIAGNOSIS — I776 Arteritis, unspecified: Secondary | ICD-10-CM | POA: Diagnosis not present

## 2021-05-17 NOTE — Progress Notes (Signed)
1 vial of blood drawn from pts Left hand by A. Chad, RN to be sent with LP lab work. 1 successful attempt. Pt tolerated well. Gauze and tape applied.

## 2021-05-17 NOTE — Discharge Instructions (Signed)

## 2021-05-18 ENCOUNTER — Other Ambulatory Visit: Payer: Medicare Other

## 2021-05-18 ENCOUNTER — Ambulatory Visit
Admission: RE | Admit: 2021-05-18 | Discharge: 2021-05-18 | Disposition: A | Discharge status: Home / Self Care | Payer: Medicare Other | Source: Ambulatory Visit | Attending: Neurology | Admitting: Neurology

## 2021-05-18 DIAGNOSIS — I771 Stricture of artery: Secondary | ICD-10-CM | POA: Diagnosis not present

## 2021-05-18 DIAGNOSIS — I776 Arteritis, unspecified: Secondary | ICD-10-CM

## 2021-05-18 DIAGNOSIS — I672 Cerebral atherosclerosis: Secondary | ICD-10-CM | POA: Diagnosis not present

## 2021-05-18 DIAGNOSIS — R42 Dizziness and giddiness: Secondary | ICD-10-CM | POA: Diagnosis not present

## 2021-05-18 LAB — CYTOLOGY - NON PAP

## 2021-05-18 MED ORDER — IOPAMIDOL (ISOVUE-370) INJECTION 76%
75.0000 mL | Freq: Once | INTRAVENOUS | Status: AC | PRN
  Administered 2021-05-18: 75 mL via INTRAVENOUS

## 2021-05-21 ENCOUNTER — Ambulatory Visit (INDEPENDENT_AMBULATORY_CARE_PROVIDER_SITE_OTHER): Payer: Medicare Other | Admitting: Family Medicine

## 2021-05-21 ENCOUNTER — Encounter: Payer: Self-pay | Admitting: Family Medicine

## 2021-05-21 VITALS — BP 120/71 | HR 80 | Temp 97.9°F | Resp 16 | Wt 149.0 lb

## 2021-05-21 DIAGNOSIS — M51369 Other intervertebral disc degeneration, lumbar region without mention of lumbar back pain or lower extremity pain: Secondary | ICD-10-CM | POA: Insufficient documentation

## 2021-05-21 DIAGNOSIS — Z01818 Encounter for other preprocedural examination: Secondary | ICD-10-CM | POA: Diagnosis not present

## 2021-05-21 DIAGNOSIS — M5136 Other intervertebral disc degeneration, lumbar region: Secondary | ICD-10-CM | POA: Insufficient documentation

## 2021-05-21 NOTE — Progress Notes (Signed)
Subjective:    Becky Boyd is a 65 y.o. female who presents to the office today for a preoperative consultation at the request of surgeon Dr Tonita Cong who plans on performing R total knee on  TBD     . This consultation is requested for the specific conditions prompting preoperative evaluation (i.e. because of potential affect on operative risk): hypothyroid, hyperlipidemia. Planned anesthesia: general/spinal. The patient has the following known anesthesia issues:  no complications or issues . Patients bleeding risk: no recent abnormal bleeding, no remote history of abnormal bleeding, and no use of Ca-channel blockers. Patient does not have objections to receiving blood products if needed.  The following portions of the patient's history were reviewed and updated as appropriate: allergies, current medications, past family history, past medical history, past social history, past surgical history, and problem list.  Review of Systems Negative w/ exception of chronic cough   Objective:    BP 120/71    Pulse 80    Temp 97.9 F (36.6 C)    Resp 16    Wt 149 lb (67.6 kg)    SpO2 93%    BMI 26.39 kg/m   General Appearance:    Alert, cooperative, no distress, appears stated age  Head:    Normocephalic, without obvious abnormality, atraumatic  Eyes:    PERRL, conjunctiva/corneas clear, EOM's intact, fundi    benign, both eyes  Ears:    Normal TM's and external ear canals, both ears  Nose:   Nares normal, septum midline, mucosa normal, no drainage    or sinus tenderness  Throat:   Lips, mucosa, and tongue normal; teeth and gums normal  Neck:   Supple, symmetrical, trachea midline, no adenopathy;    thyroid:  no enlargement/tenderness/nodules;  Back:     Symmetric, no curvature, ROM normal, no CVA tenderness  Lungs:     Clear to auscultation bilaterally, respirations unlabored  Chest Wall:    No tenderness or deformity   Heart:    Regular rate and rhythm, S1 and S2 normal, no murmur, rub   or gallop   Breast Exam:    Deferred  Abdomen:     Soft, non-tender, bowel sounds active all four quadrants,    no masses, no organomegaly  Genitalia:    Deferred  Rectal:    Extremities:   Extremities normal, atraumatic, no cyanosis or edema  Pulses:   2+ and symmetric all extremities  Skin:   Skin color, texture, turgor normal, no rashes or lesions  Lymph nodes:   Cervical, supraclavicular, and axillary nodes normal  Neurologic:   CNII-XII intact, normal strength, sensation and reflexes    throughout    Predictors of intubation difficulty:  Morbid obesity? no  Anatomically abnormal facies? no  Prominent incisors? no  Receding mandible? no  Short, thick neck? no  Neck range of motion: normal  Dentition: No chipped, loose, or missing teeth.  Cardiographics ECG: no change since previous ECG dated Sept 2021 Echocardiogram: not done  Imaging Chest x-ray:  NA    Lab Review  Hospital Outpatient Visit on 05/17/2021  Component Date Value   CYTOLOGY - NON GYN 05/17/2021                     Value:CYTOLOGY - NON PAP CASE: [id number] PATIENT: Becky Boyd Non-Gynecological Cytology Report     Clinical History: Confluent parieto-occipital white matter abnormalities present on magnetic resonance imaging Arteritis CNS vasculitis White matter abnormality on MRI of brain Specimen Submitted:  A. CEREBRAL SPINAL FLUID, SPINAL TAP:   FINAL MICROSCOPIC DIAGNOSIS: - No malignant cells identified  SPECIMEN ADEQUACY: Satisfactory for evaluation  DIAGNOSTIC COMMENTS: Scant normal lymphocytes are present.  GROSS: Received is/are 4cc's of clear, colorless fluid. (MW:mw) Smears: 0 Concentration Method (Thin Prep): 1 Cell Block: 0 Additional Studies: N/A     Final Diagnosis performed by Unknown Jim, MD.   Electronically signed 05/18/2021 Technical and / or Professional components performed at [employer]. [facility], [place] [address] [place], [place] [address].   Immunohistochemistry Technical component (if applicable) was performed at [facility]. [address] [place], [place], [place] [address].   IMMUNOHISTOCHEMISTRY DISCLAIMER (if applicable): Some of these immunohistochemical stains may have been developed and the performance characteristics determine by [facility]. Some may not have been cleared or approved by the U.S. Food and Drug Administration. The FDA has determined that such clearance or approval is not necessary. This test is used for clinical purposes. It should not be regarded as investigational or for research. This laboratory is certified under the [employer] (CLIA-88) as qualified to perform high complexity clinical laboratory testing.  The controls stained appropriately.    Color, CSF 05/17/2021 COLORLESS    Appearance, CSF 05/17/2021 CLEAR    RBC Count, CSF 05/17/2021 251 (H)    WBC, CSF 05/17/2021 18 (H)    Segmented Neutrophils-CSF 05/17/2021 6    Lymphs, CSF 05/17/2021 86 (H)    Monocyte/Macrophage 05/17/2021 8 (L)    Eosinophils, CSF 05/17/2021 0    Basophils, % 05/17/2021 0    MICRO NUMBER: 05/17/2021 [id number]    SPECIMEN QUALITY: 05/17/2021 Adequate    Source 05/17/2021 NOT GIVEN    STATUS: 05/17/2021 FINAL    GRAM STAIN: 05/17/2021 No organisms or white blood cells seen    Result: 05/17/2021 No Growth    Total Protein, CSF 05/17/2021 49    Glucose, CSF 05/17/2021 62    IgG (Immunoglobin G), Se* 05/17/2021 1,240    Albumin Serum 05/17/2021 4.5   Office Visit on 05/03/2021  Component Date Value   Sed Rate 05/03/2021 26    CRP 05/03/2021 4    Anti-MPO Antibodies 05/03/2021 <0.2    Anti-PR3 Antibodies 05/03/2021 <0.2    C-ANCA 05/03/2021 <1:20    P-ANCA 05/03/2021 <1:20    Atypical pANCA 05/03/2021 <1:20    Cryoglobulin, Ql, Serum,* 05/03/2021 Comment    ANA Titer 1 05/03/2021 Negative    Rhuematoid fact  SerPl-aC* 05/03/2021 <10.0    dsDNA Ab 05/03/2021 3    ENA RNP Ab 05/03/2021 <0.2    ENA SM Ab Ser-aCnc 05/03/2021 <0.2    Scleroderma (Scl-70) (EN* 05/03/2021 <0.2    ENA SSA (RO) Ab 05/03/2021 >8.0 (H)    ENA SSB (LA) Ab 05/03/2021 <0.2    Chromatin Ab SerPl-aCnc 05/03/2021 0.8    Anti JO-1 05/03/2021 <0.2    Centromere Ab Screen 05/03/2021 <0.2    See below: 05/03/2021 Comment    Complement C1Q 05/03/2021 15.8    Compl, Total (CH50) 05/03/2021 >60    Complement C3, Serum 05/03/2021 134    Complement C4, Serum 05/03/2021 26    Hgb A1c MFr Bld 05/03/2021 5.0    Est. average glucose Bld* 05/03/2021 97    Vitamin B-12 05/03/2021 491    Folate 05/03/2021 5.9  Methylmalonic Acid 05/03/2021 223    Thiamine 05/03/2021 143.5    IgG (Immunoglobin G), Se* 05/03/2021 1,293    IgA/Immunoglobulin A, Se* 05/03/2021 125    IgM (Immunoglobulin M), * 05/03/2021 107    Albumin SerPl Elph-Mcnc 05/03/2021 4.2    Alpha 1 05/03/2021 0.3    Alpha2 Glob SerPl Elph-M* 05/03/2021 0.8    B-Globulin SerPl Elph-Mc* 05/03/2021 1.0    Gamma Glob SerPl Elph-Mc* 05/03/2021 1.4    M Protein SerPl Elph-Mcnc 05/03/2021 Not Observed    Globulin, Total 05/03/2021 3.5    Albumin/Glob SerPl 05/03/2021 1.3    IFE 1 05/03/2021 Comment    Please Note 05/03/2021 Comment    TSH 05/03/2021 2.120    HIV Screen 4th Generatio* 05/03/2021 Non Reactive    Hep C Virus Ab 05/03/2021 <0.1    Lead, Blood 05/03/2021 <1.0    Arsenic 05/03/2021 <1    Mercury 05/03/2021 <1.0    Vitamin B6 05/03/2021 13.1    WBC 05/03/2021 9.0    RBC 05/03/2021 3.99    Hemoglobin 05/03/2021 12.0    Hematocrit 05/03/2021 36.3    MCV 05/03/2021 91    MCH 05/03/2021 30.1    MCHC 05/03/2021 33.1    RDW 05/03/2021 13.1    Platelets 05/03/2021 261    Neutrophils 05/03/2021 73    Lymphs 05/03/2021 16    Monocytes 05/03/2021 6    Eos 05/03/2021 3    Basos 05/03/2021 1    Neutrophils Absolute 05/03/2021 6.7    Lymphocytes Absolute 05/03/2021  1.4    Monocytes Absolute 05/03/2021 0.5    EOS (ABSOLUTE) 05/03/2021 0.2    Basophils Absolute 05/03/2021 0.1    Immature Granulocytes 05/03/2021 1    Immature Grans (Abs) 05/03/2021 0.1    Glucose 05/03/2021 93    BUN 05/03/2021 13    Creatinine, Ser 05/03/2021 0.91    eGFR 05/03/2021 69    BUN/Creatinine Ratio 05/03/2021 14    Sodium 05/03/2021 141    Potassium 05/03/2021 4.4    Chloride 05/03/2021 103    CO2 05/03/2021 25    Calcium 05/03/2021 10.1    Total Protein 05/03/2021 7.7    Albumin 05/03/2021 5.0 (H)    Globulin, Total 05/03/2021 2.7    Albumin/Globulin Ratio 05/03/2021 1.9    Bilirubin Total 05/03/2021 0.3    Alkaline Phosphatase 05/03/2021 131 (H)    AST 05/03/2021 24    ALT 05/03/2021 15       Assessment:      65 y.o. female with planned surgery as above.   Known risk factors for perioperative complications: None   Difficulty with intubation is not anticipated.  Cardiac Risk Estimation: low  Current medications which may produce withdrawal symptoms if withheld perioperatively: none    Plan:    1. Preoperative workup as follows none. 2. Change in medication regimen before surgery: discontinue ASA 14 days before surgery. 3. Prophylaxis for cardiac events with perioperative beta-blockers: not indicated. 4. Invasive hemodynamic monitoring perioperatively: at the discretion of anesthesiologist. 5. Deep vein thrombosis prophylaxis postoperatively:regimen to be chosen by surgical team. 6. Surveillance for postoperative MI with ECG immediately postoperatively and on postoperative days 1 and 2 AND troponin levels 24 hours postoperatively and on day 4 or hospital discharge (whichever comes first): at the discretion of anesthesiologist. 7. Other measures:  NA

## 2021-05-21 NOTE — Patient Instructions (Signed)
Follow up as needed or as scheduled I'll send your paper work and a copy of today's note over to [facility] daily Claritin or Zyrtec to help w/ your cough Call with any questions or concerns Stay Safe!  Stay Healthy! Happy Holidays!!

## 2021-05-22 ENCOUNTER — Telehealth: Payer: Self-pay | Admitting: *Deleted

## 2021-05-22 NOTE — Telephone Encounter (Signed)
The patient returned my call.  We discussed the message below by Dr. Lucia Gaskins which discusses the result of the CT angiogram and cerebral spinal fluid.  Discussed that Dr. Lucia Gaskins would like to send the patient for a better test called a cerebral angiogram with Dr. Corliss Skains to see if there is any large vessel vasculitis.  If that is normal Dr. Lucia Gaskins would discuss treatment with her for a small vessel vasculitis and then recheck her lumbar fluid. The patient's questions were answered and she said she wants to hold off on the referral to Dr. Corliss Skains for now but she will call back if she changes her mind. She would like to know what Dr. Lucia Gaskins thinks about her not going forward with the cerebral angiogram and repeat LP and would like to know if she doesn't go through with these, would Dr. Lucia Gaskins still see her again. She also asked if Dr Lucia Gaskins still wanted her to see an MS specialist at [facility]. She stated if there is anything Dr Lucia Gaskins can do about the white spots in her brain then she is "all about that" however she is not crazy about the additional tests that have been recommended at this time.

## 2021-05-22 NOTE — Telephone Encounter (Signed)
-----   Message from Anson Fret, MD sent at 05/21/2021  9:23 AM EST ----- Her scans were normal so the larger blood vessels look fine. But she has elevated white blood cells in her cerebral spinal fluid(csf) despite every other csf test being normal. Despite a normal CT of the larger blood vessels, I would like to send her for a better test called a Cerebral Angiogram with Dr. Corliss Skains to see if we have any large-vessel vasculitis. If that is normal I am going to discuss treatment with her anyway for small-vessel vasculitis and then rechecking her lumbar fluid. Please discuss and let me know and I can order the test.

## 2021-05-22 NOTE — Telephone Encounter (Signed)
I called the pt and LVM (ok per DPR) asking for call back at her earliest convenience to discuss her test results and next steps. Left office number in message.

## 2021-05-22 NOTE — Addendum Note (Signed)
Addended by: Naomie Dean B on: 05/22/2021 09:29 PM   Modules accepted: Orders

## 2021-05-23 ENCOUNTER — Encounter: Payer: Self-pay | Admitting: Neurology

## 2021-05-23 NOTE — Telephone Encounter (Signed)
I called the pt and LVM with Dr Trevor Mace recommendation to have a second opinion at [facility] as she understands pt is hesitant about the additional testing. However Dr Lucia Gaskins cannot treat the white spots if she doesn't know the diagnosis and if patient will not do testing. I let the pt know that a referral was sent over a week ago and [facility] should be calling her to schedule, however she is welcome to call them. I provided the number for [facility] which was documented in a separate mychart encounter.

## 2021-05-31 ENCOUNTER — Other Ambulatory Visit: Payer: Self-pay | Admitting: Family Medicine

## 2021-05-31 DIAGNOSIS — R053 Chronic cough: Secondary | ICD-10-CM

## 2021-06-05 ENCOUNTER — Other Ambulatory Visit: Payer: Self-pay | Admitting: Family Medicine

## 2021-06-05 DIAGNOSIS — R053 Chronic cough: Secondary | ICD-10-CM

## 2021-06-07 ENCOUNTER — Other Ambulatory Visit: Payer: Medicare Other

## 2021-06-07 ENCOUNTER — Inpatient Hospital Stay: Admission: RE | Admit: 2021-06-07 | Payer: Medicare Other | Source: Ambulatory Visit

## 2021-06-07 DIAGNOSIS — I776 Arteritis, unspecified: Secondary | ICD-10-CM

## 2021-06-11 ENCOUNTER — Other Ambulatory Visit: Payer: Self-pay | Admitting: Family Medicine

## 2021-06-13 DIAGNOSIS — Z79899 Other long term (current) drug therapy: Secondary | ICD-10-CM | POA: Diagnosis not present

## 2021-06-13 DIAGNOSIS — M722 Plantar fascial fibromatosis: Secondary | ICD-10-CM | POA: Diagnosis not present

## 2021-06-13 DIAGNOSIS — M359 Systemic involvement of connective tissue, unspecified: Secondary | ICD-10-CM | POA: Diagnosis not present

## 2021-06-13 DIAGNOSIS — M0579 Rheumatoid arthritis with rheumatoid factor of multiple sites without organ or systems involvement: Secondary | ICD-10-CM | POA: Diagnosis not present

## 2021-06-13 DIAGNOSIS — M797 Fibromyalgia: Secondary | ICD-10-CM | POA: Diagnosis not present

## 2021-06-13 DIAGNOSIS — M21372 Foot drop, left foot: Secondary | ICD-10-CM | POA: Diagnosis not present

## 2021-06-13 DIAGNOSIS — M25511 Pain in right shoulder: Secondary | ICD-10-CM | POA: Diagnosis not present

## 2021-06-13 DIAGNOSIS — M5136 Other intervertebral disc degeneration, lumbar region: Secondary | ICD-10-CM | POA: Diagnosis not present

## 2021-06-14 ENCOUNTER — Other Ambulatory Visit: Payer: Self-pay | Admitting: Family Medicine

## 2021-06-15 LAB — CSF CULTURE W GRAM STAIN
GRAM STAIN:: NONE SEEN
MICRO NUMBER:: 12761893
Result:: NO GROWTH
SPECIMEN QUALITY:: ADEQUATE

## 2021-06-15 LAB — CSF CELL COUNT WITH DIFFERENTIAL
Basophils, %: 0 %
Eosinophils, CSF: 0 %
Lymphs, CSF: 86 % — ABNORMAL HIGH (ref 40–80)
Monocyte/Macrophage: 8 % — ABNORMAL LOW (ref 15–45)
RBC Count, CSF: 251 cells/uL — ABNORMAL HIGH
Segmented Neutrophils-CSF: 6 % (ref 0–6)
WBC, CSF: 18 cells/uL — ABNORMAL HIGH (ref 0–5)

## 2021-06-15 LAB — PROTEIN, CSF: Total Protein, CSF: 49 mg/dL (ref 15–60)

## 2021-06-15 LAB — FUNGUS CULTURE W SMEAR
CULTURE:: NO GROWTH
MICRO NUMBER:: 12761892
SMEAR:: NONE SEEN
SPECIMEN QUALITY:: ADEQUATE

## 2021-06-15 LAB — CNS IGG SYNTHESIS RATE, CSF+BLOOD
Albumin Serum: 4.5 g/dL (ref 3.2–4.6)
Albumin, CSF: 30 mg/dL (ref 8.0–42.0)
CNS-IgG Synthesis Rate: -2.5 mg/24 h (ref <=3.3)
IgG (Immunoglobin G), Serum: 1240 mg/dL (ref 600–1540)
IgG Total CSF: 4.1 mg/dL (ref 0.8–7.7)
IgG-Index: 0.5 (ref <0.66)

## 2021-06-15 LAB — VDRL, CSF: VDRL Quant, CSF: NONREACTIVE

## 2021-06-15 LAB — OLIGOCLONAL BANDS, CSF + SERM

## 2021-06-15 LAB — GLUCOSE, CSF: Glucose, CSF: 62 mg/dL (ref 40–80)

## 2021-06-17 ENCOUNTER — Telehealth: Payer: Self-pay | Admitting: Neurology

## 2021-06-17 NOTE — Telephone Encounter (Signed)
[facility] changes in the brain with increased WBCs in her lumbar puncture. She wanted to see an academic center when I suggested more testing for vasculitis. Please remind patient and if she hasn't heard from [facility] then give her their number to call please (fyi Dr. Gardiner Fanti.

## 2021-06-18 ENCOUNTER — Telehealth: Payer: Self-pay | Admitting: *Deleted

## 2021-06-18 NOTE — Telephone Encounter (Signed)
Spoke to pt and relayed that Oligoclonal bands are not increased in the spinal fluid, this is negative for MS. She verbalized understanding.  She does have appt at [facility] 07-31-2021 scheduled.

## 2021-06-18 NOTE — Telephone Encounter (Addendum)
-----   Message from Anson Fret, MD sent at 06/17/2021  9:33 AM EST ----- [facility] her changes in the brain with increased WBCs. She wanted to see an academic center when I suggested more testing. Please remind patient and if she hasn;t heard from them give her the number to call please thanks.  Oligoclonal bands are not increase in the spinal fluid, this is negative for MS thanks  Written by Anson Fret, MD on 06/17/2021  9:36 AM EST

## 2021-06-18 NOTE — Telephone Encounter (Signed)
Called pt & LVM asking for call back. Left office number in message.  °

## 2021-06-18 NOTE — Telephone Encounter (Signed)
(   I see this is from careeverywhere and I see that appt with [facility].  Telephone Encounter - Becky Boyd - 06/01/2021 7:46 AM EST Formatting of this note might be different from the original. Scheduled 07/31/21. LMVM for pt that saw that has appt.  I do not have time.  Just wanted to confirm.  She can mychart as well.

## 2021-06-24 ENCOUNTER — Other Ambulatory Visit: Payer: Self-pay | Admitting: Family Medicine

## 2021-06-27 ENCOUNTER — Ambulatory Visit: Payer: Self-pay | Admitting: Orthopedic Surgery

## 2021-06-27 DIAGNOSIS — M1711 Unilateral primary osteoarthritis, right knee: Secondary | ICD-10-CM

## 2021-07-03 ENCOUNTER — Other Ambulatory Visit: Payer: Self-pay | Admitting: Family Medicine

## 2021-07-03 DIAGNOSIS — E785 Hyperlipidemia, unspecified: Secondary | ICD-10-CM

## 2021-07-16 ENCOUNTER — Other Ambulatory Visit: Payer: Self-pay | Admitting: Family Medicine

## 2021-07-31 DIAGNOSIS — R9082 White matter disease, unspecified: Secondary | ICD-10-CM | POA: Diagnosis not present

## 2021-07-31 DIAGNOSIS — G3184 Mild cognitive impairment, so stated: Secondary | ICD-10-CM | POA: Diagnosis not present

## 2021-08-03 ENCOUNTER — Telehealth: Payer: Self-pay | Admitting: Neurology

## 2021-08-03 ENCOUNTER — Encounter: Payer: Self-pay | Admitting: Family Medicine

## 2021-08-03 NOTE — Telephone Encounter (Signed)
Pt asking if copy of MRI has been sent to Dr. Gaynelle Adu.  Requesting a copy of the scan to look at white spots on the brain. Would like a call back to confirm has been sent. ?

## 2021-08-06 ENCOUNTER — Encounter: Payer: Self-pay | Admitting: Family Medicine

## 2021-08-06 NOTE — Telephone Encounter (Signed)
I called the pt and LVM (ok per DPR), also sent mychart message to pt, advising that Dr. Virgil Benedict office ordered the MRI brain.  She would need to contact their office for the records to be released by them. Also included Dr Virgil Benedict office number.  ?

## 2021-08-08 ENCOUNTER — Ambulatory Visit: Admit: 2021-08-08 | Payer: Medicare Other | Admitting: Specialist

## 2021-08-08 SURGERY — ARTHROPLASTY, KNEE, TOTAL
Anesthesia: General | Site: Knee | Laterality: Right

## 2021-08-21 ENCOUNTER — Encounter: Payer: Self-pay | Admitting: Neurology

## 2021-09-01 ENCOUNTER — Other Ambulatory Visit: Payer: Self-pay | Admitting: Family Medicine

## 2021-09-01 DIAGNOSIS — R053 Chronic cough: Secondary | ICD-10-CM

## 2021-09-04 DIAGNOSIS — Z1211 Encounter for screening for malignant neoplasm of colon: Secondary | ICD-10-CM | POA: Diagnosis not present

## 2021-09-11 NOTE — Telephone Encounter (Signed)
I called the patient and LVM with our office number asking for call back.  ? ?----------------------- ?Becky Boyd, Antonia B, MD  P [facility] Calls ?I responded to patient but she never read her chart.  Can you please give her my message below, [facility] her imaging (see below) and recommended formal memory testing and EEG but did not think she had MS.  And let me know if she would like us to order repeat neuropsychiatric (memory) testing and an EEG to screen for epileptic activity as [facility].  The good news is that they did not think she had MS.  Thanks  ?

## 2021-09-12 ENCOUNTER — Telehealth: Payer: Self-pay | Admitting: Neurology

## 2021-09-12 LAB — COLOGUARD: COLOGUARD: NEGATIVE

## 2021-09-12 NOTE — Telephone Encounter (Signed)
Pt has called back in response to message from [place], [place] on yesterday.  Please call ?

## 2021-09-13 NOTE — Telephone Encounter (Signed)
Spoke with pt. This is documented in the previous mychart encounter.  ?

## 2021-09-13 NOTE — Telephone Encounter (Signed)
Called patient and LVM (ok per DPR) with detailed message as noted below by Dr. Lucia Gaskins.  The patient called me back shortly after and we discussed the message.  The patient stated that Dr. Macarthur Critchley had actually called her back the day after her appointment and the patient asked her if the formal memory testing showed that her memory was worse was there anything Dr. Macarthur Critchley could do to help and the pt stated the doctor told her "no, you would just know".  The patient states that she already knows her memory is worse. The patient also said they discussed the EEG and she does not have any history of seizures nor has she ever even had a "5-second drift off".  She states she asked the doctor if this were to be found on EEG would we do anything and she states the doctor told her "only if you had seizures" so the patient has decided to decline both of these tests at this time.  I told her a message would be sent to Dr Lucia Gaskins as Lorain Childes when she is back in the office. She verbalized appreciation for the call. ?

## 2021-09-19 ENCOUNTER — Other Ambulatory Visit: Payer: Self-pay | Admitting: Family Medicine

## 2021-10-01 ENCOUNTER — Other Ambulatory Visit: Payer: Self-pay | Admitting: Family Medicine

## 2021-10-03 ENCOUNTER — Encounter: Payer: Self-pay | Admitting: Family Medicine

## 2021-10-03 DIAGNOSIS — Z79899 Other long term (current) drug therapy: Secondary | ICD-10-CM | POA: Diagnosis not present

## 2021-10-03 DIAGNOSIS — M0579 Rheumatoid arthritis with rheumatoid factor of multiple sites without organ or systems involvement: Secondary | ICD-10-CM | POA: Diagnosis not present

## 2021-10-03 DIAGNOSIS — M797 Fibromyalgia: Secondary | ICD-10-CM | POA: Diagnosis not present

## 2021-10-03 DIAGNOSIS — M359 Systemic involvement of connective tissue, unspecified: Secondary | ICD-10-CM | POA: Diagnosis not present

## 2021-10-03 DIAGNOSIS — M21372 Foot drop, left foot: Secondary | ICD-10-CM | POA: Diagnosis not present

## 2021-10-03 DIAGNOSIS — M25511 Pain in right shoulder: Secondary | ICD-10-CM | POA: Diagnosis not present

## 2021-10-03 DIAGNOSIS — M722 Plantar fascial fibromatosis: Secondary | ICD-10-CM | POA: Diagnosis not present

## 2021-10-03 DIAGNOSIS — M5136 Other intervertebral disc degeneration, lumbar region: Secondary | ICD-10-CM | POA: Diagnosis not present

## 2021-10-04 ENCOUNTER — Other Ambulatory Visit: Payer: Self-pay

## 2021-10-04 MED ORDER — BACLOFEN 10 MG PO TABS: 10.0000 mg | ORAL_TABLET | Freq: Three times a day (TID) | ORAL | 0 refills | Status: DC

## 2021-11-13 ENCOUNTER — Telehealth: Payer: Self-pay

## 2021-11-13 ENCOUNTER — Other Ambulatory Visit: Payer: Self-pay | Admitting: Family Medicine

## 2021-11-13 NOTE — Telephone Encounter (Signed)
Called pt to schedule AWV with [facility], no answer, LM to call back and schedule this appointment

## 2021-11-14 MED ORDER — BACLOFEN 10 MG PO TABS: 10.0000 mg | ORAL_TABLET | Freq: Three times a day (TID) | ORAL | 0 refills | Status: DC

## 2021-11-14 NOTE — Telephone Encounter (Signed)
Patient is requesting a refill of the following medications: Requested Prescriptions   Pending Prescriptions Disp Refills   baclofen (LIORESAL) 10 MG tablet 30 tablet 0    Sig: Take 1 tablet (10 mg total) by mouth 3 (three) times daily.    Date of patient request: 11/14/21 Last office visit: 05/21/21 Date of last refill: 30 Last refill amount: 10/04/21

## 2021-11-16 NOTE — Telephone Encounter (Signed)
Attempted schedule for AWV, no answer

## 2021-11-19 NOTE — Telephone Encounter (Signed)
Left message for patient to call back and schedule Medicare Annual Wellness Visit (AWV).   Please offer to do virtually or by telephone.  Left office number and my jabber [phone].  Last AWV:10/08/2017  Please schedule at anytime with Nurse Health Advisor.

## 2021-12-02 ENCOUNTER — Other Ambulatory Visit: Payer: Self-pay | Admitting: Family Medicine

## 2021-12-02 DIAGNOSIS — R053 Chronic cough: Secondary | ICD-10-CM

## 2021-12-15 ENCOUNTER — Other Ambulatory Visit: Payer: Self-pay | Admitting: Family Medicine

## 2021-12-19 ENCOUNTER — Other Ambulatory Visit: Payer: Self-pay | Admitting: Family Medicine

## 2021-12-19 MED ORDER — BACLOFEN 10 MG PO TABS: 10.0000 mg | ORAL_TABLET | Freq: Three times a day (TID) | ORAL | 0 refills | Status: DC

## 2021-12-25 ENCOUNTER — Other Ambulatory Visit: Payer: Self-pay | Admitting: Family Medicine

## 2021-12-25 DIAGNOSIS — E785 Hyperlipidemia, unspecified: Secondary | ICD-10-CM

## 2021-12-27 ENCOUNTER — Other Ambulatory Visit: Payer: Self-pay | Admitting: Family Medicine

## 2021-12-31 ENCOUNTER — Other Ambulatory Visit: Payer: Self-pay | Admitting: Family Medicine

## 2022-01-03 ENCOUNTER — Telehealth: Payer: Self-pay

## 2022-01-03 NOTE — Telephone Encounter (Signed)
No answer when called x3 for scheduled AWV.  Phone answering went straight to voicemail. Left message to reschedule.

## 2022-01-16 ENCOUNTER — Ambulatory Visit: Payer: Medicare Other | Admitting: Neurology

## 2022-01-17 ENCOUNTER — Ambulatory Visit (INDEPENDENT_AMBULATORY_CARE_PROVIDER_SITE_OTHER): Payer: Medicare Other

## 2022-01-17 DIAGNOSIS — Z780 Asymptomatic menopausal state: Secondary | ICD-10-CM

## 2022-01-17 DIAGNOSIS — Z0000 Encounter for general adult medical examination without abnormal findings: Secondary | ICD-10-CM

## 2022-01-17 NOTE — Progress Notes (Signed)
Subjective:   Becky Boyd is a 66 y.o. female who presents for Medicare Annual (Subsequent) preventive examination.   I connected with [name]  today by telephone and verified that I am speaking with the correct person using two identifiers. Location patient: home Location provider: work Persons participating in the virtual visit: patient, provider.   I discussed the limitations, risks, security and privacy concerns of performing an evaluation and management service by telephone and the availability of in person appointments. I also discussed with the patient that there may be a patient responsible charge related to this service. The patient expressed understanding and verbally consented to this telephonic visit.    Interactive audio and video telecommunications were attempted between this provider and patient, however failed, due to patient having technical difficulties OR patient did not have access to video capability.  We continued and completed visit with audio only.    Review of Systems     Cardiac Risk Factors include: advanced age (>43men, >70 women)     Objective:    Today's Vitals   There is no height or weight on file to calculate BMI.     01/17/2022   10:16 AM 06/11/2020   10:00 AM 06/10/2020    8:47 AM 06/09/2020   11:00 PM 06/06/2020    1:20 PM 09/17/2018   11:00 PM 09/17/2018    3:24 PM  Advanced Directives  Does Patient Have a Medical Advance Directive? Yes  Yes Yes Yes Yes Yes  Type of Estate agent of [place];Living will Healthcare Power of [place];Living will  Healthcare Power of [place];Living will Healthcare Power of [place];Living will Healthcare Power of [employer] Power of Attorney  Does patient want to make changes to medical advance directive?  No - Patient declined    No - Patient declined   Copy of Healthcare Power of Attorney in Chart? No - copy requested     No - copy requested No - copy requested    Current  Medications (verified) Outpatient Encounter Medications as of 01/17/2022  Medication Sig   aspirin (ASPIRIN 81) 81 MG chewable tablet Chew by mouth daily.   atorvastatin (LIPITOR) 20 MG tablet TAKE ONE TABLET BY MOUTH ONE TIME DAILY   baclofen (LIORESAL) 10 MG tablet TAKE ONE TABLET BY MOUTH THREE TIMES DAILY   Carboxymethylcellul-Glycerin (LUBRICATING EYE DROPS OP) Place 1 drop into both eyes daily as needed (dry eyes).   DULoxetine (CYMBALTA) 60 MG capsule Take 60 mg by mouth at bedtime.   HYDROcodone-acetaminophen (NORCO/VICODIN) 5-325 MG tablet Take 1 tablet by mouth every 6 (six) hours as needed for moderate pain.   Iron-Vitamin C 65-125 MG TABS Take by mouth.   levothyroxine (SYNTHROID) 88 MCG tablet TAKE ONE TABLET BY MOUTH ONE TIME DAILY   meloxicam (MOBIC) 7.5 MG tablet Take 7.5 mg by mouth daily.   pantoprazole (PROTONIX) 40 MG tablet TAKE ONE TABLET BY MOUTH ONE TIME DAILY   predniSONE (DELTASONE) 5 MG tablet Take 5 mg by mouth daily.   sulfaSALAzine (AZULFIDINE) 500 MG tablet Take 1,000 mg by mouth 2 (two) times daily.   Turmeric (QC TUMERIC COMPLEX) 500 MG CAPS Take 1,000 mg by mouth in the morning and at bedtime.   No facility-administered encounter medications on file as of 01/17/2022.    Allergies (verified) Plaquenil [hydroxychloroquine sulfate]   History: Past Medical History:  Diagnosis Date   Aneurysm (HCC)    Anorexia    COVID    Dysphagia  Fibromyalgia    Fibromyalgia    GERD (gastroesophageal reflux disease)    Hashimoto thyroiditis    History of kidney stones    hx of multiple kidney stones    Hypothyroidism    Kidney stones    Lupus (HCC)    Palpitations    Rheumatoid arthritis (HCC)    Sjogren's disease (HCC)    Past Surgical History:  Procedure Laterality Date   ABDOMINAL HYSTERECTOMY     CERVICAL FUSION  2004   C4-C5 with Instrumentation   CERVICAL FUSION  1990   C3-C4   cervical rods     CHOLECYSTECTOMY     kidney stone     l5-s1  surgery     LUMBAR MICRODISCECTOMY  2004   Bilateral L5-S1   TOTAL KNEE ARTHROPLASTY Left 06/09/2020   Procedure: TOTAL KNEE ARTHROPLASTY;  Surgeon: Jene Every, MD;  Location: [facility];  Service: Orthopedics;  Laterality: Left;   Family History  Problem Relation Age of Onset   Diabetes Sister    Breast cancer Paternal Aunt    COPD Mother    Heart disease Father    COPD Brother    Aneurysm Paternal Aunt    Diabetes Other    Social History   Socioeconomic History   Marital status: Married    Spouse name: Not on file   Number of children: 2   Years of education: Not on file   Highest education level: High school graduate  Occupational History   Not on file  Tobacco Use   Smoking status: Never   Smokeless tobacco: Never  Vaping Use   Vaping Use: Never used  Substance and Sexual Activity   Alcohol use: Never   Drug use: Never   Sexual activity: Not on file  Other Topics Concern   Not on file  Social History Narrative   Lives at home with her husband   Right handed   Caffeine: occasional pepsi or mtn dew   Social Determinants of Health   Financial Resource Strain: Low Risk  (01/17/2022)   Overall Financial Resource Strain (CARDIA)    Difficulty of Paying Living Expenses: Not hard at all  Food Insecurity: No Food Insecurity (01/17/2022)   Hunger Vital Sign    Worried About Running Out of Food in the Last Year: Never true    Ran Out of Food in the Last Year: Never true  Transportation Needs: No Transportation Needs (01/17/2022)   PRAPARE - Administrator, Civil Service (Medical): No    Lack of Transportation (Non-Medical): No  Physical Activity: Insufficiently Active (01/17/2022)   Exercise Vital Sign    Days of Exercise per Week: 3 days    Minutes of Exercise per Session: 30 min  Stress: No Stress Concern Present (01/17/2022)   [employer] of Occupational Health - Occupational Stress Questionnaire    Feeling of Stress : Not at all  Social  Connections: Moderately Integrated (01/17/2022)   Social Connection and Isolation Panel [NHANES]    Frequency of Communication with Friends and Family: Three times a week    Frequency of Social Gatherings with Friends and Family: Three times a week    Attends Religious Services: 1 to 4 times per year    Active Member of Clubs or Organizations: No    Attends Banker Meetings: Never    Marital Status: Married    Tobacco Counseling Counseling given: Not Answered   Clinical Intake:  Pre-visit preparation completed: Yes  Pain :  No/denies pain     Nutritional Risks: None Diabetes: No  How often do you need to have someone help you when you read instructions, pamphlets, or other written materials from your doctor or pharmacy?: 1 - Never What is the last grade level you completed in school?: [employer]  Diabetic?no   Interpreter Needed?: No  Information entered by :: L.Wilson,LPN   Activities of Daily Living    01/17/2022   10:26 AM  In your present state of health, do you have any difficulty performing the following activities:  Hearing? 0  Vision? 0  Difficulty concentrating or making decisions? 0  Walking or climbing stairs? 0  Dressing or bathing? 0  Doing errands, shopping? 0  Preparing Food and eating ? N  Using the Toilet? N  In the past six months, have you accidently leaked urine? N  Do you have problems with loss of bowel control? N  Managing your Medications? N  Managing your Finances? N  Housekeeping or managing your Housekeeping? N    Patient Care Team: Midge Minium, MD as PCP - General Amil Amen Irven Easterly, MD as Consulting Physician (Rheumatology) Melvenia Beam, MD as Consulting Physician (Neurology)  Indicate any recent Medical Services you may have received from other than [facility] providers in the past year (date may be approximate).     Assessment:   This is a routine wellness examination for [place].  Hearing/Vision  screen Vision Screening - Comments:: Annual eye exams wear glasses   Dietary issues and exercise activities discussed: Current Exercise Habits: Home exercise routine, Type of exercise: walking, Time (Minutes): 30, Frequency (Times/Week): 3, Weekly Exercise (Minutes/Week): 90, Intensity: Mild, Exercise limited by: orthopedic condition(s)   Goals Addressed   None    Depression Screen    01/17/2022   10:17 AM 01/17/2022   10:14 AM 05/21/2021   11:13 AM 02/19/2021   11:46 AM 07/13/2020    2:07 PM 02/17/2020    1:02 PM 02/15/2019    3:01 PM  PHQ 2/9 Scores  PHQ - 2 Score 0 0 0 0 0 0 0  PHQ- 9 Score   6 2 0 0     Fall Risk    01/17/2022   10:17 AM 05/21/2021   11:13 AM 02/19/2021   11:46 AM 07/13/2020    2:06 PM 02/17/2020    1:02 PM  Fall Risk   Falls in the past year? 0 1 0 0 0  Number falls in past yr: 0 0  0 0  Injury with Fall? 0 0  0 0  Risk for fall due to :  History of fall(s) No Fall Risks No Fall Risks   Follow up Falls evaluation completed;Education provided Falls evaluation completed Falls evaluation completed  Falls evaluation completed    [place]:  Any stairs in or around the home? Yes  If so, are there any without handrails? No  Home free of loose throw rugs in walkways, pet beds, electrical cords, etc? Yes  Adequate lighting in your home to reduce risk of falls? Yes   ASSISTIVE DEVICES UTILIZED TO PREVENT FALLS:  Life alert? No  Use of a cane, walker or w/c? No  Grab bars in the bathroom? No  Shower chair or bench in shower? Yes  Elevated toilet seat or a handicapped toilet? No     Cognitive Function:  Normal cognitive status assessed by telephone conversation  by this Nurse Health Advisor. No abnormalities found.  05/03/2021    4:19 PM 10/08/2017   12:52 PM  MMSE - Mini Mental State Exam  Orientation to time 5 4  Orientation to Place 5 5  Registration 3 3  Attention/ Calculation 4 5  Recall 2 3  Language-  name 2 objects 2 2  Language- repeat 1 1  Language- follow 3 step command 3 3  Language- read & follow direction 1 1  Write a sentence 1 1  Copy design 1 1  Total score 28 29        01/17/2022   10:27 AM  6CIT Screen  What Year? 0 points  What month? 0 points  What time? 0 points  Count back from 20 0 points  Months in reverse 0 points  Repeat phrase 0 points  Total Score 0 points    Immunizations Immunization History  Administered Date(s) Administered   Fluad Quad(high Dose 65+) 02/17/2020   Influenza, Seasonal, Injecte, Preservative Fre 07/04/2016   Influenza,inj,Quad PF,6+ Mos 04/22/2013, 04/01/2014, 01/27/2017, 03/04/2019   Influenza-Unspecified 03/31/2021   Moderna SARS-COV2 Booster Vaccination 09/21/2020, 03/31/2021   Moderna Sars-Covid-2 Vaccination 07/09/2019, 08/05/2019, 04/28/2020   Zoster Recombinat (Shingrix) 02/19/2021    TDAP status: Due, Education has been provided regarding the importance of this vaccine. Advised may receive this vaccine at local pharmacy or Health Dept. Aware to provide a copy of the vaccination record if obtained from local pharmacy or Health Dept. Verbalized acceptance and understanding.  Flu Vaccine status: Up to date  Pneumococcal vaccine status: Due, Education has been provided regarding the importance of this vaccine. Advised may receive this vaccine at local pharmacy or Health Dept. Aware to provide a copy of the vaccination record if obtained from local pharmacy or Health Dept. Verbalized acceptance and understanding.  Covid-19 vaccine status: Completed vaccines  Qualifies for Shingles Vaccine? Yes   Zostavax completed No   Shingrix Completed?: No.    Education has been provided regarding the importance of this vaccine. Patient has been advised to call insurance company to determine out of pocket expense if they have not yet received this vaccine. Advised may also receive vaccine at local pharmacy or Health Dept. Verbalized  acceptance and understanding.  Screening Tests Health Maintenance  Topic Date Due   MAMMOGRAM  Never done   TETANUS/TDAP  Never done   Pneumonia Vaccine 22+ Years old (1 - PCV) Never done   Zoster Vaccines- Shingrix (2 of 2) 04/16/2021   COVID-19 Vaccine (4 - Moderna risk series) 05/26/2021   INFLUENZA VACCINE  01/01/2022   Fecal DNA (Cologuard)  09/04/2024   DEXA SCAN  Completed   Hepatitis C Screening  Completed   HPV VACCINES  Aged Out   COLONOSCOPY (Pts 45-85yrs Insurance coverage will need to be confirmed)  Discontinued    Health Maintenance  Health Maintenance Due  Topic Date Due   MAMMOGRAM  Never done   TETANUS/TDAP  Never done   Pneumonia Vaccine 88+ Years old (1 - PCV) Never done   Zoster Vaccines- Shingrix (2 of 2) 04/16/2021   COVID-19 Vaccine (4 - Moderna risk series) 05/26/2021   INFLUENZA VACCINE  01/01/2022    Colorectal cancer screening: Type of screening: Cologuard. Completed 09/04/2021. Repeat every 3 years  Mammogram status: Ordered declined due breast implants . Pt provided with contact info and advised to call to schedule appt.   Bone Density status: Ordered 01/17/2022. Pt provided with contact info and advised to call to schedule appt.  Lung Cancer Screening: (Low Dose CT  Chest recommended if Age 67-80 years, 30 pack-year currently smoking OR have quit w/in 15years.) does not qualify.   Lung Cancer Screening Referral: n/a  Additional Screening:  Hepatitis C Screening: does not qualify;   Vision Screening: Recommended annual ophthalmology exams for early detection of glaucoma and other disorders of the eye. Is the patient up to date with their annual eye exam?  Yes  Who is the provider or what is the name of the office in which the patient attends annual eye exams? [facility]  If pt is not established with a provider, would they like to be referred to a provider to establish care? No .   Dental Screening: Recommended annual dental exams for proper  oral hygiene  Community Resource Referral / Chronic Care Management: CRR required this visit?  No   CCM required this visit?  No      Plan:     I have personally reviewed and noted the following in the patient's chart:   Medical and social history Use of alcohol, tobacco or illicit drugs  Current medications and supplements including opioid prescriptions.  Functional ability and status Nutritional status Physical activity Advanced directives List of other physicians Hospitalizations, surgeries, and ER visits in previous 12 months Vitals Screenings to include cognitive, depression, and falls Referrals and appointments  In addition, I have reviewed and discussed with patient certain preventive protocols, quality metrics, and best practice recommendations. A written personalized care plan for preventive services as well as general preventive health recommendations were provided to patient.     Daphane Shepherd, LPN   [id number]   Nurse Notes: none

## 2022-01-17 NOTE — Patient Instructions (Signed)
Becky Boyd , Thank you for taking time to come for your Medicare Wellness Visit. I appreciate your ongoing commitment to your health goals. Please review the following plan we discussed and let me know if I can assist you in the future.   Screening recommendations/referrals: Colonoscopy: Cologuard 09/04/2021   due 2026 Mammogram: declined due dende breast implants  Bone Density: referral 01/17/2022 Recommended yearly ophthalmology/optometry visit for glaucoma screening and checkup Recommended yearly dental visit for hygiene and checkup  Vaccinations: Influenza vaccine: completed  Pneumococcal vaccine: due  Tdap vaccine: due  Shingles vaccine: completed     Advanced directives: yes   Conditions/risks identified: none   Next appointment: none    Preventive Care 65 Years and Older, Female Preventive care refers to lifestyle choices and visits with your health care provider that can promote health and wellness. What does preventive care include? A yearly physical exam. This is also called an annual well check. Dental exams once or twice a year. Routine eye exams. Ask your health care provider how often you should have your eyes checked. Personal lifestyle choices, including: Daily care of your teeth and gums. Regular physical activity. Eating a healthy diet. Avoiding tobacco and drug use. Limiting alcohol use. Practicing safe sex. Taking low-dose aspirin every day. Taking vitamin and mineral supplements as recommended by your health care provider. What happens during an annual well check? The services and screenings done by your health care provider during your annual well check will depend on your age, overall health, lifestyle risk factors, and family history of disease. Counseling  Your health care provider may ask you questions about your: Alcohol use. Tobacco use. Drug use. Emotional well-being. Home and relationship well-being. Sexual activity. Eating habits. History  of falls. Memory and ability to understand (cognition). Work and work Astronomer. Reproductive health. Screening  You may have the following tests or measurements: Height, weight, and BMI. Blood pressure. Lipid and cholesterol levels. These may be checked every 5 years, or more frequently if you are over 17 years old. Skin check. Lung cancer screening. You may have this screening every year starting at age 49 if you have a 30-pack-year history of smoking and currently smoke or have quit within the past 15 years. Fecal occult blood test (FOBT) of the stool. You may have this test every year starting at age 43. Flexible sigmoidoscopy or colonoscopy. You may have a sigmoidoscopy every 5 years or a colonoscopy every 10 years starting at age 23. Hepatitis C blood test. Hepatitis B blood test. Sexually transmitted disease (STD) testing. Diabetes screening. This is done by checking your blood sugar (glucose) after you have not eaten for a while (fasting). You may have this done every 1-3 years. Bone density scan. This is done to screen for osteoporosis. You may have this done starting at age 86. Mammogram. This may be done every 1-2 years. Talk to your health care provider about how often you should have regular mammograms. Talk with your health care provider about your test results, treatment options, and if necessary, the need for more tests. Vaccines  Your health care provider may recommend certain vaccines, such as: Influenza vaccine. This is recommended every year. Tetanus, diphtheria, and acellular pertussis (Tdap, Td) vaccine. You may need a Td booster every 10 years. Zoster vaccine. You may need this after age 2. Pneumococcal 13-valent conjugate (PCV13) vaccine. One dose is recommended after age 51. Pneumococcal polysaccharide (PPSV23) vaccine. One dose is recommended after age 61. Talk to your health care  provider about which screenings and vaccines you need and how often you need  them. This information is not intended to replace advice given to you by your health care provider. Make sure you discuss any questions you have with your health care provider. Document Released: 06/16/2015 Document Revised: 02/07/2016 Document Reviewed: 03/21/2015 Elsevier Interactive Patient Education  2017 [employer] Prevention in the Home Falls can cause injuries. They can happen to people of all ages. There are many things you can do to make your home safe and to help prevent falls. What can I do on the outside of my home? Regularly fix the edges of walkways and driveways and fix any cracks. Remove anything that might make you trip as you walk through a door, such as a raised step or threshold. Trim any bushes or trees on the path to your home. Use bright outdoor lighting. Clear any walking paths of anything that might make someone trip, such as rocks or tools. Regularly check to see if handrails are loose or broken. Make sure that both sides of any steps have handrails. Any raised decks and porches should have guardrails on the edges. Have any leaves, snow, or ice cleared regularly. Use sand or salt on walking paths during winter. Clean up any spills in your garage right away. This includes oil or grease spills. What can I do in the bathroom? Use night lights. Install grab bars by the toilet and in the tub and shower. Do not use towel bars as grab bars. Use non-skid mats or decals in the tub or shower. If you need to sit down in the shower, use a plastic, non-slip stool. Keep the floor dry. Clean up any water that spills on the floor as soon as it happens. Remove soap buildup in the tub or shower regularly. Attach bath mats securely with double-sided non-slip rug tape. Do not have throw rugs and other things on the floor that can make you trip. What can I do in the bedroom? Use night lights. Make sure that you have a light by your bed that is easy to reach. Do not use  any sheets or blankets that are too big for your bed. They should not hang down onto the floor. Have a firm chair that has side arms. You can use this for support while you get dressed. Do not have throw rugs and other things on the floor that can make you trip. What can I do in the kitchen? Clean up any spills right away. Avoid walking on wet floors. Keep items that you use a lot in easy-to-reach places. If you need to reach something above you, use a strong step stool that has a grab bar. Keep electrical cords out of the way. Do not use floor polish or wax that makes floors slippery. If you must use wax, use non-skid floor wax. Do not have throw rugs and other things on the floor that can make you trip. What can I do with my stairs? Do not leave any items on the stairs. Make sure that there are handrails on both sides of the stairs and use them. Fix handrails that are broken or loose. Make sure that handrails are as long as the stairways. Check any carpeting to make sure that it is firmly attached to the stairs. Fix any carpet that is loose or worn. Avoid having throw rugs at the top or bottom of the stairs. If you do have throw rugs, attach them to the  floor with carpet tape. Make sure that you have a light switch at the top of the stairs and the bottom of the stairs. If you do not have them, ask someone to add them for you. What else can I do to help prevent falls? Wear shoes that: Do not have high heels. Have rubber bottoms. Are comfortable and fit you well. Are closed at the toe. Do not wear sandals. If you use a stepladder: Make sure that it is fully opened. Do not climb a closed stepladder. Make sure that both sides of the stepladder are locked into place. Ask someone to hold it for you, if possible. Clearly mark and make sure that you can see: Any grab bars or handrails. First and last steps. Where the edge of each step is. Use tools that help you move around (mobility aids)  if they are needed. These include: Canes. Walkers. Scooters. Crutches. Turn on the lights when you go into a dark area. Replace any light bulbs as soon as they burn out. Set up your furniture so you have a clear path. Avoid moving your furniture around. If any of your floors are uneven, fix them. If there are any pets around you, be aware of where they are. Review your medicines with your doctor. Some medicines can make you feel dizzy. This can increase your chance of falling. Ask your doctor what other things that you can do to help prevent falls. This information is not intended to replace advice given to you by your health care provider. Make sure you discuss any questions you have with your health care provider. Document Released: 03/16/2009 Document Revised: 10/26/2015 Document Reviewed: 06/24/2014 Elsevier Interactive Patient Education  2017 [employer].

## 2022-01-30 ENCOUNTER — Other Ambulatory Visit: Payer: Self-pay

## 2022-01-30 NOTE — Patient Outreach (Signed)
  Care Coordination   01/30/2022 Name: Becky Boyd MRN: [id number] DOB: 1955/08/27   Care Coordination Outreach Attempts:  An unsuccessful telephone outreach was attempted today to offer the patient information about available care coordination services as a benefit of their health plan.   Follow Up Plan:  Additional outreach attempts will be made to offer the patient care coordination information and services.   Encounter Outcome:  No Answer  Care Coordination Interventions Activated:  No   Care Coordination Interventions:  No, not indicated   Dudley Major RN, BSN,CCM, CDE Care Management Coordinator [facility] [phone]

## 2022-01-31 ENCOUNTER — Other Ambulatory Visit: Payer: Self-pay | Admitting: Family Medicine

## 2022-01-31 DIAGNOSIS — Z780 Asymptomatic menopausal state: Secondary | ICD-10-CM

## 2022-02-05 DIAGNOSIS — M21372 Foot drop, left foot: Secondary | ICD-10-CM | POA: Diagnosis not present

## 2022-02-05 DIAGNOSIS — M722 Plantar fascial fibromatosis: Secondary | ICD-10-CM | POA: Diagnosis not present

## 2022-02-05 DIAGNOSIS — Z79899 Other long term (current) drug therapy: Secondary | ICD-10-CM | POA: Diagnosis not present

## 2022-02-05 DIAGNOSIS — M25511 Pain in right shoulder: Secondary | ICD-10-CM | POA: Diagnosis not present

## 2022-02-05 DIAGNOSIS — M797 Fibromyalgia: Secondary | ICD-10-CM | POA: Diagnosis not present

## 2022-02-05 DIAGNOSIS — M359 Systemic involvement of connective tissue, unspecified: Secondary | ICD-10-CM | POA: Diagnosis not present

## 2022-02-05 DIAGNOSIS — M0579 Rheumatoid arthritis with rheumatoid factor of multiple sites without organ or systems involvement: Secondary | ICD-10-CM | POA: Diagnosis not present

## 2022-02-05 DIAGNOSIS — M5136 Other intervertebral disc degeneration, lumbar region: Secondary | ICD-10-CM | POA: Diagnosis not present

## 2022-02-08 ENCOUNTER — Telehealth: Payer: Self-pay

## 2022-02-08 NOTE — Patient Outreach (Signed)
  Care Coordination   02/08/2022 Name: [name] MRN: [id number] DOB: 09/25/1955   Care Coordination Outreach Attempts:  A second unsuccessful outreach was attempted today to offer the patient with information about available care coordination services as a benefit of their health plan.     Follow Up Plan:  Additional outreach attempts will be made to offer the patient care coordination information and services.   Encounter Outcome:  No Answer  Care Coordination Interventions Activated:  No   Care Coordination Interventions:  No, not indicated    Dudley Major RN, BSN,CCM, CDE Care Management Coordinator [facility] Care Management [phone]

## 2022-02-11 DIAGNOSIS — R109 Unspecified abdominal pain: Secondary | ICD-10-CM | POA: Diagnosis not present

## 2022-02-11 DIAGNOSIS — N200 Calculus of kidney: Secondary | ICD-10-CM | POA: Diagnosis not present

## 2022-02-11 DIAGNOSIS — I700 Atherosclerosis of aorta: Secondary | ICD-10-CM | POA: Diagnosis not present

## 2022-02-14 ENCOUNTER — Ambulatory Visit
Admission: RE | Admit: 2022-02-14 | Discharge: 2022-02-14 | Disposition: A | Discharge status: Home / Self Care | Payer: Medicare Other | Source: Ambulatory Visit | Attending: Family Medicine | Admitting: Family Medicine

## 2022-02-14 DIAGNOSIS — Z780 Asymptomatic menopausal state: Secondary | ICD-10-CM | POA: Diagnosis not present

## 2022-02-26 ENCOUNTER — Telehealth: Payer: Self-pay

## 2022-02-26 NOTE — Patient Outreach (Signed)
  Care Coordination   02/26/2022 Name: Becky Boyd MRN: [id number] DOB: 1955-08-15   Care Coordination Outreach Attempts:  A third unsuccessful outreach was attempted today to offer the patient with information about available care coordination services as a benefit of their health plan.   Follow Up Plan:  No further outreach attempts will be made at this time. We have been unable to contact the patient to offer or enroll patient in care coordination services  Encounter Outcome:  No Answer  Care Coordination Interventions Activated:  No   Care Coordination Interventions:  No, not indicated    Peter Garter RN, BSN,CCM, [place] [facility] [phone]

## 2022-02-28 DIAGNOSIS — N200 Calculus of kidney: Secondary | ICD-10-CM | POA: Diagnosis not present

## 2022-03-01 ENCOUNTER — Other Ambulatory Visit: Payer: Self-pay | Admitting: Family Medicine

## 2022-03-01 DIAGNOSIS — R053 Chronic cough: Secondary | ICD-10-CM

## 2022-03-03 DIAGNOSIS — R509 Fever, unspecified: Secondary | ICD-10-CM | POA: Diagnosis not present

## 2022-03-03 DIAGNOSIS — R8281 Pyuria: Secondary | ICD-10-CM | POA: Diagnosis not present

## 2022-03-03 DIAGNOSIS — M545 Low back pain, unspecified: Secondary | ICD-10-CM | POA: Diagnosis not present

## 2022-03-03 DIAGNOSIS — R748 Abnormal levels of other serum enzymes: Secondary | ICD-10-CM | POA: Diagnosis not present

## 2022-03-03 DIAGNOSIS — M48061 Spinal stenosis, lumbar region without neurogenic claudication: Secondary | ICD-10-CM | POA: Diagnosis not present

## 2022-03-03 DIAGNOSIS — Z9889 Other specified postprocedural states: Secondary | ICD-10-CM | POA: Diagnosis not present

## 2022-03-03 DIAGNOSIS — Z20822 Contact with and (suspected) exposure to covid-19: Secondary | ICD-10-CM | POA: Diagnosis not present

## 2022-03-03 DIAGNOSIS — R109 Unspecified abdominal pain: Secondary | ICD-10-CM | POA: Diagnosis not present

## 2022-03-03 DIAGNOSIS — E876 Hypokalemia: Secondary | ICD-10-CM | POA: Diagnosis not present

## 2022-03-03 DIAGNOSIS — M47816 Spondylosis without myelopathy or radiculopathy, lumbar region: Secondary | ICD-10-CM | POA: Diagnosis not present

## 2022-03-03 DIAGNOSIS — M542 Cervicalgia: Secondary | ICD-10-CM | POA: Diagnosis not present

## 2022-03-03 DIAGNOSIS — M4316 Spondylolisthesis, lumbar region: Secondary | ICD-10-CM | POA: Diagnosis not present

## 2022-03-03 DIAGNOSIS — R112 Nausea with vomiting, unspecified: Secondary | ICD-10-CM | POA: Diagnosis not present

## 2022-03-03 DIAGNOSIS — N200 Calculus of kidney: Secondary | ICD-10-CM | POA: Diagnosis not present

## 2022-03-03 DIAGNOSIS — N210 Calculus in bladder: Secondary | ICD-10-CM | POA: Diagnosis not present

## 2022-03-03 DIAGNOSIS — N202 Calculus of kidney with calculus of ureter: Secondary | ICD-10-CM | POA: Diagnosis not present

## 2022-03-07 DIAGNOSIS — N200 Calculus of kidney: Secondary | ICD-10-CM | POA: Diagnosis not present

## 2022-03-11 ENCOUNTER — Telehealth: Payer: Self-pay | Admitting: Neurology

## 2022-03-11 ENCOUNTER — Ambulatory Visit: Payer: Medicare Other | Admitting: Neurology

## 2022-03-11 ENCOUNTER — Encounter: Payer: Self-pay | Admitting: Neurology

## 2022-03-11 VITALS — BP 141/96 | HR 78 | Ht 60.0 in | Wt 141.6 lb

## 2022-03-11 DIAGNOSIS — R413 Other amnesia: Secondary | ICD-10-CM

## 2022-03-11 DIAGNOSIS — E785 Hyperlipidemia, unspecified: Secondary | ICD-10-CM | POA: Diagnosis not present

## 2022-03-11 DIAGNOSIS — R0683 Snoring: Secondary | ICD-10-CM | POA: Diagnosis not present

## 2022-03-11 DIAGNOSIS — R9082 White matter disease, unspecified: Secondary | ICD-10-CM | POA: Diagnosis not present

## 2022-03-11 DIAGNOSIS — R7309 Other abnormal glucose: Secondary | ICD-10-CM | POA: Diagnosis not present

## 2022-03-11 DIAGNOSIS — R400 Somnolence: Secondary | ICD-10-CM

## 2022-03-11 NOTE — Progress Notes (Signed)
[facility]    Provider:  Dr Lucia Gaskins Referring Provider: Sheliah Hatch, MD Primary Care Physician:  Sheliah Hatch, MD  CC:  Headaches, abnormal white matter changes on MRI brain progressive with new neurologic symptoms. She has a PMHx of sjogren's, sicca syndrome, RA, fibromyalgia, hypothyroidism, lupus   03/11/2022: Patient is here for follow up, she was transitioned to [facility]. I reviewed notes from [facility] and she was evaluated with no evidence of demyelinating or inflammatory disease to cause her extensive white matter changes. Repeat formal neurocognitive testing was recommended, eeg was recommended as well as testing for CADASIL but I believe this is unlikely. We discussed small vessel disease and causes of memory loss and other causes. There has been progression. She is always tired and snores. They feel memory Is worse. We discussed white matter changes and chronic microvascular ischmia, I gave them literature to read.  DIAGNOSTIC STUDIES:   Per report MRI scan of the brain from 2012, 2015, in 2017: Shows moderate nonspecific white matter changes. Including white matter changes in the pons. Per report brain MRI from October 2022: White matter signal change in the basal ganglia, subcortical white matter, and prominence increased from 2017. No enhancement, no restricted diffusion.  I reviewed brain MRI from 2012: Mild to moderate nonspecific white matter changes mostly subcortical, in addition to signal change in the central pons. No definite demyelinating lesions. No diffusion restriction, no enhancement. No focal T1 hypointensity. No thinning of the corpus callosum.  I reviewed brain MRI from October 2022: Increase in the extent of white matter changes, with extensive white matter signal change that is mostly subcortical and is bilateral, some increase in the signal change in the mid pons. No enhancement, no restricted diffusion. No thinning of the  corpus callosum. No focal T1 hypointensity. No definite demyelinating lesions.  Per report she had neuropsychiatric testing several years ago that was unremarkable.  LABORATORY STUDIES:   Two spinal taps around 2012-2017: Unremarkable. Spinal tap May 17, 2021, with normal protein and glucose, minimally elevated white blood cell count at 18, mostly lymphocytic 86%, red blood cells 251. Immune profile within normal IgG index and synthesis rate. 2 bands that are common in the CSF and serum. No unique CSF bands.  12/22: B12 within normal, TSH within normal. SSA positive.   HPI May 03, 2021: Becky Boyd is a 66 year old female here as a referral from Dr. Beverely Low.  We saw her in the past over 3 years ago for headaches but that was over 3 years ago and patient's headaches had resolved and she declined repeat brain Imaging and workup at that time.  Patient is worried about her brain, having Multiple Sclerosis.  Patient has a history of white matter changes in the brain.  I reviewed Dr. Rennis Golden notes: Daughter is noticing increased difficulty with coordination, left foot drop, dizziness, confusion, the confusion definitely worsened after her near death battle with COVID.  They requested an MS specialist at [facility]. I explained that I am a general neurologist and not an MS specialist but I can certainly start an evaluation (recommend in the past when I saw her in 2019) and end her to a specialist.  I reviewed her imaging with our MS specialist, and given her age and some of the findings and 2 prior negative LP workups for MS he states there is a less likelihood of MS. However there is no question her white-matter abnormalities have significantly progressed based on imaging from 2017  and even further back(see imaging pictures in assessment and plan). We discussed at length, reviewed images together, I recommend repeating the workup and also repeating lumbar puncture and CTA of the head and neck and sending to  [place] [facility]. May need a cerebral angiogram.  Per patient, her memory is "gone", she is having difficult thinking. She gets confused very easily. Talking to her is like playing charades, she forgets what she wants to say. She can remember things from a long time ago but not from this morning. She gets off balance. She now has a foot weakness left mostly. She has strange sensations in the lower extremities. She gets dizzy for no reason when she stands up or standing can be room spinning or lightheaded, brief, does it then goes away. Her eyesight is getting very bad, she only uses drug-store glasses, she is going to the eye doctor, her neck is fused s/p surgery, she has a lot of low back pain that radiates into the right buttocks. No significant numbness or tingling in the feet or the hands. Feet may tingle occasionally. She is really fatigued and tired of being tired. She does not know if she snores, daughter says she snores, she has sjogren's and RA and also has a lot of joint and muscle disease.  No smoking, no drinking. No other focal neurologic deficits, associated symptoms, inciting events or modifiable factors.   Sees Leigh Aurora in [place] rheumatology  MRI 03/12/2021: personally reviewed and agree with the following  No acute abnormality   Progressive hyperintense signal changes in the white matter, basal ganglia, and pons compared with 2017. Differential diagnosis includes progressive chronic microvascular ischemic change or vasculitis. Correlate with risk factors for small vessel disease. Demyelinating disease also possible.  HPI 10/15/2017:  Becky Boyd is a  here as a referral from Dr. Birdie Riddle for headaches. But she says she is not sure why she is here. She used to see Dr. Melton Alar for headaches which are improved. Every few years he would send her for MRI of her brain bc of white matter changes, we reviewed the images of her brain together and discussed chronic white matter  changes. . She has dizziness. She has pain in her legs. Her lower back hurts, she has 12-13/10 in pain, it hurts across the lower back and into the lower right buttocks. Pain is worsening. The calves in her legs really hurt. If she walk far she has to stop pr bend over and then continue. Her memory is poor but neuropsych testing in the past was unremarkable. She can't afford physical therapy. She has a lot of pain. However she declines any treatment or any testing, she says her headaches are not that bad or that often, she sees her doctor for pain and just wanted to ask me some questions. I encouraged her to return if symptoms worsen. She says she has a lot of chronic problems but nothing acute or new or needing attention per patient.   Reviewed notes, labs and imaging from outside physicians, which showed:    Reviewed MRi images and agree with the following: Progressive, moderate cerebral white matter and brainstem signal changes from 2015, nonspecific. This may reflect demyelinating disease, chronic small vessel ischemia, or vasculitis among others.   TSH, cbc, cmp normal.   LP MS panel was negative for MS 2015  Review of Systems: Patient complains of symptoms per HPI as well as the following symptoms: foot drop, extremely mild, can walk to heels .  Pertinent negatives and positives per HPI. All others negative    Social History   Socioeconomic History   Marital status: Married    Spouse name: Not on file   Number of children: 2   Years of education: Not on file   Highest education level: High school graduate  Occupational History   Not on file  Tobacco Use   Smoking status: Never   Smokeless tobacco: Never  Vaping Use   Vaping Use: Never used  Substance and Sexual Activity   Alcohol use: Never   Drug use: Never   Sexual activity: Not on file  Other Topics Concern   Not on file  Social History Narrative   Lives at home with her husband   Right handed   Caffeine: occasional  pepsi or mtn dew   Social Determinants of Health   Financial Resource Strain: Low Risk  (01/17/2022)   Overall Financial Resource Strain (CARDIA)    Difficulty of Paying Living Expenses: Not hard at all  Food Insecurity: No Food Insecurity (01/17/2022)   Hunger Vital Sign    Worried About Running Out of Food in the Last Year: Never true    Ran Out of Food in the Last Year: Never true  Transportation Needs: No Transportation Needs (01/17/2022)   PRAPARE - Administrator, Civil Service (Medical): No    Lack of Transportation (Non-Medical): No  Physical Activity: Insufficiently Active (01/17/2022)   Exercise Vital Sign    Days of Exercise per Week: 3 days    Minutes of Exercise per Session: 30 min  Stress: No Stress Concern Present (01/17/2022)   [employer] of Occupational Health - Occupational Stress Questionnaire    Feeling of Stress : Not at all  Social Connections: Moderately Integrated (01/17/2022)   Social Connection and Isolation Panel [NHANES]    Frequency of Communication with Friends and Family: Three times a week    Frequency of Social Gatherings with Friends and Family: Three times a week    Attends Religious Services: 1 to 4 times per year    Active Member of Clubs or Organizations: No    Attends Banker Meetings: Never    Marital Status: Married  Catering manager Violence: Not At Risk (01/17/2022)   Humiliation, Afraid, Rape, and Kick questionnaire    Fear of Current or Ex-Partner: No    Emotionally Abused: No    Physically Abused: No    Sexually Abused: No    Family History  Problem Relation Age of Onset   COPD Mother    Heart disease Father    Diabetes Sister    COPD Brother    Breast cancer Paternal Aunt    Aneurysm Paternal Aunt    Diabetes Other    Dementia Neg Hx    Alzheimer's disease Neg Hx     Past Medical History:  Diagnosis Date   Aneurysm (HCC)    Anorexia    COVID    Dysphagia    Fibromyalgia    Fibromyalgia     GERD (gastroesophageal reflux disease)    Hashimoto thyroiditis    History of kidney stones    hx of multiple kidney stones    Hypothyroidism    Kidney stones    Lupus (HCC)    Palpitations    Rheumatoid arthritis (HCC)    Sjogren's disease (HCC)     Past Surgical History:  Procedure Laterality Date   ABDOMINAL HYSTERECTOMY     CERVICAL FUSION  2004  C4-C5 with Instrumentation   CERVICAL FUSION  1990   C3-C4   cervical rods     CHOLECYSTECTOMY     kidney stone     l5-s1 surgery     LUMBAR MICRODISCECTOMY  2004   Bilateral L5-S1   TOTAL KNEE ARTHROPLASTY Left 06/09/2020   Procedure: TOTAL KNEE ARTHROPLASTY;  Surgeon: Jene Every, MD;  Location: [facility];  Service: Orthopedics;  Laterality: Left;    Current Outpatient Medications  Medication Sig Dispense Refill   aspirin (ASPIRIN 81) 81 MG chewable tablet Chew by mouth daily.     atorvastatin (LIPITOR) 20 MG tablet TAKE ONE TABLET BY MOUTH ONE TIME DAILY 90 tablet 0   baclofen (LIORESAL) 10 MG tablet TAKE ONE TABLET BY MOUTH THREE TIMES DAILY 30 tablet 0   Carboxymethylcellul-Glycerin (LUBRICATING EYE DROPS OP) Place 1 drop into both eyes daily as needed (dry eyes).     DULoxetine (CYMBALTA) 60 MG capsule Take 60 mg by mouth at bedtime.     HYDROcodone-acetaminophen (NORCO/VICODIN) 5-325 MG tablet Take 1 tablet by mouth every 6 (six) hours as needed for moderate pain.     Iron-Vitamin C 65-125 MG TABS Take by mouth.     levothyroxine (SYNTHROID) 88 MCG tablet TAKE ONE TABLET BY MOUTH ONE TIME DAILY 90 tablet 0   meloxicam (MOBIC) 7.5 MG tablet Take 7.5 mg by mouth daily.     pantoprazole (PROTONIX) 40 MG tablet TAKE ONE TABLET BY MOUTH ONE TIME DAILY 90 tablet 0   predniSONE (DELTASONE) 5 MG tablet Take 5 mg by mouth daily.     sulfaSALAzine (AZULFIDINE) 500 MG tablet Take 1,000 mg by mouth 2 (two) times daily.     Turmeric (QC TUMERIC COMPLEX) 500 MG CAPS Take 1,000 mg by mouth in the morning and at bedtime.     No current  facility-administered medications for this visit.    Allergies as of 03/11/2022 - Review Complete 03/11/2022  Allergen Reaction Noted   Plaquenil [hydroxychloroquine sulfate] Other (See Comments) 09/30/2012    Vitals: BP (!) 141/96   Pulse 78   Ht 5' (1.524 m)   Wt 141 lb 9.6 oz (64.2 kg)   BMI 27.65 kg/m  Last Weight:  Wt Readings from Last 1 Encounters:  03/11/22 141 lb 9.6 oz (64.2 kg)   Last Height:   Ht Readings from Last 1 Encounters:  03/11/22 5' (1.524 m)    Physical exam: stable Exam: Gen: NAD, conversant, well nourised, well groomed                     CV: RRR, no MRG. No Carotid Bruits. No peripheral edema, warm, nontender Eyes: Conjunctivae clear without exudates or hemorrhage  Neuro: stable Detailed Neurologic Exam  Speech:    Speech is normal; fluent and spontaneous with normal comprehension.  Cognition:      03/11/2022   11:07 AM 05/03/2021    4:19 PM 10/08/2017   12:52 PM  MMSE - Mini Mental State Exam  Orientation to time 5 5 4   Orientation to Place 4 5 5   Registration 3 3 3   Attention/ Calculation 2 4 5   Recall 3 2 3   Language- name 2 objects 2 2 2   Language- repeat 1 1 1   Language- follow 3 step command 3 3 3   Language- read & follow direction 1 1 1   Write a sentence 1 1 1   Copy design 0 1 1  Total score 25 28 29  The patient is oriented to person, place, and time;     recent and remote memory intact;     language fluent;     normal attention, concentration,     fund of knowledge Cranial Nerves:    The pupils are equal, round, and reactive to light. Pupils too small to bisualize pupils.  Visual fields are full to finger confrontation. Extraocular movements are intact. Trigeminal sensation is intact and the muscles of mastication are normal. The face is symmetric. The palate elevates in the midline. Hearing intact. Voice is normal. Shoulder shrug is normal. The tongue has normal motion without fasciculations.   Coordination:     Normal finger to nose and heel to shin.   Gait:    Heel-toe and tandem gait intact but with imbalance.   Motor Observation:    No asymmetry, no atrophy, and no involuntary movements noted. Tone:    Normal muscle tone.    Posture:    Posture is normal. normal erect    Strength: she has some prox weakness but I believe this is due to pain and that her strength is intact and symmetrical. I did not appreciate much of a foot drop, she was able to walk on both heels, maybe 5-/5 left dorsiflexion.      Sensation: intact to LT, pin prick distally, and vibration     Reflex Exam:  DTR's: AJs 1-2+, patellars difficult due to pain but appaer 2+, biceps 2+,   symmetrical  Toes:    The toes are downgoing bilaterally.   Clonus:    Clonus is absent.    Assessment/Plan:  Becky Boyd is a 66 year old female here as a referral from Dr. Beverely Low.    Patient has a history of white matter changes in the brain.  I reviewed her imaging with our MS specialist, and given her age and some of the findings and 2 prior negative LP workups for MS he states there is a less likelihood of MS. I sent to specialist at [facility]. MMSE 25/30 today, declined  ASSESSMENT [facility]: 66 y.o. female with PMHx as above who presents for evaluation and management of white matter changes, cognitive dysfunction, and concern for multiple sclerosis. At this time the clinical presentation is nonspecific, no history of acute demyelinating syndromes, neurologic exam without any focal deficits. Two spinal taps have been unremarkable with a normal immune profile. Brain MRI shows extensive white matter signal change that is mostly subcortical and bilateral and also in the mid pons, the localization and characteristics are highly suggestive of advanced small vessel ischemic disease, given the significant increase in signal between 2012 and 2022, and given that she does not have significant vascular risk factors. This raises the concern for the  possibility of genetic disorders such as CADASIL, or other genetic etiologies of advanced white matter change/small vessel ischemic disease. There is no characteristic anterior temporal lobe involvement. Another possible etiology is adult onset leukodystrophy due to a spontaneous mutation ,that would be less likely. Given that the most significant clinical symptom is cognitive dysfunction, would recommend repeat neuropsychiatric testing to further evaluate the characteristics and extent of cognitive dysfunction.  PLAN: - No evidence for CNS inflammatory disorder or multiple sclerosis. - Would recommend repeat neuropsychiatric testing.ordered - Would recommend an EEG to screen for any epileptic activity, given concern for CADASIL.EEG ordered - Consider testing for CADASIL, less likely - Follow-up 6 months - gave them literature to read on chronic microvascular ischemic changes, signs, symptoms - screened  for sleep apnea: ESS 11 will send for sleep evaluation as untreated can cause chronic microvascular changes: snores, naps, memory loss, daytime somnolence - continue daily aspirin for stroke prevention  Orders Placed This Encounter  Procedures   Lipid Panel   Hemoglobin A1c   Ambulatory referral to Neuropsychology   Ambulatory referral to Sleep Studies   EEG adult      Below are t2 flair from 2022 and 2017  Sagittal flair 2022    Axial Flair 2022 vs 2017       Orders Placed This Encounter  Procedures   Lipid Panel   Hemoglobin A1c   Ambulatory referral to Neuropsychology   Ambulatory referral to Sleep Studies   EEG adult    Cc: Dr. Lindon Romp, MD  [facility] [address] [place], [place] [address]  Phone [phone] Fax [phone]  I spent over 105 minutes of face-to-face and non-face-to-face time with patient on the  1. Memory loss   2. Elevated glucose   3. Hyperlipidemia, unspecified hyperlipidemia type    4. White matter abnormality on MRI of brain   5. Snores   6. Daytime somnolence     diagnosis.  This included previsit chart review, lab review, study review, order entry, electronic health record documentation, patient education on the different diagnostic and therapeutic options, counseling and coordination of care, risks and benefits of management, compliance, or risk factor reduction  Cc: Dr. Beverely Low, Dr. Dierdre Forth  I spent over 40 minutes of face-to-face and non-face-to-face time with patient on the  1. Memory loss   2. Elevated glucose   3. Hyperlipidemia, unspecified hyperlipidemia type   4. White matter abnormality on MRI of brain   5. Snores   6. Daytime somnolence    diagnosis.  This included previsit chart review, lab review, study review, order entry, electronic health record documentation, patient education on the different diagnostic and therapeutic options, counseling and coordination of care, risks and benefits of management, compliance, or risk factor reduction

## 2022-03-11 NOTE — Patient Instructions (Addendum)
-   Formal memory testing - dr Alphonzo Severance - Read Literature chronic microvascular changes in the brain (white matter change sin the brain)\Needs to - closely manage cholesterol, HTN, diabetes, sleep apnea or anything that can make these changes worse - Consider sleep test for sleep apnea? - Blood work

## 2022-03-11 NOTE — Telephone Encounter (Signed)
Referral for neuropsychology with Dr. Alphonzo Severance. Phone: [phone], Fax: [phone]

## 2022-03-17 ENCOUNTER — Other Ambulatory Visit: Payer: Self-pay | Admitting: Family Medicine

## 2022-03-18 ENCOUNTER — Other Ambulatory Visit (INDEPENDENT_AMBULATORY_CARE_PROVIDER_SITE_OTHER): Payer: Self-pay

## 2022-03-18 ENCOUNTER — Other Ambulatory Visit: Payer: Self-pay | Admitting: Family Medicine

## 2022-03-18 DIAGNOSIS — R7309 Other abnormal glucose: Secondary | ICD-10-CM | POA: Diagnosis not present

## 2022-03-18 DIAGNOSIS — E785 Hyperlipidemia, unspecified: Secondary | ICD-10-CM | POA: Diagnosis not present

## 2022-03-18 DIAGNOSIS — Z0289 Encounter for other administrative examinations: Secondary | ICD-10-CM

## 2022-03-18 MED ORDER — BACLOFEN 10 MG PO TABS: 10.0000 mg | ORAL_TABLET | Freq: Three times a day (TID) | ORAL | 0 refills | Status: DC

## 2022-03-18 NOTE — Telephone Encounter (Signed)
Patient is requesting a refill of the following medications: Requested Prescriptions   Pending Prescriptions Disp Refills   baclofen (LIORESAL) 10 MG tablet 30 tablet 0    Sig: Take 1 tablet (10 mg total) by mouth 3 (three) times daily.    Date of patient request: 03/18/22 Last office visit: 05/21/21 Date of last refill: 12/27/21 Last refill amount: 30

## 2022-03-18 NOTE — Telephone Encounter (Signed)
Encourage patient to contact the pharmacy for refills or they can request refills through [facility]  (Please schedule appointment if patient has not been seen in over a year) Last appt was 05/21/2021  WHAT PHARMACY WOULD THEY LIKE THIS SENT TO: [facility] # [place], [place] NAME & DOSE: Baclofen 10 mg   NOTES/COMMENTS FROM PATIENT: pt needs a refill on medication. Pt has an appt on 03/19/22.      [place] office please notify patient: It takes 48-72 hours to process rx refill requests Ask patient to call pharmacy to ensure rx is ready before heading there.

## 2022-03-19 ENCOUNTER — Encounter: Payer: Self-pay | Admitting: Family Medicine

## 2022-03-19 ENCOUNTER — Other Ambulatory Visit: Payer: Medicare Other | Admitting: *Deleted

## 2022-03-19 ENCOUNTER — Ambulatory Visit (INDEPENDENT_AMBULATORY_CARE_PROVIDER_SITE_OTHER): Payer: Medicare Other | Admitting: Family Medicine

## 2022-03-19 VITALS — BP 118/70 | HR 78 | Temp 98.2°F | Resp 16 | Ht 61.0 in | Wt 138.1 lb

## 2022-03-19 DIAGNOSIS — E785 Hyperlipidemia, unspecified: Secondary | ICD-10-CM | POA: Diagnosis not present

## 2022-03-19 DIAGNOSIS — M545 Low back pain, unspecified: Secondary | ICD-10-CM | POA: Diagnosis not present

## 2022-03-19 DIAGNOSIS — M5136 Other intervertebral disc degeneration, lumbar region: Secondary | ICD-10-CM | POA: Diagnosis not present

## 2022-03-19 DIAGNOSIS — E038 Other specified hypothyroidism: Secondary | ICD-10-CM | POA: Diagnosis not present

## 2022-03-19 DIAGNOSIS — M51369 Other intervertebral disc degeneration, lumbar region without mention of lumbar back pain or lower extremity pain: Secondary | ICD-10-CM

## 2022-03-19 LAB — LIPID PANEL
Chol/HDL Ratio: 2.5 ratio (ref 0.0–4.4)
Cholesterol, Total: 162 mg/dL (ref 100–199)
HDL: 65 mg/dL (ref >39)
LDL Chol Calc (NIH): 77 mg/dL (ref 0–99)
Triglycerides: 113 mg/dL (ref 0–149)
VLDL Cholesterol Cal: 20 mg/dL (ref 5–40)

## 2022-03-19 LAB — BASIC METABOLIC PANEL
BUN: 15 mg/dL (ref 6–23)
CO2: 27 mEq/L (ref 19–32)
Calcium: 9.9 mg/dL (ref 8.4–10.5)
Chloride: 102 mEq/L (ref 96–112)
Creatinine, Ser: 1.21 mg/dL — ABNORMAL HIGH (ref 0.40–1.20)
GFR: 46.14 mL/min — ABNORMAL LOW (ref >60.00)
Glucose, Bld: 87 mg/dL (ref 70–99)
Potassium: 4.1 mEq/L (ref 3.5–5.1)
Sodium: 137 mEq/L (ref 135–145)

## 2022-03-19 LAB — HEPATIC FUNCTION PANEL
ALT: 13 U/L (ref 0–35)
AST: 19 U/L (ref 0–37)
Albumin: 4.5 g/dL (ref 3.5–5.2)
Alkaline Phosphatase: 108 U/L (ref 39–117)
Bilirubin, Direct: 0.1 mg/dL (ref 0.0–0.3)
Total Bilirubin: 0.4 mg/dL (ref 0.2–1.2)
Total Protein: 7.6 g/dL (ref 6.0–8.3)

## 2022-03-19 LAB — TSH: TSH: 8.65 u[IU]/mL — ABNORMAL HIGH (ref 0.35–5.50)

## 2022-03-19 LAB — HEMOGLOBIN A1C
Est. average glucose Bld gHb Est-mCnc: 94 mg/dL
Hgb A1c MFr Bld: 4.9 % (ref 4.8–5.6)

## 2022-03-19 MED ORDER — BACLOFEN 10 MG PO TABS: 10.0000 mg | ORAL_TABLET | Freq: Three times a day (TID) | ORAL | 0 refills | Status: DC

## 2022-03-19 NOTE — Progress Notes (Signed)
   Subjective:    Patient ID: Becky Boyd, female    DOB: 27-Apr-1956, 66 y.o.   MRN: [id number]  HPI Hyperlipidemia- chronic problem, on Lipitor 20mg  daily.  No CP, SOB, abd pain, N/V.  Lipids checked yesterday- total cholesterol 162, LDL 77, HDL 65  Hypothyroid- chronic problem, on Levothyroxine 79mcg daily.  Some fatigue.  No changes to skin/hair/nails.  Chronic back pain- ongoing issue for pt.  Manages w/ baclofen 10mg  TID   Review of Systems For ROS see HPI     Objective:   Physical Exam Vitals reviewed.  Constitutional:      General: She is not in acute distress.    Appearance: Normal appearance. She is well-developed. She is not ill-appearing.  HENT:     Head: Normocephalic and atraumatic.  Eyes:     Conjunctiva/sclera: Conjunctivae normal.     Pupils: Pupils are equal, round, and reactive to light.  Neck:     Thyroid: No thyromegaly.  Cardiovascular:     Rate and Rhythm: Normal rate and regular rhythm.     Pulses: Normal pulses.     Heart sounds: Normal heart sounds. No murmur heard. Pulmonary:     Effort: Pulmonary effort is normal. No respiratory distress.     Breath sounds: Normal breath sounds.  Abdominal:     General: There is no distension.     Palpations: Abdomen is soft.     Tenderness: There is no abdominal tenderness.  Musculoskeletal:     Cervical back: Normal range of motion and neck supple.     Right lower leg: No edema.     Left lower leg: No edema.  Lymphadenopathy:     Cervical: No cervical adenopathy.  Skin:    General: Skin is warm and dry.  Neurological:     General: No focal deficit present.     Mental Status: She is alert and oriented to person, place, and time.  Psychiatric:        Mood and Affect: Mood normal.        Behavior: Behavior normal.           Assessment & Plan:

## 2022-03-19 NOTE — Telephone Encounter (Signed)
Called lm to inform her this has been sent

## 2022-03-19 NOTE — Patient Instructions (Signed)
Schedule your complete physical in 6 months We'll notify you of your lab results and make any changes if needed Keep up the good work on healthy diet and regular exercise- you're doing great! Call with any questions or concerns Happy Fall!!! 

## 2022-03-20 MED ORDER — LEVOTHYROXINE SODIUM 100 MCG PO TABS: 100.0000 ug | ORAL_TABLET | Freq: Every day | ORAL | 3 refills | Status: DC

## 2022-03-21 ENCOUNTER — Telehealth: Payer: Self-pay

## 2022-03-21 ENCOUNTER — Other Ambulatory Visit: Payer: Self-pay

## 2022-03-21 DIAGNOSIS — E039 Hypothyroidism, unspecified: Secondary | ICD-10-CM

## 2022-03-21 DIAGNOSIS — R7989 Other specified abnormal findings of blood chemistry: Secondary | ICD-10-CM

## 2022-03-21 NOTE — Telephone Encounter (Signed)
-----   Message from Midge Minium, MD sent at 03/20/2022  7:33 AM EDT ----- Your TSH level is high- meaning that your thyroid is underfunctioning.  Based on this, we need to increase your Levothyroxine to 128mcg daily (prescription sent to your pharmacy) and repeat your TSH level at a lab only visit in 1 month (TSH, dx hypothyroid)  Also, your Creatinine is mildly elevated- which causes a decrease in your GFR.  Please increase your water intake and we'll repeat this at the same lab only visit in 1 month (BMP, dx elevated serum creatinine)

## 2022-03-21 NOTE — Telephone Encounter (Signed)
Pt is a aware of this and has scheduled a lab appt in 1 mth

## 2022-03-21 NOTE — Telephone Encounter (Signed)
Left pt a VM to return my call in regards to her lab results . The repeat TSH and BMP order's have been put in for one month . Her levothyroxine has been sent to pharmacy . Pt will need a lab only visit for one month

## 2022-03-25 DIAGNOSIS — N200 Calculus of kidney: Secondary | ICD-10-CM | POA: Diagnosis not present

## 2022-04-01 ENCOUNTER — Ambulatory Visit: Payer: Medicare Other | Admitting: Neurology

## 2022-04-01 DIAGNOSIS — R4182 Altered mental status, unspecified: Secondary | ICD-10-CM | POA: Diagnosis not present

## 2022-04-01 DIAGNOSIS — R413 Other amnesia: Secondary | ICD-10-CM

## 2022-04-01 DIAGNOSIS — R9082 White matter disease, unspecified: Secondary | ICD-10-CM

## 2022-04-01 NOTE — Procedures (Signed)
    History:  66 year old woman with non specific neurologic   EEG classification: Awake and drowsy  Description of the recording: The background rhythms of this recording consists of a fairly well modulated medium amplitude alpha rhythm of 10 Hz that is reactive to eye opening and closure. Present in the anterior head region is a 15-20 Hz beta activity. Photic stimulation was performed, did not show any abnormalities. Hyperventilation was also performed, did not show any abnormalities. Drowsiness was manifested by background fragmentation. No abnormal epileptiform discharges seen during this recording. There were bilateral independent right and left temporal focal slowing. There were no electrographic seizure identified.   Abnormality: Bilateral independent right and left temporal focal slowing  Impression: This is an abnormal EEG recorded while drowsy and awake due to presence of bilateral independent right and left temporal focal slowing which is consistent with areas of neuronal dysfunction in the bilateral right and left temporal regions.     Alric Ran, MD [facility]

## 2022-04-02 NOTE — Assessment & Plan Note (Signed)
Chronic problem.  Manages w/ Baclofen 10mg  TID.  Refill provided.

## 2022-04-02 NOTE — Assessment & Plan Note (Signed)
Chronic problem.  Currently asymptomatic w/ exception of fatigue on Levothyroxine 60mcg daily.  Check labs.  Adjust meds prn

## 2022-04-02 NOTE — Assessment & Plan Note (Signed)
Chronic problem.  Currently on Lipitor 20mg  daily w/o difficulty.  Reviewed labs done yesterday- LDL 77, HDL 65.  No changes at this time

## 2022-04-09 ENCOUNTER — Institutional Professional Consult (permissible substitution): Payer: Medicare Other | Admitting: Neurology

## 2022-04-23 ENCOUNTER — Other Ambulatory Visit (INDEPENDENT_AMBULATORY_CARE_PROVIDER_SITE_OTHER): Payer: Medicare Other

## 2022-04-23 DIAGNOSIS — E039 Hypothyroidism, unspecified: Secondary | ICD-10-CM | POA: Diagnosis not present

## 2022-04-23 DIAGNOSIS — R7989 Other specified abnormal findings of blood chemistry: Secondary | ICD-10-CM

## 2022-04-24 ENCOUNTER — Telehealth: Payer: Self-pay

## 2022-04-24 ENCOUNTER — Other Ambulatory Visit: Payer: Self-pay

## 2022-04-24 DIAGNOSIS — E039 Hypothyroidism, unspecified: Secondary | ICD-10-CM

## 2022-04-24 LAB — BASIC METABOLIC PANEL
BUN: 13 mg/dL (ref 6–23)
CO2: 30 mEq/L (ref 19–32)
Calcium: 9.1 mg/dL (ref 8.4–10.5)
Chloride: 102 mEq/L (ref 96–112)
Creatinine, Ser: 1.05 mg/dL (ref 0.40–1.20)
GFR: 54.66 mL/min — ABNORMAL LOW (ref >60.00)
Glucose, Bld: 99 mg/dL (ref 70–99)
Potassium: 3.9 mEq/L (ref 3.5–5.1)
Sodium: 140 mEq/L (ref 135–145)

## 2022-04-24 LAB — TSH: TSH: 5.89 u[IU]/mL — ABNORMAL HIGH (ref 0.35–5.50)

## 2022-04-24 MED ORDER — LEVOTHYROXINE SODIUM 112 MCG PO TABS: 112.0000 ug | ORAL_TABLET | Freq: Every day | ORAL | 3 refills | Status: DC

## 2022-04-24 NOTE — Telephone Encounter (Signed)
-----   Message from Sheliah Hatch, MD sent at 04/24/2022 11:27 AM EST ----- Creatinine (kidney function) is back in normal range- great news!  TSH is closer to normal but still out of range.  We will increase your Levothyroxine to daily (#30, 3 refills)- take 30 minutes prior eating or taking other medications.  We will then repeat your TSH at a lab only visit in 1 month

## 2022-04-24 NOTE — Telephone Encounter (Signed)
Informed pt of lab results and sent in Levothyroxine 112 mcg # 30 w/ 3 refills and TSH order is in . Pt is coming in for a lab only visit on 05/23/22

## 2022-05-23 ENCOUNTER — Other Ambulatory Visit (INDEPENDENT_AMBULATORY_CARE_PROVIDER_SITE_OTHER): Payer: Medicare Other

## 2022-05-23 DIAGNOSIS — E039 Hypothyroidism, unspecified: Secondary | ICD-10-CM | POA: Diagnosis not present

## 2022-05-23 LAB — TSH: TSH: 0.04 u[IU]/mL — ABNORMAL LOW (ref 0.35–5.50)

## 2022-05-24 ENCOUNTER — Telehealth: Payer: Self-pay

## 2022-05-24 ENCOUNTER — Other Ambulatory Visit: Payer: Self-pay

## 2022-05-24 DIAGNOSIS — E039 Hypothyroidism, unspecified: Secondary | ICD-10-CM

## 2022-05-24 MED ORDER — LEVOTHYROXINE SODIUM 88 MCG PO TABS: 88.0000 ug | ORAL_TABLET | Freq: Every day | ORAL | 3 refills | Status: DC

## 2022-05-24 NOTE — Telephone Encounter (Signed)
-----   Message from Sheliah Hatch, MD sent at 05/24/2022  7:46 AM EST ----- Now your TSH is too low- which means your medication dose is too high.  We will decrease to daily (#30, 3 refills) and repeat your TSH at a lab only visit in 1 month.  Happy Holidays!

## 2022-05-24 NOTE — Telephone Encounter (Signed)
Informed pt of lab results. Sent in Levothyroxine 88 mcg to pharmacy and repeat TSH order is in . Pt is coming in on Jan 19,2024 for lab only

## 2022-05-30 ENCOUNTER — Telehealth: Payer: Self-pay | Admitting: *Deleted

## 2022-05-30 ENCOUNTER — Encounter: Payer: Self-pay | Admitting: *Deleted

## 2022-05-30 NOTE — Patient Instructions (Signed)
Visit Information  Thank you for taking time to visit with me today. Please don't hesitate to contact me if I can be of assistance to you.   Following are the goals we discussed today:   Goals Addressed             This Visit's Progress    COMPLETED: care coordination activity       Care Coordination Interventions: Reviewed medications with patient and discussed adherence with no needed refills Reviewed scheduled/upcoming provider appointments including sufficient transportation Assessed social determinant of health barriers Educated on care management services utilizing social worker, Investment banker, operational and pharmacy. No needs presented today.          Please call the care guide team at [phone] if you need to cancel or reschedule your appointment.   If you are experiencing a Mental Health or Behavioral Health Crisis or need someone to talk to, please call the Suicide and Crisis Lifeline: 988  Patient verbalizes understanding of instructions and care plan provided today and agrees to view in MyChart. Active MyChart status and patient understanding of how to access instructions and care plan via MyChart confirmed with patient.     No further follow up required: No follow up needs  Elliot Cousin, RN Care Management Coordinator [facility] Main Office [phone]

## 2022-05-30 NOTE — Patient Outreach (Signed)
  Care Coordination   Initial Visit Note   05/30/2022 Name: Anyeli Hockenbury MRN: [id number] DOB: [birth year]  Adiel Mcnamara is a 66 y.o. year old female who sees Tabori, Helane Rima, MD for primary care. I spoke with  [name] by phone today.  What matters to the patients health and wellness today?  No needs    Goals Addressed             This Visit's Progress    COMPLETED: care coordination activity       Care Coordination Interventions: Reviewed medications with patient and discussed adherence with no needed refills Reviewed scheduled/upcoming provider appointments including sufficient transportation Assessed social determinant of health barriers Educated on care management services utilizing social worker, Investment banker, operational and pharmacy. No needs presented today.          SDOH assessments and interventions completed:  Yes  SDOH Interventions Today    Flowsheet Row Most Recent Value  SDOH Interventions   Food Insecurity Interventions Intervention Not Indicated  Housing Interventions Intervention Not Indicated  Transportation Interventions Intervention Not Indicated  Utilities Interventions Intervention Not Indicated        Care Coordination Interventions:  Yes, provided   Follow up plan: No further intervention required.   Encounter Outcome:  Pt. Visit Completed   Elliot Cousin, RN Care Management Coordinator [facility] Main Office [phone]

## 2022-05-31 DIAGNOSIS — M791 Myalgia, unspecified site: Secondary | ICD-10-CM | POA: Diagnosis not present

## 2022-05-31 DIAGNOSIS — R051 Acute cough: Secondary | ICD-10-CM | POA: Diagnosis not present

## 2022-05-31 DIAGNOSIS — J029 Acute pharyngitis, unspecified: Secondary | ICD-10-CM | POA: Diagnosis not present

## 2022-05-31 DIAGNOSIS — R519 Headache, unspecified: Secondary | ICD-10-CM | POA: Diagnosis not present

## 2022-06-03 ENCOUNTER — Encounter: Payer: Self-pay | Admitting: Family Medicine

## 2022-06-05 DIAGNOSIS — J209 Acute bronchitis, unspecified: Secondary | ICD-10-CM | POA: Diagnosis not present

## 2022-06-05 DIAGNOSIS — J019 Acute sinusitis, unspecified: Secondary | ICD-10-CM | POA: Diagnosis not present

## 2022-06-12 DIAGNOSIS — Z6822 Body mass index (BMI) 22.0-22.9, adult: Secondary | ICD-10-CM | POA: Diagnosis not present

## 2022-06-12 DIAGNOSIS — M5136 Other intervertebral disc degeneration, lumbar region: Secondary | ICD-10-CM | POA: Diagnosis not present

## 2022-06-12 DIAGNOSIS — M359 Systemic involvement of connective tissue, unspecified: Secondary | ICD-10-CM | POA: Diagnosis not present

## 2022-06-12 DIAGNOSIS — Z79899 Other long term (current) drug therapy: Secondary | ICD-10-CM | POA: Diagnosis not present

## 2022-06-12 DIAGNOSIS — M722 Plantar fascial fibromatosis: Secondary | ICD-10-CM | POA: Diagnosis not present

## 2022-06-12 DIAGNOSIS — M25511 Pain in right shoulder: Secondary | ICD-10-CM | POA: Diagnosis not present

## 2022-06-12 DIAGNOSIS — M797 Fibromyalgia: Secondary | ICD-10-CM | POA: Diagnosis not present

## 2022-06-12 DIAGNOSIS — M0579 Rheumatoid arthritis with rheumatoid factor of multiple sites without organ or systems involvement: Secondary | ICD-10-CM | POA: Diagnosis not present

## 2022-06-12 DIAGNOSIS — M21372 Foot drop, left foot: Secondary | ICD-10-CM | POA: Diagnosis not present

## 2022-06-21 ENCOUNTER — Other Ambulatory Visit (INDEPENDENT_AMBULATORY_CARE_PROVIDER_SITE_OTHER): Payer: Medicare Other

## 2022-06-21 DIAGNOSIS — E039 Hypothyroidism, unspecified: Secondary | ICD-10-CM | POA: Diagnosis not present

## 2022-06-21 LAB — TSH: TSH: 0.2 u[IU]/mL — ABNORMAL LOW (ref 0.35–5.50)

## 2022-06-24 ENCOUNTER — Telehealth: Payer: Self-pay

## 2022-06-24 NOTE — Telephone Encounter (Signed)
Lvm for patient to return my call regarding her lab results. She needs to schedule a 1 month LAB ONLY visit to repeat her TSH

## 2022-06-24 NOTE — Telephone Encounter (Signed)
-----  Message from Midge Minium, MD sent at 06/24/2022  7:21 AM EST ----- Your TSH is much closer to normal.  We will decrease your dose to 31mcg daily (#30, 3 refills) and repeat your TSH at a lab only visit in 1 month

## 2022-06-25 ENCOUNTER — Other Ambulatory Visit: Payer: Self-pay

## 2022-06-25 DIAGNOSIS — E039 Hypothyroidism, unspecified: Secondary | ICD-10-CM

## 2022-06-25 MED ORDER — LEVOTHYROXINE SODIUM 75 MCG PO TABS: 75.0000 ug | ORAL_TABLET | Freq: Every day | ORAL | 3 refills | Status: DC

## 2022-06-25 NOTE — Telephone Encounter (Signed)
Informed pt of lab results and she is scheduled for 07/22/22 for lab only visit . Lab orders TSH is in and levothyroxine 75 mcg has been sent to pharmacy as well

## 2022-07-16 ENCOUNTER — Other Ambulatory Visit: Payer: Self-pay | Admitting: Family Medicine

## 2022-07-16 DIAGNOSIS — R053 Chronic cough: Secondary | ICD-10-CM

## 2022-07-22 ENCOUNTER — Other Ambulatory Visit (INDEPENDENT_AMBULATORY_CARE_PROVIDER_SITE_OTHER): Payer: Medicare Other

## 2022-07-22 DIAGNOSIS — E039 Hypothyroidism, unspecified: Secondary | ICD-10-CM | POA: Diagnosis not present

## 2022-07-23 ENCOUNTER — Telehealth: Payer: Self-pay

## 2022-07-23 LAB — TSH: TSH: 1.05 u[IU]/mL (ref 0.35–5.50)

## 2022-07-23 NOTE — Telephone Encounter (Signed)
-----   Message from Midge Minium, MD sent at 07/23/2022 10:31 AM EST ----- TSH is now normal- great news!!  No med changes at this time

## 2022-07-23 NOTE — Telephone Encounter (Signed)
Informed pt of lab results  

## 2022-07-24 ENCOUNTER — Encounter: Payer: Self-pay | Admitting: Family Medicine

## 2022-07-24 ENCOUNTER — Ambulatory Visit (INDEPENDENT_AMBULATORY_CARE_PROVIDER_SITE_OTHER): Payer: Medicare Other | Admitting: Family Medicine

## 2022-07-24 VITALS — BP 104/80 | HR 91 | Temp 98.0°F | Resp 17 | Ht 61.0 in | Wt 131.1 lb

## 2022-07-24 DIAGNOSIS — M05711 Rheumatoid arthritis with rheumatoid factor of right shoulder without organ or systems involvement: Secondary | ICD-10-CM

## 2022-07-24 DIAGNOSIS — M2550 Pain in unspecified joint: Secondary | ICD-10-CM

## 2022-07-24 NOTE — Progress Notes (Unsigned)
   Subjective:    Patient ID: Becky Boyd, female    DOB: Oct 26, 1955, 67 y.o.   MRN: [id number]  HPI Pain- 'i hurt all the time.  I hurt everywhere.'.  Predominately low back, knees, hands, wrists.  Pt used to get hydrocodone from Dr Amil Amen- he no longer prescribes.  Used a compounded pain cream for her nonverbal grandson to determine if there were and side effects and had immediate relief.  Baclofen 5%, Diclofenac 3%, Gabapentin 10%, Ketamine 10%.  Apparently Clonidine is also in this formulation or that can be substituted w/ Lidocaine-Prilocaine cream.  Pt is asking if I would prescribe this compound to a local pharmacy.   Review of Systems For ROS see HPI     Objective:   Physical Exam Vitals reviewed.  Constitutional:      General: She is not in acute distress.    Appearance: Normal appearance. She is not ill-appearing.  HENT:     Head: Normocephalic and atraumatic.  Cardiovascular:     Pulses: Normal pulses.  Skin:    General: Skin is warm and dry.  Neurological:     Mental Status: She is alert and oriented to person, place, and time. Mental status is at baseline.     Gait: Gait abnormal (stiff, antalgic gait).  Psychiatric:        Mood and Affect: Mood normal.        Behavior: Behavior normal.        Thought Content: Thought content normal.           Assessment & Plan:   RA/polyarthralgia- deteriorated.  Pt reports pain is worsening and Dr Amil Amen is no longer providing narcotic pain medication.  She is trying to avoid going to pain management as she feels she has enough appts already.  Asking if we can send a script for compounded pain cream.  I called [facility] in [place] to see if her specific combination is something they can do and she agreed this was possible.  Asked me to fax script so they can reach out to pt to discuss pricing.  Pt was given a hard copy and this was faxed to [place].  Pt appreciative.

## 2022-07-24 NOTE — Patient Instructions (Addendum)
Follow up as needed or as scheduled We faxed your prescription to [facility] on [address] (I spoke w/ them directly and know they can do it!) They will call you to go over pricing and when it will be available When you pick it up, take the paper prescription with you Call with any questions or concerns Hang in there!!!

## 2022-08-06 ENCOUNTER — Other Ambulatory Visit: Payer: Self-pay | Admitting: Family Medicine

## 2022-08-17 ENCOUNTER — Other Ambulatory Visit: Payer: Self-pay | Admitting: Family Medicine

## 2022-09-10 ENCOUNTER — Other Ambulatory Visit: Payer: Self-pay | Admitting: Family Medicine

## 2022-09-10 NOTE — Telephone Encounter (Signed)
Is this ok to refill?  

## 2022-09-26 ENCOUNTER — Ambulatory Visit: Payer: Medicare Other | Admitting: Adult Health

## 2022-09-26 ENCOUNTER — Encounter: Payer: Self-pay | Admitting: Adult Health

## 2022-09-26 VITALS — BP 121/74 | HR 93 | Ht 62.0 in | Wt 134.2 lb

## 2022-09-26 DIAGNOSIS — R413 Other amnesia: Secondary | ICD-10-CM

## 2022-09-26 NOTE — Progress Notes (Signed)
PATIENT: Becky Boyd DOB: 1955/11/11  REASON FOR VISIT: follow up HISTORY FROM: patient PRIMARY NEUROLOGIST: Dr. Lucia Gaskins   Chief Complaint  Patient presents with   Follow-up    Pt in 4 pt here for Memory loss f/u Pt states short term memory is worse      HISTORY OF PRESENT ILLNESS: Today 09/26/22  Becky Boyd is a 67 y.o. female who has been followed in this office for memory disturbance, abnormal MRI with progressive white matter changes. Returns today for follow-up. Feels that memory is worse.  Continues to have problems with short-term memory lives at home with her husband. Able to complete all ADLs independently.  Not able to do household chores due to back pain. Did not schedule repeat neuropsychiatry testing and she cancelled her sleep consult. States that she just didn't want to do it.   HISTORY Headaches, abnormal white matter changes on MRI brain progressive with new neurologic symptoms. She has a PMHx of sjogren's, sicca syndrome, RA, fibromyalgia, hypothyroidism, lupus     03/11/2022: Patient is here for follow up, she was transitioned to [facility]. I reviewed notes from [facility] and she was evaluated with no evidence of demyelinating or inflammatory disease to cause her extensive white matter changes. Repeat formal neurocognitive testing was recommended, eeg was recommended as well as testing for CADASIL but I believe this is unlikely. We discussed small vessel disease and causes of memory loss and other causes. There has been progression. She is always tired and snores. They feel memory Is worse. We discussed white matter changes and chronic microvascular ischmia, I gave them literature to read.  REVIEW OF SYSTEMS: Out of a complete 14 system review of symptoms, the patient complains only of the following symptoms, and all other reviewed systems are negative.  ALLERGIES: Allergies  Allergen Reactions   Plaquenil [Hydroxychloroquine Sulfate] Other (See Comments)     Prolonged QT interval, skin feels like bee stings    HOME MEDICATIONS: Outpatient Medications Prior to Visit  Medication Sig Dispense Refill   aspirin (ASPIRIN 81) 81 MG chewable tablet Chew by mouth daily.     atorvastatin (LIPITOR) 20 MG tablet TAKE ONE TABLET BY MOUTH ONE TIME DAILY 90 tablet 0   baclofen (LIORESAL) 10 MG tablet TAKE ONE TABLET BY MOUTH THREE TIMES DAILY 30 tablet 0   Carboxymethylcellul-Glycerin (LUBRICATING EYE DROPS OP) Place 1 drop into both eyes daily as needed (dry eyes).     DULoxetine (CYMBALTA) 60 MG capsule Take 60 mg by mouth at bedtime.     levothyroxine (SYNTHROID) 75 MCG tablet Take 1 tablet (75 mcg total) by mouth daily. 30 tablet 3   meloxicam (MOBIC) 7.5 MG tablet Take 7.5 mg by mouth daily.     pantoprazole (PROTONIX) 40 MG tablet TAKE ONE TABLET BY MOUTH ONE TIME DAILY 90 tablet 0   Potassium Citrate 15 MEQ (1620 MG) TBCR Take 2 tablets by mouth 2 (two) times daily.     predniSONE (DELTASONE) 5 MG tablet Take 5 mg by mouth daily.     sulfaSALAzine (AZULFIDINE) 500 MG tablet Take 1,000 mg by mouth 2 (two) times daily.     levothyroxine (SYNTHROID) 100 MCG tablet Take 1 tablet (100 mcg total) by mouth daily. 30 tablet 3   No facility-administered medications prior to visit.    PAST MEDICAL HISTORY: Past Medical History:  Diagnosis Date   Aneurysm    Anorexia    COVID    Dysphagia    Fibromyalgia  Fibromyalgia    GERD (gastroesophageal reflux disease)    Hashimoto thyroiditis    History of kidney stones    hx of multiple kidney stones    Hypothyroidism    Kidney stones    Lupus    Palpitations    Rheumatoid arthritis    Sjogren's disease     PAST SURGICAL HISTORY: Past Surgical History:  Procedure Laterality Date   ABDOMINAL HYSTERECTOMY     CERVICAL FUSION  2004   C4-C5 with Instrumentation   CERVICAL FUSION  1990   C3-C4   cervical rods     CHOLECYSTECTOMY     kidney stone     l5-s1 surgery     LUMBAR MICRODISCECTOMY  2004    Bilateral L5-S1   TOTAL KNEE ARTHROPLASTY Left 06/09/2020   Procedure: TOTAL KNEE ARTHROPLASTY;  Surgeon: Jene Every, MD;  Location: [facility];  Service: Orthopedics;  Laterality: Left;    FAMILY HISTORY: Family History  Problem Relation Age of Onset   COPD Mother    Heart disease Father    Diabetes Sister    COPD Brother    Breast cancer Paternal Aunt    Aneurysm Paternal Aunt    Diabetes Other    Dementia Neg Hx    Alzheimer's disease Neg Hx     SOCIAL HISTORY: Social History   Socioeconomic History   Marital status: Married    Spouse name: Not on file   Number of children: 2   Years of education: Not on file   Highest education level: High school graduate  Occupational History   Not on file  Tobacco Use   Smoking status: Never   Smokeless tobacco: Never  Vaping Use   Vaping Use: Never used  Substance and Sexual Activity   Alcohol use: Never   Drug use: Never   Sexual activity: Not on file  Other Topics Concern   Not on file  Social History Narrative   Lives at home with her husband   Right handed   Caffeine: occasional pepsi or mtn dew   Social Determinants of Health   Financial Resource Strain: Low Risk  (01/17/2022)   Overall Financial Resource Strain (CARDIA)    Difficulty of Paying Living Expenses: Not hard at all  Food Insecurity: No Food Insecurity (05/30/2022)   Hunger Vital Sign    Worried About Running Out of Food in the Last Year: Never true    Ran Out of Food in the Last Year: Never true  Transportation Needs: No Transportation Needs (05/30/2022)   PRAPARE - Administrator, Civil Service (Medical): No    Lack of Transportation (Non-Medical): No  Physical Activity: Insufficiently Active (01/17/2022)   Exercise Vital Sign    Days of Exercise per Week: 3 days    Minutes of Exercise per Session: 30 min  Stress: No Stress Concern Present (01/17/2022)   [employer] of Occupational Health - Occupational Stress Questionnaire     Feeling of Stress : Not at all  Social Connections: Moderately Integrated (01/17/2022)   Social Connection and Isolation Panel [NHANES]    Frequency of Communication with Friends and Family: Three times a week    Frequency of Social Gatherings with Friends and Family: Three times a week    Attends Religious Services: 1 to 4 times per year    Active Member of Clubs or Organizations: No    Attends Banker Meetings: Never    Marital Status: Married  Catering manager Violence: Not  At Risk (01/17/2022)   Humiliation, Afraid, Rape, and Kick questionnaire    Fear of Current or Ex-Partner: No    Emotionally Abused: No    Physically Abused: No    Sexually Abused: No      PHYSICAL EXAM  Vitals:   09/26/22 1514  BP: 121/74  Pulse: 93  Weight: 134 lb 3.2 oz (60.9 kg)  Height: 5\' 2"  (1.575 m)   Body mass index is 24.55 kg/m.     09/26/2022    3:17 PM 09/26/2022    3:15 PM 03/11/2022   11:07 AM  MMSE - Mini Mental State Exam  Orientation to time 5 5 5   Orientation to Place 4 4 4   Registration 3  3  Attention/ Calculation 5  2  Recall 3  3  Language- name 2 objects 2  2  Language- repeat 1  1  Language- follow 3 step command 2  3  Language- read & follow direction 1  1  Write a sentence 1  1  Copy design 1  0  Total score 28  25       No data to display            Generalized: Well developed, in no acute distress   Neurological examination  Mentation: Alert oriented to time, place, history taking. Follows all commands speech and language fluent Cranial nerve II-XII: Pupils were equal round reactive to light. Extraocular movements were full, visual field were full on confrontational test. Facial sensation and strength were normal.  Head turning and shoulder shrug  were normal and symmetric. Motor: The motor testing reveals 5 over 5 strength of all 4 extremities. Good symmetric motor tone is noted throughout.  Sensory: Sensory testing is intact to soft touch on  all 4 extremities. No evidence of extinction is noted.  Coordination: Cerebellar testing reveals good finger-nose-finger and heel-to-shin bilaterally.  Gait and station: Gait is normal.  Reflexes: Deep tendon reflexes are symmetric and normal bilaterally.   DIAGNOSTIC DATA (LABS, IMAGING, TESTING) - I reviewed patient records, labs, notes, testing and imaging myself where available.  Lab Results  Component Value Date   WBC 9.0 05/03/2021   HGB 12.0 05/03/2021   HCT 36.3 05/03/2021   MCV 91 05/03/2021   PLT 261 05/03/2021      Component Value Date/Time   NA 140 04/23/2022 1541   NA 141 05/03/2021 1611   K 3.9 04/23/2022 1541   CL 102 04/23/2022 1541   CO2 30 04/23/2022 1541   GLUCOSE 99 04/23/2022 1541   BUN 13 04/23/2022 1541   BUN 13 05/03/2021 1611   CREATININE 1.05 04/23/2022 1541   CREATININE 0.91 08/11/2020 1450   CALCIUM 9.1 04/23/2022 1541   PROT 7.6 03/19/2022 1033   PROT 7.7 05/03/2021 1611   ALBUMIN 4.5 03/19/2022 1033   ALBUMIN 4.5 05/17/2021 1205   AST 19 03/19/2022 1033   ALT 13 03/19/2022 1033   ALKPHOS 108 03/19/2022 1033   BILITOT 0.4 03/19/2022 1033   BILITOT 0.3 05/03/2021 1611   GFRNONAA >60 06/14/2020 0530   GFRAA >60 10/01/2018 0517   Lab Results  Component Value Date   CHOL 162 03/18/2022   HDL 65 03/18/2022   LDLCALC 77 03/18/2022   LDLDIRECT 78.0 02/17/2020   TRIG 113 03/18/2022   CHOLHDL 2.5 03/18/2022   Lab Results  Component Value Date   HGBA1C 4.9 03/18/2022   Lab Results  Component Value Date   VITAMINB12 491 05/03/2021   Lab  Results  Component Value Date   TSH 1.05 07/22/2022      ASSESSMENT AND PLAN 67 y.o. year old female  has a past medical history of Aneurysm, Anorexia, COVID, Dysphagia, Fibromyalgia, Fibromyalgia, GERD (gastroesophageal reflux disease), Hashimoto thyroiditis, History of kidney stones, Hypothyroidism, Kidney stones, Lupus, Palpitations, Rheumatoid arthritis, and Sjogren's disease. here with:  Memory  disturbance  - MMSE 28/30 - Recommended repeat neuropsychological testing - Also recommended rescheduling sleep evaluation - Follow-up after neuropsychological testing     Butch Penny, MSN, NP-C 09/26/2022, 3:18 PM [facility] [address], [address] [place], [place] [address] [phone]

## 2022-09-30 ENCOUNTER — Telehealth: Payer: Self-pay | Admitting: Adult Health

## 2022-09-30 NOTE — Telephone Encounter (Signed)
Neuropsychology referral faxed to [facility] for Dr. Clayborn Heron (fax# [phone], phone# [phone])

## 2022-10-09 DIAGNOSIS — M359 Systemic involvement of connective tissue, unspecified: Secondary | ICD-10-CM | POA: Diagnosis not present

## 2022-10-09 DIAGNOSIS — M722 Plantar fascial fibromatosis: Secondary | ICD-10-CM | POA: Diagnosis not present

## 2022-10-09 DIAGNOSIS — Z6824 Body mass index (BMI) 24.0-24.9, adult: Secondary | ICD-10-CM | POA: Diagnosis not present

## 2022-10-09 DIAGNOSIS — M0579 Rheumatoid arthritis with rheumatoid factor of multiple sites without organ or systems involvement: Secondary | ICD-10-CM | POA: Diagnosis not present

## 2022-10-09 DIAGNOSIS — M797 Fibromyalgia: Secondary | ICD-10-CM | POA: Diagnosis not present

## 2022-10-09 DIAGNOSIS — Z79899 Other long term (current) drug therapy: Secondary | ICD-10-CM | POA: Diagnosis not present

## 2022-10-09 DIAGNOSIS — M21372 Foot drop, left foot: Secondary | ICD-10-CM | POA: Diagnosis not present

## 2022-10-09 DIAGNOSIS — M5136 Other intervertebral disc degeneration, lumbar region: Secondary | ICD-10-CM | POA: Diagnosis not present

## 2022-10-09 DIAGNOSIS — E538 Deficiency of other specified B group vitamins: Secondary | ICD-10-CM | POA: Diagnosis not present

## 2022-10-09 DIAGNOSIS — R252 Cramp and spasm: Secondary | ICD-10-CM | POA: Diagnosis not present

## 2022-10-09 DIAGNOSIS — M25511 Pain in right shoulder: Secondary | ICD-10-CM | POA: Diagnosis not present

## 2022-10-11 DIAGNOSIS — M25562 Pain in left knee: Secondary | ICD-10-CM | POA: Diagnosis not present

## 2022-10-11 DIAGNOSIS — M1711 Unilateral primary osteoarthritis, right knee: Secondary | ICD-10-CM | POA: Diagnosis not present

## 2022-10-12 ENCOUNTER — Other Ambulatory Visit: Payer: Self-pay | Admitting: Family Medicine

## 2022-10-12 DIAGNOSIS — R053 Chronic cough: Secondary | ICD-10-CM

## 2022-10-18 ENCOUNTER — Other Ambulatory Visit: Payer: Self-pay | Admitting: Family Medicine

## 2022-10-29 ENCOUNTER — Other Ambulatory Visit: Payer: Self-pay | Admitting: Family Medicine

## 2022-10-29 ENCOUNTER — Other Ambulatory Visit: Payer: Self-pay

## 2022-10-29 DIAGNOSIS — E039 Hypothyroidism, unspecified: Secondary | ICD-10-CM

## 2022-11-22 ENCOUNTER — Other Ambulatory Visit: Payer: Self-pay | Admitting: Family Medicine

## 2022-11-26 ENCOUNTER — Other Ambulatory Visit: Payer: Self-pay | Admitting: Family Medicine

## 2022-11-26 DIAGNOSIS — M25552 Pain in left hip: Secondary | ICD-10-CM | POA: Diagnosis not present

## 2022-11-26 DIAGNOSIS — M1711 Unilateral primary osteoarthritis, right knee: Secondary | ICD-10-CM | POA: Diagnosis not present

## 2022-11-26 DIAGNOSIS — M25551 Pain in right hip: Secondary | ICD-10-CM | POA: Diagnosis not present

## 2022-11-26 DIAGNOSIS — Z96652 Presence of left artificial knee joint: Secondary | ICD-10-CM | POA: Diagnosis not present

## 2022-11-26 DIAGNOSIS — M961 Postlaminectomy syndrome, not elsewhere classified: Secondary | ICD-10-CM | POA: Diagnosis not present

## 2022-11-26 DIAGNOSIS — M0579 Rheumatoid arthritis with rheumatoid factor of multiple sites without organ or systems involvement: Secondary | ICD-10-CM | POA: Diagnosis not present

## 2022-11-26 DIAGNOSIS — M79605 Pain in left leg: Secondary | ICD-10-CM | POA: Diagnosis not present

## 2022-11-26 DIAGNOSIS — Z79899 Other long term (current) drug therapy: Secondary | ICD-10-CM | POA: Diagnosis not present

## 2022-11-26 DIAGNOSIS — M79604 Pain in right leg: Secondary | ICD-10-CM | POA: Diagnosis not present

## 2022-11-26 DIAGNOSIS — E039 Hypothyroidism, unspecified: Secondary | ICD-10-CM

## 2022-11-26 DIAGNOSIS — Q761 Klippel-Feil syndrome: Secondary | ICD-10-CM | POA: Diagnosis not present

## 2022-11-26 DIAGNOSIS — M3500 Sicca syndrome, unspecified: Secondary | ICD-10-CM | POA: Diagnosis not present

## 2022-11-26 DIAGNOSIS — Z6824 Body mass index (BMI) 24.0-24.9, adult: Secondary | ICD-10-CM | POA: Diagnosis not present

## 2022-11-27 ENCOUNTER — Other Ambulatory Visit: Payer: Self-pay | Admitting: Family Medicine

## 2022-11-28 ENCOUNTER — Other Ambulatory Visit: Payer: Self-pay | Admitting: Family Medicine

## 2022-12-09 DIAGNOSIS — Z96652 Presence of left artificial knee joint: Secondary | ICD-10-CM | POA: Diagnosis not present

## 2022-12-09 DIAGNOSIS — Z0000 Encounter for general adult medical examination without abnormal findings: Secondary | ICD-10-CM | POA: Diagnosis not present

## 2022-12-09 DIAGNOSIS — M1711 Unilateral primary osteoarthritis, right knee: Secondary | ICD-10-CM | POA: Diagnosis not present

## 2022-12-09 DIAGNOSIS — M3500 Sicca syndrome, unspecified: Secondary | ICD-10-CM | POA: Diagnosis not present

## 2022-12-09 DIAGNOSIS — Z6824 Body mass index (BMI) 24.0-24.9, adult: Secondary | ICD-10-CM | POA: Diagnosis not present

## 2022-12-09 DIAGNOSIS — Z79899 Other long term (current) drug therapy: Secondary | ICD-10-CM | POA: Diagnosis not present

## 2022-12-09 DIAGNOSIS — Q761 Klippel-Feil syndrome: Secondary | ICD-10-CM | POA: Diagnosis not present

## 2022-12-09 DIAGNOSIS — M0579 Rheumatoid arthritis with rheumatoid factor of multiple sites without organ or systems involvement: Secondary | ICD-10-CM | POA: Diagnosis not present

## 2022-12-09 DIAGNOSIS — M961 Postlaminectomy syndrome, not elsewhere classified: Secondary | ICD-10-CM | POA: Diagnosis not present

## 2023-01-01 ENCOUNTER — Encounter (INDEPENDENT_AMBULATORY_CARE_PROVIDER_SITE_OTHER): Payer: Self-pay

## 2023-01-08 DIAGNOSIS — M961 Postlaminectomy syndrome, not elsewhere classified: Secondary | ICD-10-CM | POA: Diagnosis not present

## 2023-01-08 DIAGNOSIS — Z79899 Other long term (current) drug therapy: Secondary | ICD-10-CM | POA: Diagnosis not present

## 2023-01-08 DIAGNOSIS — M3500 Sicca syndrome, unspecified: Secondary | ICD-10-CM | POA: Diagnosis not present

## 2023-01-08 DIAGNOSIS — Z6824 Body mass index (BMI) 24.0-24.9, adult: Secondary | ICD-10-CM | POA: Diagnosis not present

## 2023-01-08 DIAGNOSIS — Z96652 Presence of left artificial knee joint: Secondary | ICD-10-CM | POA: Diagnosis not present

## 2023-01-08 DIAGNOSIS — M1711 Unilateral primary osteoarthritis, right knee: Secondary | ICD-10-CM | POA: Diagnosis not present

## 2023-01-08 DIAGNOSIS — M0579 Rheumatoid arthritis with rheumatoid factor of multiple sites without organ or systems involvement: Secondary | ICD-10-CM | POA: Diagnosis not present

## 2023-01-08 DIAGNOSIS — Q761 Klippel-Feil syndrome: Secondary | ICD-10-CM | POA: Diagnosis not present

## 2023-01-10 ENCOUNTER — Other Ambulatory Visit: Payer: Self-pay | Admitting: Family Medicine

## 2023-01-10 DIAGNOSIS — R053 Chronic cough: Secondary | ICD-10-CM

## 2023-01-23 ENCOUNTER — Ambulatory Visit (INDEPENDENT_AMBULATORY_CARE_PROVIDER_SITE_OTHER): Payer: Medicare Other | Admitting: *Deleted

## 2023-01-23 DIAGNOSIS — Z0000 Encounter for general adult medical examination without abnormal findings: Secondary | ICD-10-CM | POA: Diagnosis not present

## 2023-01-23 NOTE — Progress Notes (Signed)
Subjective:   Becky Boyd is a 67 y.o. female who presents for Medicare Annual (Subsequent) preventive examination.  Visit Complete: Virtual  I connected with  Becky Boyd on 01/23/23 by a audio enabled telemedicine application and verified that I am speaking with the correct person using two identifiers.  Patient Location: Home  Provider Location: Home Office  I discussed the limitations of evaluation and management by telemedicine. The patient expressed understanding and agreed to proceed.  Vital Signs: Unable to obtain new vitals due to this being a telehealth visit.   Review of Systems     Cardiac Risk Factors include: advanced age (>77men, >18 women);family history of premature cardiovascular disease     Objective:    Today's Vitals   01/23/23 1432  PainSc: 5    There is no height or weight on file to calculate BMI.     01/17/2022   10:16 AM 06/11/2020   10:00 AM 06/10/2020    8:47 AM 06/09/2020   11:00 PM 06/06/2020    1:20 PM 09/17/2018   11:00 PM 09/17/2018    3:24 PM  Advanced Directives  Does Patient Have a Medical Advance Directive? Yes  Yes Yes Yes Yes Yes  Type of Estate agent of [place];Living will Healthcare Power of [place];Living will  Healthcare Power of [place];Living will Healthcare Power of [place];Living will Healthcare Power of [employer] Power of Attorney  Does patient want to make changes to medical advance directive?  No - Patient declined    No - Patient declined   Copy of Healthcare Power of Attorney in Chart? No - copy requested     No - copy requested No - copy requested    Current Medications (verified) Outpatient Encounter Medications as of 01/23/2023  Medication Sig   aspirin (ASPIRIN 81) 81 MG chewable tablet Chew by mouth daily.   atorvastatin (LIPITOR) 20 MG tablet TAKE ONE TABLET BY MOUTH ONE TIME DAILY   baclofen (LIORESAL) 10 MG tablet TAKE ONE TABLET BY MOUTH THREE TIMES DAILY    Carboxymethylcellul-Glycerin (LUBRICATING EYE DROPS OP) Place 1 drop into both eyes daily as needed (dry eyes).   DULoxetine (CYMBALTA) 60 MG capsule Take 60 mg by mouth at bedtime.   levothyroxine (SYNTHROID) 75 MCG tablet TAKE ONE TABLET BY MOUTH ONCE A DAY   meloxicam (MOBIC) 7.5 MG tablet Take 7.5 mg by mouth daily.   pantoprazole (PROTONIX) 40 MG tablet TAKE ONE TABLET BY MOUTH ONCE A DAY   Potassium Citrate 15 MEQ (1620 MG) TBCR Take 2 tablets by mouth 2 (two) times daily.   predniSONE (DELTASONE) 5 MG tablet Take 5 mg by mouth daily.   sulfaSALAzine (AZULFIDINE) 500 MG tablet Take 1,000 mg by mouth 2 (two) times daily.   No facility-administered encounter medications on file as of 01/23/2023.    Allergies (verified) Plaquenil [hydroxychloroquine sulfate]   History: Past Medical History:  Diagnosis Date   Aneurysm (HCC)    Anorexia    COVID    Dysphagia    Fibromyalgia    Fibromyalgia    GERD (gastroesophageal reflux disease)    Hashimoto thyroiditis    History of kidney stones    hx of multiple kidney stones    Hypothyroidism    Kidney stones    Lupus (HCC)    Palpitations    Rheumatoid arthritis (HCC)    Sjogren's disease (HCC)    Past Surgical History:  Procedure Laterality Date   ABDOMINAL HYSTERECTOMY     CERVICAL  FUSION  2004   C4-C5 with Instrumentation   CERVICAL FUSION  1990   C3-C4   cervical rods     CHOLECYSTECTOMY     kidney stone     l5-s1 surgery     LUMBAR MICRODISCECTOMY  2004   Bilateral L5-S1   TOTAL KNEE ARTHROPLASTY Left 06/09/2020   Procedure: TOTAL KNEE ARTHROPLASTY;  Surgeon: Jene Every, MD;  Location: [facility];  Service: Orthopedics;  Laterality: Left;   Family History  Problem Relation Age of Onset   COPD Mother    Heart disease Father    Diabetes Sister    COPD Brother    Breast cancer Paternal Aunt    Aneurysm Paternal Aunt    Diabetes Other    Dementia Neg Hx    Alzheimer's disease Neg Hx    Social History    Socioeconomic History   Marital status: Married    Spouse name: Not on file   Number of children: 2   Years of education: Not on file   Highest education level: High school graduate  Occupational History   Not on file  Tobacco Use   Smoking status: Never   Smokeless tobacco: Never  Vaping Use   Vaping status: Never Used  Substance and Sexual Activity   Alcohol use: Never   Drug use: Never   Sexual activity: Not Currently  Other Topics Concern   Not on file  Social History Narrative   Lives at home with her husband   Right handed   Caffeine: occasional pepsi or mtn dew   Social Determinants of Health   Financial Resource Strain: Low Risk  (01/23/2023)   Overall Financial Resource Strain (CARDIA)    Difficulty of Paying Living Expenses: Not hard at all  Food Insecurity: No Food Insecurity (01/23/2023)   Hunger Vital Sign    Worried About Running Out of Food in the Last Year: Never true    Ran Out of Food in the Last Year: Never true  Transportation Needs: No Transportation Needs (01/23/2023)   PRAPARE - Administrator, Civil Service (Medical): No    Lack of Transportation (Non-Medical): No  Physical Activity: Inactive (01/23/2023)   Exercise Vital Sign    Days of Exercise per Week: 0 days    Minutes of Exercise per Session: 0 min  Stress: No Stress Concern Present (01/23/2023)   [employer] of Occupational Health - Occupational Stress Questionnaire    Feeling of Stress : Not at all  Social Connections: Moderately Isolated (01/23/2023)   Social Connection and Isolation Panel [NHANES]    Frequency of Communication with Friends and Family: More than three times a week    Frequency of Social Gatherings with Friends and Family: More than three times a week    Attends Religious Services: Never    Database administrator or Organizations: No    Attends Engineer, structural: Never    Marital Status: Married    Tobacco Counseling Counseling given:  Not Answered   Clinical Intake:  Pre-visit preparation completed: Yes  Pain : 0-10 Pain Score: 5  Pain Type: Chronic pain Pain Location:  (all over) Pain Descriptors / Indicators: Constant, Burning, Aching, Dull Pain Onset: More than a month ago Pain Frequency: Constant Pain Relieving Factors: hydrocodone/tylenol  Pain Relieving Factors: hydrocodone/tylenol  Diabetes: No  How often do you need to have someone help you when you read instructions, pamphlets, or other written materials from your doctor or pharmacy?: 1 -  Never  Interpreter Needed?: No  Information entered by :: Remi Haggard LPN   Activities of Daily Living    01/23/2023    2:35 PM  In your present state of health, do you have any difficulty performing the following activities:  Hearing? 0  Vision? 0  Difficulty concentrating or making decisions? 0  Walking or climbing stairs? 0  Dressing or bathing? 0  Doing errands, shopping? 0  Preparing Food and eating ? N  Using the Toilet? N  In the past six months, have you accidently leaked urine? N  Do you have problems with loss of bowel control? N  Managing your Medications? N  Managing your Finances? N  Housekeeping or managing your Housekeeping? N    Patient Care Team: Sheliah Hatch, MD as PCP - General (Family Medicine) Donnetta Hail, MD as Consulting Physician (Rheumatology) Anson Fret, MD as Consulting Physician (Neurology)  Indicate any recent Medical Services you may have received from other than [facility] providers in the past year (date may be approximate).     Assessment:   This is a routine wellness examination for .  Hearing/Vision screen Hearing Screening - Comments:: No trouble hearing Vision Screening - Comments:: Up to date [facility]  Dietary issues and exercise activities discussed:     Goals Addressed             This Visit's Progress    Patient Stated       Continue current lifestyle       Depression  Screen    01/23/2023    2:39 PM 03/19/2022   10:13 AM 01/17/2022   10:17 AM 01/17/2022   10:14 AM 05/21/2021   11:13 AM 02/19/2021   11:46 AM 07/13/2020    2:07 PM  PHQ 2/9 Scores  PHQ - 2 Score 0 0 0 0 0 0 0  PHQ- 9 Score 3 1   6 2  0    Fall Risk    01/23/2023    2:34 PM 07/24/2022    1:56 PM 03/19/2022   10:13 AM 01/17/2022   10:17 AM 05/21/2021   11:13 AM  Fall Risk   Falls in the past year? 1 1 0 0 1  Number falls in past yr: 1 0  0 0  Injury with Fall? 0 0  0 0  Risk for fall due to :  History of fall(s) No Fall Risks  History of fall(s)  Follow up Falls evaluation completed;Education provided;Falls prevention discussed Falls evaluation completed Falls evaluation completed Falls evaluation completed;Education provided Falls evaluation completed    MEDICARE RISK AT HOME: Medicare Risk at Home Any stairs in or around the home?: Yes If so, are there any without handrails?: No Home free of loose throw rugs in walkways, pet beds, electrical cords, etc?: Yes Adequate lighting in your home to reduce risk of falls?: Yes Life alert?: No Use of a cane, walker or w/c?: No Grab bars in the bathroom?: No Shower chair or bench in shower?: No Elevated toilet seat or a handicapped toilet?: No  TIMED UP AND GO:  Was the test performed?  No    Cognitive Function:    09/26/2022    3:17 PM 09/26/2022    3:15 PM 03/11/2022   11:07 AM 05/03/2021    4:19 PM 10/08/2017   12:52 PM  MMSE - Mini Mental State Exam  Orientation to time 5 5 5 5 4   Orientation to Place 4 4 4 5 5   Registration  3  3 3 3   Attention/ Calculation 5  2 4 5   Recall 3  3 2 3   Language- name 2 objects 2  2 2 2   Language- repeat 1  1 1 1   Language- follow 3 step command 2  3 3 3   Language- read & follow direction 1  1 1 1   Write a sentence 1  1 1 1   Copy design 1  0 1 1  Total score 28  25 28 29         01/23/2023    2:41 PM 01/17/2022   10:27 AM  6CIT Screen  What Year? 0 points 0 points  What month? 0 points  0 points  What time? 0 points 0 points  Count back from 20 0 points 0 points  Months in reverse 0 points 0 points  Repeat phrase 0 points 0 points  Total Score 0 points 0 points    Immunizations Immunization History  Administered Date(s) Administered   COVID-19, mRNA, vaccine(Comirnaty)12 years and older 05/23/2022   Fluad Quad(high Dose 65+) 02/17/2020   Influenza, Seasonal, Injecte, Preservative Fre 07/04/2016   Influenza,inj,Quad PF,6+ Mos 04/22/2013, 04/01/2014, 01/27/2017, 03/04/2019   Influenza-Unspecified 03/31/2021   Moderna Covid-19 Vaccine Bivalent Booster 34yrs & up 05/23/2022   Moderna SARS-COV2 Booster Vaccination 09/21/2020, 03/31/2021   Moderna Sars-Covid-2 Vaccination 07/09/2019, 08/05/2019, 04/28/2020   Zoster Recombinant(Shingrix) 02/19/2021    TDAP status: Due, Education has been provided regarding the importance of this vaccine. Advised may receive this vaccine at local pharmacy or Health Dept. Aware to provide a copy of the vaccination record if obtained from local pharmacy or Health Dept. Verbalized acceptance and understanding.  Flu Vaccine status: Due, Education has been provided regarding the importance of this vaccine. Advised may receive this vaccine at local pharmacy or Health Dept. Aware to provide a copy of the vaccination record if obtained from local pharmacy or Health Dept. Verbalized acceptance and understanding.  Pneumococcal vaccine status: Due, Education has been provided regarding the importance of this vaccine. Advised may receive this vaccine at local pharmacy or Health Dept. Aware to provide a copy of the vaccination record if obtained from local pharmacy or Health Dept. Verbalized acceptance and understanding.  Covid-19 vaccine status: Information provided on how to obtain vaccines.   Qualifies for Shingles Vaccine? Yes   Zostavax completed No   Shingrix Completed?: No.    Education has been provided regarding the importance of this vaccine.  Patient has been advised to call insurance company to determine out of pocket expense if they have not yet received this vaccine. Advised may also receive vaccine at local pharmacy or Health Dept. Verbalized acceptance and understanding.  Screening Tests Health Maintenance  Topic Date Due   MAMMOGRAM  Never done   INFLUENZA VACCINE  01/02/2023   Pneumonia Vaccine 78+ Years old (1 of 1 - PCV) 03/20/2023 (Originally 12/24/2018)   Medicare Annual Wellness (AWV)  01/23/2024   Fecal DNA (Cologuard)  09/04/2024   DEXA SCAN  Completed   Hepatitis C Screening  Completed   HPV VACCINES  Aged Out   DTaP/Tdap/Td  Discontinued   Colonoscopy  Discontinued   COVID-19 Vaccine  Discontinued   Zoster Vaccines- Shingrix  Discontinued    Health Maintenance  Health Maintenance Due  Topic Date Due   MAMMOGRAM  Never done   INFLUENZA VACCINE  01/02/2023    Colorectal cancer screening: Type of screening: Cologuard. Completed 2023. Repeat every 3 years  Mammogram was unable to see  everytime she went patient stated she stopped having due to implants  Gynecologists sent for MRI still said it was dense   Bone Density status: Completed 2023. Results reflect: Bone density results: NORMAL. Repeat every 0 years.  Lung Cancer Screening: (Low Dose CT Chest recommended if Age 2-80 years, 20 pack-year currently smoking OR have quit w/in 15years.) does not qualify.   Lung Cancer Screening Referral:   Additional Screening:  Hepatitis C Screening: does not qualify; Completed 2022  Vision Screening: Recommended annual ophthalmology exams for early detection of glaucoma and other disorders of the eye. Is the patient up to date with their annual eye exam?  Yes  Who is the provider or what is the name of the office in which the patient attends annual eye exams? [facility] If pt is not established with a provider, would they like to be referred to a provider to establish care? No .   Dental Screening: Recommended  annual dental exams for proper oral hygiene    Community Resource Referral / Chronic Care Management: CRR required this visit?  No   CCM required this visit?  No     Plan:     I have personally reviewed and noted the following in the patient's chart:   Medical and social history Use of alcohol, tobacco or illicit drugs  Current medications and supplements including opioid prescriptions. Patient is currently taking opioid prescriptions. Information provided to patient regarding non-opioid alternatives. Patient advised to discuss non-opioid treatment plan with their provider. Functional ability and status Nutritional status Physical activity Advanced directives List of other physicians Hospitalizations, surgeries, and ER visits in previous 12 months Vitals Screenings to include cognitive, depression, and falls Referrals and appointments  In addition, I have reviewed and discussed with patient certain preventive protocols, quality metrics, and best practice recommendations. A written personalized care plan for preventive services as well as general preventive health recommendations were provided to patient.     Remi Haggard, LPN   1/61/0960   After Visit Summary: (MyChart) Due to this being a telephonic visit, the after visit summary with patients personalized plan was offered to patient via MyChart   Nurse Notes:

## 2023-01-24 ENCOUNTER — Other Ambulatory Visit: Payer: Self-pay | Admitting: Family Medicine

## 2023-01-24 NOTE — Telephone Encounter (Signed)
Baclofen 10 mg Requested Prescriptions   Pending Prescriptions Disp Refills   baclofen (LIORESAL) 10 MG tablet [Pharmacy Med Name: Baclofen Oral Tablet 10 MG] 30 tablet 0    Sig: TAKE ONE TABLET BY MOUTH THREE TIMES DAILY     Date of patient request: 01/24/23 Last office visit: 07/24/22 Date of last refill: 11/27/22 Last refill amount: 30 Follow up time period per chart: ?

## 2023-02-07 DIAGNOSIS — Z79899 Other long term (current) drug therapy: Secondary | ICD-10-CM | POA: Diagnosis not present

## 2023-02-07 DIAGNOSIS — R0602 Shortness of breath: Secondary | ICD-10-CM | POA: Diagnosis not present

## 2023-02-07 DIAGNOSIS — Z96652 Presence of left artificial knee joint: Secondary | ICD-10-CM | POA: Diagnosis not present

## 2023-02-07 DIAGNOSIS — Q761 Klippel-Feil syndrome: Secondary | ICD-10-CM | POA: Diagnosis not present

## 2023-02-07 DIAGNOSIS — M0579 Rheumatoid arthritis with rheumatoid factor of multiple sites without organ or systems involvement: Secondary | ICD-10-CM | POA: Diagnosis not present

## 2023-02-07 DIAGNOSIS — M961 Postlaminectomy syndrome, not elsewhere classified: Secondary | ICD-10-CM | POA: Diagnosis not present

## 2023-02-07 DIAGNOSIS — M3500 Sicca syndrome, unspecified: Secondary | ICD-10-CM | POA: Diagnosis not present

## 2023-02-07 DIAGNOSIS — Z6824 Body mass index (BMI) 24.0-24.9, adult: Secondary | ICD-10-CM | POA: Diagnosis not present

## 2023-02-07 DIAGNOSIS — M1711 Unilateral primary osteoarthritis, right knee: Secondary | ICD-10-CM | POA: Diagnosis not present

## 2023-02-12 DIAGNOSIS — M0579 Rheumatoid arthritis with rheumatoid factor of multiple sites without organ or systems involvement: Secondary | ICD-10-CM | POA: Diagnosis not present

## 2023-02-12 DIAGNOSIS — M21372 Foot drop, left foot: Secondary | ICD-10-CM | POA: Diagnosis not present

## 2023-02-12 DIAGNOSIS — M359 Systemic involvement of connective tissue, unspecified: Secondary | ICD-10-CM | POA: Diagnosis not present

## 2023-02-12 DIAGNOSIS — M5136 Other intervertebral disc degeneration, lumbar region: Secondary | ICD-10-CM | POA: Diagnosis not present

## 2023-02-12 DIAGNOSIS — R252 Cramp and spasm: Secondary | ICD-10-CM | POA: Diagnosis not present

## 2023-02-12 DIAGNOSIS — Z79899 Other long term (current) drug therapy: Secondary | ICD-10-CM | POA: Diagnosis not present

## 2023-02-12 DIAGNOSIS — M797 Fibromyalgia: Secondary | ICD-10-CM | POA: Diagnosis not present

## 2023-02-12 DIAGNOSIS — Z6823 Body mass index (BMI) 23.0-23.9, adult: Secondary | ICD-10-CM | POA: Diagnosis not present

## 2023-02-12 DIAGNOSIS — M25511 Pain in right shoulder: Secondary | ICD-10-CM | POA: Diagnosis not present

## 2023-02-12 DIAGNOSIS — M722 Plantar fascial fibromatosis: Secondary | ICD-10-CM | POA: Diagnosis not present

## 2023-02-21 ENCOUNTER — Other Ambulatory Visit: Payer: Self-pay | Admitting: Family Medicine

## 2023-02-21 DIAGNOSIS — E039 Hypothyroidism, unspecified: Secondary | ICD-10-CM

## 2023-02-27 DIAGNOSIS — Z5181 Encounter for therapeutic drug level monitoring: Secondary | ICD-10-CM | POA: Diagnosis not present

## 2023-02-27 DIAGNOSIS — M961 Postlaminectomy syndrome, not elsewhere classified: Secondary | ICD-10-CM | POA: Diagnosis not present

## 2023-02-27 DIAGNOSIS — Z79899 Other long term (current) drug therapy: Secondary | ICD-10-CM | POA: Diagnosis not present

## 2023-03-05 ENCOUNTER — Other Ambulatory Visit: Payer: Self-pay | Admitting: Family Medicine

## 2023-03-06 NOTE — Telephone Encounter (Signed)
Patient is requesting a refill of the following medications: Requested Prescriptions   Pending Prescriptions Disp Refills   baclofen (LIORESAL) 10 MG tablet [Pharmacy Med Name: Baclofen Oral Tablet 10 MG] 30 tablet 0    Sig: TAKE ONE TABLET BY MOUTH THREE TIMES DAILY    Date of patient request: 03/06/23 Last office visit: 07/24/22 Date of last refill: 01/24/23 Last refill amount: 30 Follow up time period per chart: PRN

## 2023-03-07 DIAGNOSIS — Z96652 Presence of left artificial knee joint: Secondary | ICD-10-CM | POA: Diagnosis not present

## 2023-03-07 DIAGNOSIS — M3500 Sicca syndrome, unspecified: Secondary | ICD-10-CM | POA: Diagnosis not present

## 2023-03-07 DIAGNOSIS — Z79899 Other long term (current) drug therapy: Secondary | ICD-10-CM | POA: Diagnosis not present

## 2023-03-07 DIAGNOSIS — M1711 Unilateral primary osteoarthritis, right knee: Secondary | ICD-10-CM | POA: Diagnosis not present

## 2023-03-07 DIAGNOSIS — M961 Postlaminectomy syndrome, not elsewhere classified: Secondary | ICD-10-CM | POA: Diagnosis not present

## 2023-03-07 DIAGNOSIS — M0579 Rheumatoid arthritis with rheumatoid factor of multiple sites without organ or systems involvement: Secondary | ICD-10-CM | POA: Diagnosis not present

## 2023-03-07 DIAGNOSIS — Z6824 Body mass index (BMI) 24.0-24.9, adult: Secondary | ICD-10-CM | POA: Diagnosis not present

## 2023-03-07 DIAGNOSIS — Q761 Klippel-Feil syndrome: Secondary | ICD-10-CM | POA: Diagnosis not present

## 2023-04-01 ENCOUNTER — Other Ambulatory Visit: Payer: Self-pay | Admitting: Family Medicine

## 2023-04-01 NOTE — Telephone Encounter (Signed)
Requested Prescriptions  Pending Prescriptions Disp Refills  baclofen (LIORESAL) 10 MG tablet [Pharmacy Med Name: Baclofen Oral Tablet 10 MG] 90 tablet 0   Sig: TAKE ONE TABLET BY MOUTH THREE TIMES DAILY   Last refilled 03/06/2023 Last office visit 07/05/2022

## 2023-04-04 DIAGNOSIS — N183 Chronic kidney disease, stage 3 unspecified: Secondary | ICD-10-CM | POA: Diagnosis not present

## 2023-04-04 DIAGNOSIS — Z79899 Other long term (current) drug therapy: Secondary | ICD-10-CM | POA: Diagnosis not present

## 2023-04-04 DIAGNOSIS — Q761 Klippel-Feil syndrome: Secondary | ICD-10-CM | POA: Diagnosis not present

## 2023-04-04 DIAGNOSIS — Z6824 Body mass index (BMI) 24.0-24.9, adult: Secondary | ICD-10-CM | POA: Diagnosis not present

## 2023-04-04 DIAGNOSIS — Z96652 Presence of left artificial knee joint: Secondary | ICD-10-CM | POA: Diagnosis not present

## 2023-04-04 DIAGNOSIS — M961 Postlaminectomy syndrome, not elsewhere classified: Secondary | ICD-10-CM | POA: Diagnosis not present

## 2023-04-04 DIAGNOSIS — M3500 Sicca syndrome, unspecified: Secondary | ICD-10-CM | POA: Diagnosis not present

## 2023-04-04 DIAGNOSIS — M0579 Rheumatoid arthritis with rheumatoid factor of multiple sites without organ or systems involvement: Secondary | ICD-10-CM | POA: Diagnosis not present

## 2023-04-04 DIAGNOSIS — M1711 Unilateral primary osteoarthritis, right knee: Secondary | ICD-10-CM | POA: Diagnosis not present

## 2023-04-27 ENCOUNTER — Other Ambulatory Visit: Payer: Self-pay | Admitting: Family Medicine

## 2023-04-27 DIAGNOSIS — R053 Chronic cough: Secondary | ICD-10-CM

## 2023-05-03 ENCOUNTER — Other Ambulatory Visit: Payer: Self-pay | Admitting: Family Medicine

## 2023-05-05 DIAGNOSIS — Z96652 Presence of left artificial knee joint: Secondary | ICD-10-CM | POA: Diagnosis not present

## 2023-05-05 DIAGNOSIS — Z6824 Body mass index (BMI) 24.0-24.9, adult: Secondary | ICD-10-CM | POA: Diagnosis not present

## 2023-05-05 DIAGNOSIS — M1711 Unilateral primary osteoarthritis, right knee: Secondary | ICD-10-CM | POA: Diagnosis not present

## 2023-05-05 DIAGNOSIS — Q761 Klippel-Feil syndrome: Secondary | ICD-10-CM | POA: Diagnosis not present

## 2023-05-05 DIAGNOSIS — M0579 Rheumatoid arthritis with rheumatoid factor of multiple sites without organ or systems involvement: Secondary | ICD-10-CM | POA: Diagnosis not present

## 2023-05-05 DIAGNOSIS — M961 Postlaminectomy syndrome, not elsewhere classified: Secondary | ICD-10-CM | POA: Diagnosis not present

## 2023-05-05 DIAGNOSIS — M3500 Sicca syndrome, unspecified: Secondary | ICD-10-CM | POA: Diagnosis not present

## 2023-05-05 DIAGNOSIS — N183 Chronic kidney disease, stage 3 unspecified: Secondary | ICD-10-CM | POA: Diagnosis not present

## 2023-05-05 DIAGNOSIS — Z79899 Other long term (current) drug therapy: Secondary | ICD-10-CM | POA: Diagnosis not present

## 2023-05-09 ENCOUNTER — Telehealth: Payer: Self-pay | Admitting: Family Medicine

## 2023-05-09 NOTE — Telephone Encounter (Signed)
Placed in folder at nurse station

## 2023-05-09 NOTE — Telephone Encounter (Signed)
Type of form received:Pre Opt Form   Additional comments:   Received YN:[id number]- Front Desk   Form should be Faxed/mailed to: (address/ fax #[phone]  Is patient requesting call for pickup:N/A  Form placed:  [employer] charge sheet.  Provider will determine charge. N/A  Individual made aware of 3-5 business day turn around No?    Pt has appt scheduled 05/21/2023 1pm

## 2023-05-21 ENCOUNTER — Ambulatory Visit: Payer: Medicare Other | Admitting: Family Medicine

## 2023-05-21 ENCOUNTER — Telehealth: Payer: Self-pay

## 2023-05-21 NOTE — Telephone Encounter (Signed)
Copied from CRM [id number]. Topic: Appointments - Scheduling Inquiry for Clinic >> May 21, 2023  9:05 AM Orinda Kenner C wrote: Reason for CRM: Bloodwork and EKG 10-14 days before the surgery which is not set, est around mid January 2025.  Pt has an appt for today at 1 pm, maybe reschedule bc pt wants to have all of this done in one day bc pt lives far, pls advise and c/b [phone].

## 2023-05-21 NOTE — Telephone Encounter (Signed)
Called patient to discuss this and to reschedule for a later date, pt notes she called back  little while ago and got this appointment changed already for 06/09/2023 no further action required

## 2023-05-22 ENCOUNTER — Other Ambulatory Visit: Payer: Self-pay | Admitting: Family Medicine

## 2023-05-22 DIAGNOSIS — E039 Hypothyroidism, unspecified: Secondary | ICD-10-CM

## 2023-05-26 NOTE — Telephone Encounter (Signed)
Surgical clearance appt needed

## 2023-05-27 NOTE — Telephone Encounter (Signed)
Appt was made

## 2023-06-01 ENCOUNTER — Other Ambulatory Visit: Payer: Self-pay | Admitting: Family Medicine

## 2023-06-05 DIAGNOSIS — M961 Postlaminectomy syndrome, not elsewhere classified: Secondary | ICD-10-CM | POA: Diagnosis not present

## 2023-06-05 DIAGNOSIS — M3500 Sicca syndrome, unspecified: Secondary | ICD-10-CM | POA: Diagnosis not present

## 2023-06-05 DIAGNOSIS — Z6824 Body mass index (BMI) 24.0-24.9, adult: Secondary | ICD-10-CM | POA: Diagnosis not present

## 2023-06-05 DIAGNOSIS — Z79899 Other long term (current) drug therapy: Secondary | ICD-10-CM | POA: Diagnosis not present

## 2023-06-05 DIAGNOSIS — Z96652 Presence of left artificial knee joint: Secondary | ICD-10-CM | POA: Diagnosis not present

## 2023-06-05 DIAGNOSIS — Q761 Klippel-Feil syndrome: Secondary | ICD-10-CM | POA: Diagnosis not present

## 2023-06-05 DIAGNOSIS — M1711 Unilateral primary osteoarthritis, right knee: Secondary | ICD-10-CM | POA: Diagnosis not present

## 2023-06-05 DIAGNOSIS — N183 Chronic kidney disease, stage 3 unspecified: Secondary | ICD-10-CM | POA: Diagnosis not present

## 2023-06-05 DIAGNOSIS — M0579 Rheumatoid arthritis with rheumatoid factor of multiple sites without organ or systems involvement: Secondary | ICD-10-CM | POA: Diagnosis not present

## 2023-06-09 ENCOUNTER — Encounter: Payer: Self-pay | Admitting: Family Medicine

## 2023-06-09 ENCOUNTER — Ambulatory Visit (INDEPENDENT_AMBULATORY_CARE_PROVIDER_SITE_OTHER): Payer: Medicare Other | Admitting: Family Medicine

## 2023-06-09 VITALS — BP 118/78 | HR 76 | Temp 97.8°F | Ht 62.0 in | Wt 133.1 lb

## 2023-06-09 DIAGNOSIS — Z01818 Encounter for other preprocedural examination: Secondary | ICD-10-CM | POA: Diagnosis not present

## 2023-06-09 NOTE — Patient Instructions (Signed)
 Schedule your complete physical in 6 months We'll notify you of your lab results and make any changes if needed HOLD the Prednisone and Aspirin prior to surgery Call with any questions or concerns Stay Safe!  Stay Healthy! Happy New Year!!

## 2023-06-09 NOTE — Progress Notes (Signed)
 Subjective:    Becky Boyd is a 68 y.o. female who presents to the office today for a preoperative consultation at the request of surgeon Dr Duwayne who plans on performing R total knee on  TBD  . This consultation is requested for the specific conditions prompting preoperative evaluation (i.e. because of potential affect on operative risk): autoimmune conditions. Planned anesthesia: spinal. The patient has the following known anesthesia issues:  none . Patients bleeding risk:  higher risk due to prednisone  use . Patient does not have objections to receiving blood products if needed.  The following portions of the patient's history were reviewed and updated as appropriate: allergies, current medications, past family history, past medical history, past social history, past surgical history, and problem list.  Review of Systems A comprehensive review of systems was negative.    Objective:    BP 118/78   Pulse 76   Temp 97.8 F (36.6 C)   Ht 5' 2 (1.575 m)   Wt 133 lb 2 oz (60.4 kg)   SpO2 96%   BMI 24.35 kg/m   General Appearance:    Alert, cooperative, no distress, appears stated age  Head:    Normocephalic, without obvious abnormality, atraumatic  Eyes:    PERRL, conjunctiva/corneas clear, EOM's intact, fundi    benign, both eyes  Ears:    Normal TM's and external ear canals, both ears  Nose:   Nares normal, septum midline, mucosa normal, no drainage    or sinus tenderness  Throat:   Lips, mucosa, and tongue normal; teeth and gums normal  Neck:   Supple, symmetrical, trachea midline, no adenopathy;    thyroid :  no enlargement/tenderness/nodules; no carotid   bruit or JVD  Back:     Symmetric, no curvature, ROM normal, no CVA tenderness  Lungs:     Clear to auscultation bilaterally, respirations unlabored  Chest Wall:    No tenderness or deformity   Heart:    Regular rate and rhythm, S1 and S2 normal, no murmur, rub   or gallop  Breast Exam:    No tenderness, masses, or nipple  abnormality  Abdomen:     Soft, non-tender, bowel sounds active all four quadrants,    no masses, no organomegaly  Genitalia:    Normal female without lesion, discharge or tenderness  Rectal:    Normal tone, normal prostate, no masses or tenderness;   guaiac negative stool  Extremities:   Extremities normal, atraumatic, no cyanosis or edema  Pulses:   2+ and symmetric all extremities  Skin:   Skin color, texture, turgor normal, no rashes or lesions  Lymph nodes:   Cervical, supraclavicular, and axillary nodes normal  Neurologic:   CNII-XII intact, normal strength, sensation and reflexes    throughout    Predictors of intubation difficulty:  Morbid obesity? no  Anatomically abnormal facies? no  Prominent incisors? no  Receding mandible? no  Short, thick neck? no  Neck range of motion: normal  Dentition:  full implant  Cardiographics ECG: no changes since previous EKG Echocardiogram: NA  Imaging Chest x-ray:  NA    Lab Review  Pending   Assessment:      68 y.o. female with planned surgery as above.   Known risk factors for perioperative complications:  chronic prednisone  use    Difficulty with intubation is not anticipated.  Cardiac Risk Estimation: low  Current medications which may produce withdrawal symptoms if withheld perioperatively: none    Plan:    1. Preoperative  workup as follows ECG, hemoglobin, hematocrit, electrolytes, creatinine, glucose, liver function studies. 2. Change in medication regimen before surgery: discontinue ASA 14 days before surgery and prednisone  . 3. Prophylaxis for cardiac events with perioperative beta-blockers: not indicated. 4. Invasive hemodynamic monitoring perioperatively: at the discretion of anesthesiologist. 5. Deep vein thrombosis prophylaxis postoperatively:regimen to be chosen by surgical team. 6. Surveillance for postoperative MI with ECG immediately postoperatively and on postoperative days 1 and 2 AND troponin levels 24  hours postoperatively and on day 4 or hospital discharge (whichever comes first): at the discretion of anesthesiologist. 7. Other measures:  None

## 2023-06-10 LAB — BASIC METABOLIC PANEL
BUN: 13 mg/dL (ref 6–23)
CO2: 27 meq/L (ref 19–32)
Calcium: 9.5 mg/dL (ref 8.4–10.5)
Chloride: 100 meq/L (ref 96–112)
Creatinine, Ser: 0.92 mg/dL (ref 0.40–1.20)
GFR: 63.55 mL/min (ref >60.00)
Glucose, Bld: 86 mg/dL (ref 70–99)
Potassium: 4 meq/L (ref 3.5–5.1)
Sodium: 135 meq/L (ref 135–145)

## 2023-06-10 LAB — HEPATIC FUNCTION PANEL
ALT: 15 U/L (ref 0–35)
AST: 27 U/L (ref 0–37)
Albumin: 4.5 g/dL (ref 3.5–5.2)
Alkaline Phosphatase: 108 U/L (ref 39–117)
Bilirubin, Direct: 0.1 mg/dL (ref 0.0–0.3)
Total Bilirubin: 0.4 mg/dL (ref 0.2–1.2)
Total Protein: 7.5 g/dL (ref 6.0–8.3)

## 2023-06-10 LAB — LIPID PANEL
Cholesterol: 165 mg/dL (ref 0–200)
HDL: 49.5 mg/dL (ref >39.00)
LDL Cholesterol: 81 mg/dL (ref 0–99)
NonHDL: 115.24
Total CHOL/HDL Ratio: 3
Triglycerides: 173 mg/dL — ABNORMAL HIGH (ref 0.0–149.0)
VLDL: 34.6 mg/dL (ref 0.0–40.0)

## 2023-06-10 LAB — TSH: TSH: 4.87 u[IU]/mL (ref 0.35–5.50)

## 2023-06-18 DIAGNOSIS — Z79899 Other long term (current) drug therapy: Secondary | ICD-10-CM | POA: Diagnosis not present

## 2023-06-18 DIAGNOSIS — M359 Systemic involvement of connective tissue, unspecified: Secondary | ICD-10-CM | POA: Diagnosis not present

## 2023-06-18 DIAGNOSIS — M21372 Foot drop, left foot: Secondary | ICD-10-CM | POA: Diagnosis not present

## 2023-06-18 DIAGNOSIS — R252 Cramp and spasm: Secondary | ICD-10-CM | POA: Diagnosis not present

## 2023-06-18 DIAGNOSIS — R29818 Other symptoms and signs involving the nervous system: Secondary | ICD-10-CM | POA: Diagnosis not present

## 2023-06-18 DIAGNOSIS — M0579 Rheumatoid arthritis with rheumatoid factor of multiple sites without organ or systems involvement: Secondary | ICD-10-CM | POA: Diagnosis not present

## 2023-06-18 DIAGNOSIS — M5136 Other intervertebral disc degeneration, lumbar region with discogenic back pain only: Secondary | ICD-10-CM | POA: Diagnosis not present

## 2023-06-18 DIAGNOSIS — M1711 Unilateral primary osteoarthritis, right knee: Secondary | ICD-10-CM | POA: Diagnosis not present

## 2023-06-18 DIAGNOSIS — M25511 Pain in right shoulder: Secondary | ICD-10-CM | POA: Diagnosis not present

## 2023-06-18 DIAGNOSIS — Z6823 Body mass index (BMI) 23.0-23.9, adult: Secondary | ICD-10-CM | POA: Diagnosis not present

## 2023-06-18 DIAGNOSIS — M797 Fibromyalgia: Secondary | ICD-10-CM | POA: Diagnosis not present

## 2023-06-18 DIAGNOSIS — M722 Plantar fascial fibromatosis: Secondary | ICD-10-CM | POA: Diagnosis not present

## 2023-07-03 DIAGNOSIS — Q761 Klippel-Feil syndrome: Secondary | ICD-10-CM | POA: Diagnosis not present

## 2023-07-03 DIAGNOSIS — Z96652 Presence of left artificial knee joint: Secondary | ICD-10-CM | POA: Diagnosis not present

## 2023-07-03 DIAGNOSIS — Z79899 Other long term (current) drug therapy: Secondary | ICD-10-CM | POA: Diagnosis not present

## 2023-07-03 DIAGNOSIS — N183 Chronic kidney disease, stage 3 unspecified: Secondary | ICD-10-CM | POA: Diagnosis not present

## 2023-07-03 DIAGNOSIS — M3500 Sicca syndrome, unspecified: Secondary | ICD-10-CM | POA: Diagnosis not present

## 2023-07-03 DIAGNOSIS — M1711 Unilateral primary osteoarthritis, right knee: Secondary | ICD-10-CM | POA: Diagnosis not present

## 2023-07-03 DIAGNOSIS — M0579 Rheumatoid arthritis with rheumatoid factor of multiple sites without organ or systems involvement: Secondary | ICD-10-CM | POA: Diagnosis not present

## 2023-07-03 DIAGNOSIS — Z6824 Body mass index (BMI) 24.0-24.9, adult: Secondary | ICD-10-CM | POA: Diagnosis not present

## 2023-07-03 DIAGNOSIS — M961 Postlaminectomy syndrome, not elsewhere classified: Secondary | ICD-10-CM | POA: Diagnosis not present

## 2023-07-24 ENCOUNTER — Other Ambulatory Visit: Payer: Self-pay | Admitting: Family Medicine

## 2023-07-24 DIAGNOSIS — R053 Chronic cough: Secondary | ICD-10-CM

## 2023-08-05 DIAGNOSIS — M4322 Fusion of spine, cervical region: Secondary | ICD-10-CM | POA: Diagnosis not present

## 2023-08-05 DIAGNOSIS — Q761 Klippel-Feil syndrome: Secondary | ICD-10-CM | POA: Diagnosis not present

## 2023-08-05 DIAGNOSIS — Z96652 Presence of left artificial knee joint: Secondary | ICD-10-CM | POA: Diagnosis not present

## 2023-08-05 DIAGNOSIS — Z6824 Body mass index (BMI) 24.0-24.9, adult: Secondary | ICD-10-CM | POA: Diagnosis not present

## 2023-08-05 DIAGNOSIS — N183 Chronic kidney disease, stage 3 unspecified: Secondary | ICD-10-CM | POA: Diagnosis not present

## 2023-08-05 DIAGNOSIS — M961 Postlaminectomy syndrome, not elsewhere classified: Secondary | ICD-10-CM | POA: Diagnosis not present

## 2023-08-05 DIAGNOSIS — M1711 Unilateral primary osteoarthritis, right knee: Secondary | ICD-10-CM | POA: Diagnosis not present

## 2023-08-05 DIAGNOSIS — M4326 Fusion of spine, lumbar region: Secondary | ICD-10-CM | POA: Diagnosis not present

## 2023-08-05 DIAGNOSIS — Z79899 Other long term (current) drug therapy: Secondary | ICD-10-CM | POA: Diagnosis not present

## 2023-08-05 DIAGNOSIS — M0579 Rheumatoid arthritis with rheumatoid factor of multiple sites without organ or systems involvement: Secondary | ICD-10-CM | POA: Diagnosis not present

## 2023-08-05 DIAGNOSIS — M3500 Sicca syndrome, unspecified: Secondary | ICD-10-CM | POA: Diagnosis not present

## 2023-08-19 ENCOUNTER — Other Ambulatory Visit: Payer: Self-pay | Admitting: Family Medicine

## 2023-08-19 DIAGNOSIS — E039 Hypothyroidism, unspecified: Secondary | ICD-10-CM

## 2023-08-28 ENCOUNTER — Ambulatory Visit: Payer: Self-pay | Admitting: Orthopedic Surgery

## 2023-09-04 DIAGNOSIS — M4322 Fusion of spine, cervical region: Secondary | ICD-10-CM | POA: Diagnosis not present

## 2023-09-04 DIAGNOSIS — M0579 Rheumatoid arthritis with rheumatoid factor of multiple sites without organ or systems involvement: Secondary | ICD-10-CM | POA: Diagnosis not present

## 2023-09-04 DIAGNOSIS — M3500 Sicca syndrome, unspecified: Secondary | ICD-10-CM | POA: Diagnosis not present

## 2023-09-04 DIAGNOSIS — M961 Postlaminectomy syndrome, not elsewhere classified: Secondary | ICD-10-CM | POA: Diagnosis not present

## 2023-09-04 DIAGNOSIS — Z96652 Presence of left artificial knee joint: Secondary | ICD-10-CM | POA: Diagnosis not present

## 2023-09-04 DIAGNOSIS — Z6824 Body mass index (BMI) 24.0-24.9, adult: Secondary | ICD-10-CM | POA: Diagnosis not present

## 2023-09-04 DIAGNOSIS — M1711 Unilateral primary osteoarthritis, right knee: Secondary | ICD-10-CM | POA: Diagnosis not present

## 2023-09-04 DIAGNOSIS — N183 Chronic kidney disease, stage 3 unspecified: Secondary | ICD-10-CM | POA: Diagnosis not present

## 2023-09-04 DIAGNOSIS — Z79899 Other long term (current) drug therapy: Secondary | ICD-10-CM | POA: Diagnosis not present

## 2023-09-09 DIAGNOSIS — Z79899 Other long term (current) drug therapy: Secondary | ICD-10-CM | POA: Diagnosis not present

## 2023-10-06 DIAGNOSIS — M961 Postlaminectomy syndrome, not elsewhere classified: Secondary | ICD-10-CM | POA: Diagnosis not present

## 2023-10-06 DIAGNOSIS — N183 Chronic kidney disease, stage 3 unspecified: Secondary | ICD-10-CM | POA: Diagnosis not present

## 2023-10-06 DIAGNOSIS — M1711 Unilateral primary osteoarthritis, right knee: Secondary | ICD-10-CM | POA: Diagnosis not present

## 2023-10-06 DIAGNOSIS — M0579 Rheumatoid arthritis with rheumatoid factor of multiple sites without organ or systems involvement: Secondary | ICD-10-CM | POA: Diagnosis not present

## 2023-10-06 DIAGNOSIS — Z79899 Other long term (current) drug therapy: Secondary | ICD-10-CM | POA: Diagnosis not present

## 2023-10-06 DIAGNOSIS — Z96652 Presence of left artificial knee joint: Secondary | ICD-10-CM | POA: Diagnosis not present

## 2023-10-06 DIAGNOSIS — Z6824 Body mass index (BMI) 24.0-24.9, adult: Secondary | ICD-10-CM | POA: Diagnosis not present

## 2023-10-06 DIAGNOSIS — M4322 Fusion of spine, cervical region: Secondary | ICD-10-CM | POA: Diagnosis not present

## 2023-10-06 DIAGNOSIS — M3500 Sicca syndrome, unspecified: Secondary | ICD-10-CM | POA: Diagnosis not present

## 2023-10-08 ENCOUNTER — Ambulatory Visit: Payer: Self-pay | Admitting: Orthopedic Surgery

## 2023-10-09 ENCOUNTER — Ambulatory Visit: Payer: Self-pay | Admitting: Orthopedic Surgery

## 2023-10-09 NOTE — H&P (Signed)
 Becky Boyd is an 68 y.o. female.   Chief Complaint: right knee pain, left knee stiffness HPI: Becky Boyd is here today for her history and physical. She is scheduled for a right total knee arthroplasty and left knee EUA/MUA by Dr. Leighton Punches on 10/22/23 at [facility]  Dr. Leighton Punches and the patient mutually agreed to proceed with a total knee replacement. Risks and benefits of the procedure were discussed including stiffness, suboptimal range of motion, persistent pain, infection requiring removal of prosthesis and reinsertion, need for prophylactic antibiotics in the future, for example, dental procedures, possible need for manipulation, revision in the future and also anesthetic complications including DVT, PE, etc. We discussed the perioperative course, time in the hospital, postoperative recovery and the need for elevation to control swelling. We also discussed the predicted range of motion and the probability that squatting and kneeling would be unobtainable in the future. In addition, postoperative anticoagulation was discussed. We have obtained preoperative medical clearance as necessary. Provided illustrated handout and discussed it in detail. They will enroll in the total joint replacement educational forum at the hospital.  Becky Boyd preop is scheduled for May 16. She has an upcoming beach trip in August  Past Medical History:  Diagnosis Date   Aneurysm (HCC)    Anorexia    COVID    Dysphagia    Fibromyalgia    Fibromyalgia    GERD (gastroesophageal reflux disease)    Hashimoto thyroiditis    History of kidney stones    hx of multiple kidney stones    Hypothyroidism    Kidney stones    Lupus    Palpitations    Rheumatoid arthritis (HCC)    Sjogren's disease (HCC)     Past Surgical History:  Procedure Laterality Date   ABDOMINAL HYSTERECTOMY     CERVICAL FUSION  2004   C4-C5 with Instrumentation   CERVICAL FUSION  1990   C3-C4   cervical rods     CHOLECYSTECTOMY      kidney stone     l5-s1 surgery     LUMBAR MICRODISCECTOMY  2004   Bilateral L5-S1   TOTAL KNEE ARTHROPLASTY Left 06/09/2020   Procedure: TOTAL KNEE ARTHROPLASTY;  Surgeon: Orvan Blanch, MD;  Location: [facility];  Service: Orthopedics;  Laterality: Left;    Family History  Problem Relation Age of Onset   COPD Mother    Heart disease Father    Diabetes Sister    COPD Brother    Breast cancer Paternal Aunt    Aneurysm Paternal Aunt    Diabetes Other    Dementia Neg Hx    Alzheimer's disease Neg Hx    Social History:  reports that she has never smoked. She has never used smokeless tobacco. She reports that she does not drink alcohol  and does not use drugs.  Allergies:  Allergies  Allergen Reactions   Plaquenil  [Hydroxychloroquine  Sulfate] Other (See Comments)    Prolonged QT interval, skin feels like bee stings   Current meds: albuterol  sulfate HFA 90 mcg/actuation aerosol inhaler aspirin  atorvastatin  20 mg tablet baclofen  10 mg tablet DULoxetine  60 mg capsule,delayed release levothyroxine   meloxicam 15 mg tablet pantoprazole  40 mg tablet,delayed release potassium chloride  ER 20 mEq tablet,extended release(part/cryst) potassium citrate ER 15 mEq (1,620 mg) tablet,extended release predniSONE  5 mg tablet sulfaSALAzine  500 mg tablet  Review of Systems  Constitutional: Negative.   HENT: Negative.    Eyes: Negative.   Respiratory: Negative.    Cardiovascular: Negative.   Endocrine:  Negative.   Genitourinary: Negative.   Musculoskeletal:  Positive for arthralgias, gait problem, joint swelling and myalgias.  Skin: Negative.     There were no vitals taken for this visit. Physical Exam Constitutional:      Appearance: Normal appearance.  HENT:     Head: Normocephalic and atraumatic.     Right Ear: External ear normal.     Left Ear: External ear normal.     Nose: Nose normal.     Mouth/Throat:     Pharynx: Oropharynx is clear.  Eyes:     Conjunctiva/sclera:  Conjunctivae normal.  Cardiovascular:     Rate and Rhythm: Normal rate and regular rhythm.     Pulses: Normal pulses.  Pulmonary:     Effort: Pulmonary effort is normal.  Abdominal:     General: Bowel sounds are normal.  Musculoskeletal:     Cervical back: Normal range of motion.     Comments: Right knee tender medial joint line patellofemoral pain compression ranges -5-1 10. Mild effusion. No DVT. Ipsilateral hip and ankle exams unremarkable  Left knee 0-90. No DVT no significant effusion.  Skin:    General: Skin is warm and dry.  Neurological:     Mental Status: She is alert.    X-rays demonstrate end-stage osteoarthrosis medial compartment of the right knee with a varus deformity Previous x-rays of the left knee demonstrate no evidence of loosening.  Assessment/Plan Impression:  1. End-stage right knee osteoarthritis refractory to conservative treatment  2. Left knee arthrofibrosis status post total knee replacement refractory to conservative treatment  Plan: Pt with end-stage right knee DJD, bone-on-bone, refractory to conservative tx, scheduled for right total knee replacement and left knee manipulation under anesthesia by Dr. Leighton Punches on May 21. We again discussed the procedure itself as well as risks, complications and alternatives, including but not limited to DVT, PE, infx, bleeding, failure of procedure, need for secondary procedure including manipulation, nerve injury, ongoing pain/symptoms, anesthesia risk, even stroke or death. Also discussed typical post-op protocols, activity restrictions, need for PT, flexion/extension exercises, time out of work. Discussed need for DVT ppx post-op per protocol. Discussed dental ppx and infx prevention. Also discussed limitations post-operatively such as kneeling and squatting. All questions were answered. Patient desires to proceed with surgery as scheduled.  Will hold supplements, ASA and NSAIDs accordingly. Will remain NPO after midnight  the night before surgery. Will present to [facility] for pre-op testing. Anticipate hospital stay to include at least 2 midnights given medical history and to ensure proper pain control. Plan aspirin  for DVT ppx post-op. Plan oxycodone , tizanidine, Colace, Miralax . Plan discharge to home post-op with family members at home for assistance, start outpatient PT immediately postop. Will follow up 10-14 days post-op for suture removal and xrays.  Plan right total knee replacement, exam and manipulation under anesthesia left knee  Barba Levin, PA-C for Dr Leighton Punches 10/09/2023, 12:15 PM

## 2023-10-09 NOTE — H&P (View-Only) (Signed)
 Taleeyah Azcarate is an 68 y.o. female.   Chief Complaint: right knee pain, left knee stiffness HPI: [name] is here today for her history and physical. She is scheduled for a right total knee arthroplasty and left knee EUA/MUA by Dr. Leighton Punches on 10/22/23 at [facility]  Dr. Leighton Punches and the patient mutually agreed to proceed with a total knee replacement. Risks and benefits of the procedure were discussed including stiffness, suboptimal range of motion, persistent pain, infection requiring removal of prosthesis and reinsertion, need for prophylactic antibiotics in the future, for example, dental procedures, possible need for manipulation, revision in the future and also anesthetic complications including DVT, PE, etc. We discussed the perioperative course, time in the hospital, postoperative recovery and the need for elevation to control swelling. We also discussed the predicted range of motion and the probability that squatting and kneeling would be unobtainable in the future. In addition, postoperative anticoagulation was discussed. We have obtained preoperative medical clearance as necessary. Provided illustrated handout and discussed it in detail. They will enroll in the total joint replacement educational forum at the hospital.  [name] preop is scheduled for May 16. She has an upcoming beach trip in August  Past Medical History:  Diagnosis Date   Aneurysm (HCC)    Anorexia    COVID    Dysphagia    Fibromyalgia    Fibromyalgia    GERD (gastroesophageal reflux disease)    Hashimoto thyroiditis    History of kidney stones    hx of multiple kidney stones    Hypothyroidism    Kidney stones    Lupus    Palpitations    Rheumatoid arthritis (HCC)    Sjogren's disease (HCC)     Past Surgical History:  Procedure Laterality Date   ABDOMINAL HYSTERECTOMY     CERVICAL FUSION  2004   C4-C5 with Instrumentation   CERVICAL FUSION  1990   C3-C4   cervical rods     CHOLECYSTECTOMY      kidney stone     l5-s1 surgery     LUMBAR MICRODISCECTOMY  2004   Bilateral L5-S1   TOTAL KNEE ARTHROPLASTY Left 06/09/2020   Procedure: TOTAL KNEE ARTHROPLASTY;  Surgeon: Orvan Blanch, MD;  Location: [facility];  Service: Orthopedics;  Laterality: Left;    Family History  Problem Relation Age of Onset   COPD Mother    Heart disease Father    Diabetes Sister    COPD Brother    Breast cancer Paternal Aunt    Aneurysm Paternal Aunt    Diabetes Other    Dementia Neg Hx    Alzheimer's disease Neg Hx    Social History:  reports that she has never smoked. She has never used smokeless tobacco. She reports that she does not drink alcohol  and does not use drugs.  Allergies:  Allergies  Allergen Reactions   Plaquenil  [Hydroxychloroquine  Sulfate] Other (See Comments)    Prolonged QT interval, skin feels like bee stings   Current meds: albuterol  sulfate HFA 90 mcg/actuation aerosol inhaler aspirin  atorvastatin  20 mg tablet baclofen  10 mg tablet DULoxetine  60 mg capsule,delayed release levothyroxine   meloxicam 15 mg tablet pantoprazole  40 mg tablet,delayed release potassium chloride  ER 20 mEq tablet,extended release(part/cryst) potassium citrate ER 15 mEq (1,620 mg) tablet,extended release predniSONE  5 mg tablet sulfaSALAzine  500 mg tablet  Review of Systems  Constitutional: Negative.   HENT: Negative.    Eyes: Negative.   Respiratory: Negative.    Cardiovascular: Negative.   Endocrine:  Negative.   Genitourinary: Negative.   Musculoskeletal:  Positive for arthralgias, gait problem, joint swelling and myalgias.  Skin: Negative.     There were no vitals taken for this visit. Physical Exam Constitutional:      Appearance: Normal appearance.  HENT:     Head: Normocephalic and atraumatic.     Right Ear: External ear normal.     Left Ear: External ear normal.     Nose: Nose normal.     Mouth/Throat:     Pharynx: Oropharynx is clear.  Eyes:     Conjunctiva/sclera:  Conjunctivae normal.  Cardiovascular:     Rate and Rhythm: Normal rate and regular rhythm.     Pulses: Normal pulses.  Pulmonary:     Effort: Pulmonary effort is normal.  Abdominal:     General: Bowel sounds are normal.  Musculoskeletal:     Cervical back: Normal range of motion.     Comments: Right knee tender medial joint line patellofemoral pain compression ranges -5-1 10. Mild effusion. No DVT. Ipsilateral hip and ankle exams unremarkable  Left knee 0-90. No DVT no significant effusion.  Skin:    General: Skin is warm and dry.  Neurological:     Mental Status: She is alert.    X-rays demonstrate end-stage osteoarthrosis medial compartment of the right knee with a varus deformity Previous x-rays of the left knee demonstrate no evidence of loosening.  Assessment/Plan Impression:  1. End-stage right knee osteoarthritis refractory to conservative treatment  2. Left knee arthrofibrosis status post total knee replacement refractory to conservative treatment  Plan: Pt with end-stage right knee DJD, bone-on-bone, refractory to conservative tx, scheduled for right total knee replacement and left knee manipulation under anesthesia by Dr. Leighton Punches on May 21. We again discussed the procedure itself as well as risks, complications and alternatives, including but not limited to DVT, PE, infx, bleeding, failure of procedure, need for secondary procedure including manipulation, nerve injury, ongoing pain/symptoms, anesthesia risk, even stroke or death. Also discussed typical post-op protocols, activity restrictions, need for PT, flexion/extension exercises, time out of work. Discussed need for DVT ppx post-op per protocol. Discussed dental ppx and infx prevention. Also discussed limitations post-operatively such as kneeling and squatting. All questions were answered. Patient desires to proceed with surgery as scheduled.  Will hold supplements, ASA and NSAIDs accordingly. Will remain NPO after midnight  the night before surgery. Will present to [facility] for pre-op testing. Anticipate hospital stay to include at least 2 midnights given medical history and to ensure proper pain control. Plan aspirin  for DVT ppx post-op. Plan oxycodone , tizanidine, Colace, Miralax . Plan discharge to home post-op with family members at home for assistance, start outpatient PT immediately postop. Will follow up 10-14 days post-op for suture removal and xrays.  Plan right total knee replacement, exam and manipulation under anesthesia left knee  Barba Levin, PA-C for Dr Leighton Punches 10/09/2023, 12:15 PM

## 2023-10-10 DIAGNOSIS — Z79899 Other long term (current) drug therapy: Secondary | ICD-10-CM | POA: Diagnosis not present

## 2023-10-15 NOTE — Progress Notes (Signed)
 COVID Vaccine Completed: yes  Date of COVID positive in last 90 days:  PCP - Kirtland Perfect, MD Cardiologist - Aldona Amel, MD  Clearance in media tab dated 07/03/23  Chest x-ray -  EKG - 06/09/23 Epic Stress Test - 09/29/14 Epic ECHO - 11/02/19 Epic Cardiac Cath -  Pacemaker/ICD device last checked: Spinal Cord Stimulator:  Bowel Prep -   Sleep Study -  CPAP -   Fasting Blood Sugar -  Checks Blood Sugar _____ times a day  Last dose of GLP1 agonist-  N/A GLP1 instructions:  Hold 7 days before surgery    Last dose of SGLT-2 inhibitors-  N/A SGLT-2 instructions:  Hold 3 days before surgery    Blood Thinner Instructions:  Last dose:   Time: Aspirin  Instructions: ASA 81 Last Dose:  Activity level:  Can go up a flight of stairs and perform activities of daily living without stopping and without symptoms of chest pain or shortness of breath.  Able to exercise without symptoms  Unable to go up a flight of stairs without symptoms of     Anesthesia review:   Patient denies shortness of breath, fever, cough and chest pain at PAT appointment  Patient verbalized understanding of instructions that were given to them at the PAT appointment. Patient was also instructed that they will need to review over the PAT instructions again at home before surgery.

## 2023-10-17 ENCOUNTER — Other Ambulatory Visit: Payer: Self-pay

## 2023-10-17 ENCOUNTER — Encounter (HOSPITAL_COMMUNITY)
Admission: RE | Admit: 2023-10-17 | Discharge: 2023-10-17 | Disposition: A | Discharge status: Home / Self Care | Source: Ambulatory Visit | Attending: Specialist | Admitting: Specialist

## 2023-10-17 ENCOUNTER — Encounter (HOSPITAL_COMMUNITY): Payer: Self-pay

## 2023-10-17 VITALS — BP 133/75 | HR 83 | Temp 97.8°F | Resp 16 | Ht 60.25 in | Wt 136.0 lb

## 2023-10-17 DIAGNOSIS — Z01812 Encounter for preprocedural laboratory examination: Secondary | ICD-10-CM | POA: Diagnosis not present

## 2023-10-17 DIAGNOSIS — M359 Systemic involvement of connective tissue, unspecified: Secondary | ICD-10-CM | POA: Diagnosis not present

## 2023-10-17 DIAGNOSIS — M25511 Pain in right shoulder: Secondary | ICD-10-CM | POA: Diagnosis not present

## 2023-10-17 DIAGNOSIS — I2510 Atherosclerotic heart disease of native coronary artery without angina pectoris: Secondary | ICD-10-CM

## 2023-10-17 DIAGNOSIS — Z01818 Encounter for other preprocedural examination: Secondary | ICD-10-CM

## 2023-10-17 DIAGNOSIS — M0579 Rheumatoid arthritis with rheumatoid factor of multiple sites without organ or systems involvement: Secondary | ICD-10-CM | POA: Diagnosis not present

## 2023-10-17 DIAGNOSIS — M797 Fibromyalgia: Secondary | ICD-10-CM | POA: Diagnosis not present

## 2023-10-17 DIAGNOSIS — Z79899 Other long term (current) drug therapy: Secondary | ICD-10-CM | POA: Diagnosis not present

## 2023-10-17 HISTORY — DX: Transient cerebral ischemic attack, unspecified: G45.9

## 2023-10-17 LAB — CBC
HCT: 37.6 % (ref 36.0–46.0)
Hemoglobin: 11.6 g/dL — ABNORMAL LOW (ref 12.0–15.0)
MCH: 31.9 pg (ref 26.0–34.0)
MCHC: 30.9 g/dL (ref 30.0–36.0)
MCV: 103.3 fL — ABNORMAL HIGH (ref 80.0–100.0)
Platelets: 210 10*3/uL (ref 150–400)
RBC: 3.64 MIL/uL — ABNORMAL LOW (ref 3.87–5.11)
RDW: 14.3 % (ref 11.5–15.5)
WBC: 7 10*3/uL (ref 4.0–10.5)
nRBC: 0 % (ref 0.0–0.2)

## 2023-10-17 LAB — SURGICAL PCR SCREEN
MRSA, PCR: NEGATIVE
Staphylococcus aureus: NEGATIVE

## 2023-10-17 LAB — BASIC METABOLIC PANEL WITH GFR
Anion gap: 8 (ref 5–15)
BUN: 12 mg/dL (ref 8–23)
CO2: 25 mmol/L (ref 22–32)
Calcium: 9 mg/dL (ref 8.9–10.3)
Chloride: 106 mmol/L (ref 98–111)
Creatinine, Ser: 1.01 mg/dL — ABNORMAL HIGH (ref 0.44–1.00)
GFR, Estimated: >60 mL/min (ref >60)
Glucose, Bld: 120 mg/dL — ABNORMAL HIGH (ref 70–99)
Potassium: 3.9 mmol/L (ref 3.5–5.1)
Sodium: 139 mmol/L (ref 135–145)

## 2023-10-17 NOTE — Patient Instructions (Addendum)
 SURGICAL WAITING ROOM VISITATION  Patients having surgery or a procedure may have no more than 2 support people in the waiting area - these visitors may rotate.    Children under the age of 97 must have an adult with them who is not the patient.  Due to an increase in RSV and influenza rates and associated hospitalizations, children ages 62 and under may not visit patients in [facility] hospitals.  Visitors with respiratory illnesses are discouraged from visiting and should remain at home.  If the patient needs to stay at the hospital during part of their recovery, the visitor guidelines for inpatient rooms apply. Pre-op nurse will coordinate an appropriate time for 1 support person to accompany patient in pre-op.  This support person may not rotate.    Please refer to the [facility] website for the visitor guidelines for Inpatients (after your surgery is over and you are in a regular room).    Your procedure is scheduled on: 10/22/23   Report to [facility] Main Entrance    Report to admitting at 6:00 AM   Call this number if you have problems the morning of surgery [phone]   Do not eat food :After Midnight.   After Midnight you may have the following liquids until 5:30 AM DAY OF SURGERY  Water  Non-Citrus Juices (without pulp, NO RED-Apple, White grape, White cranberry) Black Coffee (NO MILK/CREAM OR CREAMERS, sugar ok)  Clear Tea (NO MILK/CREAM OR CREAMERS, sugar ok) regular and decaf                             Plain Jell-O (NO RED)                                           Fruit ices (not with fruit pulp, NO RED)                                     Popsicles (NO RED)                                                               Sports drinks like Gatorade (NO RED)                  The day of surgery:  Drink ONE (1) Pre-Surgery Clear Ensure at 5:30 AM the morning of surgery. Drink in one sitting. Do not sip.  This drink was given to you during your hospital   pre-op appointment visit. Nothing else to drink after completing the  Pre-Surgery Clear Ensure.          If you have questions, please contact your surgeon's office.   FOLLOW BOWEL PREP AND ANY ADDITIONAL PRE OP INSTRUCTIONS YOU RECEIVED FROM YOUR SURGEON'S OFFICE!!!     Oral Hygiene is also important to reduce your risk of infection.                                    Remember -  BRUSH YOUR TEETH THE MORNING OF SURGERY WITH YOUR REGULAR TOOTHPASTE  DENTURES WILL BE REMOVED PRIOR TO SURGERY PLEASE DO NOT APPLY "Poly grip" OR ADHESIVES!!!   Stop all vitamins and herbal supplements 7 days before surgery.   Take these medicines the morning of surgery with A SIP OF WATER : Atorvastatin , Hydrocodone , Levothyroxine , Pantoprazole , Prednisone                                You may not have any metal on your body including hair pins, jewelry, and body piercing             Do not wear make-up, lotions, powders, perfumes/cologne, or deodorant  Do not wear nail polish including gel and S&S, artificial/acrylic nails, or any other type of covering on natural nails including finger and toenails. If you have artificial nails, gel coating, etc. that needs to be removed by a nail salon please have this removed prior to surgery or surgery may need to be canceled/ delayed if the surgeon/ anesthesia feels like they are unable to be safely monitored.   Do not shave  48 hours prior to surgery.    Do not bring valuables to the hospital. [facility] IS NOT             RESPONSIBLE   FOR VALUABLES.   Contacts, glasses, dentures or bridgework may not be worn into surgery.   Bring small overnight bag day of surgery.   DO NOT BRING YOUR HOME MEDICATIONS TO THE HOSPITAL. PHARMACY WILL DISPENSE MEDICATIONS LISTED ON YOUR MEDICATION LIST TO YOU DURING YOUR ADMISSION IN THE HOSPITAL!   Special Instructions: Bring a copy of your healthcare power of attorney and living will documents the day of surgery if you  haven't scanned them before.              Please read over the following fact sheets you were given: IF YOU HAVE QUESTIONS ABOUT YOUR PRE-OP INSTRUCTIONS PLEASE CALL [name]   If you received a COVID test during your pre-op visit  it is requested that you wear a mask when out in public, stay away from anyone that may not be feeling well and notify your surgeon if you develop symptoms. If you test positive for Covid or have been in contact with anyone that has tested positive in the last 10 days please notify you surgeon.      Pre-operative 5 CHG Bath Instructions   You can play a key role in reducing the risk of infection after surgery. Your skin needs to be as free of germs as possible. You can reduce the number of germs on your skin by washing with CHG (chlorhexidine  gluconate) soap before surgery. CHG is an antiseptic soap that kills germs and continues to kill germs even after washing.   DO NOT use if you have an allergy to chlorhexidine /CHG or antibacterial soaps. If your skin becomes reddened or irritated, stop using the CHG and notify one of our RNs at [phone].   Please shower with the CHG soap starting 4 days before surgery using the following schedule:     Please keep in mind the following:  DO NOT shave, including legs and underarms, starting the day of your first shower.   You may shave your face at any point before/day of surgery.  Place clean sheets on your bed the day you start using CHG soap. Use a clean washcloth (not used  since being washed) for each shower. DO NOT sleep with pets once you start using the CHG.   CHG Shower Instructions:  If you choose to wash your hair and private area, wash first with your normal shampoo/soap.  After you use shampoo/soap, rinse your hair and body thoroughly to remove shampoo/soap residue.  Turn the water  OFF and apply about 3 tablespoons (45 ml) of CHG soap to a CLEAN washcloth.  Apply CHG soap ONLY FROM YOUR NECK  DOWN TO YOUR TOES (washing for 3-5 minutes)  DO NOT use CHG soap on face, private areas, open wounds, or sores.  Pay special attention to the area where your surgery is being performed.  If you are having back surgery, having someone wash your back for you may be helpful. Wait 2 minutes after CHG soap is applied, then you may rinse off the CHG soap.  Pat dry with a clean towel  Put on clean clothes/pajamas   If you choose to wear lotion, please use ONLY the CHG-compatible lotions on the back of this paper.     Additional instructions for the day of surgery: DO NOT APPLY any lotions, deodorants, cologne, or perfumes.   Put on clean/comfortable clothes.  Brush your teeth.  Ask your nurse before applying any prescription medications to the skin.      CHG Compatible Lotions   Aveeno Moisturizing lotion  Cetaphil Moisturizing Cream  Cetaphil Moisturizing Lotion  Clairol Herbal Essence Moisturizing Lotion, Dry Skin  Clairol Herbal Essence Moisturizing Lotion, Extra Dry Skin  Clairol Herbal Essence Moisturizing Lotion, Normal Skin  Curel Age Defying Therapeutic Moisturizing Lotion with Alpha Hydroxy  Curel Extreme Care Body Lotion  Curel Soothing Hands Moisturizing Hand Lotion  Curel Therapeutic Moisturizing Cream, Fragrance-Free  Curel Therapeutic Moisturizing Lotion, Fragrance-Free  Curel Therapeutic Moisturizing Lotion, Original Formula  Eucerin Daily Replenishing Lotion  Eucerin Dry Skin Therapy Plus Alpha Hydroxy Crme  Eucerin Dry Skin Therapy Plus Alpha Hydroxy Lotion  Eucerin Original Crme  Eucerin Original Lotion  Eucerin Plus Crme Eucerin Plus Lotion  Eucerin TriLipid Replenishing Lotion  Keri Anti-Bacterial Hand Lotion  Keri Deep Conditioning Original Lotion Dry Skin Formula Softly Scented  Keri Deep Conditioning Original Lotion, Fragrance Free Sensitive Skin Formula  Keri Lotion Fast Absorbing Fragrance Free Sensitive Skin Formula  Keri Lotion Fast Absorbing Softly  Scented Dry Skin Formula  Keri Original Lotion  Keri Skin Renewal Lotion Keri Silky Smooth Lotion  Keri Silky Smooth Sensitive Skin Lotion  Nivea Body Creamy Conditioning Oil  Nivea Body Extra Enriched Teacher, adult education Moisturizing Lotion Nivea Crme  Nivea Skin Firming Lotion  NutraDerm 30 Skin Lotion  NutraDerm Skin Lotion  NutraDerm Therapeutic Skin Cream  NutraDerm Therapeutic Skin Lotion  ProShield Protective Hand Cream  Provon moisturizing lotion

## 2023-10-21 ENCOUNTER — Other Ambulatory Visit: Payer: Self-pay | Admitting: Family Medicine

## 2023-10-21 DIAGNOSIS — R053 Chronic cough: Secondary | ICD-10-CM

## 2023-10-22 ENCOUNTER — Encounter (HOSPITAL_COMMUNITY)
Admission: RE | Disposition: A | Discharge status: Home / Self Care | Payer: Self-pay | Source: Ambulatory Visit | Attending: Specialist

## 2023-10-22 ENCOUNTER — Ambulatory Visit (HOSPITAL_COMMUNITY): Admitting: Certified Registered Nurse Anesthetist

## 2023-10-22 ENCOUNTER — Ambulatory Visit (HOSPITAL_BASED_OUTPATIENT_CLINIC_OR_DEPARTMENT_OTHER): Admitting: Certified Registered Nurse Anesthetist

## 2023-10-22 ENCOUNTER — Observation Stay (HOSPITAL_COMMUNITY)
Admission: RE | Admit: 2023-10-22 | Discharge: 2023-10-24 | Disposition: A | Discharge status: Home / Self Care | Source: Ambulatory Visit | Attending: Specialist | Admitting: Specialist

## 2023-10-22 ENCOUNTER — Other Ambulatory Visit: Payer: Self-pay

## 2023-10-22 ENCOUNTER — Encounter (HOSPITAL_COMMUNITY): Payer: Self-pay | Admitting: Specialist

## 2023-10-22 ENCOUNTER — Ambulatory Visit (HOSPITAL_COMMUNITY)

## 2023-10-22 DIAGNOSIS — Z8616 Personal history of COVID-19: Secondary | ICD-10-CM | POA: Diagnosis not present

## 2023-10-22 DIAGNOSIS — M1711 Unilateral primary osteoarthritis, right knee: Principal | ICD-10-CM | POA: Diagnosis present

## 2023-10-22 DIAGNOSIS — Z79899 Other long term (current) drug therapy: Secondary | ICD-10-CM | POA: Diagnosis not present

## 2023-10-22 DIAGNOSIS — M21161 Varus deformity, not elsewhere classified, right knee: Secondary | ICD-10-CM | POA: Diagnosis not present

## 2023-10-22 DIAGNOSIS — Z96652 Presence of left artificial knee joint: Secondary | ICD-10-CM | POA: Insufficient documentation

## 2023-10-22 DIAGNOSIS — E039 Hypothyroidism, unspecified: Secondary | ICD-10-CM

## 2023-10-22 DIAGNOSIS — Z7982 Long term (current) use of aspirin: Secondary | ICD-10-CM | POA: Insufficient documentation

## 2023-10-22 DIAGNOSIS — Z8673 Personal history of transient ischemic attack (TIA), and cerebral infarction without residual deficits: Secondary | ICD-10-CM | POA: Diagnosis not present

## 2023-10-22 DIAGNOSIS — G8918 Other acute postprocedural pain: Secondary | ICD-10-CM | POA: Diagnosis not present

## 2023-10-22 DIAGNOSIS — M24662 Ankylosis, left knee: Secondary | ICD-10-CM

## 2023-10-22 DIAGNOSIS — E785 Hyperlipidemia, unspecified: Secondary | ICD-10-CM

## 2023-10-22 DIAGNOSIS — Z471 Aftercare following joint replacement surgery: Secondary | ICD-10-CM | POA: Diagnosis not present

## 2023-10-22 DIAGNOSIS — Z96651 Presence of right artificial knee joint: Secondary | ICD-10-CM | POA: Diagnosis not present

## 2023-10-22 HISTORY — PX: TOTAL KNEE ARTHROPLASTY: SHX125

## 2023-10-22 SURGERY — ARTHROPLASTY, KNEE, TOTAL
Anesthesia: Regional | Site: Knee | Laterality: Right

## 2023-10-22 MED ORDER — SODIUM CHLORIDE 0.9 % IR SOLN
Status: DC | PRN
  Administered 2023-10-22: 500 mL
  Administered 2023-10-22: 3000 mL

## 2023-10-22 MED ORDER — ACETAMINOPHEN 500 MG PO TABS
1000.0000 mg | ORAL_TABLET | Freq: Four times a day (QID) | ORAL | Status: AC
  Administered 2023-10-22 – 2023-10-23 (×3): 1000 mg via ORAL
  Filled 2023-10-22 (×4): qty 2

## 2023-10-22 MED ORDER — BUPIVACAINE LIPOSOME 1.3 % IJ SUSP
INTRAMUSCULAR | Status: DC | PRN
  Administered 2023-10-22: 20 mL

## 2023-10-22 MED ORDER — DOCUSATE SODIUM 100 MG PO CAPS
100.0000 mg | ORAL_CAPSULE | Freq: Two times a day (BID) | ORAL | Status: DC
  Administered 2023-10-22 – 2023-10-24 (×4): 100 mg via ORAL
  Filled 2023-10-22 (×4): qty 1

## 2023-10-22 MED ORDER — HYDROMORPHONE HCL 2 MG/ML IJ SOLN
INTRAMUSCULAR | Status: AC
  Filled 2023-10-22: qty 1

## 2023-10-22 MED ORDER — HYDROMORPHONE HCL 1 MG/ML IJ SOLN
INTRAMUSCULAR | Status: DC | PRN
  Administered 2023-10-22: .3 mg via INTRAVENOUS
  Administered 2023-10-22: 1.2 mg via INTRAVENOUS
  Administered 2023-10-22: .5 mg via INTRAVENOUS

## 2023-10-22 MED ORDER — DEXAMETHASONE SODIUM PHOSPHATE 10 MG/ML IJ SOLN
INTRAMUSCULAR | Status: AC
  Filled 2023-10-22: qty 1

## 2023-10-22 MED ORDER — ASPIRIN 81 MG PO CHEW
81.0000 mg | CHEWABLE_TABLET | Freq: Two times a day (BID) | ORAL | Status: DC
Start: 2023-10-23 — End: 2023-10-24
  Administered 2023-10-23 – 2023-10-24 (×3): 81 mg via ORAL
  Filled 2023-10-22 (×3): qty 1

## 2023-10-22 MED ORDER — ALUM & MAG HYDROXIDE-SIMETH 200-200-20 MG/5ML PO SUSP: 30.0000 mL | ORAL | Status: DC | PRN

## 2023-10-22 MED ORDER — MIDAZOLAM HCL 5 MG/5ML IJ SOLN
INTRAMUSCULAR | Status: DC | PRN
  Administered 2023-10-22: 2 mg via INTRAVENOUS

## 2023-10-22 MED ORDER — CEFAZOLIN SODIUM-DEXTROSE 2-4 GM/100ML-% IV SOLN
2.0000 g | INTRAVENOUS | Status: AC
  Administered 2023-10-22: 2 g via INTRAVENOUS
  Filled 2023-10-22: qty 100

## 2023-10-22 MED ORDER — ONDANSETRON HCL 4 MG PO TABS: 4.0000 mg | ORAL_TABLET | Freq: Four times a day (QID) | ORAL | Status: DC | PRN

## 2023-10-22 MED ORDER — ROPIVACAINE HCL 5 MG/ML IJ SOLN
INTRAMUSCULAR | Status: DC | PRN
  Administered 2023-10-22: 25 mL via PERINEURAL

## 2023-10-22 MED ORDER — ONDANSETRON HCL 4 MG/2ML IJ SOLN: 4.0000 mg | Freq: Four times a day (QID) | INTRAMUSCULAR | Status: DC | PRN

## 2023-10-22 MED ORDER — DEXAMETHASONE SODIUM PHOSPHATE 10 MG/ML IJ SOLN
INTRAMUSCULAR | Status: DC | PRN
  Administered 2023-10-22: 10 mg via INTRAVENOUS

## 2023-10-22 MED ORDER — METOCLOPRAMIDE HCL 5 MG PO TABS: 5.0000 mg | ORAL_TABLET | Freq: Three times a day (TID) | ORAL | Status: DC | PRN

## 2023-10-22 MED ORDER — CEFAZOLIN SODIUM-DEXTROSE 2-4 GM/100ML-% IV SOLN
2.0000 g | Freq: Four times a day (QID) | INTRAVENOUS | Status: AC
  Administered 2023-10-22 (×2): 2 g via INTRAVENOUS
  Filled 2023-10-22 (×2): qty 100

## 2023-10-22 MED ORDER — ACETAMINOPHEN 10 MG/ML IV SOLN
1000.0000 mg | INTRAVENOUS | Status: AC
  Administered 2023-10-22: 1000 mg via INTRAVENOUS
  Filled 2023-10-22: qty 100

## 2023-10-22 MED ORDER — OXYCODONE HCL 5 MG/5ML PO SOLN: 5.0000 mg | Freq: Once | ORAL | Status: DC | PRN

## 2023-10-22 MED ORDER — LIDOCAINE HCL (CARDIAC) PF 100 MG/5ML IV SOSY
PREFILLED_SYRINGE | INTRAVENOUS | Status: DC | PRN
  Administered 2023-10-22: 60 mg via INTRATRACHEAL

## 2023-10-22 MED ORDER — MAGNESIUM CITRATE PO SOLN: 1.0000 | Freq: Once | ORAL | Status: DC | PRN

## 2023-10-22 MED ORDER — PHENOL 1.4 % MT LIQD: 1.0000 | OROMUCOSAL | Status: DC | PRN

## 2023-10-22 MED ORDER — ATORVASTATIN CALCIUM 20 MG PO TABS
20.0000 mg | ORAL_TABLET | Freq: Every day | ORAL | Status: DC
  Administered 2023-10-23 – 2023-10-24 (×2): 20 mg via ORAL
  Filled 2023-10-22 (×2): qty 1

## 2023-10-22 MED ORDER — SULFASALAZINE 500 MG PO TABS
1000.0000 mg | ORAL_TABLET | Freq: Two times a day (BID) | ORAL | Status: DC
  Administered 2023-10-22 – 2023-10-24 (×4): 1000 mg via ORAL
  Filled 2023-10-22 (×5): qty 2

## 2023-10-22 MED ORDER — LACTATED RINGERS IV SOLN
INTRAVENOUS | Status: DC
Start: 2023-10-22 — End: 2023-10-22

## 2023-10-22 MED ORDER — SODIUM CHLORIDE 0.9% FLUSH
INTRAVENOUS | Status: DC | PRN
  Administered 2023-10-22: 40 mL via INTRAVENOUS

## 2023-10-22 MED ORDER — MAGNESIUM OXIDE -MG SUPPLEMENT 400 (240 MG) MG PO TABS
200.0000 mg | ORAL_TABLET | Freq: Every day | ORAL | Status: DC
  Administered 2023-10-23 – 2023-10-24 (×2): 200 mg via ORAL
  Filled 2023-10-22 (×2): qty 1

## 2023-10-22 MED ORDER — FENTANYL CITRATE (PF) 100 MCG/2ML IJ SOLN
INTRAMUSCULAR | Status: AC
  Filled 2023-10-22: qty 2

## 2023-10-22 MED ORDER — BUPIVACAINE LIPOSOME 1.3 % IJ SUSP
INTRAMUSCULAR | Status: AC
  Filled 2023-10-22: qty 20

## 2023-10-22 MED ORDER — LACTATED RINGERS IV SOLN: INTRAVENOUS | Status: DC

## 2023-10-22 MED ORDER — BUPIVACAINE-EPINEPHRINE 0.25% -1:200000 IJ SOLN
INTRAMUSCULAR | Status: DC | PRN
  Administered 2023-10-22: 11 mL

## 2023-10-22 MED ORDER — BISACODYL 5 MG PO TBEC: 5.0000 mg | DELAYED_RELEASE_TABLET | Freq: Every day | ORAL | Status: DC | PRN

## 2023-10-22 MED ORDER — MENTHOL 3 MG MT LOZG: 1.0000 | LOZENGE | OROMUCOSAL | Status: DC | PRN

## 2023-10-22 MED ORDER — FENTANYL CITRATE PF 50 MCG/ML IJ SOSY: 25.0000 ug | PREFILLED_SYRINGE | INTRAMUSCULAR | Status: DC | PRN

## 2023-10-22 MED ORDER — POLYETHYLENE GLYCOL 3350 17 G PO PACK: 17.0000 g | PACK | Freq: Every day | ORAL | Status: DC | PRN

## 2023-10-22 MED ORDER — POLYVINYL ALCOHOL 1.4 % OP SOLN: Freq: Every day | OPHTHALMIC | Status: DC | PRN

## 2023-10-22 MED ORDER — FENTANYL CITRATE (PF) 100 MCG/2ML IJ SOLN
INTRAMUSCULAR | Status: DC | PRN
  Administered 2023-10-22: 25 ug via INTRAVENOUS
  Administered 2023-10-22: 50 ug via INTRAVENOUS
  Administered 2023-10-22: 25 ug via INTRAVENOUS

## 2023-10-22 MED ORDER — ONDANSETRON HCL 4 MG/2ML IJ SOLN
INTRAMUSCULAR | Status: DC | PRN
  Administered 2023-10-22: 4 mg via INTRAVENOUS

## 2023-10-22 MED ORDER — 0.9 % SODIUM CHLORIDE (POUR BTL) OPTIME
TOPICAL | Status: DC | PRN
  Administered 2023-10-22: 1000 mL

## 2023-10-22 MED ORDER — ORAL CARE MOUTH RINSE: 15.0000 mL | Freq: Once | OROMUCOSAL | Status: AC

## 2023-10-22 MED ORDER — LEVOTHYROXINE SODIUM 75 MCG PO TABS
75.0000 ug | ORAL_TABLET | Freq: Every day | ORAL | Status: DC
  Administered 2023-10-23 – 2023-10-24 (×2): 75 ug via ORAL
  Filled 2023-10-22 (×2): qty 1

## 2023-10-22 MED ORDER — OXYCODONE HCL 5 MG PO TABS
5.0000 mg | ORAL_TABLET | ORAL | Status: DC | PRN
  Administered 2023-10-22 (×2): 5 mg via ORAL
  Filled 2023-10-22 (×2): qty 1

## 2023-10-22 MED ORDER — TRANEXAMIC ACID-NACL 1000-0.7 MG/100ML-% IV SOLN
1000.0000 mg | INTRAVENOUS | Status: AC
  Administered 2023-10-22: 1000 mg via INTRAVENOUS
  Filled 2023-10-22: qty 100

## 2023-10-22 MED ORDER — SODIUM CHLORIDE (PF) 0.9 % IJ SOLN
INTRAMUSCULAR | Status: AC
  Filled 2023-10-22: qty 50

## 2023-10-22 MED ORDER — BUPIVACAINE-EPINEPHRINE (PF) 0.25% -1:200000 IJ SOLN
INTRAMUSCULAR | Status: AC
  Filled 2023-10-22: qty 30

## 2023-10-22 MED ORDER — OXYCODONE HCL 5 MG PO TABS
10.0000 mg | ORAL_TABLET | ORAL | Status: DC | PRN
  Administered 2023-10-22 – 2023-10-23 (×3): 10 mg via ORAL
  Filled 2023-10-22 (×3): qty 2

## 2023-10-22 MED ORDER — PANTOPRAZOLE SODIUM 40 MG PO TBEC
40.0000 mg | DELAYED_RELEASE_TABLET | Freq: Every day | ORAL | Status: DC
  Administered 2023-10-22 – 2023-10-24 (×3): 40 mg via ORAL
  Filled 2023-10-22 (×3): qty 1

## 2023-10-22 MED ORDER — STERILE WATER FOR IRRIGATION IR SOLN
Status: DC | PRN
Start: 2023-10-22 — End: 2023-10-22
  Administered 2023-10-22: 2000 mL

## 2023-10-22 MED ORDER — METOCLOPRAMIDE HCL 5 MG/ML IJ SOLN: 5.0000 mg | Freq: Three times a day (TID) | INTRAMUSCULAR | Status: DC | PRN

## 2023-10-22 MED ORDER — PREDNISONE 5 MG PO TABS
5.0000 mg | ORAL_TABLET | Freq: Every day | ORAL | Status: DC
  Administered 2023-10-22 – 2023-10-24 (×3): 5 mg via ORAL
  Filled 2023-10-22 (×3): qty 1

## 2023-10-22 MED ORDER — HYDROMORPHONE HCL 1 MG/ML IJ SOLN
0.5000 mg | INTRAMUSCULAR | Status: DC | PRN
  Administered 2023-10-22 (×2): 0.5 mg via INTRAVENOUS
  Administered 2023-10-23 (×3): 1 mg via INTRAVENOUS
  Filled 2023-10-22 (×6): qty 1

## 2023-10-22 MED ORDER — CHLORHEXIDINE GLUCONATE 0.12 % MT SOLN
15.0000 mL | Freq: Once | OROMUCOSAL | Status: AC
  Administered 2023-10-22: 15 mL via OROMUCOSAL

## 2023-10-22 MED ORDER — LIDOCAINE HCL (PF) 2 % IJ SOLN
INTRAMUSCULAR | Status: AC
  Filled 2023-10-22: qty 5

## 2023-10-22 MED ORDER — BACLOFEN 10 MG PO TABS: 10.0000 mg | ORAL_TABLET | Freq: Three times a day (TID) | ORAL | Status: DC | PRN

## 2023-10-22 MED ORDER — ONDANSETRON HCL 4 MG/2ML IJ SOLN
INTRAMUSCULAR | Status: AC
  Filled 2023-10-22: qty 2

## 2023-10-22 MED ORDER — DULOXETINE HCL 60 MG PO CPEP
60.0000 mg | ORAL_CAPSULE | Freq: Every day | ORAL | Status: DC
  Administered 2023-10-22 – 2023-10-23 (×2): 60 mg via ORAL
  Filled 2023-10-22 (×2): qty 1

## 2023-10-22 MED ORDER — MIDAZOLAM HCL 2 MG/2ML IJ SOLN
INTRAMUSCULAR | Status: AC
  Filled 2023-10-22: qty 2

## 2023-10-22 MED ORDER — OXYCODONE HCL 5 MG PO TABS: 5.0000 mg | ORAL_TABLET | Freq: Once | ORAL | Status: DC | PRN

## 2023-10-22 MED ORDER — RISAQUAD PO CAPS
1.0000 | ORAL_CAPSULE | Freq: Every day | ORAL | Status: DC
  Administered 2023-10-22 – 2023-10-24 (×3): 1 via ORAL
  Filled 2023-10-22 (×3): qty 1

## 2023-10-22 MED ORDER — PROPOFOL 10 MG/ML IV BOLUS
INTRAVENOUS | Status: DC | PRN
  Administered 2023-10-22: 150 mg via INTRAVENOUS

## 2023-10-22 MED ORDER — DIPHENHYDRAMINE HCL 12.5 MG/5ML PO ELIX
12.5000 mg | ORAL_SOLUTION | ORAL | Status: DC | PRN
  Administered 2023-10-22 – 2023-10-23 (×3): 25 mg via ORAL
  Filled 2023-10-22 (×3): qty 10

## 2023-10-22 SURGICAL SUPPLY — 59 items
ATTUNE MED DOME PAT 32 KNEE (Knees) IMPLANT
ATTUNE PS FEM RT SZ 3 CEM KNEE (Femur) IMPLANT
ATTUNE PSRP INSR SZ3 6 KNEE (Insert) IMPLANT
BAG COUNTER SPONGE SURGICOUNT (BAG) IMPLANT
BAG ZIPLOCK 12X15 (MISCELLANEOUS) IMPLANT
BASE TIBIAL ROT PLAT SZ 3 KNEE (Knees) IMPLANT
BLADE SAW SGTL 11.0X1.19X90.0M (BLADE) ×2 IMPLANT
BLADE SAW SGTL 13.0X1.19X90.0M (BLADE) ×2 IMPLANT
BLADE SURG SZ10 CARB STEEL (BLADE) ×4 IMPLANT
BNDG ELASTIC 4INX 5YD STR LF (GAUZE/BANDAGES/DRESSINGS) ×2 IMPLANT
BNDG ELASTIC 6INX 5YD STR LF (GAUZE/BANDAGES/DRESSINGS) ×2 IMPLANT
BOWL SMART MIX CTS (DISPOSABLE) ×2 IMPLANT
CEMENT HV SMART SET (Cement) ×4 IMPLANT
COVER SURGICAL LIGHT HANDLE (MISCELLANEOUS) ×2 IMPLANT
CUFF TRNQT CYL 34X4.125X (TOURNIQUET CUFF) ×2 IMPLANT
DRAPE INCISE IOBAN 66X45 STRL (DRAPES) IMPLANT
DRAPE SHEET LG 3/4 BI-LAMINATE (DRAPES) ×2 IMPLANT
DRAPE SURG ORHT 6 SPLT 77X108 (DRAPES) ×4 IMPLANT
DRAPE TOP 10253 STERILE (DRAPES) ×2 IMPLANT
DRAPE U-SHAPE 47X51 STRL (DRAPES) ×2 IMPLANT
DRSG AQUACEL AG ADV 3.5X10 (GAUZE/BANDAGES/DRESSINGS) ×2 IMPLANT
DURAPREP 26ML APPLICATOR (WOUND CARE) ×2 IMPLANT
ELECT BLADE TIP CTD 4 INCH (ELECTRODE) ×2 IMPLANT
ELECT PENCIL ROCKER SW 15FT (MISCELLANEOUS) ×2 IMPLANT
ELECT REM PT RETURN 15FT ADLT (MISCELLANEOUS) ×2 IMPLANT
EVACUATOR 1/8 PVC DRAIN (DRAIN) IMPLANT
GLOVE BIO SURGEON STRL SZ7 (GLOVE) ×2 IMPLANT
GLOVE BIOGEL PI IND STRL 7.0 (GLOVE) ×2 IMPLANT
GLOVE BIOGEL PI IND STRL 8 (GLOVE) ×2 IMPLANT
GLOVE SURG SS PI 8.0 STRL IVOR (GLOVE) ×2 IMPLANT
GOWN STRL REUS W/ TWL XL LVL3 (GOWN DISPOSABLE) ×4 IMPLANT
HEMOSTAT SPONGE AVITENE ULTRA (HEMOSTASIS) IMPLANT
HOLDER FOLEY CATH W/STRAP (MISCELLANEOUS) ×2 IMPLANT
IMMOBILIZER KNEE 20 (SOFTGOODS) ×1 IMPLANT
IMMOBILIZER KNEE 20 THIGH 36 (SOFTGOODS) ×2 IMPLANT
KIT TURNOVER KIT A (KITS) IMPLANT
MANIFOLD NEPTUNE II (INSTRUMENTS) ×2 IMPLANT
NS IRRIG 1000ML POUR BTL (IV SOLUTION) IMPLANT
PACK TOTAL KNEE CUSTOM (KITS) ×2 IMPLANT
PIN STEINMAN FIXATION KNEE (PIN) IMPLANT
PROTECTOR NERVE ULNAR (MISCELLANEOUS) ×2 IMPLANT
SAW OSC TIP CART 19.5X105X1.3 (SAW) IMPLANT
SEALER BIPOLAR AQUA 6.0 (INSTRUMENTS) IMPLANT
SET HNDPC FAN SPRY TIP SCT (DISPOSABLE) ×2 IMPLANT
SOLUTION PRONTOSAN WOUND 350ML (IRRIGATION / IRRIGATOR) ×2 IMPLANT
SPIKE FLUID TRANSFER (MISCELLANEOUS) ×2 IMPLANT
STAPLER VISISTAT (STAPLE) IMPLANT
STRIP CLOSURE SKIN 1/2X4 (GAUZE/BANDAGES/DRESSINGS) IMPLANT
SUT BONE WAX W31G (SUTURE) ×2 IMPLANT
SUT MNCRL AB 4-0 PS2 18 (SUTURE) IMPLANT
SUT VIC AB 0 CT1 36 (SUTURE) ×2 IMPLANT
SUT VIC AB 1 CT1 27XBRD ANTBC (SUTURE) ×6 IMPLANT
SUT VIC AB 2-0 CT1 TAPERPNT 27 (SUTURE) ×6 IMPLANT
SUTURE STRATFX 0 PDS 27 VIOLET (SUTURE) ×2 IMPLANT
TRAY FOLEY MTR SLVR 16FR STAT (SET/KITS/TRAYS/PACK) ×2 IMPLANT
TUBE SUCTION HIGH CAP CLEAR NV (SUCTIONS) ×2 IMPLANT
WATER STERILE IRR 1000ML POUR (IV SOLUTION) ×2 IMPLANT
WIPE CHG 2% PREP (PERSONAL CARE ITEMS) ×2 IMPLANT
WRAP KNEE MAXI GEL POST OP (GAUZE/BANDAGES/DRESSINGS) ×2 IMPLANT

## 2023-10-22 NOTE — Plan of Care (Signed)
  Problem: Clinical Measurements: Goal: Ability to maintain clinical measurements within normal limits will improve Outcome: Progressing Goal: Cardiovascular complication will be avoided Outcome: Progressing   Problem: Activity: Goal: Risk for activity intolerance will decrease Outcome: Progressing   

## 2023-10-22 NOTE — Care Plan (Signed)
 Ortho Bundle Case Management Note  Patient Details  Name: Becky Boyd MRN: [id number] Date of Birth: 09-02-1955                  RT TKA on 10/22/23  DCP: Home with daughter  DME: No needs, has RW  PT: EO 5/26   DME Arranged:  N/A DME Agency:       Additional Comments: Please contact me with any questions of if this plan should need to change.    Kathlene Paradise, Case Manager [phone] 10/22/2023, 9:46 AM

## 2023-10-22 NOTE — Discharge Instructions (Signed)

## 2023-10-22 NOTE — Transfer of Care (Signed)
 Immediate Anesthesia Transfer of Care Note  Patient: Becky Boyd  Procedure(s) Performed: ARTHROPLASTY, KNEE, TOTAL (Right: Knee)  Patient Location: PACU  Anesthesia Type:GA combined with regional for post-op pain  Level of Consciousness: awake and drowsy  Airway & Oxygen  Therapy: Patient Spontanous Breathing and Patient connected to face mask oxygen   Post-op Assessment: Report given to RN and Post -op Vital signs reviewed and stable  Post vital signs: Reviewed and stable  Last Vitals:  Vitals Value Taken Time  BP 167/86 10/22/23 1100  Temp    Pulse 100 10/22/23 1101  Resp 24 10/22/23 1101  SpO2 97 % 10/22/23 1101  Vitals shown include unfiled device data.  Last Pain:  Vitals:   10/22/23 0715  TempSrc: Oral         Complications: No notable events documented.

## 2023-10-22 NOTE — Anesthesia Procedure Notes (Signed)
 Anesthesia Regional Block: Adductor canal block   Pre-Anesthetic Checklist: , timeout performed,  Correct Patient, Correct Site, Correct Laterality,  Correct Procedure, Correct Position, site marked,  Risks and benefits discussed,  Surgical consent,  Pre-op evaluation,  At surgeon's request and post-op pain management  Laterality: Right  Prep: chloraprep       Needles:  Injection technique: Single-shot  Needle Type: Echogenic Needle     Needle Length: 9cm  Needle Gauge: 21     Additional Needles:   Narrative:  Start time: 10/22/2023 8:13 AM End time: 10/22/2023 8:22 AM Injection made incrementally with aspirations every 5 mL.  Performed by: Personally  Anesthesiologist: Ellena Gurney, MD  Additional Notes: Pt tolerated the procedure well.

## 2023-10-22 NOTE — Anesthesia Procedure Notes (Signed)
 Procedure Name: LMA Insertion Date/Time: 10/22/2023 8:39 AM  Performed by: [place], Avi Lemma, CRNAPre-anesthesia Checklist: Patient identified, Emergency Drugs available, Suction available and Patient being monitored Patient Re-evaluated:Patient Re-evaluated prior to induction Oxygen  Delivery Method: Circle system utilized Preoxygenation: Pre-oxygenation with 100% oxygen  Induction Type: IV induction Ventilation: Mask ventilation without difficulty LMA: LMA inserted LMA Size: 4.0 Number of attempts: 1 Airway Equipment and Method: Bite block Placement Confirmation: positive ETCO2 Tube secured with: Tape Dental Injury: Teeth and Oropharynx as per pre-operative assessment

## 2023-10-22 NOTE — Interval H&P Note (Signed)
 History and Physical Interval Note:  10/22/2023 8:16 AM  Becky Boyd  has presented today for surgery, with the diagnosis of Right knee degenerative joint disease, arthrofibrosis left knee.  The various methods of treatment have been discussed with the patient and family. After consideration of risks, benefits and other options for treatment, the patient has consented to  Procedure(s) with comments: ARTHROPLASTY, KNEE, TOTAL (Right) - Right total knee arthroplasty, left knee exam under anesthesia, manipulation under anesthesia as a surgical intervention.  The patient's history has been reviewed, patient examined, no change in status, stable for surgery.  I have reviewed the patient's chart and labs.  Questions were answered to the patient's satisfaction.     [name]

## 2023-10-22 NOTE — Op Note (Signed)
 Becky Boyd, DEBROUX MEDICAL RECORD NO: [id number] ACCOUNT NO: [id number] DATE OF BIRTH: 09-Jun-1955 FACILITY: [facility] LOCATION: [id number] PHYSICIAN: [name], MD  Operative Report   DATE OF PROCEDURE: 10/22/2023  PREOPERATIVE DIAGNOSES: 1.  End-stage osteoarthrosis of the right knee with varus deformity. 2.  Status post total knee replacement on the left with arthrofibrosis.  POSTOPERATIVE DIAGNOSES: 1.  End-stage osteoarthrosis of the right knee with varus deformity. 2.  Status post total knee replacement on the left with arthrofibrosis.  PROCEDURE PERFORMED: 1.  Manipulation under anesthesia, left knee. 2.  Right total knee arthroplasty utilizing Depuy Attune rotating platform, 3 femur, 3 tibia, 6 mm insert, 32 patella.  ANESTHESIA:  General.  ASSISTANT:  Jaqueline Bissell, PA  HISTORY:  This is a 68 year old end-stage osteoarthrosis, medial compartment, varus deformity of the right knee.  Indicated for placement of the degenerated joint.  In addition, the patient is status post total knee arthroplasty on the left, had a range  of motion of 0 to 90, requesting manipulation.  Risks and benefits discussed including bleeding, infection, damage to neurovascular structures, no change in symptoms, worsening symptoms, DVT, PE, anesthetic complications, etc.  DESCRIPTION OF PROCEDURE:  The patient was in the supine position.  After induction of adequate general anesthesia, 2 g of Kefzol , the left knee was gently manipulated.  With a gentle force applied to the knee in flexion, grasping the tibia proximally,  mobilizing the patella and flexing the hip, we applied a gentle manipulative maneuver and were able to achieve 120 degrees of flexion.  Full extension.  Negative anterior drawer, stable following the exam.  No complications noted.  The right lower extremity was prepped, draped, and exsanguinated in the usual sterile fashion.  Thigh tourniquet inflated to 225 mmHg.  A midline incision was  then made over the knee.  Full-thickness flaps of the medial parapatellar arthrotomy performed.   Patella was gently everted.  The knee was flexed.  Tricompartmental osteoarthrosis was noted.  Bone-on-bone medial compartment and patellofemoral joint.  Remnants of medial and lateral menisci were removed.  The notch was placed above the femoral notch  as a starting point for the femoral drill, which was drilled in line with the femur.  Entering the femoral canal, which was then irrigated, inserted a T-handle for confirmation.  Intramedullary guide 5-degree right with 9 off the distal femur due to a  slight flexion contracture was pinned.  I then performed a distal femoral cut.  We then sized off the anterior aspect of the femur to a 3.  Utilizing 3 degrees of external rotation, this was then pinned in line with the transepicondylar access.  Placed  our distal femoral block.  I then performed anterior, posterior, and chamfer cuts without notching the anterior femur.  I then turned attention to the tibia.  It was subluxed.  Bone on bone was noted medially.  External alignment guide throughout the  defect, which was medial bisecting tibiotalar joint 3-degree slope parallel to the shaft.  This was pinned.  I performed a tibial cut.  Further remnants of the menisci excised.  Extension block was trialed at a 6 in full extension, satisfactory.   Reflexed the knee, subluxed the tibia for preparation, a 3 plate just to the inside of the middle third of the tibial tubercle, pinned, harvested bone centrally and impacted in the distal femur.  Used a central drill and then our punch guide.  Turned  attention back to the femur.  Box cut jig was  then applied, bisecting the canals.  I performed a box cut.  Then rasped the box.  Placed trial femur.  It fit flush.  Placed a 6 insert and reduced it.  I had full extension, full flexion, good stability to  varus and valgus stressing at 0 and 30 degrees and a negative anterior  drawer.  Next, everted the patella, removed osteophytes, measured to a 20 and plane to a 13 utilizing tailored jig.  Size to a 32 with a trial paddle parallel to the joint space.   Trial placed reduced and excellent patellofemoral tracking.  All trials were then removed.  I used Exparel  through the posterior capsule, aspirating without a break in a vacuum, injecting 10 mL and then anesthetized the medial and lateral menisci,  periosteum of the femur, proximal tibia, quadriceps, etc.  Then used pulsatile lavage to thoroughly clean all bony surfaces and mixed cement on the vacuum table under centrifuge and vacuum.  Knee was flexed.  All surfaces thoroughly dried.  Cement was placed in the proximal tibia digitally pressurizing it.   Cement was placed in the tibial tray and then tray was impacted into place.  Redundant cement removed.  Cemented the femoral component with cement on the femur and the component.  Fit flush.  Redundant cement removed.  I placed a 6 insert, reduced the  knee in full extension, held axial load throughout the curing of the cement.  The cement clamped the patella as well.  Marcaine  with epinephrine  was placed in the wound as was the Prontosan.  The wound was covered during the curing of the cement.  At  which point, the tourniquet was deflated at 50 minutes.  Bleeding was cauterized with an Aquamantys.  I had performed a lateral release as well.  I removed the insert and meticulously removed all the redundant cement.  Copiously irrigated with pulsatile  lavage followed by Prontosan.  A 6 polyethylene insert was then inserted and reduced.  I had full extension, full flexion, good stability to varus or valgus stress at 0 or 30 degrees.  Negative anterior drawer.  Then in slight flexion, I repaired the  arthrotomy with #1 Vicryl interrupted figure-of-eight sutures and then this was oversewn with a running Stratafix.  Following this, I had excellent patellofemoral tracking and full flexion  and full extension and good stability.  Next, copiously irrigated subcutaneous tissue and then Prontosan, subcu with 2-0 and the skin with subcuticular Monocryl.  Sterile dressing applied, placed in an immobilizer, extubated, and transported to the recovery room in satisfactory condition.  The patient tolerated the procedure well.  There were no complications.  Assistant Levi Real, [place] was used throughout the case for patient positioning, traction, and closure.  BLOOD LOSS: 50 mL.   PUS D: 10/22/2023 10:28:25 am T: 10/22/2023 6:06:00 pm  JOB: [id number]/ [id number]

## 2023-10-22 NOTE — Interval H&P Note (Signed)
 History and Physical Interval Note:  10/22/2023 8:28 AM  Becky Boyd  has presented today for surgery, with the diagnosis of Right knee degenerative joint disease, arthrofibrosis left knee.  The various methods of treatment have been discussed with the patient and family. After consideration of risks, benefits and other options for treatment, the patient has consented to  Procedure(s) with comments: ARTHROPLASTY, KNEE, TOTAL (Right) - Right total knee arthroplasty, left knee exam under anesthesia, manipulation under anesthesia as a surgical intervention.  The patient's history has been reviewed, patient examined, no change in status, stable for surgery.  I have reviewed the patient's chart and labs.  Questions were answered to the patient's satisfaction.     [name]

## 2023-10-22 NOTE — Brief Op Note (Signed)
 10/22/2023  10:20 AM  PATIENT:  Becky Boyd  68 y.o. female  PRE-OPERATIVE DIAGNOSIS:  Right knee degenerative joint disease, arthrofibrosis left knee  POST-OPERATIVE DIAGNOSIS:  Right knee degenerative joint disease, arthrofibrosis left knee  PROCEDURE:  Procedure(s) with comments: ARTHROPLASTY, KNEE, TOTAL (Right) - Right total knee arthroplasty, left knee exam under anesthesia, manipulation under anesthesia The aquamantis was utilized for this case to help facilitate better hemostasis as patient was felt to be at increased risk of bleeding because of recent anticoagulation use.   SURGEON:  Surgeons and Role:    * Orvan Blanch, MD - Primary  PHYSICIAN ASSISTANT:   ASSISTANTS: Bissell   ANESTHESIA:   general  EBL:  50 mL   BLOOD ADMINISTERED:none  DRAINS: none   LOCAL MEDICATIONS USED:  MARCAINE      SPECIMEN:  No Specimen  DISPOSITION OF SPECIMEN:  N/A  COUNTS:  YES  TOURNIQUET:  50 min  DICTATION: .Other Dictation: Dictation Number [id number]  PLAN OF CARE: Admit for overnight observation  PATIENT DISPOSITION:  PACU - hemodynamically stable.   Delay start of Pharmacological VTE agent (>24hrs) due to surgical blood loss or risk of bleeding: no

## 2023-10-22 NOTE — Progress Notes (Signed)
 PT Cancellation Note  Patient Details Name: Becky Boyd MRN: [id number] DOB: April 03, 1956   Cancelled Treatment:    Reason Eval/Treat Not Completed: Pain limiting ability to participate. Pt reports 10/10 pain. Nursing aware and provided PO medication and then IV pain medication with no resolve. PT to continue to follow acutely.   Cary Clarks, PT Acute Rehab   Annalee Kiang 10/22/2023, 5:34 PM

## 2023-10-22 NOTE — Anesthesia Preprocedure Evaluation (Signed)
 Anesthesia Evaluation  Patient identified by MRN, date of birth, ID band Patient awake    Reviewed: Allergy & Precautions, H&P , NPO status , Patient's Chart, lab work & pertinent test results  Airway Mallampati: II   Neck ROM: full    Dental   Pulmonary neg pulmonary ROS   breath sounds clear to auscultation       Cardiovascular negative cardio ROS  Rhythm:regular Rate:Normal     Neuro/Psych TIA Neuromuscular disease    GI/Hepatic ,GERD  ,,  Endo/Other  Hypothyroidism    Renal/GU stones     Musculoskeletal  (+) Arthritis ,  Fibromyalgia -  Abdominal   Peds  Hematology   Anesthesia Other Findings   Reproductive/Obstetrics                             Anesthesia Physical Anesthesia Plan  ASA: 2  Anesthesia Plan: Spinal and MAC   Post-op Pain Management: Regional block*   Induction: Intravenous  PONV Risk Score and Plan: 2 and Propofol  infusion and Treatment may vary due to age or medical condition  Airway Management Planned: Simple Face Mask  Additional Equipment:   Intra-op Plan:   Post-operative Plan:   Informed Consent: I have reviewed the patients History and Physical, chart, labs and discussed the procedure including the risks, benefits and alternatives for the proposed anesthesia with the patient or authorized representative who has indicated his/her understanding and acceptance.     Dental advisory given  Plan Discussed with: CRNA, Anesthesiologist and Surgeon  Anesthesia Plan Comments:        Anesthesia Quick Evaluation

## 2023-10-23 DIAGNOSIS — M21161 Varus deformity, not elsewhere classified, right knee: Secondary | ICD-10-CM | POA: Diagnosis not present

## 2023-10-23 DIAGNOSIS — E039 Hypothyroidism, unspecified: Secondary | ICD-10-CM | POA: Diagnosis not present

## 2023-10-23 DIAGNOSIS — Z8673 Personal history of transient ischemic attack (TIA), and cerebral infarction without residual deficits: Secondary | ICD-10-CM | POA: Diagnosis not present

## 2023-10-23 DIAGNOSIS — Z8616 Personal history of COVID-19: Secondary | ICD-10-CM | POA: Diagnosis not present

## 2023-10-23 DIAGNOSIS — Z7982 Long term (current) use of aspirin: Secondary | ICD-10-CM | POA: Diagnosis not present

## 2023-10-23 DIAGNOSIS — Z79899 Other long term (current) drug therapy: Secondary | ICD-10-CM | POA: Diagnosis not present

## 2023-10-23 DIAGNOSIS — Z96652 Presence of left artificial knee joint: Secondary | ICD-10-CM | POA: Diagnosis not present

## 2023-10-23 DIAGNOSIS — M1711 Unilateral primary osteoarthritis, right knee: Secondary | ICD-10-CM | POA: Diagnosis not present

## 2023-10-23 LAB — CBC
HCT: 35.1 % — ABNORMAL LOW (ref 36.0–46.0)
Hemoglobin: 10.9 g/dL — ABNORMAL LOW (ref 12.0–15.0)
MCH: 32.4 pg (ref 26.0–34.0)
MCHC: 31.1 g/dL (ref 30.0–36.0)
MCV: 104.5 fL — ABNORMAL HIGH (ref 80.0–100.0)
Platelets: 185 10*3/uL (ref 150–400)
RBC: 3.36 MIL/uL — ABNORMAL LOW (ref 3.87–5.11)
RDW: 13.9 % (ref 11.5–15.5)
WBC: 10.1 10*3/uL (ref 4.0–10.5)
nRBC: 0 % (ref 0.0–0.2)

## 2023-10-23 LAB — BASIC METABOLIC PANEL WITH GFR
Anion gap: 9 (ref 5–15)
BUN: 10 mg/dL (ref 8–23)
CO2: 25 mmol/L (ref 22–32)
Calcium: 8.7 mg/dL — ABNORMAL LOW (ref 8.9–10.3)
Chloride: 97 mmol/L — ABNORMAL LOW (ref 98–111)
Creatinine, Ser: 0.86 mg/dL (ref 0.44–1.00)
GFR, Estimated: >60 mL/min (ref >60)
Glucose, Bld: 94 mg/dL (ref 70–99)
Potassium: 3.1 mmol/L — ABNORMAL LOW (ref 3.5–5.1)
Sodium: 131 mmol/L — ABNORMAL LOW (ref 135–145)

## 2023-10-23 MED ORDER — GABAPENTIN 300 MG PO CAPS
300.0000 mg | ORAL_CAPSULE | Freq: Two times a day (BID) | ORAL | Status: DC | PRN
  Administered 2023-10-23: 300 mg via ORAL
  Filled 2023-10-23: qty 1

## 2023-10-23 MED ORDER — HYDROMORPHONE HCL 2 MG PO TABS: 2.0000 mg | ORAL_TABLET | ORAL | Status: DC | PRN

## 2023-10-23 MED ORDER — HYDROMORPHONE HCL 2 MG PO TABS
2.0000 mg | ORAL_TABLET | ORAL | Status: DC | PRN
  Administered 2023-10-23 – 2023-10-24 (×4): 4 mg via ORAL
  Filled 2023-10-23 (×4): qty 2

## 2023-10-23 MED ORDER — KETOROLAC TROMETHAMINE 15 MG/ML IJ SOLN
15.0000 mg | Freq: Four times a day (QID) | INTRAMUSCULAR | Status: DC | PRN
  Administered 2023-10-24: 15 mg via INTRAVENOUS
  Filled 2023-10-23: qty 1

## 2023-10-23 MED ORDER — HYDRALAZINE HCL 20 MG/ML IJ SOLN: 10.0000 mg | Freq: Four times a day (QID) | INTRAMUSCULAR | Status: DC | PRN

## 2023-10-23 MED ORDER — POTASSIUM CHLORIDE CRYS ER 20 MEQ PO TBCR
40.0000 meq | EXTENDED_RELEASE_TABLET | Freq: Every day | ORAL | Status: AC
  Administered 2023-10-23 – 2023-10-24 (×2): 40 meq via ORAL
  Filled 2023-10-23 (×2): qty 2

## 2023-10-23 NOTE — Progress Notes (Signed)
 Physical Therapy Treatment Patient Details Name: Becky Boyd MRN: [id number] DOB: July 27, 1955 Today's Date: 10/23/2023   History of Present Illness 68 yo female presents to therapy s/p R TKA on 10/22/2023 due to failure of conservative measures. Pt PMH includes but is not limited to: aneurysm, anorexia, dysphagia, fibromyalgia, GERD, hashimoto thyroiditis, kidney stones, hypothyroidism, lupus, RA, sjogren's dz, cervical spine fusion and rod placement, lumbar surgery, and L TKA (2022).    PT Comments  Pt in bed, waking from a nap when PT arrives. Pt states that she needs to use the restroom, assisted to and from. Pt has decreased knee extension with amb this session, continues to lack extension throughout the session, present this morning. Pillow placed under R ankle at the end of the session to assist with restoring ROM. Pt is able to complete HEP, lacks AROM knee extension/hip extension in open chain, assisted with belt and encouraged to complete volitional contraction with each rep to progress coordination. Pt demos and verbalizes understanding. Educated on pain response to exercises and modifying intensity accordingly.    If plan is discharge home, recommend the following: A little help with walking and/or transfers;A little help with bathing/dressing/bathroom;Assistance with cooking/housework;Assist for transportation;Help with stairs or ramp for entrance   Can travel by private vehicle        Equipment Recommendations  None recommended by PT    Recommendations for Other Services       Precautions / Restrictions Precautions Precautions: Fall Recall of Precautions/Restrictions: Intact Restrictions Weight Bearing Restrictions Per Provider Order: Yes RLE Weight Bearing Per Provider Order: Weight bearing as tolerated     Mobility  Bed Mobility Overal bed mobility: Needs Assistance Bed Mobility: Supine to Sit, Sit to Supine     Supine to sit: Contact guard Sit to supine: Contact  guard assist   General bed mobility comments: assist for RLE in/out of bed-uses gait belt    Transfers Overall transfer level: Needs assistance Equipment used: Rolling walker (2 wheels) Transfers: Sit to/from Stand             General transfer comment: sit to stand completed at SBA with inc time, stand to sit supervision A    Ambulation/Gait Ambulation/Gait assistance: Supervision Gait Distance (Feet): 40 Feet Assistive device: Rolling walker (2 wheels) Gait Pattern/deviations: Step-to pattern, Decreased stride length, Decreased stance time - right, Wide base of support, Antalgic, Knee flexed in stance - right Gait velocity: dec     General Gait Details: Pt amb to the bathroom, lacks terminal knee extension in stance phase, pt demonstrates 20 degrees knee flexion amb this afternoon   Stairs             Wheelchair Mobility     Tilt Bed    Modified Rankin (Stroke Patients Only)       Balance Overall balance assessment: Needs assistance Sitting-balance support: Feet supported, No upper extremity supported Sitting balance-Leahy Scale: Fair     Standing balance support: Bilateral upper extremity supported, During functional activity, Reliant on assistive device for balance Standing balance-Leahy Scale: Poor                              Communication Communication Communication: No apparent difficulties  Cognition Arousal: Alert Behavior During Therapy: WFL for tasks assessed/performed   PT - Cognitive impairments: No apparent impairments  Following commands: Intact      Cueing Cueing Techniques: Verbal cues, Gestural cues  Exercises Total Joint Exercises Ankle Circles/Pumps: AROM, 20 reps, Right, Supine Quad Sets: AROM, 10 reps, Right, Supine Heel Slides: AAROM, 10 reps, Right, Supine Hip ABduction/ADduction: AAROM, Right, 5 reps Straight Leg Raises: 5 reps, AAROM, Right, Supine Long Arc Quad: AAROM,  Right, 10 reps, Seated Knee Flexion: 10 reps, AROM, Seated, Right    General Comments        Pertinent Vitals/Pain Pain Assessment Pain Assessment: 0-10 Pain Score: 10-Worst pain ever Pain Location: R knee Pain Descriptors / Indicators: Aching, Burning, Operative site guarding, Constant, Sharp Pain Intervention(s): Limited activity within patient's tolerance, Monitored during session, Repositioned, Ice applied, Patient requesting pain meds-RN notified    Home Living                          Prior Function            PT Goals (current goals can now be found in the care plan section) Acute Rehab PT Goals Patient Stated Goal: return home PT Goal Formulation: With patient Time For Goal Achievement: 11/06/23 Potential to Achieve Goals: Good Progress towards PT goals: Progressing toward goals    Frequency    7X/week      PT Plan      Co-evaluation              AM-PAC PT "6 Clicks" Mobility   Outcome Measure  Help needed turning from your back to your side while in a flat bed without using bedrails?: A Little Help needed moving from lying on your back to sitting on the side of a flat bed without using bedrails?: A Little Help needed moving to and from a bed to a chair (including a wheelchair)?: A Little Help needed standing up from a chair using your arms (e.g., wheelchair or bedside chair)?: A Little Help needed to walk in hospital room?: A Little Help needed climbing 3-5 steps with a railing? : A Lot 6 Click Score: 17    End of Session Equipment Utilized During Treatment: Gait belt;Oxygen  Activity Tolerance: Patient tolerated treatment well Patient left: in bed;with bed alarm set;with call bell/phone within reach Nurse Communication: Mobility status PT Visit Diagnosis: Difficulty in walking, not elsewhere classified (R26.2);Pain Pain - Right/Left: Right Pain - part of body: Knee     Time: 0981-1914 PT Time Calculation (min) (ACUTE ONLY): 38  min  Charges:    $Gait Training: 8-22 mins $Therapeutic Exercise: 23-37 mins PT General Charges $$ ACUTE PT VISIT: 1 Visit                     Darien Eden, PT Acute Rehabilitation Services Office: [phone] 10/23/2023    Serafin Dames 10/23/2023, 4:10 PM

## 2023-10-23 NOTE — Evaluation (Signed)
 Physical Therapy Evaluation Patient Details Name: Becky Boyd MRN: [id number] DOB: 26-Dec-1955 Today's Date: 10/23/2023  History of Present Illness  68 yo female presents to therapy s/p R TKA on 10/22/2023 due to failure of conservative measures. Pt PMH includes but is not limited to: aneurysm, anorexia, dysphagia, fibromyalgia, GERD, hashimoto thyroiditis, kidney stones, hypothyroidism, lupus, RA, sjogren's dz, cervical spine fusion and rod placement, lumbar surgery, and L TKA (2022).  Clinical Impression  Pt is s/p TKA resulting in the deficits listed below (see PT Problem List). Pt approached this morning and was in significant pain, delayed session to now (1 hr), pt agreeable and motivated to participate in session. She states that she has steps at home, but will be going to her sisters house which she has done previously. Pt requires min A for RLE to enter/exit bed, able to stand at SBA and amb x131ft/sits with supervision A. Pt defers exercise program to later today, will follow up. Pt is unable to maintain O2 sats >92% without 2L O2. Sats 100% with amb on 2L via [place]. Pt will benefit from acute skilled PT to increase their independence and safety with mobility to allow discharge.          If plan is discharge home, recommend the following: A little help with walking and/or transfers;A little help with bathing/dressing/bathroom;Assistance with cooking/housework;Assist for transportation;Help with stairs or ramp for entrance   Can travel by private vehicle        Equipment Recommendations None recommended by PT  Recommendations for Other Services       Functional Status Assessment Patient has had a recent decline in their functional status and demonstrates the ability to make significant improvements in function in a reasonable and predictable amount of time.     Precautions / Restrictions Precautions Precautions: Fall Recall of Precautions/Restrictions: Intact Restrictions Weight  Bearing Restrictions Per Provider Order: Yes RLE Weight Bearing Per Provider Order: Weight bearing as tolerated      Mobility  Bed Mobility Overal bed mobility: Needs Assistance Bed Mobility: Supine to Sit, Sit to Supine     Supine to sit: Contact guard Sit to supine: Contact guard assist   General bed mobility comments: assist for RLE in/out of bed-uses gait belt    Transfers Overall transfer level: Needs assistance Equipment used: Rolling walker (2 wheels) Transfers: Sit to/from Stand             General transfer comment: sit to stand completed at SBA with inc time, stand to sit supervision A    Ambulation/Gait Ambulation/Gait assistance: Supervision Gait Distance (Feet): 130 Feet Assistive device: Rolling walker (2 wheels) Gait Pattern/deviations: Step-to pattern, Decreased stride length, Decreased stance time - right, Wide base of support, Antalgic Gait velocity: dec     General Gait Details: pt uses step to patern with RLE lead and has inc reliance on AD for mobility, no LOB and good directional changes  Stairs            Wheelchair Mobility     Tilt Bed    Modified Rankin (Stroke Patients Only)       Balance Overall balance assessment: Needs assistance Sitting-balance support: Feet supported, No upper extremity supported Sitting balance-Leahy Scale: Good     Standing balance support: Bilateral upper extremity supported, During functional activity, Reliant on assistive device for balance Standing balance-Leahy Scale: Poor  Pertinent Vitals/Pain Pain Assessment Pain Assessment: 0-10 Pain Score: 10-Worst pain ever Pain Location: R knee Pain Descriptors / Indicators: Aching, Burning, Operative site guarding, Constant Pain Intervention(s): Limited activity within patient's tolerance, Monitored during session, Repositioned, Premedicated before session, Ice applied    Home Living Family/patient  expects to be discharged to:: Private residence (sisters house) Living Arrangements: Spouse/significant other Available Help at Discharge: Family Type of Home: House Home Access: Level entry       Home Layout: One level Home Equipment: Agricultural consultant (2 wheels);Cane - single point;BSC/3in1      Prior Function Prior Level of Function : Independent/Modified Independent                     Extremity/Trunk Assessment   Upper Extremity Assessment Upper Extremity Assessment: Overall WFL for tasks assessed    Lower Extremity Assessment Lower Extremity Assessment: RLE deficits/detail RLE: Unable to fully assess due to pain RLE Coordination: WNL    Cervical / Trunk Assessment Cervical / Trunk Assessment: Normal  Communication   Communication Communication: No apparent difficulties    Cognition Arousal: Alert Behavior During Therapy: WFL for tasks assessed/performed   PT - Cognitive impairments: No apparent impairments                         Following commands: Intact       Cueing Cueing Techniques: Verbal cues, Gestural cues     General Comments      Exercises Total Joint Exercises Quad Sets: AROM, 10 reps, Right, Supine   Assessment/Plan    PT Assessment Patient needs continued PT services  PT Problem List Decreased strength;Decreased activity tolerance;Decreased mobility;Decreased range of motion;Decreased balance       PT Treatment Interventions DME instruction;Functional mobility training;Balance training;Patient/family education;Gait training;Therapeutic activities;Therapeutic exercise;Stair training    PT Goals (Current goals can be found in the Care Plan section)  Acute Rehab PT Goals Patient Stated Goal: return home PT Goal Formulation: With patient Time For Goal Achievement: 11/06/23 Potential to Achieve Goals: Good    Frequency 7X/week     Co-evaluation               AM-PAC PT "6 Clicks" Mobility  Outcome Measure  Help needed turning from your back to your side while in a flat bed without using bedrails?: A Little Help needed moving from lying on your back to sitting on the side of a flat bed without using bedrails?: A Little Help needed moving to and from a bed to a chair (including a wheelchair)?: A Little Help needed standing up from a chair using your arms (e.g., wheelchair or bedside chair)?: A Little Help needed to walk in hospital room?: A Little Help needed climbing 3-5 steps with a railing? : A Lot 6 Click Score: 17    End of Session Equipment Utilized During Treatment: Gait belt;Oxygen  (2L) Activity Tolerance: Patient tolerated treatment well Patient left: in bed;with bed alarm set;with family/visitor present;with call bell/phone within reach Nurse Communication: Mobility status PT Visit Diagnosis: Difficulty in walking, not elsewhere classified (R26.2);Pain Pain - Right/Left: Right Pain - part of body: Knee    Time: 1610-9604 PT Time Calculation (min) (ACUTE ONLY): 33 min   Charges:   PT Evaluation $PT Eval Low Complexity: 1 Low PT Treatments $Gait Training: 8-22 mins PT General Charges $$ ACUTE PT VISIT: 1 Visit         Darien Eden, PT Acute Rehabilitation Services Office: [phone] 10/23/2023  Serafin Dames 10/23/2023, 12:12 PM

## 2023-10-23 NOTE — Progress Notes (Addendum)
 Subjective: 1 Day Post-Op Procedure(s) (LRB): ARTHROPLASTY, KNEE, TOTAL (Right) Patient reports pain as 9/10.   Denies CP or SOB.  Voiding without difficulty. Positive flatus. Objective: Vital signs in last 24 hours: Temp:  [97.5 F (36.4 C)-98.6 F (37 C)] 98.2 F (36.8 C) (05/22 0533) Pulse Rate:  [79-112] 84 (05/22 0533) Resp:  [9-24] 17 (05/22 0533) BP: (118-167)/(61-88) 164/77 (05/22 0533) SpO2:  [92 %-100 %] 93 % (05/22 0533)  Intake/Output from previous day: 05/21 0701 - 05/22 0700 In: 1857.5 [I.V.:1500; IV Piggyback:357.5] Out: 1675 [Urine:1625; Blood:50] Intake/Output this shift: No intake/output data recorded.  Recent Labs    10/23/23 0351  HGB 10.9*   Recent Labs    10/23/23 0351  WBC 10.1  RBC 3.36*  HCT 35.1*  PLT 185   Recent Labs    10/23/23 0351  NA 131*  K 3.1*  CL 97*  CO2 25  BUN 10  CREATININE 0.86  GLUCOSE 94  CALCIUM  8.7*   No results for input(s): "LABPT", "INR" in the last 72 hours.  Neurologically intact Neurovascular intact Sensation intact distally Intact pulses distally Dorsiflexion/Plantar flexion intact Incision: dressing C/D/I Compartment soft No DVT  Assessment/Plan:  1 Day Post-Op Procedure(s) (LRB): ARTHROPLASTY, KNEE, TOTAL (Right) Advance diet Up with therapy Discharge home with home health tomorrow K+ 3.1 . Increase diet K+ Dilaudid . On home opiod Toradol   Principal Problem:   Right knee DJD      [name] 10/23/2023, @[facility] 

## 2023-10-23 NOTE — TOC Transition Note (Signed)
 Transition of Care [facility]) - Discharge Note   Patient Details  Name: Becky Boyd MRN: [id number] Date of Birth: November 10, 1955  Transition of [facility]) CM/SW Contact:  Bari Leys, RN Phone Number: 10/23/2023, 11:12 AM   Clinical Narrative:  Met with patient at bedside to review dc therapy and home equipment plans, pt confirmed OPPT at EO, has RW, no home DME needs. No TOC needs.      Final next level of care: OP Rehab     Patient Goals and CMS Choice Patient states their goals for this hospitalization and ongoing recovery are:: return home          Discharge Placement                       Discharge Plan and Services Additional resources added to the After Visit Summary for                  DME Arranged: N/A                    Social Drivers of Health (SDOH) Interventions SDOH Screenings   Food Insecurity: No Food Insecurity (10/22/2023)  Housing: Low Risk  (10/22/2023)  Transportation Needs: No Transportation Needs (10/22/2023)  Utilities: Not At Risk (10/22/2023)  Alcohol  Screen: Low Risk  (01/23/2023)  Depression (PHQ2-9): Low Risk  (06/09/2023)  Financial Resource Strain: Low Risk  (01/23/2023)  Physical Activity: Inactive (01/23/2023)  Social Connections: Moderately Isolated (10/22/2023)  Stress: No Stress Concern Present (01/23/2023)  Tobacco Use: Low Risk  (10/22/2023)  Health Literacy: Adequate Health Literacy (01/23/2023)     Readmission Risk Interventions     No data to display

## 2023-10-24 ENCOUNTER — Encounter (HOSPITAL_COMMUNITY): Payer: Self-pay | Admitting: Specialist

## 2023-10-24 DIAGNOSIS — E039 Hypothyroidism, unspecified: Secondary | ICD-10-CM | POA: Diagnosis not present

## 2023-10-24 DIAGNOSIS — Z79899 Other long term (current) drug therapy: Secondary | ICD-10-CM | POA: Diagnosis not present

## 2023-10-24 DIAGNOSIS — Z7982 Long term (current) use of aspirin: Secondary | ICD-10-CM | POA: Diagnosis not present

## 2023-10-24 DIAGNOSIS — M21161 Varus deformity, not elsewhere classified, right knee: Secondary | ICD-10-CM | POA: Diagnosis not present

## 2023-10-24 DIAGNOSIS — M1711 Unilateral primary osteoarthritis, right knee: Secondary | ICD-10-CM | POA: Diagnosis not present

## 2023-10-24 DIAGNOSIS — Z8673 Personal history of transient ischemic attack (TIA), and cerebral infarction without residual deficits: Secondary | ICD-10-CM | POA: Diagnosis not present

## 2023-10-24 DIAGNOSIS — Z96652 Presence of left artificial knee joint: Secondary | ICD-10-CM | POA: Diagnosis not present

## 2023-10-24 DIAGNOSIS — Z8616 Personal history of COVID-19: Secondary | ICD-10-CM | POA: Diagnosis not present

## 2023-10-24 LAB — CBC
HCT: 33.9 % — ABNORMAL LOW (ref 36.0–46.0)
Hemoglobin: 11 g/dL — ABNORMAL LOW (ref 12.0–15.0)
MCH: 32.7 pg (ref 26.0–34.0)
MCHC: 32.4 g/dL (ref 30.0–36.0)
MCV: 100.9 fL — ABNORMAL HIGH (ref 80.0–100.0)
Platelets: 169 10*3/uL (ref 150–400)
RBC: 3.36 MIL/uL — ABNORMAL LOW (ref 3.87–5.11)
RDW: 13.8 % (ref 11.5–15.5)
WBC: 10.3 10*3/uL (ref 4.0–10.5)
nRBC: 0 % (ref 0.0–0.2)

## 2023-10-24 MED ORDER — ASPIRIN 81 MG PO CHEW: 81.0000 mg | CHEWABLE_TABLET | Freq: Two times a day (BID) | ORAL | 1 refills | Status: AC

## 2023-10-24 MED ORDER — POLYETHYLENE GLYCOL 3350 17 G PO PACK: 17.0000 g | PACK | Freq: Every day | ORAL | 0 refills | Status: DC

## 2023-10-24 MED ORDER — HYDROMORPHONE HCL 2 MG PO TABS
2.0000 mg | ORAL_TABLET | ORAL | Status: DC | PRN
  Administered 2023-10-24 (×2): 2 mg via ORAL
  Filled 2023-10-24 (×2): qty 1

## 2023-10-24 MED ORDER — DOCUSATE SODIUM 100 MG PO CAPS: 100.0000 mg | ORAL_CAPSULE | Freq: Two times a day (BID) | ORAL | 2 refills | Status: DC

## 2023-10-24 MED ORDER — HYDROMORPHONE HCL 2 MG PO TABS: 2.0000 mg | ORAL_TABLET | ORAL | 0 refills | Status: DC | PRN

## 2023-10-24 NOTE — Plan of Care (Signed)
  Problem: Education: Goal: Knowledge of General Education information will improve Description: Including pain rating scale, medication(s)/side effects and non-pharmacologic comfort measures Outcome: Adequate for Discharge   Problem: Health Behavior/Discharge Planning: Goal: Ability to manage health-related needs will improve Outcome: Adequate for Discharge   Problem: Clinical Measurements: Goal: Ability to maintain clinical measurements within normal limits will improve Outcome: Adequate for Discharge Goal: Will remain free from infection Outcome: Adequate for Discharge Goal: Diagnostic test results will improve Outcome: Adequate for Discharge Goal: Respiratory complications will improve Outcome: Adequate for Discharge Goal: Cardiovascular complication will be avoided Outcome: Adequate for Discharge   Problem: Activity: Goal: Risk for activity intolerance will decrease 10/24/2023 1705 by Venice Gillis A, LPN Outcome: Adequate for Discharge 10/24/2023 0957 by Alexa Hymen, LPN Outcome: Progressing   Problem: Nutrition: Goal: Adequate nutrition will be maintained Outcome: Adequate for Discharge   Problem: Coping: Goal: Level of anxiety will decrease 10/24/2023 1705 by Alexa Hymen, LPN Outcome: Adequate for Discharge 10/24/2023 0957 by Alexa Hymen, LPN Outcome: Progressing   Problem: Elimination: Goal: Will not experience complications related to bowel motility Outcome: Adequate for Discharge Goal: Will not experience complications related to urinary retention Outcome: Adequate for Discharge   Problem: Pain Managment: Goal: General experience of comfort will improve and/or be controlled 10/24/2023 1705 by Venice Gillis A, LPN Outcome: Adequate for Discharge 10/24/2023 0957 by Alexa Hymen, LPN Outcome: Progressing   Problem: Safety: Goal: Ability to remain free from injury will improve Outcome: Adequate for Discharge   Problem: Skin  Integrity: Goal: Risk for impaired skin integrity will decrease Outcome: Adequate for Discharge   Problem: Education: Goal: Knowledge of the prescribed therapeutic regimen will improve Outcome: Adequate for Discharge Goal: Individualized Educational Video(s) Outcome: Adequate for Discharge   Problem: Activity: Goal: Ability to avoid complications of mobility impairment will improve Outcome: Adequate for Discharge Goal: Range of joint motion will improve Outcome: Adequate for Discharge   Problem: Clinical Measurements: Goal: Postoperative complications will be avoided or minimized Outcome: Adequate for Discharge   Problem: Pain Management: Goal: Pain level will decrease with appropriate interventions Outcome: Adequate for Discharge   Problem: Skin Integrity: Goal: Will show signs of wound healing Outcome: Adequate for Discharge

## 2023-10-24 NOTE — Plan of Care (Signed)
   Problem: Activity: Goal: Risk for activity intolerance will decrease Outcome: Progressing   Problem: Coping: Goal: Level of anxiety will decrease Outcome: Progressing   Problem: Pain Managment: Goal: General experience of comfort will improve and/or be controlled Outcome: Progressing

## 2023-10-24 NOTE — Progress Notes (Signed)
 Subjective: 2 Days Post-Op Procedure(s) (LRB): ARTHROPLASTY, KNEE, TOTAL (Right) Patient reports pain as mild.  She is groggy this AM, states she just woke up. Hopeful about going home today. Change in meds yesterday helped.  Objective: Vital signs in last 24 hours: Temp:  [98 F (36.7 C)-98.1 F (36.7 C)] 98.1 F (36.7 C) (05/22 1943) Pulse Rate:  [75-88] 81 (05/22 2228) Resp:  [15-16] 16 (05/22 1943) BP: (144-176)/(74-79) 176/77 (05/22 2228) SpO2:  [96 %-100 %] 98 % (05/22 1943)  Intake/Output from previous day: 05/22 0701 - 05/23 0700 In: 480 [P.O.:480] Out: 400 [Urine:400] Intake/Output this shift: No intake/output data recorded.  Recent Labs    10/23/23 0351 10/24/23 0053  HGB 10.9* 11.0*   Recent Labs    10/23/23 0351 10/24/23 0053  WBC 10.1 10.3  RBC 3.36* 3.36*  HCT 35.1* 33.9*  PLT 185 169   Recent Labs    10/23/23 0351  NA 131*  K 3.1*  CL 97*  CO2 25  BUN 10  CREATININE 0.86  GLUCOSE 94  CALCIUM  8.7*   No results for input(s): "LABPT", "INR" in the last 72 hours.  Neurologically intact ABD soft Neurovascular intact Sensation intact distally Intact pulses distally Dorsiflexion/Plantar flexion intact Incision: dressing C/D/I and no drainage No cellulitis present Compartment soft No sign of DVT   Assessment/Plan: 2 Days Post-Op Procedure(s) (LRB): ARTHROPLASTY, KNEE, TOTAL (Right) Advance diet Up with therapy D/C IV fluids Anticipate D/C later today May need to back off on dosage of dilaudid  and/or gabapentin if remains groggy so she is not overly sedated and able to get up and do her PT   Becky Boyd 10/24/2023, 8:25 AM

## 2023-10-24 NOTE — Progress Notes (Signed)
 Physical Therapy Treatment Patient Details Name: Becky Boyd MRN: [id number] DOB: 05-26-1956 Today's Date: 10/24/2023   History of Present Illness 68 yo female presents to therapy s/p R TKA on 10/22/2023 due to failure of conservative measures. Pt PMH includes but is not limited to: aneurysm, anorexia, dysphagia, fibromyalgia, GERD, hashimoto thyroiditis, kidney stones, hypothyroidism, lupus, RA, sjogren's dz, cervical spine fusion and rod placement, lumbar surgery, and L TKA (2022).    PT Comments  Pt assisted with ambulating in hallway and using bathroom prior to return to bed.  Pt states 6/10 right knee pain with activity. Pt on O2 [place] on entering room however removed for session. SpO2 94% on room air at rest.  SpO2 92% on room air upon returning to room after ambulating however 89-90% after using bathroom but quickly improved to 94% with a couple minutes rest/return to bed.  RN notified of SPO2 and okay with leaving nasal cannula off pt at this time.  Pt hopeful to d/c home later today.     If plan is discharge home, recommend the following: A little help with walking and/or transfers;A little help with bathing/dressing/bathroom;Assistance with cooking/housework;Assist for transportation;Help with stairs or ramp for entrance   Can travel by private vehicle        Equipment Recommendations  None recommended by PT    Recommendations for Other Services       Precautions / Restrictions Precautions Precautions: Fall;Knee Restrictions RLE Weight Bearing Per Provider Order: Weight bearing as tolerated     Mobility  Bed Mobility Overal bed mobility: Needs Assistance Bed Mobility: Supine to Sit, Sit to Supine     Supine to sit: Contact guard Sit to supine: Min assist   General bed mobility comments: assist for return bed with Rt LE due to pain    Transfers Overall transfer level: Needs assistance Equipment used: Rolling walker (2 wheels) Transfers: Sit to/from Stand Sit to  Stand: Contact guard assist           General transfer comment: cues for UE and LE positioning    Ambulation/Gait Ambulation/Gait assistance: Contact guard assist, Supervision Gait Distance (Feet): 140 Feet Assistive device: Rolling walker (2 wheels) Gait Pattern/deviations: Step-to pattern, Decreased stance time - right, Antalgic Gait velocity: decr     General Gait Details: verbal cues for sequence, RW positioning, step length, posture; distance per pt tolerance   Stairs             Wheelchair Mobility     Tilt Bed    Modified Rankin (Stroke Patients Only)       Balance                                            Communication Communication Communication: No apparent difficulties  Cognition Arousal: Alert Behavior During Therapy: WFL for tasks assessed/performed   PT - Cognitive impairments: No apparent impairments                         Following commands: Intact      Cueing Cueing Techniques: Verbal cues  Exercises      General Comments        Pertinent Vitals/Pain Pain Assessment Pain Assessment: 0-10 Pain Score: 6  Pain Descriptors / Indicators: Aching, Sore, Guarding, Grimacing Pain Intervention(s): Repositioned, Monitored during session, Premedicated before session    Home Living  Prior Function            PT Goals (current goals can now be found in the care plan section) Progress towards PT goals: Progressing toward goals    Frequency    7X/week      PT Plan      Co-evaluation              AM-PAC PT "6 Clicks" Mobility   Outcome Measure  Help needed turning from your back to your side while in a flat bed without using bedrails?: A Little Help needed moving from lying on your back to sitting on the side of a flat bed without using bedrails?: A Little Help needed moving to and from a bed to a chair (including a wheelchair)?: A Little Help needed  standing up from a chair using your arms (e.g., wheelchair or bedside chair)?: A Little Help needed to walk in hospital room?: A Little Help needed climbing 3-5 steps with a railing? : A Lot 6 Click Score: 17    End of Session Equipment Utilized During Treatment: Gait belt Activity Tolerance: Patient tolerated treatment well Patient left: in bed;with call bell/phone within reach;with bed alarm set Nurse Communication: Mobility status PT Visit Diagnosis: Difficulty in walking, not elsewhere classified (R26.2);Pain Pain - Right/Left: Right Pain - part of body: Knee     Time: 0941-1006 PT Time Calculation (min) (ACUTE ONLY): 25 min  Charges:    $Gait Training: 23-37 mins PT General Charges $$ ACUTE PT VISIT: 1 Visit                     Becky Boyd, DPT Physical Therapist Acute Rehabilitation Services Office: [phone]    Myna Asal Payson 10/24/2023, 1:27 PM

## 2023-10-24 NOTE — Progress Notes (Signed)
 Physical Therapy Treatment Patient Details Name: Becky Boyd MRN: [id number] DOB: 06-Jun-1955 Today's Date: 10/24/2023   History of Present Illness 68 yo female presents to therapy s/p R TKA on 10/22/2023 due to failure of conservative measures. Pt PMH includes but is not limited to: aneurysm, anorexia, dysphagia, fibromyalgia, GERD, hashimoto thyroiditis, kidney stones, hypothyroidism, lupus, RA, sjogren's dz, cervical spine fusion and rod placement, lumbar surgery, and L TKA (2022).    PT Comments  Pt ambulated in hallway and able to use bathroom and wash hands without physical assist.  Pt reports daughter will be available to assist her upon d/c.  Pt returned to bed per request and performed a couple exercises.  Pt feels able to d/c home today.  SpO2 90-92% on room air after mobilizing and RN aware.    If plan is discharge home, recommend the following: A little help with walking and/or transfers;A little help with bathing/dressing/bathroom;Assistance with cooking/housework;Assist for transportation;Help with stairs or ramp for entrance   Can travel by private vehicle        Equipment Recommendations  None recommended by PT    Recommendations for Other Services       Precautions / Restrictions Precautions Precautions: Fall;Knee Recall of Precautions/Restrictions: Intact Restrictions RLE Weight Bearing Per Provider Order: Weight bearing as tolerated     Mobility  Bed Mobility Overal bed mobility: Needs Assistance Bed Mobility: Supine to Sit, Sit to Supine     Supine to sit: Contact guard Sit to supine: Min assist   General bed mobility comments: assist for return bed with Rt LE due to pain    Transfers Overall transfer level: Needs assistance Equipment used: Rolling walker (2 wheels) Transfers: Sit to/from Stand Sit to Stand: Contact guard assist, Supervision           General transfer comment: cues for UE and LE positioning    Ambulation/Gait Ambulation/Gait  assistance: Supervision Gait Distance (Feet): 100 Feet Assistive device: Rolling walker (2 wheels) Gait Pattern/deviations: Step-to pattern, Decreased stance time - right, Antalgic Gait velocity: decr     General Gait Details: verbal cues for sequence, RW positioning, step length, posture; distance per pt tolerance   Stairs             Wheelchair Mobility     Tilt Bed    Modified Rankin (Stroke Patients Only)       Balance                                            Communication Communication Communication: No apparent difficulties  Cognition Arousal: Alert Behavior During Therapy: WFL for tasks assessed/performed   PT - Cognitive impairments: No apparent impairments                         Following commands: Intact      Cueing Cueing Techniques: Verbal cues  Exercises Total Joint Exercises Ankle Circles/Pumps: AROM, Both, 10 reps Quad Sets: AROM, Right, 10 reps Heel Slides: AAROM, 10 reps, Right    General Comments        Pertinent Vitals/Pain Pain Assessment Pain Assessment: 0-10 Pain Score: 5  Pain Location: R knee Pain Descriptors / Indicators: Aching, Sore, Guarding, Grimacing Pain Intervention(s): Repositioned, Monitored during session, Ice applied    Home Living  Prior Function            PT Goals (current goals can now be found in the care plan section) Progress towards PT goals: Progressing toward goals    Frequency    7X/week      PT Plan      Co-evaluation              AM-PAC PT "6 Clicks" Mobility   Outcome Measure  Help needed turning from your back to your side while in a flat bed without using bedrails?: A Little Help needed moving from lying on your back to sitting on the side of a flat bed without using bedrails?: A Little Help needed moving to and from a bed to a chair (including a wheelchair)?: A Little Help needed standing up from a chair  using your arms (e.g., wheelchair or bedside chair)?: A Little Help needed to walk in hospital room?: A Little Help needed climbing 3-5 steps with a railing? : A Little 6 Click Score: 18    End of Session Equipment Utilized During Treatment: Gait belt Activity Tolerance: Patient tolerated treatment well Patient left: in bed;with call bell/phone within reach;with bed alarm set Nurse Communication: Mobility status PT Visit Diagnosis: Difficulty in walking, not elsewhere classified (R26.2);Pain Pain - Right/Left: Right Pain - part of body: Knee     Time: 2956-2130 PT Time Calculation (min) (ACUTE ONLY): 23 min  Charges:    $Gait Training: 23-37 mins PT General Charges $$ ACUTE PT VISIT: 1 Visit                     Blanch Bunde, DPT Physical Therapist Acute Rehabilitation Services Office: [phone]    Myna Asal Payson 10/24/2023, 3:49 PM

## 2023-10-24 NOTE — Plan of Care (Signed)
   Problem: Clinical Measurements: Goal: Respiratory complications will improve Outcome: Progressing

## 2023-10-28 DIAGNOSIS — M25662 Stiffness of left knee, not elsewhere classified: Secondary | ICD-10-CM | POA: Insufficient documentation

## 2023-10-28 DIAGNOSIS — M25561 Pain in right knee: Secondary | ICD-10-CM | POA: Diagnosis not present

## 2023-10-30 NOTE — Discharge Summary (Signed)
 Physician Discharge Summary   Patient ID: Becky Boyd MRN: [id number] DOB/AGE: Jul 23, 1955 68 y.o.  Admit date: 10/22/2023 Discharge date: 10/24/2023  Primary Diagnosis: right knee end-stage primary osteoarthritis  Admission Diagnoses:  Past Medical History:  Diagnosis Date   Aneurysm (HCC)    Anorexia    COVID    Dysphagia    Fibromyalgia    Fibromyalgia    GERD (gastroesophageal reflux disease)    Hashimoto thyroiditis    History of kidney stones    hx of multiple kidney stones    Hypothyroidism    Kidney stones    Lupus    Palpitations    Rheumatoid arthritis (HCC)    Sjogren's disease (HCC)    TIA (transient ischemic attack)    once in 2020, no isses   Discharge Diagnoses:   Principal Problem:   Right knee DJD  Estimated body mass index is 26.34 kg/m as calculated from the following:   Height as of this encounter: 5' 0.25" (1.53 m).   Weight as of this encounter: 61.7 kg.  Procedure:  Procedure(s) (LRB): ARTHROPLASTY, KNEE, TOTAL (Right)   Consults: None  HPI: see pre-op H&P Laboratory Data: Admission on 10/22/2023, Discharged on 10/24/2023  Component Date Value Ref Range Status   WBC 10/23/2023 10.1  4.0 - 10.5 K/uL Final   RBC 10/23/2023 3.36 (L)  3.87 - 5.11 MIL/uL Final   Hemoglobin 10/23/2023 10.9 (L)  12.0 - 15.0 g/dL Final   HCT 78/46/9629 35.1 (L)  36.0 - 46.0 % Final   MCV 10/23/2023 104.5 (H)  80.0 - 100.0 fL Final   MCH 10/23/2023 32.4  26.0 - 34.0 pg Final   MCHC 10/23/2023 31.1  30.0 - 36.0 g/dL Final   RDW 52/84/1324 13.9  11.5 - 15.5 % Final   Platelets 10/23/2023 185  150 - 400 K/uL Final   nRBC 10/23/2023 0.0  0.0 - 0.2 % Final   Performed at [facility], [address] [place], [place] [address]   Sodium 10/23/2023 131 (L)  135 - 145 mmol/L Final   Potassium 10/23/2023 3.1 (L)  3.5 - 5.1 mmol/L Final   Chloride 10/23/2023 97 (L)  98 - 111 mmol/L Final   CO2 10/23/2023 25  22 - 32 mmol/L Final   Glucose, Bld  10/23/2023 94  70 - 99 mg/dL Final   Glucose reference range applies only to samples taken after fasting for at least 8 hours.   BUN 10/23/2023 10  8 - 23 mg/dL Final   Creatinine, Ser 10/23/2023 0.86  0.44 - 1.00 mg/dL Final   Calcium  10/23/2023 8.7 (L)  8.9 - 10.3 mg/dL Final   GFR, Estimated 10/23/2023 >60  >60 mL/min Final   Comment: (NOTE) Calculated using the CKD-EPI Creatinine Equation (2021)    Anion gap 10/23/2023 9  5 - 15 Final   Performed at [facility], [address] [place], [place] [address]   WBC 10/24/2023 10.3  4.0 - 10.5 K/uL Final   RBC 10/24/2023 3.36 (L)  3.87 - 5.11 MIL/uL Final   Hemoglobin 10/24/2023 11.0 (L)  12.0 - 15.0 g/dL Final   HCT 64/40/3474 33.9 (L)  36.0 - 46.0 % Final   MCV 10/24/2023 100.9 (H)  80.0 - 100.0 fL Final   MCH 10/24/2023 32.7  26.0 - 34.0 pg Final   MCHC 10/24/2023 32.4  30.0 - 36.0 g/dL Final   RDW 25/95/6387 13.8  11.5 - 15.5 % Final   Platelets 10/24/2023 169  150 - 400 K/uL Final   nRBC 10/24/2023 0.0  0.0 - 0.2 % Final   Performed at [facility], [address] [place], [place] [address]  Hospital Outpatient Visit on 10/17/2023  Component Date Value Ref Range Status   WBC 10/17/2023 7.0  4.0 - 10.5 K/uL Final   RBC 10/17/2023 3.64 (L)  3.87 - 5.11 MIL/uL Final   Hemoglobin 10/17/2023 11.6 (L)  12.0 - 15.0 g/dL Final   HCT 65/78/4696 37.6  36.0 - 46.0 % Final   MCV 10/17/2023 103.3 (H)  80.0 - 100.0 fL Final   MCH 10/17/2023 31.9  26.0 - 34.0 pg Final   MCHC 10/17/2023 30.9  30.0 - 36.0 g/dL Final   RDW 29/52/8413 14.3  11.5 - 15.5 % Final   Platelets 10/17/2023 210  150 - 400 K/uL Final   nRBC 10/17/2023 0.0  0.0 - 0.2 % Final   Performed at [facility], [address] [place], [place] [address]   Sodium 10/17/2023 139  135 - 145 mmol/L Final   Potassium 10/17/2023 3.9  3.5 - 5.1 mmol/L Final   Chloride 10/17/2023 106  98 - 111 mmol/L Final   CO2 10/17/2023 25   22 - 32 mmol/L Final   Glucose, Bld 10/17/2023 120 (H)  70 - 99 mg/dL Final   Glucose reference range applies only to samples taken after fasting for at least 8 hours.   BUN 10/17/2023 12  8 - 23 mg/dL Final   Creatinine, Ser 10/17/2023 1.01 (H)  0.44 - 1.00 mg/dL Final   Calcium  10/17/2023 9.0  8.9 - 10.3 mg/dL Final   GFR, Estimated 10/17/2023 >60  >60 mL/min Final   Comment: (NOTE) Calculated using the CKD-EPI Creatinine Equation (2021)    Anion gap 10/17/2023 8  5 - 15 Final   Performed at [facility], [address], [place], [place] [address]   MRSA, PCR 10/17/2023 NEGATIVE  NEGATIVE Final   Staphylococcus aureus 10/17/2023 NEGATIVE  NEGATIVE Final   Comment: (NOTE) The Xpert SA Assay (FDA approved for NASAL specimens in patients 62 years of age and older), is one component of a comprehensive surveillance program. It is not intended to diagnose infection nor to guide or monitor treatment. Performed at [facility], [address] [place], [place] [address]      X-Rays:DG Knee 1-2 Views Right Result Date: 10/22/2023 CLINICAL DATA:  Status post total right knee arthroplasty. EXAM: RIGHT KNEE - 1-2 VIEW COMPARISON:  None Available. FINDINGS: Interval total right knee arthroplasty. No perihardware lucency is seen to indicate hardware failure or loosening. Expected postoperative changes including intra-articular and subcutaneous air. Smalljoint effusion. No acute fracture or dislocation. IMPRESSION: Interval total right knee arthroplasty without evidence of hardware failure. Electronically Signed   By: Bertina Broccoli M.D.   On: 10/22/2023 15:34    EKG: Orders placed or performed in visit on 07/02/23   EKG 12-Lead     Hospital Course: Becky Boyd is a 69 y.o. who was admitted to [facility]. They were brought to the operating room on 10/22/2023 and underwent Procedure(s): ARTHROPLASTY, KNEE, TOTAL.  Patient tolerated the procedure well  and was later transferred to the recovery room and then to the orthopaedic floor for postoperative care.  They were given PO and IV analgesics for pain control following their surgery.  They were given 24 hours of postoperative antibiotics of  Anti-infectives (From admission, onward)    Start  Dose/Rate Route Frequency Ordered Stop   10/22/23 1445  ceFAZolin  (ANCEF ) IVPB 2g/100 mL premix        2 g 200 mL/hr over 30 Minutes Intravenous Every 6 hours 10/22/23 1119 10/22/23 2209   10/22/23 0645  ceFAZolin  (ANCEF ) IVPB 2g/100 mL premix        2 g 200 mL/hr over 30 Minutes Intravenous On call to O.R. 10/22/23 2956 10/22/23 0843      and started on DVT prophylaxis in the form of Aspirin , TED hose, and SCDs.   PT and OT were ordered for total joint protocol.  Discharge planning consulted to help with postop disposition and equipment needs.  Patient had a difficult night on the evening of surgery.  They started to get up OOB with therapy on day one. Continued to work with therapy into day two.  By day two, the patient had progressed with therapy and meeting their goals.  Incision was healing well.  Patient was seen in rounds and was ready to go home.   Diet: Regular diet Activity:WBAT Follow-up:in 10-14 days Disposition - Home Discharged Condition: good   Discharge Instructions     Call MD / Call 911   Complete by: As directed    If you experience chest pain or shortness of breath, CALL 911 and be transported to the hospital emergency room.  If you develope a fever above 101 F, pus (white drainage) or increased drainage or redness at the wound, or calf pain, call your surgeon's office.   Constipation Prevention   Complete by: As directed    Drink plenty of fluids.  Prune juice may be helpful.  You may use a stool softener, such as Colace (over the counter) 100 mg twice a day.  Use MiraLax  (over the counter) for constipation as needed.   Diet - low sodium heart healthy   Complete by: As  directed    Increase activity slowly as tolerated   Complete by: As directed    Post-operative opioid taper instructions:   Complete by: As directed    POST-OPERATIVE OPIOID TAPER INSTRUCTIONS: It is important to wean off of your opioid medication as soon as possible. If you do not need pain medication after your surgery it is ok to stop day one. Opioids include: Codeine , Hydrocodone (Norco, Vicodin), Oxycodone (Percocet, oxycontin ) and hydromorphone  amongst others.  Long term and even short term use of opiods can cause: Increased pain response Dependence Constipation Depression Respiratory depression And more.  Withdrawal symptoms can include Flu like symptoms Nausea, vomiting And more Techniques to manage these symptoms Hydrate well Eat regular healthy meals Stay active Use relaxation techniques(deep breathing, meditating, yoga) Do Not substitute Alcohol  to help with tapering If you have been on opioids for less than two weeks and do not have pain than it is ok to stop all together.  Plan to wean off of opioids This plan should start within one week post op of your joint replacement. Maintain the same interval or time between taking each dose and first decrease the dose.  Cut the total daily intake of opioids by one tablet each day Next start to increase the time between doses. The last dose that should be eliminated is the evening dose.         Allergies as of 10/24/2023       Reactions   Plaquenil  [hydroxychloroquine  Sulfate] Other (See Comments)   Prolonged QT interval, skin feels like bee stings        Medication List  STOP taking these medications    HYDROcodone -acetaminophen  7.5-325 MG tablet Commonly known as: NORCO   meloxicam 15 MG tablet Commonly known as: MOBIC       TAKE these medications    aspirin  81 MG chewable tablet Chew 1 tablet (81 mg total) by mouth 2 (two) times daily. What changed: when to take this   atorvastatin  20 MG  tablet Commonly known as: LIPITOR TAKE ONE TABLET BY MOUTH ONCE A DAY   baclofen  10 MG tablet Commonly known as: LIORESAL  TAKE ONE TABLET BY MOUTH THREE TIMES DAILY What changed:  when to take this reasons to take this   docusate sodium  100 MG capsule Commonly known as: Colace Take 1 capsule (100 mg total) by mouth 2 (two) times daily.   DULoxetine  60 MG capsule Commonly known as: CYMBALTA  Take 60 mg by mouth at bedtime.   HYDROmorphone  2 MG tablet Commonly known as: DILAUDID  Take 1 tablet (2 mg total) by mouth every 4 (four) hours as needed for severe pain (pain score 7-10) (1 - 2 TABLETS Q 4H PRN PAIN).   levothyroxine  75 MCG tablet Commonly known as: SYNTHROID  TAKE ONE TABLET BY MOUTH ONCE A DAY   LUBRICATING EYE DROPS OP Place 1 drop into both eyes daily as needed (dry eyes).   MAGNESIUM  PO Take 1 tablet by mouth daily.   pantoprazole  40 MG tablet Commonly known as: PROTONIX  TAKE ONE TABLET BY MOUTH ONCE A DAY   polyethylene glycol 17 g packet Commonly known as: MIRALAX  / GLYCOLAX  Take 17 g by mouth daily.   predniSONE  5 MG tablet Commonly known as: DELTASONE  Take 5 mg by mouth daily.   sulfaSALAzine  500 MG tablet Commonly known as: AZULFIDINE  Take 1,000 mg by mouth 2 (two) times daily.        Follow-up Information     Orvan Blanch, MD. Schedule an appointment as soon as possible for a visit in 2 week(s).   Specialty: Orthopedic Surgery Contact information: [address] [place] [address] [place] [address] [phone]                 Signed: Merriam Abbey PA-C Orthopaedic Surgery 10/30/2023, 10:22 AM

## 2023-10-31 DIAGNOSIS — M25561 Pain in right knee: Secondary | ICD-10-CM | POA: Diagnosis not present

## 2023-11-03 ENCOUNTER — Encounter (HOSPITAL_COMMUNITY): Payer: Self-pay | Admitting: Specialist

## 2023-11-03 DIAGNOSIS — M25662 Stiffness of left knee, not elsewhere classified: Secondary | ICD-10-CM | POA: Diagnosis not present

## 2023-11-03 NOTE — Anesthesia Postprocedure Evaluation (Signed)
 Anesthesia Post Note  Patient: Becky Boyd  Procedure(s) Performed: ARTHROPLASTY, KNEE, TOTAL (Right: Knee)     Patient location during evaluation: PACU Anesthesia Type: Regional and General Level of consciousness: awake and alert Pain management: pain level controlled Vital Signs Assessment: post-procedure vital signs reviewed and stable Respiratory status: spontaneous breathing, nonlabored ventilation, respiratory function stable and patient connected to nasal cannula oxygen  Cardiovascular status: blood pressure returned to baseline and stable Postop Assessment: no apparent nausea or vomiting Anesthetic complications: no   No notable events documented.  Last Vitals:  Vitals:   10/23/23 2228 10/24/23 1441  BP: (!) 176/77 (!) 145/77  Pulse: 81 85  Resp:  17  Temp:  37.1 C  SpO2:  93%    Last Pain:  Vitals:   10/24/23 1408  TempSrc:   PainSc: 6                  Pascual Mantel S

## 2023-11-05 DIAGNOSIS — M79604 Pain in right leg: Secondary | ICD-10-CM | POA: Diagnosis not present

## 2023-11-07 ENCOUNTER — Ambulatory Visit (HOSPITAL_COMMUNITY)
Admission: RE | Admit: 2023-11-07 | Discharge: 2023-11-07 | Disposition: A | Discharge status: Home / Self Care | Source: Ambulatory Visit | Attending: Vascular Surgery | Admitting: Vascular Surgery

## 2023-11-07 ENCOUNTER — Other Ambulatory Visit (HOSPITAL_COMMUNITY): Payer: Self-pay | Admitting: Specialist

## 2023-11-07 DIAGNOSIS — M4322 Fusion of spine, cervical region: Secondary | ICD-10-CM | POA: Diagnosis not present

## 2023-11-07 DIAGNOSIS — M25662 Stiffness of left knee, not elsewhere classified: Secondary | ICD-10-CM | POA: Diagnosis not present

## 2023-11-07 DIAGNOSIS — M79604 Pain in right leg: Secondary | ICD-10-CM

## 2023-11-07 DIAGNOSIS — Z79899 Other long term (current) drug therapy: Secondary | ICD-10-CM | POA: Diagnosis not present

## 2023-11-07 DIAGNOSIS — Z96652 Presence of left artificial knee joint: Secondary | ICD-10-CM | POA: Diagnosis not present

## 2023-11-07 DIAGNOSIS — M3500 Sicca syndrome, unspecified: Secondary | ICD-10-CM | POA: Diagnosis not present

## 2023-11-07 DIAGNOSIS — N1831 Chronic kidney disease, stage 3a: Secondary | ICD-10-CM | POA: Diagnosis not present

## 2023-11-07 DIAGNOSIS — M961 Postlaminectomy syndrome, not elsewhere classified: Secondary | ICD-10-CM | POA: Diagnosis not present

## 2023-11-07 DIAGNOSIS — M0579 Rheumatoid arthritis with rheumatoid factor of multiple sites without organ or systems involvement: Secondary | ICD-10-CM | POA: Diagnosis not present

## 2023-11-07 DIAGNOSIS — Z96651 Presence of right artificial knee joint: Secondary | ICD-10-CM | POA: Diagnosis not present

## 2023-11-07 DIAGNOSIS — Z6824 Body mass index (BMI) 24.0-24.9, adult: Secondary | ICD-10-CM | POA: Diagnosis not present

## 2023-11-11 DIAGNOSIS — M25561 Pain in right knee: Secondary | ICD-10-CM | POA: Diagnosis not present

## 2023-11-11 DIAGNOSIS — M25662 Stiffness of left knee, not elsewhere classified: Secondary | ICD-10-CM | POA: Diagnosis not present

## 2023-11-16 ENCOUNTER — Other Ambulatory Visit: Payer: Self-pay | Admitting: Family Medicine

## 2023-11-16 DIAGNOSIS — E039 Hypothyroidism, unspecified: Secondary | ICD-10-CM

## 2023-12-04 DIAGNOSIS — Z79899 Other long term (current) drug therapy: Secondary | ICD-10-CM | POA: Diagnosis not present

## 2023-12-04 DIAGNOSIS — M961 Postlaminectomy syndrome, not elsewhere classified: Secondary | ICD-10-CM | POA: Diagnosis not present

## 2023-12-04 DIAGNOSIS — Z6824 Body mass index (BMI) 24.0-24.9, adult: Secondary | ICD-10-CM | POA: Diagnosis not present

## 2023-12-04 DIAGNOSIS — N1831 Chronic kidney disease, stage 3a: Secondary | ICD-10-CM | POA: Diagnosis not present

## 2023-12-04 DIAGNOSIS — Z96652 Presence of left artificial knee joint: Secondary | ICD-10-CM | POA: Diagnosis not present

## 2023-12-04 DIAGNOSIS — Z96651 Presence of right artificial knee joint: Secondary | ICD-10-CM | POA: Diagnosis not present

## 2023-12-04 DIAGNOSIS — R42 Dizziness and giddiness: Secondary | ICD-10-CM | POA: Diagnosis not present

## 2023-12-04 DIAGNOSIS — M3500 Sicca syndrome, unspecified: Secondary | ICD-10-CM | POA: Diagnosis not present

## 2023-12-04 DIAGNOSIS — M4322 Fusion of spine, cervical region: Secondary | ICD-10-CM | POA: Diagnosis not present

## 2023-12-04 DIAGNOSIS — M0579 Rheumatoid arthritis with rheumatoid factor of multiple sites without organ or systems involvement: Secondary | ICD-10-CM | POA: Diagnosis not present

## 2023-12-10 ENCOUNTER — Ambulatory Visit
Admission: EM | Admit: 2023-12-10 | Discharge: 2023-12-10 | Disposition: A | Discharge status: Home / Self Care | Attending: Family Medicine | Admitting: Family Medicine

## 2023-12-10 ENCOUNTER — Other Ambulatory Visit: Payer: Self-pay

## 2023-12-10 DIAGNOSIS — J208 Acute bronchitis due to other specified organisms: Secondary | ICD-10-CM

## 2023-12-10 MED ORDER — BENZONATATE 200 MG PO CAPS: 200.0000 mg | ORAL_CAPSULE | Freq: Three times a day (TID) | ORAL | 0 refills | Status: DC | PRN

## 2023-12-10 MED ORDER — PREDNISONE 20 MG PO TABS: 40.0000 mg | ORAL_TABLET | Freq: Every day | ORAL | 0 refills | Status: DC

## 2023-12-10 MED ORDER — AZITHROMYCIN 250 MG PO TABS: ORAL_TABLET | ORAL | 0 refills | Status: DC

## 2023-12-10 NOTE — Discharge Instructions (Signed)
 Increase your prednisone  to 40 mg a day for 5 days.  Then may go back down to 5 mg a day Take Tessalon  2-3 times a day to help with the coughing May also take an over-the-counter DM product like Delsym  or Mucinex  DM Make sure you are drinking lots of fluids If you fail to see improvement by 7 to 10 days fill and take the Z-Pak See your doctor in follow-up

## 2023-12-10 NOTE — ED Provider Notes (Signed)
 [name]    CSN: [id number] Arrival date & time: 12/10/23  1902      History   Chief Complaint Chief Complaint  Patient presents with   Cough    HPI Becky Boyd is a 68 y.o. female.   Pleasant 68 year old.  Non-smoker.  No underlying lung disease.  States that she has had a cough for 3 days.  From all the coughing she now has a scratchy throat and hoarse voice.  She does not have any chest pain.  No fever or chills.  No sputum production.  No fatigue.  No known exposure to illness.  No recent travel.    Past Medical History:  Diagnosis Date   Aneurysm (HCC)    Anorexia    COVID    Dysphagia    Fibromyalgia    Fibromyalgia    GERD (gastroesophageal reflux disease)    Hashimoto thyroiditis    History of kidney stones    hx of multiple kidney stones    Hypothyroidism    Kidney stones    Lupus    Palpitations    Rheumatoid arthritis (HCC)    Sjogren's disease (HCC)    TIA (transient ischemic attack)    once in 2020, no isses    Patient Active Problem List   Diagnosis Date Noted   Right knee DJD 10/22/2023   Degeneration of lumbar intervertebral disc 05/21/2021   S/P TKR (total knee replacement) using cement 06/09/2020   Tear of medial meniscus of knee 02/24/2020   Rheumatoid arthritis (HCC) 09/23/2018   Hypokalemia 09/19/2018   COVID-19 virus infection 09/17/2018   Hyperlipidemia 01/27/2017   Bell's palsy 02/13/2016   Hematuria 03/02/2015   Left lumbar radiculopathy 11/09/2014   Shortness of breath 11/09/2014   Jaw pain 04/13/2014   Pain in joint, shoulder region 10/21/2013   White matter abnormality on MRI of brain 06/17/2013   Sjogren's syndrome (HCC) 06/17/2013   Blurry vision 06/17/2013   Sicca syndrome (HCC) 06/17/2013   Back pain, lumbosacral 09/30/2012   Trigger point with back pain 03/22/2011   General medical examination 01/25/2011   Memory loss 01/25/2011   Joint pain 11/08/2010   Fatigue 11/08/2010   Hypothyroidism 05/02/2008    HASHIMOTO'S THYROIDITIS 05/02/2008   Dysphagia 05/02/2008    Past Surgical History:  Procedure Laterality Date   ABDOMINAL HYSTERECTOMY     CERVICAL FUSION  2004   C4-C5 with Instrumentation   CERVICAL FUSION  1990   C3-C4   cervical rods     CHOLECYSTECTOMY     kidney stone     multiple   l5-s1 surgery     LUMBAR MICRODISCECTOMY  2004   Bilateral L5-S1   TOTAL KNEE ARTHROPLASTY Left 06/09/2020   Procedure: TOTAL KNEE ARTHROPLASTY;  Surgeon: Duwayne Purchase, MD;  Location: [facility];  Service: Orthopedics;  Laterality: Left;   TOTAL KNEE ARTHROPLASTY Right 10/22/2023   Procedure: ARTHROPLASTY, KNEE, TOTAL;  Surgeon: Duwayne Purchase, MD;  Location: [facility];  Service: Orthopedics;  Laterality: Right;  Right total knee arthroplasty, left knee exam under anesthesia, manipulation under anesthesia    OB History   No obstetric history on file.      Home Medications    Prior to Admission medications   Medication Sig Start Date End Date Taking? Authorizing Provider  azithromycin  (ZITHROMAX  Z-PAK) 250 MG tablet Take two pills today followed by one a day until gone 12/10/23  Yes Maranda Jamee Jacob, MD  benzonatate  (TESSALON ) 200 MG capsule Take 1  capsule (200 mg total) by mouth 3 (three) times daily as needed for cough. 12/10/23  Yes Maranda Jamee Jacob, MD  predniSONE  (DELTASONE ) 20 MG tablet Take 2 tablets (40 mg total) by mouth daily with breakfast. 12/10/23  Yes Maranda Jamee Jacob, MD  aspirin  81 MG chewable tablet Chew 1 tablet (81 mg total) by mouth 2 (two) times daily. 10/24/23   Bissell, Jaclyn M, PA-C  atorvastatin  (LIPITOR) 20 MG tablet TAKE ONE TABLET BY MOUTH ONCE A DAY 11/17/23   Tabori, Katherine E, MD  baclofen  (LIORESAL ) 10 MG tablet TAKE ONE TABLET BY MOUTH THREE TIMES DAILY Patient taking differently: Take 10 mg by mouth 3 (three) times daily as needed for muscle spasms. 06/02/23   Tabori, Katherine E, MD  Carboxymethylcellul-Glycerin  (LUBRICATING EYE DROPS OP) Place 1 drop into  both eyes daily as needed (dry eyes).    [provider]  docusate sodium  (COLACE) 100 MG capsule Take 1 capsule (100 mg total) by mouth 2 (two) times daily. 10/24/23 10/23/24  Bissell, Jaclyn M, PA-C  DULoxetine  (CYMBALTA ) 60 MG capsule Take 60 mg by mouth at bedtime. 11/01/14   [provider]  levothyroxine  (SYNTHROID ) 75 MCG tablet TAKE ONE TABLET BY MOUTH ONCE A DAY 11/17/23   Tabori, Katherine E, MD  MAGNESIUM  PO Take 1 tablet by mouth daily.    [provider]  pantoprazole  (PROTONIX ) 40 MG tablet TAKE ONE TABLET BY MOUTH ONCE A DAY 10/21/23   Tabori, Katherine E, MD  polyethylene glycol (MIRALAX  / GLYCOLAX ) 17 g packet Take 17 g by mouth daily. 10/24/23   Bissell, Jaclyn M, PA-C  predniSONE  (DELTASONE ) 5 MG tablet Take 5 mg by mouth daily.    [provider]  sulfaSALAzine  (AZULFIDINE ) 500 MG tablet Take 1,000 mg by mouth 2 (two) times daily. 01/06/17   [provider]    Family History Family History  Problem Relation Age of Onset   COPD Mother    Heart disease Father    Diabetes Sister    COPD Brother    Breast cancer Paternal Aunt    Aneurysm Paternal Aunt    Diabetes Other    Dementia Neg Hx    Alzheimer's disease Neg Hx     Social History Social History   Tobacco Use   Smoking status: Never   Smokeless tobacco: Never  Vaping Use   Vaping status: Never Used  Substance Use Topics   Alcohol  use: Never   Drug use: Never     Allergies   Plaquenil  [hydroxychloroquine  sulfate]   Review of Systems Review of Systems  See HPI Physical Exam Triage Vital Signs ED Triage Vitals  Encounter Vitals Group     BP 12/10/23 1908 (!) 146/91     Girls Systolic BP Percentile --      Girls Diastolic BP Percentile --      Boys Systolic BP Percentile --      Boys Diastolic BP Percentile --      Pulse Rate 12/10/23 1908 95     Resp 12/10/23 1908 16     Temp 12/10/23 1908 98.3 F (36.8 C)     Temp src --      SpO2 12/10/23 1908 96 %      Weight --      Height --      Head Circumference --      Peak Flow --      Pain Score 12/10/23 1913 4     Pain Loc --  Pain Education --      Exclude from Growth Chart --    No data found.  Updated Vital Signs BP (!) 146/91   Pulse 95   Temp 98.3 F (36.8 C)   Resp 16   SpO2 96%       Physical Exam Vitals reviewed.  Constitutional:      General: She is not in acute distress.    Appearance: She is well-developed.     Comments: Hoarse voice  HENT:     Head: Normocephalic and atraumatic.     Right Ear: Tympanic membrane normal.     Left Ear: Tympanic membrane normal.     Nose: Nose normal.     Mouth/Throat:     Mouth: Mucous membranes are moist.     Pharynx: No posterior oropharyngeal erythema.  Eyes:     Conjunctiva/sclera: Conjunctivae normal.     Pupils: Pupils are equal, round, and reactive to light.  Cardiovascular:     Rate and Rhythm: Normal rate and regular rhythm.     Heart sounds: Normal heart sounds.  Pulmonary:     Effort: Pulmonary effort is normal. No respiratory distress.     Breath sounds: Normal breath sounds. No wheezing or rhonchi.  Abdominal:     General: There is no distension.     Palpations: Abdomen is soft.  Musculoskeletal:        General: Swelling present. Normal range of motion.     Cervical back: Normal range of motion.     Comments: Right knee has recent surgery.  Wound is healing well.  Warmth and swelling still present  Lymphadenopathy:     Cervical: No cervical adenopathy.  Skin:    General: Skin is warm and dry.  Neurological:     Mental Status: She is alert.      UC Treatments / Results  Labs (all labs ordered are listed, but only abnormal results are displayed) Labs Reviewed - No data to display  EKG   Radiology No results found.  Procedures Procedures (including critical care time)  Medications Ordered in UC Medications - No data to display  Initial Impression / Assessment and Plan / UC Course  I  have reviewed the triage vital signs and the nursing notes.  Pertinent labs & imaging results that were available during my care of the patient were reviewed by me and considered in my medical decision making (see chart for details).     Discussed that the overwhelming majority of bronchitis with laryngitis is caused by a virus.  Discussed home management.  Patient is given a prescription for a Z-Pak to take if she fails to improve.  She does feel like she is immunocompromise given her chronic steroid use and rheumatoid arthritis Final Clinical Impressions(s) / UC Diagnoses   Final diagnoses:  Acute viral bronchitis     Discharge Instructions      Increase your prednisone  to 40 mg a day for 5 days.  Then may go back down to 5 mg a day Take Tessalon  2-3 times a day to help with the coughing May also take an over-the-counter DM product like Delsym  or Mucinex  DM Make sure you are drinking lots of fluids If you fail to see improvement by 7 to 10 days fill and take the Z-Pak See your doctor in follow-up   ED Prescriptions     Medication Sig Dispense Auth. Provider   predniSONE  (DELTASONE ) 20 MG tablet Take 2 tablets (40 mg total) by mouth  daily with breakfast. 10 tablet Maranda Jamee Jacob, MD   benzonatate  (TESSALON ) 200 MG capsule Take 1 capsule (200 mg total) by mouth 3 (three) times daily as needed for cough. 21 capsule Maranda Jamee Jacob, MD   azithromycin  (ZITHROMAX  Z-PAK) 250 MG tablet Take two pills today followed by one a day until gone 6 tablet Maranda Jamee Jacob, MD      I have reviewed the PDMP during this encounter.   Maranda Jamee Jacob, MD 12/10/23 GENNIE

## 2023-12-10 NOTE — ED Triage Notes (Signed)
 Sick since Sunday, has had cough, loss of voice, ears itching, sore throat. Thinks sore throat is from coughing so much. No fever. Took robitussin.

## 2023-12-22 ENCOUNTER — Encounter: Payer: Self-pay | Admitting: Student in an Organized Health Care Education/Training Program

## 2023-12-22 ENCOUNTER — Ambulatory Visit (INDEPENDENT_AMBULATORY_CARE_PROVIDER_SITE_OTHER): Admitting: Student in an Organized Health Care Education/Training Program

## 2023-12-22 VITALS — BP 132/84 | HR 96 | Ht 60.25 in | Wt 134.2 lb

## 2023-12-22 DIAGNOSIS — J189 Pneumonia, unspecified organism: Secondary | ICD-10-CM | POA: Insufficient documentation

## 2023-12-22 MED ORDER — AMOXICILLIN-POT CLAVULANATE 875-125 MG PO TABS: 1.0000 | ORAL_TABLET | Freq: Two times a day (BID) | ORAL | 0 refills | Status: DC

## 2023-12-22 NOTE — Patient Instructions (Signed)
  VISIT SUMMARY: Today, you were seen for a persistent cough and hoarse voice that have been ongoing for three weeks. You were previously diagnosed with bronchitis and treated with prednisone  and Tessalon , but your symptoms have not improved. You also have a history of rheumatoid arthritis and Sjogren's syndrome.  YOUR PLAN: -BRONCHITIS WITH POSSIBLE PNEUMONIA: Bronchitis is an inflammation of the bronchial tubes, and pneumonia is an infection that inflames the air sacs in one or both lungs. Given the persistence and severity of your symptoms, we suspect you might have pneumonia. You will start taking Augmentin  twice daily to help fight the infection. Be aware of potential side effects like diarrhea and upset stomach. If you experience any severe side effects, please contact us  immediately.  -RHEUMATOID ARTHRITIS AND SJOGREN'S SYNDROME: Rheumatoid arthritis is an autoimmune disorder that causes joint inflammation, and Sjogren's syndrome is another autoimmune disorder that affects moisture-producing glands. Your condition is currently managed with prednisone  and sulfasalazine , and you are under the care of a rheumatologist and pain management specialist.  INSTRUCTIONS: Please start taking Augmentin  as prescribed. If your symptoms do not improve or if they worsen, contact our office. Continue your current medications for rheumatoid arthritis and Sjogren's syndrome as directed by your specialists. Follow up with us  if you have any concerns or new symptoms.

## 2023-12-22 NOTE — Progress Notes (Signed)
 Acute Office Visit  Subjective:     Patient ID: Becky Boyd, female    DOB: [birth year], 68 y.o.   MRN: [id number]  Chief Complaint  Patient presents with   Cough     Sx started a couple weeks ago. Hoarse. Went to an urgent care 2 weeks ago. Dx with bronchitis. Prednisone  dose pack, abx and tessalon  pearls.     HPI  Discussed the use of AI scribe software for clinical note transcription with the patient, who gave verbal consent to proceed.  History of Present Illness Becky Boyd is a 68 year old female with rheumatoid arthritis and Sjogren's syndrome who presents with a persistent cough and hoarse voice.  She has been experiencing a persistent cough and hoarse voice for the past three weeks. Two weeks ago, she visited an after-hours clinic and was diagnosed with bronchitis. She completed a course of prednisone  and was prescribed Tessalon  for the cough, which was ineffective. She was also given a prescription for an antibiotic to fill if her symptoms did not improve, but she misplaced it and did not fill it.  Her symptoms include a sore throat localized at the base, which does not affect her ability to swallow. She occasionally produces sputum with her cough. No history of asthma, lung problems, COPD, or tobacco use. She reports a little sinus drainage but no sinus pain, fever, chills, or ear pain. Her voice became hoarse approximately two weeks ago and has not improved. The cough occurs both day and night, sometimes waking her from sleep.  She has a history of rheumatoid arthritis and Sjogren's syndrome, for which she takes 5 mg of prednisone  daily and sulfasalazine . She also uses hydrocodone  for pain management as part of her treatment regimen. No allergies to antibiotics. She recalls using a cough medicine with codeine  in the past that was effective, but notes that her current hydrocodone  use does not alleviate her cough.      Objective:    BP 132/84 (BP Location: Right Arm, Patient  Position: Sitting, Cuff Size: Normal)   Pulse 96   Ht 5' 0.25 (1.53 m)   Wt 134 lb 3.2 oz (60.9 kg)   SpO2 96%   BMI 25.99 kg/m   Physical Exam  Gen: Uncomfortable appearing woman, frequent coughing Ears: Bilateral tympanic membranes are clear with no effusions Mouth: Unable to see the posterior oropharynx due to a high tongue Neck: No lymphadenopathy or nodules Heart: Regular, no murmur Lungs: Unlabored, frequent coughing, mild coarse crackles heard at the left base      Assessment & Plan:    Problem List Items Addressed This Visit       Unprioritized   Community acquired pneumonia - Primary   She has had a persistent cough and hoarse voice for three weeks, initially treated without relief.  She is immunosuppressed on sulfasalazine  and chronic prednisone .  On exam, crackles suggest possible pneumonia.  Given her immunosuppression, I think antibiotics are reasonable. After discussing the risks, she agreed to antibiotics because of the impact on her quality of life. Prescribed Augmentin  twice daily. Educate about potential side effects, including diarrhea and upset stomach.  I did not add a codeine -based cough syrup because she is already using hydrocodone  and I do not want to increase her risk of falls and side effects.      Relevant Medications   amoxicillin -clavulanate (AUGMENTIN ) 875-125 MG tablet    Meds ordered this encounter  Medications   amoxicillin -clavulanate (AUGMENTIN ) 875-125 MG tablet  Sig: Take 1 tablet by mouth 2 (two) times daily for 7 days.    Dispense:  14 tablet    Refill:  0    Return if symptoms worsen or fail to improve.  Cleatus Debby Specking, MD

## 2023-12-22 NOTE — Assessment & Plan Note (Signed)
 She has had a persistent cough and hoarse voice for three weeks, initially treated without relief.  She is immunosuppressed on sulfasalazine  and chronic prednisone .  On exam, crackles suggest possible pneumonia.  Given her immunosuppression, I think antibiotics are reasonable. After discussing the risks, she agreed to antibiotics because of the impact on her quality of life. Prescribed Augmentin  twice daily. Educate about potential side effects, including diarrhea and upset stomach.  I did not add a codeine -based cough syrup because she is already using hydrocodone  and I do not want to increase her risk of falls and side effects.

## 2023-12-29 ENCOUNTER — Encounter: Payer: Self-pay | Admitting: Student in an Organized Health Care Education/Training Program

## 2023-12-29 ENCOUNTER — Ambulatory Visit (INDEPENDENT_AMBULATORY_CARE_PROVIDER_SITE_OTHER): Admitting: Student in an Organized Health Care Education/Training Program

## 2023-12-29 VITALS — BP 137/87 | HR 85 | Temp 98.1°F | Wt 130.0 lb

## 2023-12-29 DIAGNOSIS — R490 Dysphonia: Secondary | ICD-10-CM | POA: Diagnosis not present

## 2023-12-29 MED ORDER — FLUTICASONE PROPIONATE 50 MCG/ACT NA SUSP: 2.0000 | Freq: Every day | NASAL | 6 refills | Status: AC

## 2023-12-29 NOTE — Patient Instructions (Signed)
  VISIT SUMMARY: During today's visit, we discussed your persistent cough and hoarseness, which have been ongoing for about a month. Despite previous treatments for bronchitis and pneumonia, your symptoms have not improved. We also reviewed your history of rheumatoid arthritis and Sjogren's syndrome, which may be contributing to your prolonged recovery.  YOUR PLAN: -PERSISTENT COUGH AND LARYNGITIS: Your persistent cough and hoarseness are likely due to postnasal drip affecting your vocal cords, especially given your immunosuppressed state from Sjogren's treatment. We will start you on a nasal steroid spray to use twice daily, aiming towards your ear to reach the sinuses. We will reassess your symptoms in one to two weeks. If there is no improvement, we may refer you to an ENT specialist.  -COMMUNITY ACQUIRED PNEUMONIA: You were previously treated with antibiotics for pneumonia, and your lungs are currently clear with no symptoms present.  -SJOGREN'S SYNDROME: Sjogren's syndrome is an autoimmune disease that affects your moisture-producing glands and can lead to dryness in various parts of your body. Your treatment for this condition may be contributing to your prolonged recovery from infections.  -RHEUMATOID ARTHRITIS: Rheumatoid arthritis is an autoimmune disease that causes joint inflammation and pain. You are currently on a maintenance dose of prednisone  5 mg daily to manage this condition.  INSTRUCTIONS: Please return for a follow-up visit if your symptoms do not improve by next week.

## 2023-12-29 NOTE — Progress Notes (Signed)
   Acute Office Visit  Subjective:     Patient ID: Becky Boyd, female    DOB: 1956-06-01, 68 y.o.   MRN: [id number]  Chief Complaint  Patient presents with   Cough    While rooming patient can notice loss of voice    HPI  Discussed the use of AI scribe software for clinical note transcription with the patient, who gave verbal consent to proceed.  History of Present Illness Becky Boyd is a 68 year old female with rheumatoid arthritis and Sjogren's syndrome who presents with persistent cough and hoarseness.  She has been experiencing a persistent cough and hoarseness for approximately three to four weeks. Initially diagnosed with bronchitis at an after-hours clinic, she was prescribed Tessalon  and Prednisone  burst, which did not provide relief. Subsequently, she was treated with antibiotics for suspected pneumonia, which also did not alleviate her symptoms and caused diarrhea. Despite these treatments, her cough and hoarseness have persisted.  No fever or chills since the onset of symptoms. She experiences some sinus drainage but not in significant amounts. The cough is severe enough to wake her up at night and can be difficult to stop once it starts.  She has a history of rheumatoid arthritis and Sjogren's syndrome, for which she takes 5 mg of prednisone  daily. She recalls being given a 'douche pack' of prednisone  by the after-hours clinic prior to her last visit, which did not improve her symptoms.  She has never smoked and does not consume alcohol .      Objective:    BP 137/87   Pulse 85   Temp 98.1 F (36.7 C) (Temporal)   Wt 130 lb (59 kg)   SpO2 96%   BMI 25.18 kg/m    Physical Exam  Gen: Well-appearing woman with a hoarse voice Mouth: Crowded posterior oropharynx, no exudate, mild erythema Neck: No lymphadenopathy or tenderness Lungs: Unlabored, clear throughout with no crackles today Heart: Regular, no murmur Ext: Warm, no edema, varicose veins       Assessment & Plan:   Problem List Items Addressed This Visit       Unprioritized   Hoarseness of voice - Primary   Postinfectious laryngitis after a upper respiratory tract infection in a person with Sjogren's disease and chronic immunosuppression.  Last week I heard signs of crackles in her lungs and treated her for pneumonia.  Crackles are now resolved.  She has more sinus symptoms now with mild drainage.  I wonder if she is having postnasal drip contributing to the hoarse voice.  Chronic Sjogren's can also cause dryness which may be affecting her phonation.  I recommended we improve sinus hygiene, prescribed Flonase .  I counseled her that this can typically take about 2-3 weeks to resolve, it has been about 3 weeks.  If hoarse voice last longer than 4 weeks, I recommended referring her to ENT for laryngoscopy.  She declines that right now, wants to give the intranasal steroid another week and see if the hoarseness improves.      Relevant Medications   fluticasone  (FLONASE ) 50 MCG/ACT nasal spray    Meds ordered this encounter  Medications   fluticasone  (FLONASE ) 50 MCG/ACT nasal spray    Sig: Place 2 sprays into both nostrils daily.    Dispense:  16 g    Refill:  6    Return if symptoms worsen or fail to improve.  Cleatus Debby Specking, MD

## 2023-12-29 NOTE — Assessment & Plan Note (Signed)
 Postinfectious laryngitis after a upper respiratory tract infection in a person with Sjogren's disease and chronic immunosuppression.  Last week I heard signs of crackles in her lungs and treated her for pneumonia.  Crackles are now resolved.  She has more sinus symptoms now with mild drainage.  I wonder if she is having postnasal drip contributing to the hoarse voice.  Chronic Sjogren's can also cause dryness which may be affecting her phonation.  I recommended we improve sinus hygiene, prescribed Flonase .  I counseled her that this can typically take about 2-3 weeks to resolve, it has been about 3 weeks.  If hoarse voice last longer than 4 weeks, I recommended referring her to ENT for laryngoscopy.  She declines that right now, wants to give the intranasal steroid another week and see if the hoarseness improves.

## 2024-01-05 ENCOUNTER — Ambulatory Visit: Admitting: Student in an Organized Health Care Education/Training Program

## 2024-01-05 DIAGNOSIS — M961 Postlaminectomy syndrome, not elsewhere classified: Secondary | ICD-10-CM | POA: Diagnosis not present

## 2024-01-05 DIAGNOSIS — M3500 Sicca syndrome, unspecified: Secondary | ICD-10-CM | POA: Diagnosis not present

## 2024-01-05 DIAGNOSIS — Z79899 Other long term (current) drug therapy: Secondary | ICD-10-CM | POA: Diagnosis not present

## 2024-01-05 DIAGNOSIS — M0579 Rheumatoid arthritis with rheumatoid factor of multiple sites without organ or systems involvement: Secondary | ICD-10-CM | POA: Diagnosis not present

## 2024-01-05 DIAGNOSIS — N1831 Chronic kidney disease, stage 3a: Secondary | ICD-10-CM | POA: Diagnosis not present

## 2024-01-05 DIAGNOSIS — Z6824 Body mass index (BMI) 24.0-24.9, adult: Secondary | ICD-10-CM | POA: Diagnosis not present

## 2024-01-05 DIAGNOSIS — R42 Dizziness and giddiness: Secondary | ICD-10-CM | POA: Diagnosis not present

## 2024-01-05 DIAGNOSIS — R3129 Other microscopic hematuria: Secondary | ICD-10-CM | POA: Diagnosis not present

## 2024-01-05 DIAGNOSIS — Z96651 Presence of right artificial knee joint: Secondary | ICD-10-CM | POA: Diagnosis not present

## 2024-01-05 DIAGNOSIS — R499 Unspecified voice and resonance disorder: Secondary | ICD-10-CM | POA: Diagnosis not present

## 2024-01-05 DIAGNOSIS — M4322 Fusion of spine, cervical region: Secondary | ICD-10-CM | POA: Diagnosis not present

## 2024-01-10 ENCOUNTER — Other Ambulatory Visit: Payer: Self-pay | Admitting: Family Medicine

## 2024-01-12 NOTE — Telephone Encounter (Signed)
 Requested Prescriptions   Pending Prescriptions Disp Refills   baclofen  (LIORESAL ) 10 MG tablet [Pharmacy Med Name: Baclofen  Oral Tablet 10 MG] 90 tablet 0    Sig: TAKE ONE TABLET BY MOUTH THREE TIMES DAILY     Date of patient request: 01/12/2024 Last office visit: 06/09/2023 Upcoming visit: Visit date not found Date of last refill: 06/02/2023 Last refill amount: 90

## 2024-01-20 ENCOUNTER — Other Ambulatory Visit: Payer: Self-pay | Admitting: Family Medicine

## 2024-01-20 DIAGNOSIS — R053 Chronic cough: Secondary | ICD-10-CM

## 2024-02-04 DIAGNOSIS — M961 Postlaminectomy syndrome, not elsewhere classified: Secondary | ICD-10-CM | POA: Diagnosis not present

## 2024-02-04 DIAGNOSIS — Z6824 Body mass index (BMI) 24.0-24.9, adult: Secondary | ICD-10-CM | POA: Diagnosis not present

## 2024-02-04 DIAGNOSIS — Z96652 Presence of left artificial knee joint: Secondary | ICD-10-CM | POA: Diagnosis not present

## 2024-02-04 DIAGNOSIS — R42 Dizziness and giddiness: Secondary | ICD-10-CM | POA: Diagnosis not present

## 2024-02-04 DIAGNOSIS — M3500 Sicca syndrome, unspecified: Secondary | ICD-10-CM | POA: Diagnosis not present

## 2024-02-04 DIAGNOSIS — M0579 Rheumatoid arthritis with rheumatoid factor of multiple sites without organ or systems involvement: Secondary | ICD-10-CM | POA: Diagnosis not present

## 2024-02-04 DIAGNOSIS — M4322 Fusion of spine, cervical region: Secondary | ICD-10-CM | POA: Diagnosis not present

## 2024-02-04 DIAGNOSIS — N1831 Chronic kidney disease, stage 3a: Secondary | ICD-10-CM | POA: Diagnosis not present

## 2024-02-04 DIAGNOSIS — R499 Unspecified voice and resonance disorder: Secondary | ICD-10-CM | POA: Diagnosis not present

## 2024-02-04 DIAGNOSIS — Z96651 Presence of right artificial knee joint: Secondary | ICD-10-CM | POA: Diagnosis not present

## 2024-02-04 DIAGNOSIS — Z79899 Other long term (current) drug therapy: Secondary | ICD-10-CM | POA: Diagnosis not present

## 2024-02-10 ENCOUNTER — Other Ambulatory Visit: Payer: Self-pay | Admitting: Family Medicine

## 2024-02-13 ENCOUNTER — Other Ambulatory Visit: Payer: Self-pay | Admitting: Family Medicine

## 2024-02-13 DIAGNOSIS — E039 Hypothyroidism, unspecified: Secondary | ICD-10-CM

## 2024-02-16 DIAGNOSIS — R490 Dysphonia: Secondary | ICD-10-CM | POA: Diagnosis not present

## 2024-02-16 DIAGNOSIS — R053 Chronic cough: Secondary | ICD-10-CM | POA: Diagnosis not present

## 2024-03-05 DIAGNOSIS — M3500 Sicca syndrome, unspecified: Secondary | ICD-10-CM | POA: Diagnosis not present

## 2024-03-05 DIAGNOSIS — R42 Dizziness and giddiness: Secondary | ICD-10-CM | POA: Diagnosis not present

## 2024-03-05 DIAGNOSIS — Z23 Encounter for immunization: Secondary | ICD-10-CM | POA: Diagnosis not present

## 2024-03-05 DIAGNOSIS — Z6824 Body mass index (BMI) 24.0-24.9, adult: Secondary | ICD-10-CM | POA: Diagnosis not present

## 2024-03-05 DIAGNOSIS — Z79899 Other long term (current) drug therapy: Secondary | ICD-10-CM | POA: Diagnosis not present

## 2024-03-05 DIAGNOSIS — R499 Unspecified voice and resonance disorder: Secondary | ICD-10-CM | POA: Diagnosis not present

## 2024-03-05 DIAGNOSIS — M4322 Fusion of spine, cervical region: Secondary | ICD-10-CM | POA: Diagnosis not present

## 2024-03-05 DIAGNOSIS — M961 Postlaminectomy syndrome, not elsewhere classified: Secondary | ICD-10-CM | POA: Diagnosis not present

## 2024-03-05 DIAGNOSIS — M0579 Rheumatoid arthritis with rheumatoid factor of multiple sites without organ or systems involvement: Secondary | ICD-10-CM | POA: Diagnosis not present

## 2024-03-05 DIAGNOSIS — N1831 Chronic kidney disease, stage 3a: Secondary | ICD-10-CM | POA: Diagnosis not present

## 2024-03-09 DIAGNOSIS — M797 Fibromyalgia: Secondary | ICD-10-CM | POA: Diagnosis not present

## 2024-03-09 DIAGNOSIS — R29818 Other symptoms and signs involving the nervous system: Secondary | ICD-10-CM | POA: Diagnosis not present

## 2024-03-09 DIAGNOSIS — M0579 Rheumatoid arthritis with rheumatoid factor of multiple sites without organ or systems involvement: Secondary | ICD-10-CM | POA: Diagnosis not present

## 2024-03-09 DIAGNOSIS — M722 Plantar fascial fibromatosis: Secondary | ICD-10-CM | POA: Diagnosis not present

## 2024-03-09 DIAGNOSIS — L299 Pruritus, unspecified: Secondary | ICD-10-CM | POA: Diagnosis not present

## 2024-03-09 DIAGNOSIS — M5136 Other intervertebral disc degeneration, lumbar region with discogenic back pain only: Secondary | ICD-10-CM | POA: Diagnosis not present

## 2024-03-09 DIAGNOSIS — R252 Cramp and spasm: Secondary | ICD-10-CM | POA: Diagnosis not present

## 2024-03-09 DIAGNOSIS — Z79899 Other long term (current) drug therapy: Secondary | ICD-10-CM | POA: Diagnosis not present

## 2024-03-09 DIAGNOSIS — R490 Dysphonia: Secondary | ICD-10-CM | POA: Diagnosis not present

## 2024-03-09 DIAGNOSIS — M359 Systemic involvement of connective tissue, unspecified: Secondary | ICD-10-CM | POA: Diagnosis not present

## 2024-03-09 DIAGNOSIS — Z6823 Body mass index (BMI) 23.0-23.9, adult: Secondary | ICD-10-CM | POA: Diagnosis not present

## 2024-03-09 DIAGNOSIS — M25511 Pain in right shoulder: Secondary | ICD-10-CM | POA: Diagnosis not present

## 2024-03-16 ENCOUNTER — Ambulatory Visit (INDEPENDENT_AMBULATORY_CARE_PROVIDER_SITE_OTHER): Admitting: *Deleted

## 2024-03-16 VITALS — Ht 60.0 in | Wt 130.0 lb

## 2024-03-16 DIAGNOSIS — Z0000 Encounter for general adult medical examination without abnormal findings: Secondary | ICD-10-CM | POA: Diagnosis not present

## 2024-03-16 NOTE — Patient Instructions (Signed)
 Becky Boyd , Thank you for taking time to come for your Medicare Wellness Visit. I appreciate your ongoing commitment to your health goals. Please review the following plan we discussed and let me know if I can assist you in the future.   Screening recommendations/referrals: Colonoscopy: up to date Mammogram: up to date Bone Density: up todate Recommended yearly ophthalmology/optometry visit for glaucoma screening and checkup Recommended yearly dental visit for hygiene and checkup  Vaccinations: Influenza vaccine: up to date Pneumococcal vaccine: Education provided Tdap vaccine: Education provided Shingles vaccine: Education provided      Preventive Care 65 Years and Older, Female Preventive care refers to lifestyle choices and visits with your health care provider that can promote health and wellness. What does preventive care include? A yearly physical exam. This is also called an annual well check. Dental exams once or twice a year. Routine eye exams. Ask your health care provider how often you should have your eyes checked. Personal lifestyle choices, including: Daily care of your teeth and gums. Regular physical activity. Eating a healthy diet. Avoiding tobacco and drug use. Limiting alcohol  use. Practicing safe sex. Taking low-dose aspirin  every day. Taking vitamin and mineral supplements as recommended by your health care provider. What happens during an annual well check? The services and screenings done by your health care provider during your annual well check will depend on your age, overall health, lifestyle risk factors, and family history of disease. Counseling  Your health care provider may ask you questions about your: Alcohol  use. Tobacco use. Drug use. Emotional well-being. Home and relationship well-being. Sexual activity. Eating habits. History of falls. Memory and ability to understand (cognition). Work and work Astronomer. Reproductive  health. Screening  You may have the following tests or measurements: Height, weight, and BMI. Blood pressure. Lipid and cholesterol levels. These may be checked every 5 years, or more frequently if you are over 29 years old. Skin check. Lung cancer screening. You may have this screening every year starting at age 25 if you have a 30-pack-year history of smoking and currently smoke or have quit within the past 15 years. Fecal occult blood test (FOBT) of the stool. You may have this test every year starting at age 81. Flexible sigmoidoscopy or colonoscopy. You may have a sigmoidoscopy every 5 years or a colonoscopy every 10 years starting at age 33. Hepatitis C blood test. Hepatitis B blood test. Sexually transmitted disease (STD) testing. Diabetes screening. This is done by checking your blood sugar (glucose) after you have not eaten for a while (fasting). You may have this done every 1-3 years. Bone density scan. This is done to screen for osteoporosis. You may have this done starting at age 73. Mammogram. This may be done every 1-2 years. Talk to your health care provider about how often you should have regular mammograms. Talk with your health care provider about your test results, treatment options, and if necessary, the need for more tests. Vaccines  Your health care provider may recommend certain vaccines, such as: Influenza vaccine. This is recommended every year. Tetanus, diphtheria, and acellular pertussis (Tdap, Td) vaccine. You may need a Td booster every 10 years. Zoster vaccine. You may need this after age 53. Pneumococcal 13-valent conjugate (PCV13) vaccine. One dose is recommended after age 76. Pneumococcal polysaccharide (PPSV23) vaccine. One dose is recommended after age 71. Talk to your health care provider about which screenings and vaccines you need and how often you need them. This information is not intended  to replace advice given to you by your health care provider.  Make sure you discuss any questions you have with your health care provider. Document Released: 06/16/2015 Document Revised: 02/07/2016 Document Reviewed: 03/21/2015 Elsevier Interactive Patient Education  2017 [employer].  Fall Prevention in the Home Falls can cause injuries. They can happen to people of all ages. There are many things you can do to make your home safe and to help prevent falls. What can I do on the outside of my home? Regularly fix the edges of walkways and driveways and fix any cracks. Remove anything that might make you trip as you walk through a door, such as a raised step or threshold. Trim any bushes or trees on the path to your home. Use bright outdoor lighting. Clear any walking paths of anything that might make someone trip, such as rocks or tools. Regularly check to see if handrails are loose or broken. Make sure that both sides of any steps have handrails. Any raised decks and porches should have guardrails on the edges. Have any leaves, snow, or ice cleared regularly. Use sand or salt on walking paths during winter. Clean up any spills in your garage right away. This includes oil or grease spills. What can I do in the bathroom? Use night lights. Install grab bars by the toilet and in the tub and shower. Do not use towel bars as grab bars. Use non-skid mats or decals in the tub or shower. If you need to sit down in the shower, use a plastic, non-slip stool. Keep the floor dry. Clean up any water  that spills on the floor as soon as it happens. Remove soap buildup in the tub or shower regularly. Attach bath mats securely with double-sided non-slip rug tape. Do not have throw rugs and other things on the floor that can make you trip. What can I do in the bedroom? Use night lights. Make sure that you have a light by your bed that is easy to reach. Do not use any sheets or blankets that are too big for your bed. They should not hang down onto the floor. Have a  firm chair that has side arms. You can use this for support while you get dressed. Do not have throw rugs and other things on the floor that can make you trip. What can I do in the kitchen? Clean up any spills right away. Avoid walking on wet floors. Keep items that you use a lot in easy-to-reach places. If you need to reach something above you, use a strong step stool that has a grab bar. Keep electrical cords out of the way. Do not use floor polish or wax that makes floors slippery. If you must use wax, use non-skid floor wax. Do not have throw rugs and other things on the floor that can make you trip. What can I do with my stairs? Do not leave any items on the stairs. Make sure that there are handrails on both sides of the stairs and use them. Fix handrails that are broken or loose. Make sure that handrails are as long as the stairways. Check any carpeting to make sure that it is firmly attached to the stairs. Fix any carpet that is loose or worn. Avoid having throw rugs at the top or bottom of the stairs. If you do have throw rugs, attach them to the floor with carpet tape. Make sure that you have a light switch at the top of the stairs and  the bottom of the stairs. If you do not have them, ask someone to add them for you. What else can I do to help prevent falls? Wear shoes that: Do not have high heels. Have rubber bottoms. Are comfortable and fit you well. Are closed at the toe. Do not wear sandals. If you use a stepladder: Make sure that it is fully opened. Do not climb a closed stepladder. Make sure that both sides of the stepladder are locked into place. Ask someone to hold it for you, if possible. Clearly mark and make sure that you can see: Any grab bars or handrails. First and last steps. Where the edge of each step is. Use tools that help you move around (mobility aids) if they are needed. These include: Canes. Walkers. Scooters. Crutches. Turn on the lights when you  go into a dark area. Replace any light bulbs as soon as they burn out. Set up your furniture so you have a clear path. Avoid moving your furniture around. If any of your floors are uneven, fix them. If there are any pets around you, be aware of where they are. Review your medicines with your doctor. Some medicines can make you feel dizzy. This can increase your chance of falling. Ask your doctor what other things that you can do to help prevent falls. This information is not intended to replace advice given to you by your health care provider. Make sure you discuss any questions you have with your health care provider. Document Released: 03/16/2009 Document Revised: 10/26/2015 Document Reviewed: 06/24/2014 Elsevier Interactive Patient Education  2017 [employer].

## 2024-03-16 NOTE — Progress Notes (Signed)
 Subjective:   Becky Boyd is a 68 y.o. female who presents for Medicare Annual (Subsequent) preventive examination.  Visit Complete: Virtual I connected with  Rock Lever on 03/16/24 by a audio enabled telemedicine application and verified that I am speaking with the correct person using two identifiers.  Patient Location: Home  Provider Location: Home Office  I discussed the limitations of evaluation and management by telemedicine. The patient expressed understanding and agreed to proceed.  Vital Signs: Because this visit was a virtual/telehealth visit, some criteria may be missing or patient reported. Any vitals not documented were not able to be obtained and vitals that have been documented are patient reported. .  Cardiac Risk Factors include: advanced age (>74men, >30 women);family history of premature cardiovascular disease;obesity (BMI >30kg/m2)     Objective:    Today's Vitals   03/16/24 1013  Weight: 130 lb (59 kg)  Height: 5' (1.524 m)  PainSc: 3    Body mass index is 25.39 kg/m.     03/16/2024   10:18 AM 10/22/2023    6:50 AM 10/17/2023    2:26 PM 01/17/2022   10:16 AM 06/11/2020   10:00 AM 06/10/2020    8:47 AM 06/09/2020   11:00 PM  Advanced Directives  Does Patient Have a Medical Advance Directive? Yes Yes Yes Yes  Yes Yes  Type of Advance Directive Living will Healthcare Power of [place];Living will Healthcare Power of [place];Living will Healthcare Power of [place];Living will Healthcare Power of [place];Living will  Healthcare Power of [place];Living will  Does patient want to make changes to medical advance directive?  No - Patient declined   No - Patient declined    Copy of Healthcare Power of Attorney in Chart?  No - copy requested No - copy requested No - copy requested       Current Medications (verified) Outpatient Encounter Medications as of 03/16/2024  Medication Sig   aspirin  81 MG chewable tablet Chew 1 tablet (81 mg total) by mouth 2 (two)  times daily.   atorvastatin  (LIPITOR) 20 MG tablet TAKE ONE TABLET BY MOUTH ONCE A DAY   baclofen  (LIORESAL ) 10 MG tablet TAKE ONE TABLET BY MOUTH THREE TIMES DAILY   Carboxymethylcellul-Glycerin  (LUBRICATING EYE DROPS OP) Place 1 drop into both eyes daily as needed (dry eyes).   docusate sodium  (COLACE) 100 MG capsule Take 1 capsule (100 mg total) by mouth 2 (two) times daily.   DULoxetine  (CYMBALTA ) 60 MG capsule Take 60 mg by mouth at bedtime.   fluticasone  (FLONASE ) 50 MCG/ACT nasal spray Place 2 sprays into both nostrils daily.   HYDROcodone -acetaminophen  (NORCO) 7.5-325 MG tablet Take 1 tablet by mouth 2 (two) times daily as needed.   levothyroxine  (SYNTHROID ) 75 MCG tablet TAKE ONE TABLET BY MOUTH ONCE A DAY   MAGNESIUM  PO Take 1 tablet by mouth daily.   meloxicam (MOBIC) 15 MG tablet Take 15 mg by mouth daily.   naloxone (NARCAN) nasal spray 4 mg/0.1 mL SMARTSIG:Both Nares   pantoprazole  (PROTONIX ) 40 MG tablet TAKE ONE TABLET BY MOUTH ONCE A DAY   polyethylene glycol (MIRALAX  / GLYCOLAX ) 17 g packet Take 17 g by mouth daily.   predniSONE  (DELTASONE ) 5 MG tablet Take 5 mg by mouth daily.   sulfaSALAzine  (AZULFIDINE ) 500 MG tablet Take 1,000 mg by mouth 2 (two) times daily.   No facility-administered encounter medications on file as of 03/16/2024.    Allergies (verified) Plaquenil  [hydroxychloroquine  sulfate]   History: Past Medical History:  Diagnosis Date  Aneurysm    Anorexia    COVID    Dysphagia    Fibromyalgia    Fibromyalgia    GERD (gastroesophageal reflux disease)    Hashimoto thyroiditis    History of kidney stones    hx of multiple kidney stones    Hypothyroidism    Kidney stones    Lupus    Palpitations    Rheumatoid arthritis (HCC)    Sjogren's disease    TIA (transient ischemic attack)    once in 2020, no isses   Past Surgical History:  Procedure Laterality Date   ABDOMINAL HYSTERECTOMY     CERVICAL FUSION  2004   C4-C5 with Instrumentation    CERVICAL FUSION  1990   C3-C4   cervical rods     CHOLECYSTECTOMY     kidney stone     multiple   l5-s1 surgery     LUMBAR MICRODISCECTOMY  2004   Bilateral L5-S1   TOTAL KNEE ARTHROPLASTY Left 06/09/2020   Procedure: TOTAL KNEE ARTHROPLASTY;  Surgeon: Duwayne Purchase, MD;  Location: [facility];  Service: Orthopedics;  Laterality: Left;   TOTAL KNEE ARTHROPLASTY Right 10/22/2023   Procedure: ARTHROPLASTY, KNEE, TOTAL;  Surgeon: Duwayne Purchase, MD;  Location: [facility];  Service: Orthopedics;  Laterality: Right;  Right total knee arthroplasty, left knee exam under anesthesia, manipulation under anesthesia   Family History  Problem Relation Age of Onset   COPD Mother    Heart disease Father    Diabetes Sister    COPD Brother    Breast cancer Paternal Aunt    Aneurysm Paternal Aunt    Diabetes Other    Dementia Neg Hx    Alzheimer's disease Neg Hx    Social History   Socioeconomic History   Marital status: Married    Spouse name: Not on file   Number of children: 2   Years of education: Not on file   Highest education level: High school graduate  Occupational History   Not on file  Tobacco Use   Smoking status: Never   Smokeless tobacco: Never  Vaping Use   Vaping status: Never Used  Substance and Sexual Activity   Alcohol  use: Never   Drug use: Never   Sexual activity: Not Currently  Other Topics Concern   Not on file  Social History Narrative   Lives at home with her husband   Right handed   Caffeine: occasional pepsi or mtn dew   Social Drivers of Health   Financial Resource Strain: Low Risk  (03/16/2024)   Overall Financial Resource Strain (CARDIA)    Difficulty of Paying Living Expenses: Not hard at all  Food Insecurity: No Food Insecurity (03/16/2024)   Hunger Vital Sign    Worried About Running Out of Food in the Last Year: Never true    Ran Out of Food in the Last Year: Never true  Transportation Needs: No Transportation Needs (03/16/2024)   PRAPARE -  Administrator, Civil Service (Medical): No    Lack of Transportation (Non-Medical): No  Physical Activity: Insufficiently Active (03/16/2024)   Exercise Vital Sign    Days of Exercise per Week: 3 days    Minutes of Exercise per Session: 20 min  Stress: No Stress Concern Present (03/16/2024)   [employer] of Occupational Health - Occupational Stress Questionnaire    Feeling of Stress: Not at all  Social Connections: Moderately Isolated (03/16/2024)   Social Connection and Isolation Panel    Frequency of  Communication with Friends and Family: More than three times a week    Frequency of Social Gatherings with Friends and Family: Once a week    Attends Religious Services: Never    Database administrator or Organizations: No    Attends Engineer, structural: Never    Marital Status: Married    Tobacco Counseling Counseling given: Not Answered   Clinical Intake:  Pre-visit preparation completed: Yes  Pain : 0-10 Pain Score: 3  Pain Location: Back Pain Descriptors / Indicators: Aching, Dull Pain Onset: More than a month ago Pain Frequency: Constant     Diabetes: No  How often do you need to have someone help you when you read instructions, pamphlets, or other written materials from your doctor or pharmacy?: 1 - Never  Interpreter Needed?: No  Information entered by :: Mliss Graff LPN   Activities of Daily Living    03/16/2024   10:18 AM 10/22/2023    6:36 AM  In your present state of health, do you have any difficulty performing the following activities:  Hearing? 0 0  Vision? 0 0  Difficulty concentrating or making decisions? 0 0  Walking or climbing stairs? 0   Dressing or bathing? 0   Doing errands, shopping? 0   Preparing Food and eating ? N   Using the Toilet? N   In the past six months, have you accidently leaked urine? N   Do you have problems with loss of bowel control? N   Managing your Medications? N   Managing your Finances?  N   Housekeeping or managing your Housekeeping? N     Patient Care Team: Mahlon Comer BRAVO, MD as PCP - General (Family Medicine) Mai Lynwood FALCON, MD as Consulting Physician (Rheumatology) Ines Onetha NOVAK, MD as Consulting Physician (Neurology)  Indicate any recent Medical Services you may have received from other than [facility] providers in the past year (date may be approximate).     Assessment:   This is a routine wellness examination for Kinney.  Hearing/Vision screen Hearing Screening - Comments:: No trouble hearing Vision Screening - Comments:: Up to date [facility]   Goals Addressed             This Visit's Progress    Patient Stated       Continue current lifestyle        Depression Screen    03/16/2024   10:19 AM 06/09/2023    2:50 PM 01/23/2023    2:39 PM 03/19/2022   10:13 AM 01/17/2022   10:17 AM 01/17/2022   10:14 AM 05/21/2021   11:13 AM  PHQ 2/9 Scores  PHQ - 2 Score 0 0 0 0 0 0 0  PHQ- 9 Score 3 2 3 1   6     Fall Risk    03/16/2024   10:14 AM 06/09/2023    2:45 PM 01/23/2023    2:34 PM 07/24/2022    1:56 PM 03/19/2022   10:13 AM  Fall Risk   Falls in the past year? 0 0 1 1 0  Number falls in past yr: 0 0 1 0   Injury with Fall? 0 0 0 0   Risk for fall due to :  No Fall Risks  History of fall(s) No Fall Risks  Follow up Falls evaluation completed;Education provided;Falls prevention discussed Falls evaluation completed Falls evaluation completed;Education provided;Falls prevention discussed Falls evaluation completed Falls evaluation completed      Data saved with a previous flowsheet  row definition    MEDICARE RISK AT HOME: Medicare Risk at Home Any stairs in or around the home?: Yes Home free of loose throw rugs in walkways, pet beds, electrical cords, etc?: Yes Adequate lighting in your home to reduce risk of falls?: Yes Life alert?: No Use of a cane, walker or w/c?: No Grab bars in the bathroom?: Yes Shower chair or bench in shower?:  Yes Elevated toilet seat or a handicapped toilet?: No  TIMED UP AND GO:  Was the test performed?  No    Cognitive Function:    09/26/2022    3:17 PM 09/26/2022    3:15 PM 03/11/2022   11:07 AM 05/03/2021    4:19 PM 10/08/2017   12:52 PM  MMSE - Mini Mental State Exam  Orientation to time 5 5 5 5 4   Orientation to Place 4 4 4 5 5   Registration 3  3 3 3   Attention/ Calculation 5  2 4 5   Recall 3  3 2 3   Language- name 2 objects 2  2 2 2   Language- repeat 1  1 1 1   Language- follow 3 step command 2  3 3 3   Language- read & follow direction 1  1 1 1   Write a sentence 1  1 1 1   Copy design 1  0 1 1  Total score 28  25 28 29         03/16/2024   10:16 AM 01/23/2023    2:41 PM 01/17/2022   10:27 AM  6CIT Screen  What Year? 0 points 0 points 0 points  What month? 0 points 0 points 0 points  What time? 0 points 0 points 0 points  Count back from 20 0 points 0 points 0 points  Months in reverse 0 points 0 points 0 points  Repeat phrase 0 points 0 points 0 points  Total Score 0 points 0 points 0 points    Immunizations Immunization History  Administered Date(s) Administered   Fluad Quad(high Dose 65+) 02/17/2020, 03/10/2024   Influenza, Seasonal, Injecte, Preservative Fre 07/04/2016   Influenza,inj,Quad PF,6+ Mos 04/22/2013, 04/01/2014, 01/27/2017, 03/04/2019, 05/10/2023   Influenza-Unspecified 03/31/2021   Moderna Covid-19 Fall Seasonal Vaccine 24yrs & older 03/01/2023, 03/01/2023   Moderna Covid-19 Vaccine Bivalent Booster 102yrs & up 05/23/2022   Moderna SARS-COV2 Booster Vaccination 09/21/2020, 03/31/2021   Moderna Sars-Covid-2 Vaccination 07/09/2019, 08/05/2019, 04/28/2020   Pfizer(Comirnaty)Fall Seasonal Vaccine 12 years and older 05/23/2022   Zoster Recombinant(Shingrix) 02/19/2021    TDAP status: Due, Education has been provided regarding the importance of this vaccine. Advised may receive this vaccine at local pharmacy or Health Dept. Aware to provide a copy of the  vaccination record if obtained from local pharmacy or Health Dept. Verbalized acceptance and understanding.  Flu Vaccine status: Up to date  Pneumococcal vaccine status: Due, Education has been provided regarding the importance of this vaccine. Advised may receive this vaccine at local pharmacy or Health Dept. Aware to provide a copy of the vaccination record if obtained from local pharmacy or Health Dept. Verbalized acceptance and understanding.  Covid-19 vaccine status: Information provided on how to obtain vaccines.   Qualifies for Shingles Vaccine? Yes   Zostavax completed No   Shingrix Completed?: No.    Education has been provided regarding the importance of this vaccine. Patient has been advised to call insurance company to determine out of pocket expense if they have not yet received this vaccine. Advised may also receive vaccine at local pharmacy or  Health Dept. Verbalized acceptance and understanding.  Screening Tests Health Maintenance  Topic Date Due   Mammogram  Never done   DTaP/Tdap/Td (1 - Tdap) Never done   Pneumococcal Vaccine: 50+ Years (1 of 1 - PCV) Never done   Zoster Vaccines- Shingrix (2 of 2) 04/16/2021   Colonoscopy  05/20/2023   COVID-19 Vaccine (6 - 2025-26 season) 02/02/2024   Fecal DNA (Cologuard)  09/04/2024   Medicare Annual Wellness (AWV)  03/16/2025   Influenza Vaccine  Completed   DEXA SCAN  Completed   Hepatitis C Screening  Completed   Meningococcal B Vaccine  Aged Out    Health Maintenance  Health Maintenance Due  Topic Date Due   Mammogram  Never done   DTaP/Tdap/Td (1 - Tdap) Never done   Pneumococcal Vaccine: 50+ Years (1 of 1 - PCV) Never done   Zoster Vaccines- Shingrix (2 of 2) 04/16/2021   Colonoscopy  05/20/2023   COVID-19 Vaccine (6 - 2025-26 season) 02/02/2024    Colorectal cancer screening: Type of screening: Cologuard. Completed 2023. Repeat every 3 years  Mammogram status: Completed  . Repeat every year  Bone Density  status: Completed 2023. Results reflect: Bone density results: NORMAL. Repeat every 3 years.  Lung Cancer Screening: (Low Dose CT Chest recommended if Age 57-80 years, 20 pack-year currently smoking OR have quit w/in 15years.) does not qualify.   Lung Cancer Screening Referral:   Additional Screening:  Hepatitis C Screening: does not qualify; Completed 2022  Vision Screening: Recommended annual ophthalmology exams for early detection of glaucoma and other disorders of the eye. Is the patient up to date with their annual eye exam?  Yes  Who is the provider or what is the name of the office in which the patient attends annual eye exams? [facility] If pt is not established with a provider, would they like to be referred to a provider to establish care? No .   Dental Screening: Recommended annual dental exams for proper oral hygiene  Community Resource Referral / Chronic Care Management: CRR required this visit?  No   CCM required this visit?  No     Plan:     I have personally reviewed and noted the following in the patient's chart:   Medical and social history Use of alcohol , tobacco or illicit drugs  Current medications and supplements including opioid prescriptions. Patient is currently taking opioid prescriptions. Information provided to patient regarding non-opioid alternatives. Patient advised to discuss non-opioid treatment plan with their provider. Functional ability and status Nutritional status Physical activity Advanced directives List of other physicians Hospitalizations, surgeries, and ER visits in previous 12 months Vitals Screenings to include cognitive, depression, and falls Referrals and appointments  In addition, I have reviewed and discussed with patient certain preventive protocols, quality metrics, and best practice recommendations. A written personalized care plan for preventive services as well as general preventive health recommendations were provided to  patient.     Mliss Graff, LPN   [id number]   After Visit Summary: (MyChart) Due to this being a telephonic visit, the after visit summary with patients personalized plan was offered to patient via MyChart   Nurse Notes:

## 2024-03-23 DIAGNOSIS — R490 Dysphonia: Secondary | ICD-10-CM | POA: Diagnosis not present

## 2024-03-23 DIAGNOSIS — R053 Chronic cough: Secondary | ICD-10-CM | POA: Diagnosis not present

## 2024-03-26 DIAGNOSIS — R059 Cough, unspecified: Secondary | ICD-10-CM | POA: Diagnosis not present

## 2024-03-30 NOTE — Progress Notes (Signed)
 Becky Boyd                                          MRN: [id number]   03/30/2024   The [facility] Quality Team Specialist reviewed this patient medical record for the purposes of chart review for care gap closure. The following were reviewed: chart review for care gap closure-breast cancer screening and colorectal cancer screening.    [facility] Quality Team

## 2024-04-24 ENCOUNTER — Other Ambulatory Visit: Payer: Self-pay | Admitting: Family Medicine

## 2024-04-24 DIAGNOSIS — R053 Chronic cough: Secondary | ICD-10-CM

## 2024-05-16 ENCOUNTER — Other Ambulatory Visit: Payer: Self-pay | Admitting: Family Medicine

## 2024-05-16 DIAGNOSIS — E039 Hypothyroidism, unspecified: Secondary | ICD-10-CM

## 2024-05-25 ENCOUNTER — Ambulatory Visit: Admitting: Family Medicine

## 2024-05-25 ENCOUNTER — Encounter: Payer: Self-pay | Admitting: Family Medicine

## 2024-05-25 VITALS — BP 138/84 | HR 77 | Temp 98.2°F | Resp 16 | Ht 60.0 in | Wt 138.4 lb

## 2024-05-25 DIAGNOSIS — M722 Plantar fascial fibromatosis: Secondary | ICD-10-CM | POA: Insufficient documentation

## 2024-05-25 DIAGNOSIS — E039 Hypothyroidism, unspecified: Secondary | ICD-10-CM | POA: Diagnosis not present

## 2024-05-25 DIAGNOSIS — E785 Hyperlipidemia, unspecified: Secondary | ICD-10-CM | POA: Diagnosis not present

## 2024-05-25 DIAGNOSIS — M62838 Other muscle spasm: Secondary | ICD-10-CM | POA: Insufficient documentation

## 2024-05-25 DIAGNOSIS — E663 Overweight: Secondary | ICD-10-CM

## 2024-05-25 DIAGNOSIS — M359 Systemic involvement of connective tissue, unspecified: Secondary | ICD-10-CM | POA: Insufficient documentation

## 2024-05-25 DIAGNOSIS — M069 Rheumatoid arthritis, unspecified: Secondary | ICD-10-CM | POA: Diagnosis not present

## 2024-05-25 LAB — CBC WITH DIFFERENTIAL/PLATELET
Basophils Absolute: 0.1 K/uL (ref 0.0–0.1)
Basophils Relative: 1.2 % (ref 0.0–3.0)
Eosinophils Absolute: 0.6 K/uL (ref 0.0–0.7)
Eosinophils Relative: 8.1 % — ABNORMAL HIGH (ref 0.0–5.0)
HCT: 36.2 % (ref 36.0–46.0)
Hemoglobin: 12 g/dL (ref 12.0–15.0)
Lymphocytes Relative: 20.3 % (ref 12.0–46.0)
Lymphs Abs: 1.6 K/uL (ref 0.7–4.0)
MCHC: 33.1 g/dL (ref 30.0–36.0)
MCV: 99.3 fl (ref 78.0–100.0)
Monocytes Absolute: 0.7 K/uL (ref 0.1–1.0)
Monocytes Relative: 8.8 % (ref 3.0–12.0)
Neutro Abs: 4.9 K/uL (ref 1.4–7.7)
Neutrophils Relative %: 61.6 % (ref 43.0–77.0)
Platelets: 224 K/uL (ref 150.0–400.0)
RBC: 3.65 Mil/uL — ABNORMAL LOW (ref 3.87–5.11)
RDW: 13.6 % (ref 11.5–15.5)
WBC: 7.9 K/uL (ref 4.0–10.5)

## 2024-05-25 LAB — BASIC METABOLIC PANEL WITH GFR
BUN: 14 mg/dL (ref 6–23)
CO2: 29 meq/L (ref 19–32)
Calcium: 9.2 mg/dL (ref 8.4–10.5)
Chloride: 102 meq/L (ref 96–112)
Creatinine, Ser: 0.85 mg/dL (ref 0.40–1.20)
GFR: 69.41 mL/min
Glucose, Bld: 87 mg/dL (ref 70–99)
Potassium: 3.8 meq/L (ref 3.5–5.1)
Sodium: 139 meq/L (ref 135–145)

## 2024-05-25 LAB — HEPATIC FUNCTION PANEL
ALT: 10 U/L (ref 3–35)
AST: 15 U/L (ref 5–37)
Albumin: 4.2 g/dL (ref 3.5–5.2)
Alkaline Phosphatase: 81 U/L (ref 39–117)
Bilirubin, Direct: 0.1 mg/dL (ref 0.1–0.3)
Total Bilirubin: 0.4 mg/dL (ref 0.2–1.2)
Total Protein: 6.6 g/dL (ref 6.0–8.3)

## 2024-05-25 LAB — LIPID PANEL
Cholesterol: 168 mg/dL (ref 28–200)
HDL: 66.3 mg/dL
LDL Cholesterol: 75 mg/dL (ref 10–99)
NonHDL: 101.47
Total CHOL/HDL Ratio: 3
Triglycerides: 131 mg/dL (ref 10.0–149.0)
VLDL: 26.2 mg/dL (ref 0.0–40.0)

## 2024-05-25 LAB — TSH: TSH: 4.79 u[IU]/mL (ref 0.35–5.50)

## 2024-05-25 MED ORDER — ATORVASTATIN CALCIUM 20 MG PO TABS: 20.0000 mg | ORAL_TABLET | Freq: Every day | ORAL | 0 refills | Status: AC

## 2024-05-25 MED ORDER — LEVOTHYROXINE SODIUM 75 MCG PO TABS: 75.0000 ug | ORAL_TABLET | Freq: Every day | ORAL | 0 refills | Status: AC

## 2024-05-25 NOTE — Assessment & Plan Note (Signed)
 Chronic problem.  On Levothyroxine  75mcg daily.  Continues to struggle w/ fatigue but no changes to skin/hair/nails.  Check labs.  Adjust meds prn

## 2024-05-25 NOTE — Assessment & Plan Note (Signed)
Chronic problem.  On Lipitor 20mg daily w/o difficulty.  Check labs.  Adjust meds prn  

## 2024-05-25 NOTE — Patient Instructions (Signed)
Schedule your complete physical in 6 months We'll notify you of your lab results and make any changes if needed Continue to work on healthy diet and regular exercise- you look great! Call with any questions or concerns Stay Safe!  Stay Healthy! Happy Holidays!!! 

## 2024-05-25 NOTE — Progress Notes (Signed)
" ° °  Subjective:    Patient ID: Becky Boyd, female    DOB: Oct 12, 1955, 68 y.o.   MRN: [id number]  HPI Hyperlipidemia- chronic problem, on Lipitor 20mg  daily.  No CP, SOB, abd pain, N/V.  Hypothyroid- chronic problem, on Levothyroxine  75mcg daily.  + fatigue.  No changes to skin/hair/nails  Overweight- pt has gained 8 lbs in the last year.  BMI now 27.  Active but no regular exercise.   Review of Systems For ROS see HPI     Objective:   Physical Exam Vitals reviewed.  Constitutional:      General: She is not in acute distress.    Appearance: Normal appearance. She is well-developed. She is not ill-appearing.  HENT:     Head: Normocephalic and atraumatic.  Eyes:     Conjunctiva/sclera: Conjunctivae normal.     Pupils: Pupils are equal, round, and reactive to light.  Neck:     Thyroid : No thyromegaly.  Cardiovascular:     Rate and Rhythm: Normal rate and regular rhythm.     Pulses: Normal pulses.     Heart sounds: Normal heart sounds. No murmur heard. Pulmonary:     Effort: Pulmonary effort is normal. No respiratory distress.     Breath sounds: Normal breath sounds.  Abdominal:     General: There is no distension.     Palpations: Abdomen is soft.     Tenderness: There is no abdominal tenderness.  Musculoskeletal:     Cervical back: Normal range of motion and neck supple.     Right lower leg: No edema.     Left lower leg: No edema.  Lymphadenopathy:     Cervical: No cervical adenopathy.  Skin:    General: Skin is warm and dry.  Neurological:     General: No focal deficit present.     Mental Status: She is alert and oriented to person, place, and time.  Psychiatric:        Mood and Affect: Mood normal.        Behavior: Behavior normal.        Thought Content: Thought content normal.           Assessment & Plan:    "

## 2024-05-25 NOTE — Assessment & Plan Note (Signed)
 Pt has gained 8 lbs in the last year and BMI now 27.  She is very active but no specific exercise.  Check labs to risk stratify.  Will follow.

## 2024-05-25 NOTE — Assessment & Plan Note (Signed)
 Following w/ Rheumatology regularly.

## 2024-05-28 ENCOUNTER — Ambulatory Visit: Payer: Self-pay | Admitting: Family Medicine

## 2024-05-28 NOTE — Progress Notes (Signed)
 Lab results have been discussed.   Verbalized understanding? Yes  Are there any questions? No

## 2024-11-30 ENCOUNTER — Encounter: Admitting: Family Medicine

## 2025-04-05 ENCOUNTER — Ambulatory Visit
